# Patient Record
Sex: Male | Born: 1935 | Race: White | Hispanic: No | Marital: Single | Smoking: Former smoker
Health system: Southern US, Community
[De-identification: ages and names within clinical notes are randomized; demographics above are authoritative.]

## PROBLEM LIST (undated history)

## (undated) DIAGNOSIS — I214 Non-ST elevation (NSTEMI) myocardial infarction: Secondary | ICD-10-CM

## (undated) DIAGNOSIS — R351 Nocturia: Secondary | ICD-10-CM

## (undated) DIAGNOSIS — R519 Headache, unspecified: Secondary | ICD-10-CM

## (undated) DIAGNOSIS — M47812 Spondylosis without myelopathy or radiculopathy, cervical region: Secondary | ICD-10-CM

## (undated) DIAGNOSIS — N3281 Overactive bladder: Secondary | ICD-10-CM

## (undated) DIAGNOSIS — G629 Polyneuropathy, unspecified: Secondary | ICD-10-CM

## (undated) DIAGNOSIS — N401 Enlarged prostate with lower urinary tract symptoms: Secondary | ICD-10-CM

## (undated) DIAGNOSIS — F0390 Unspecified dementia without behavioral disturbance: Secondary | ICD-10-CM

## (undated) DIAGNOSIS — R51 Headache: Secondary | ICD-10-CM

## (undated) DIAGNOSIS — I5032 Chronic diastolic (congestive) heart failure: Secondary | ICD-10-CM

## (undated) DIAGNOSIS — M6281 Muscle weakness (generalized): Secondary | ICD-10-CM

## (undated) DIAGNOSIS — I509 Heart failure, unspecified: Secondary | ICD-10-CM

## (undated) DIAGNOSIS — D649 Anemia, unspecified: Secondary | ICD-10-CM

## (undated) DIAGNOSIS — K219 Gastro-esophageal reflux disease without esophagitis: Secondary | ICD-10-CM

## (undated) DIAGNOSIS — G8929 Other chronic pain: Secondary | ICD-10-CM

## (undated) DIAGNOSIS — M479 Spondylosis, unspecified: Secondary | ICD-10-CM

## (undated) DIAGNOSIS — N183 Chronic kidney disease, stage 3 (moderate): Secondary | ICD-10-CM

## (undated) DIAGNOSIS — N138 Other obstructive and reflux uropathy: Secondary | ICD-10-CM

## (undated) DIAGNOSIS — I10 Essential (primary) hypertension: Secondary | ICD-10-CM

## (undated) DIAGNOSIS — I639 Cerebral infarction, unspecified: Secondary | ICD-10-CM

## (undated) DIAGNOSIS — M1990 Unspecified osteoarthritis, unspecified site: Secondary | ICD-10-CM

## (undated) DIAGNOSIS — M549 Dorsalgia, unspecified: Secondary | ICD-10-CM

## (undated) DIAGNOSIS — Z9181 History of falling: Secondary | ICD-10-CM

## (undated) DIAGNOSIS — R32 Unspecified urinary incontinence: Secondary | ICD-10-CM

## (undated) DIAGNOSIS — F32A Depression, unspecified: Secondary | ICD-10-CM

## (undated) DIAGNOSIS — C4491 Basal cell carcinoma of skin, unspecified: Secondary | ICD-10-CM

## (undated) DIAGNOSIS — I872 Venous insufficiency (chronic) (peripheral): Secondary | ICD-10-CM

## (undated) DIAGNOSIS — M25512 Pain in left shoulder: Principal | ICD-10-CM

## (undated) DIAGNOSIS — F329 Major depressive disorder, single episode, unspecified: Secondary | ICD-10-CM

## (undated) DIAGNOSIS — R42 Dizziness and giddiness: Secondary | ICD-10-CM

## (undated) DIAGNOSIS — I4891 Unspecified atrial fibrillation: Secondary | ICD-10-CM

## (undated) DIAGNOSIS — H409 Unspecified glaucoma: Secondary | ICD-10-CM

## (undated) HISTORY — DX: Non-ST elevation (NSTEMI) myocardial infarction: I21.4

## (undated) HISTORY — DX: Polyneuropathy, unspecified: G62.9

## (undated) HISTORY — DX: Benign prostatic hyperplasia with lower urinary tract symptoms: N13.8

## (undated) HISTORY — DX: Spondylosis without myelopathy or radiculopathy, cervical region: M47.812

## (undated) HISTORY — DX: Unspecified osteoarthritis, unspecified site: M19.90

## (undated) HISTORY — DX: Unspecified urinary incontinence: R32

## (undated) HISTORY — DX: Overactive bladder: N32.81

## (undated) HISTORY — DX: Other chronic pain: G89.29

## (undated) HISTORY — PX: CHOLECYSTECTOMY: SHX55

## (undated) HISTORY — PX: TONSILECTOMY, ADENOIDECTOMY, BILATERAL MYRINGOTOMY AND TUBES: SHX2538

## (undated) HISTORY — DX: Depression, unspecified: F32.A

## (undated) HISTORY — DX: Venous insufficiency (chronic) (peripheral): I87.2

## (undated) HISTORY — DX: Headache, unspecified: R51.9

## (undated) HISTORY — DX: Chronic diastolic (congestive) heart failure: I50.32

## (undated) HISTORY — DX: Unspecified glaucoma: H40.9

## (undated) HISTORY — DX: Pain in left shoulder: M25.512

## (undated) HISTORY — DX: Chronic kidney disease, stage 3 (moderate): N18.3

## (undated) HISTORY — DX: Basal cell carcinoma of skin, unspecified: C44.91

## (undated) HISTORY — DX: Anemia, unspecified: D64.9

## (undated) HISTORY — DX: Headache: R51

## (undated) HISTORY — DX: Essential (primary) hypertension: I10

## (undated) HISTORY — DX: Cerebral infarction, unspecified: I63.9

## (undated) HISTORY — DX: Benign prostatic hyperplasia with lower urinary tract symptoms: N40.1

## (undated) HISTORY — DX: Unspecified dementia without behavioral disturbance: F03.90

## (undated) HISTORY — DX: Major depressive disorder, single episode, unspecified: F32.9

---

## 2011-06-20 DIAGNOSIS — G629 Polyneuropathy, unspecified: Secondary | ICD-10-CM

## 2011-06-20 DIAGNOSIS — I639 Cerebral infarction, unspecified: Secondary | ICD-10-CM

## 2011-06-20 DIAGNOSIS — I214 Non-ST elevation (NSTEMI) myocardial infarction: Secondary | ICD-10-CM

## 2011-06-20 DIAGNOSIS — N183 Chronic kidney disease, stage 3 unspecified: Secondary | ICD-10-CM

## 2011-06-20 HISTORY — DX: Cerebral infarction, unspecified: I63.9

## 2011-06-20 HISTORY — DX: Polyneuropathy, unspecified: G62.9

## 2011-06-20 HISTORY — DX: Non-ST elevation (NSTEMI) myocardial infarction: I21.4

## 2011-06-20 HISTORY — DX: Chronic kidney disease, stage 3 unspecified: N18.30

## 2011-06-22 DIAGNOSIS — M19079 Primary osteoarthritis, unspecified ankle and foot: Secondary | ICD-10-CM | POA: Diagnosis not present

## 2011-06-22 DIAGNOSIS — M79609 Pain in unspecified limb: Secondary | ICD-10-CM | POA: Diagnosis not present

## 2011-06-22 DIAGNOSIS — L600 Ingrowing nail: Secondary | ICD-10-CM | POA: Diagnosis not present

## 2011-06-22 DIAGNOSIS — R609 Edema, unspecified: Secondary | ICD-10-CM | POA: Diagnosis not present

## 2011-06-27 DIAGNOSIS — I10 Essential (primary) hypertension: Secondary | ICD-10-CM | POA: Diagnosis not present

## 2011-06-27 DIAGNOSIS — D649 Anemia, unspecified: Secondary | ICD-10-CM | POA: Diagnosis not present

## 2011-06-27 DIAGNOSIS — M1990 Unspecified osteoarthritis, unspecified site: Secondary | ICD-10-CM | POA: Diagnosis not present

## 2011-07-07 DIAGNOSIS — R42 Dizziness and giddiness: Secondary | ICD-10-CM | POA: Diagnosis not present

## 2011-07-07 DIAGNOSIS — M47817 Spondylosis without myelopathy or radiculopathy, lumbosacral region: Secondary | ICD-10-CM | POA: Diagnosis not present

## 2011-07-07 DIAGNOSIS — F17200 Nicotine dependence, unspecified, uncomplicated: Secondary | ICD-10-CM | POA: Diagnosis not present

## 2011-07-07 DIAGNOSIS — I214 Non-ST elevation (NSTEMI) myocardial infarction: Secondary | ICD-10-CM | POA: Diagnosis present

## 2011-07-07 DIAGNOSIS — I2589 Other forms of chronic ischemic heart disease: Secondary | ICD-10-CM | POA: Diagnosis not present

## 2011-07-07 DIAGNOSIS — R9431 Abnormal electrocardiogram [ECG] [EKG]: Secondary | ICD-10-CM | POA: Diagnosis not present

## 2011-07-07 DIAGNOSIS — S0990XA Unspecified injury of head, initial encounter: Secondary | ICD-10-CM | POA: Diagnosis not present

## 2011-07-07 DIAGNOSIS — IMO0002 Reserved for concepts with insufficient information to code with codable children: Secondary | ICD-10-CM | POA: Diagnosis not present

## 2011-07-07 DIAGNOSIS — W19XXXA Unspecified fall, initial encounter: Secondary | ICD-10-CM | POA: Diagnosis not present

## 2011-07-07 DIAGNOSIS — Z66 Do not resuscitate: Secondary | ICD-10-CM | POA: Diagnosis present

## 2011-07-07 DIAGNOSIS — R55 Syncope and collapse: Secondary | ICD-10-CM | POA: Diagnosis not present

## 2011-07-07 DIAGNOSIS — R0602 Shortness of breath: Secondary | ICD-10-CM | POA: Diagnosis not present

## 2011-07-07 DIAGNOSIS — R404 Transient alteration of awareness: Secondary | ICD-10-CM | POA: Diagnosis not present

## 2011-07-07 DIAGNOSIS — I10 Essential (primary) hypertension: Secondary | ICD-10-CM | POA: Diagnosis not present

## 2011-07-10 DIAGNOSIS — R5383 Other fatigue: Secondary | ICD-10-CM | POA: Diagnosis not present

## 2011-07-10 DIAGNOSIS — R0602 Shortness of breath: Secondary | ICD-10-CM | POA: Diagnosis not present

## 2011-07-10 DIAGNOSIS — R05 Cough: Secondary | ICD-10-CM | POA: Diagnosis not present

## 2011-07-10 DIAGNOSIS — I10 Essential (primary) hypertension: Secondary | ICD-10-CM | POA: Diagnosis not present

## 2011-07-10 DIAGNOSIS — R5381 Other malaise: Secondary | ICD-10-CM | POA: Diagnosis not present

## 2011-07-10 DIAGNOSIS — R0789 Other chest pain: Secondary | ICD-10-CM | POA: Diagnosis not present

## 2011-07-10 DIAGNOSIS — R51 Headache: Secondary | ICD-10-CM | POA: Diagnosis not present

## 2011-07-14 DIAGNOSIS — I10 Essential (primary) hypertension: Secondary | ICD-10-CM | POA: Diagnosis not present

## 2011-07-25 DIAGNOSIS — L578 Other skin changes due to chronic exposure to nonionizing radiation: Secondary | ICD-10-CM | POA: Diagnosis not present

## 2011-07-25 DIAGNOSIS — D0420 Carcinoma in situ of skin of unspecified ear and external auricular canal: Secondary | ICD-10-CM | POA: Diagnosis not present

## 2011-07-25 DIAGNOSIS — L570 Actinic keratosis: Secondary | ICD-10-CM | POA: Diagnosis not present

## 2011-07-25 DIAGNOSIS — L738 Other specified follicular disorders: Secondary | ICD-10-CM | POA: Diagnosis not present

## 2011-07-25 DIAGNOSIS — C4441 Basal cell carcinoma of skin of scalp and neck: Secondary | ICD-10-CM | POA: Diagnosis not present

## 2011-07-25 DIAGNOSIS — D485 Neoplasm of uncertain behavior of skin: Secondary | ICD-10-CM | POA: Diagnosis not present

## 2011-07-27 DIAGNOSIS — H353290 Exudative age-related macular degeneration, unspecified eye, stage unspecified: Secondary | ICD-10-CM | POA: Diagnosis not present

## 2011-08-04 DIAGNOSIS — F17200 Nicotine dependence, unspecified, uncomplicated: Secondary | ICD-10-CM | POA: Diagnosis not present

## 2011-08-04 DIAGNOSIS — I10 Essential (primary) hypertension: Secondary | ICD-10-CM | POA: Diagnosis not present

## 2011-08-04 DIAGNOSIS — R42 Dizziness and giddiness: Secondary | ICD-10-CM | POA: Diagnosis not present

## 2011-08-04 DIAGNOSIS — R0602 Shortness of breath: Secondary | ICD-10-CM | POA: Diagnosis not present

## 2011-08-05 DIAGNOSIS — I10 Essential (primary) hypertension: Secondary | ICD-10-CM | POA: Diagnosis not present

## 2011-08-05 DIAGNOSIS — I69998 Other sequelae following unspecified cerebrovascular disease: Secondary | ICD-10-CM | POA: Diagnosis not present

## 2011-08-05 DIAGNOSIS — I509 Heart failure, unspecified: Secondary | ICD-10-CM | POA: Diagnosis not present

## 2011-08-05 DIAGNOSIS — R0989 Other specified symptoms and signs involving the circulatory and respiratory systems: Secondary | ICD-10-CM | POA: Diagnosis not present

## 2011-08-05 DIAGNOSIS — R0602 Shortness of breath: Secondary | ICD-10-CM | POA: Diagnosis not present

## 2011-08-05 DIAGNOSIS — R55 Syncope and collapse: Secondary | ICD-10-CM | POA: Diagnosis not present

## 2011-08-05 DIAGNOSIS — G600 Hereditary motor and sensory neuropathy: Secondary | ICD-10-CM | POA: Diagnosis not present

## 2011-08-05 DIAGNOSIS — Z79899 Other long term (current) drug therapy: Secondary | ICD-10-CM | POA: Diagnosis not present

## 2011-08-05 DIAGNOSIS — M479 Spondylosis, unspecified: Secondary | ICD-10-CM | POA: Diagnosis not present

## 2011-08-05 DIAGNOSIS — I214 Non-ST elevation (NSTEMI) myocardial infarction: Secondary | ICD-10-CM | POA: Diagnosis not present

## 2011-08-05 DIAGNOSIS — Z823 Family history of stroke: Secondary | ICD-10-CM | POA: Diagnosis not present

## 2011-08-05 DIAGNOSIS — I252 Old myocardial infarction: Secondary | ICD-10-CM | POA: Diagnosis not present

## 2011-08-05 DIAGNOSIS — Z9181 History of falling: Secondary | ICD-10-CM | POA: Diagnosis not present

## 2011-08-05 DIAGNOSIS — G459 Transient cerebral ischemic attack, unspecified: Secondary | ICD-10-CM | POA: Diagnosis not present

## 2011-08-05 DIAGNOSIS — R279 Unspecified lack of coordination: Secondary | ICD-10-CM | POA: Diagnosis not present

## 2011-08-05 DIAGNOSIS — R262 Difficulty in walking, not elsewhere classified: Secondary | ICD-10-CM | POA: Diagnosis not present

## 2011-08-05 DIAGNOSIS — R5383 Other fatigue: Secondary | ICD-10-CM | POA: Diagnosis not present

## 2011-08-05 DIAGNOSIS — Z66 Do not resuscitate: Secondary | ICD-10-CM | POA: Diagnosis present

## 2011-08-05 DIAGNOSIS — M6281 Muscle weakness (generalized): Secondary | ICD-10-CM | POA: Diagnosis not present

## 2011-08-05 DIAGNOSIS — Z5189 Encounter for other specified aftercare: Secondary | ICD-10-CM | POA: Diagnosis not present

## 2011-08-05 DIAGNOSIS — R42 Dizziness and giddiness: Secondary | ICD-10-CM | POA: Diagnosis not present

## 2011-08-05 DIAGNOSIS — I6350 Cerebral infarction due to unspecified occlusion or stenosis of unspecified cerebral artery: Secondary | ICD-10-CM | POA: Diagnosis not present

## 2011-08-05 DIAGNOSIS — Z85828 Personal history of other malignant neoplasm of skin: Secondary | ICD-10-CM | POA: Diagnosis not present

## 2011-08-05 DIAGNOSIS — Z7982 Long term (current) use of aspirin: Secondary | ICD-10-CM | POA: Diagnosis not present

## 2011-08-05 DIAGNOSIS — I69993 Ataxia following unspecified cerebrovascular disease: Secondary | ICD-10-CM | POA: Diagnosis not present

## 2011-08-05 DIAGNOSIS — F17200 Nicotine dependence, unspecified, uncomplicated: Secondary | ICD-10-CM | POA: Diagnosis present

## 2011-08-05 DIAGNOSIS — F411 Generalized anxiety disorder: Secondary | ICD-10-CM | POA: Diagnosis present

## 2011-08-05 DIAGNOSIS — G608 Other hereditary and idiopathic neuropathies: Secondary | ICD-10-CM | POA: Diagnosis present

## 2011-08-05 DIAGNOSIS — R5381 Other malaise: Secondary | ICD-10-CM | POA: Diagnosis not present

## 2011-08-08 DIAGNOSIS — R279 Unspecified lack of coordination: Secondary | ICD-10-CM | POA: Diagnosis not present

## 2011-08-08 DIAGNOSIS — G459 Transient cerebral ischemic attack, unspecified: Secondary | ICD-10-CM | POA: Diagnosis not present

## 2011-08-08 DIAGNOSIS — G600 Hereditary motor and sensory neuropathy: Secondary | ICD-10-CM | POA: Diagnosis not present

## 2011-08-08 DIAGNOSIS — R5381 Other malaise: Secondary | ICD-10-CM | POA: Diagnosis not present

## 2011-08-08 DIAGNOSIS — I69993 Ataxia following unspecified cerebrovascular disease: Secondary | ICD-10-CM | POA: Diagnosis not present

## 2011-08-08 DIAGNOSIS — Z5189 Encounter for other specified aftercare: Secondary | ICD-10-CM | POA: Diagnosis not present

## 2011-08-08 DIAGNOSIS — Z9181 History of falling: Secondary | ICD-10-CM | POA: Diagnosis not present

## 2011-08-08 DIAGNOSIS — I10 Essential (primary) hypertension: Secondary | ICD-10-CM | POA: Diagnosis not present

## 2011-08-08 DIAGNOSIS — M6281 Muscle weakness (generalized): Secondary | ICD-10-CM | POA: Diagnosis not present

## 2011-08-08 DIAGNOSIS — R262 Difficulty in walking, not elsewhere classified: Secondary | ICD-10-CM | POA: Diagnosis not present

## 2011-08-08 DIAGNOSIS — G608 Other hereditary and idiopathic neuropathies: Secondary | ICD-10-CM | POA: Diagnosis not present

## 2011-08-16 DIAGNOSIS — G459 Transient cerebral ischemic attack, unspecified: Secondary | ICD-10-CM | POA: Diagnosis not present

## 2011-08-16 DIAGNOSIS — G600 Hereditary motor and sensory neuropathy: Secondary | ICD-10-CM | POA: Diagnosis not present

## 2011-08-16 DIAGNOSIS — I10 Essential (primary) hypertension: Secondary | ICD-10-CM | POA: Diagnosis not present

## 2011-08-17 DIAGNOSIS — G608 Other hereditary and idiopathic neuropathies: Secondary | ICD-10-CM | POA: Diagnosis not present

## 2011-08-17 DIAGNOSIS — I10 Essential (primary) hypertension: Secondary | ICD-10-CM | POA: Diagnosis not present

## 2011-08-17 DIAGNOSIS — I69993 Ataxia following unspecified cerebrovascular disease: Secondary | ICD-10-CM | POA: Diagnosis not present

## 2011-08-21 DIAGNOSIS — G459 Transient cerebral ischemic attack, unspecified: Secondary | ICD-10-CM | POA: Diagnosis not present

## 2011-08-21 DIAGNOSIS — I10 Essential (primary) hypertension: Secondary | ICD-10-CM | POA: Diagnosis not present

## 2011-08-21 DIAGNOSIS — G600 Hereditary motor and sensory neuropathy: Secondary | ICD-10-CM | POA: Diagnosis not present

## 2011-08-29 DIAGNOSIS — G459 Transient cerebral ischemic attack, unspecified: Secondary | ICD-10-CM | POA: Diagnosis not present

## 2011-08-29 DIAGNOSIS — I10 Essential (primary) hypertension: Secondary | ICD-10-CM | POA: Diagnosis not present

## 2011-08-29 DIAGNOSIS — G600 Hereditary motor and sensory neuropathy: Secondary | ICD-10-CM | POA: Diagnosis not present

## 2011-09-06 DIAGNOSIS — G608 Other hereditary and idiopathic neuropathies: Secondary | ICD-10-CM | POA: Diagnosis not present

## 2011-09-06 DIAGNOSIS — I10 Essential (primary) hypertension: Secondary | ICD-10-CM | POA: Diagnosis not present

## 2011-09-06 DIAGNOSIS — I69993 Ataxia following unspecified cerebrovascular disease: Secondary | ICD-10-CM | POA: Diagnosis not present

## 2011-09-07 DIAGNOSIS — R404 Transient alteration of awareness: Secondary | ICD-10-CM | POA: Diagnosis not present

## 2011-09-07 DIAGNOSIS — I10 Essential (primary) hypertension: Secondary | ICD-10-CM | POA: Diagnosis not present

## 2011-09-07 DIAGNOSIS — R0602 Shortness of breath: Secondary | ICD-10-CM | POA: Diagnosis not present

## 2011-09-07 DIAGNOSIS — F411 Generalized anxiety disorder: Secondary | ICD-10-CM | POA: Diagnosis not present

## 2011-09-07 DIAGNOSIS — I252 Old myocardial infarction: Secondary | ICD-10-CM | POA: Diagnosis not present

## 2011-09-07 DIAGNOSIS — Z85820 Personal history of malignant melanoma of skin: Secondary | ICD-10-CM | POA: Diagnosis not present

## 2011-09-08 DIAGNOSIS — I10 Essential (primary) hypertension: Secondary | ICD-10-CM | POA: Diagnosis not present

## 2011-09-08 DIAGNOSIS — G608 Other hereditary and idiopathic neuropathies: Secondary | ICD-10-CM | POA: Diagnosis not present

## 2011-09-08 DIAGNOSIS — I69993 Ataxia following unspecified cerebrovascular disease: Secondary | ICD-10-CM | POA: Diagnosis not present

## 2011-09-11 DIAGNOSIS — I69993 Ataxia following unspecified cerebrovascular disease: Secondary | ICD-10-CM | POA: Diagnosis not present

## 2011-09-11 DIAGNOSIS — G608 Other hereditary and idiopathic neuropathies: Secondary | ICD-10-CM | POA: Diagnosis not present

## 2011-09-11 DIAGNOSIS — I10 Essential (primary) hypertension: Secondary | ICD-10-CM | POA: Diagnosis not present

## 2011-09-12 DIAGNOSIS — G608 Other hereditary and idiopathic neuropathies: Secondary | ICD-10-CM | POA: Diagnosis not present

## 2011-09-12 DIAGNOSIS — I69993 Ataxia following unspecified cerebrovascular disease: Secondary | ICD-10-CM | POA: Diagnosis not present

## 2011-09-12 DIAGNOSIS — I10 Essential (primary) hypertension: Secondary | ICD-10-CM | POA: Diagnosis not present

## 2011-09-14 DIAGNOSIS — G608 Other hereditary and idiopathic neuropathies: Secondary | ICD-10-CM | POA: Diagnosis not present

## 2011-09-14 DIAGNOSIS — I10 Essential (primary) hypertension: Secondary | ICD-10-CM | POA: Diagnosis not present

## 2011-09-14 DIAGNOSIS — I69993 Ataxia following unspecified cerebrovascular disease: Secondary | ICD-10-CM | POA: Diagnosis not present

## 2011-09-15 DIAGNOSIS — I10 Essential (primary) hypertension: Secondary | ICD-10-CM | POA: Diagnosis not present

## 2011-09-15 DIAGNOSIS — I69993 Ataxia following unspecified cerebrovascular disease: Secondary | ICD-10-CM | POA: Diagnosis not present

## 2011-09-15 DIAGNOSIS — G608 Other hereditary and idiopathic neuropathies: Secondary | ICD-10-CM | POA: Diagnosis not present

## 2011-09-17 DIAGNOSIS — I10 Essential (primary) hypertension: Secondary | ICD-10-CM | POA: Diagnosis not present

## 2011-09-17 DIAGNOSIS — R079 Chest pain, unspecified: Secondary | ICD-10-CM | POA: Diagnosis not present

## 2011-09-17 DIAGNOSIS — F411 Generalized anxiety disorder: Secondary | ICD-10-CM | POA: Diagnosis not present

## 2011-09-17 DIAGNOSIS — I252 Old myocardial infarction: Secondary | ICD-10-CM | POA: Diagnosis not present

## 2011-09-17 DIAGNOSIS — R0602 Shortness of breath: Secondary | ICD-10-CM | POA: Diagnosis not present

## 2011-09-19 DIAGNOSIS — I69993 Ataxia following unspecified cerebrovascular disease: Secondary | ICD-10-CM | POA: Diagnosis not present

## 2011-09-19 DIAGNOSIS — G608 Other hereditary and idiopathic neuropathies: Secondary | ICD-10-CM | POA: Diagnosis not present

## 2011-09-19 DIAGNOSIS — I10 Essential (primary) hypertension: Secondary | ICD-10-CM | POA: Diagnosis not present

## 2011-09-22 DIAGNOSIS — F410 Panic disorder [episodic paroxysmal anxiety] without agoraphobia: Secondary | ICD-10-CM | POA: Diagnosis not present

## 2011-09-22 DIAGNOSIS — R0609 Other forms of dyspnea: Secondary | ICD-10-CM | POA: Diagnosis not present

## 2011-09-22 DIAGNOSIS — R0989 Other specified symptoms and signs involving the circulatory and respiratory systems: Secondary | ICD-10-CM | POA: Diagnosis not present

## 2011-09-22 DIAGNOSIS — I10 Essential (primary) hypertension: Secondary | ICD-10-CM | POA: Diagnosis not present

## 2011-09-22 DIAGNOSIS — I2510 Atherosclerotic heart disease of native coronary artery without angina pectoris: Secondary | ICD-10-CM | POA: Diagnosis not present

## 2011-09-22 DIAGNOSIS — F17200 Nicotine dependence, unspecified, uncomplicated: Secondary | ICD-10-CM | POA: Diagnosis not present

## 2011-09-22 DIAGNOSIS — R079 Chest pain, unspecified: Secondary | ICD-10-CM | POA: Diagnosis not present

## 2011-09-22 DIAGNOSIS — F411 Generalized anxiety disorder: Secondary | ICD-10-CM | POA: Diagnosis not present

## 2011-09-26 DIAGNOSIS — I69993 Ataxia following unspecified cerebrovascular disease: Secondary | ICD-10-CM | POA: Diagnosis not present

## 2011-09-26 DIAGNOSIS — G608 Other hereditary and idiopathic neuropathies: Secondary | ICD-10-CM | POA: Diagnosis not present

## 2011-09-26 DIAGNOSIS — I10 Essential (primary) hypertension: Secondary | ICD-10-CM | POA: Diagnosis not present

## 2011-09-27 DIAGNOSIS — I69993 Ataxia following unspecified cerebrovascular disease: Secondary | ICD-10-CM | POA: Diagnosis not present

## 2011-09-27 DIAGNOSIS — I10 Essential (primary) hypertension: Secondary | ICD-10-CM | POA: Diagnosis not present

## 2011-09-27 DIAGNOSIS — G608 Other hereditary and idiopathic neuropathies: Secondary | ICD-10-CM | POA: Diagnosis not present

## 2011-09-28 DIAGNOSIS — G608 Other hereditary and idiopathic neuropathies: Secondary | ICD-10-CM | POA: Diagnosis not present

## 2011-09-28 DIAGNOSIS — I69993 Ataxia following unspecified cerebrovascular disease: Secondary | ICD-10-CM | POA: Diagnosis not present

## 2011-09-28 DIAGNOSIS — I10 Essential (primary) hypertension: Secondary | ICD-10-CM | POA: Diagnosis not present

## 2011-09-29 DIAGNOSIS — I10 Essential (primary) hypertension: Secondary | ICD-10-CM | POA: Diagnosis not present

## 2011-09-29 DIAGNOSIS — I69993 Ataxia following unspecified cerebrovascular disease: Secondary | ICD-10-CM | POA: Diagnosis not present

## 2011-09-29 DIAGNOSIS — G608 Other hereditary and idiopathic neuropathies: Secondary | ICD-10-CM | POA: Diagnosis not present

## 2011-10-03 DIAGNOSIS — G608 Other hereditary and idiopathic neuropathies: Secondary | ICD-10-CM | POA: Diagnosis not present

## 2011-10-03 DIAGNOSIS — I69993 Ataxia following unspecified cerebrovascular disease: Secondary | ICD-10-CM | POA: Diagnosis not present

## 2011-10-03 DIAGNOSIS — I10 Essential (primary) hypertension: Secondary | ICD-10-CM | POA: Diagnosis not present

## 2011-10-10 DIAGNOSIS — I69993 Ataxia following unspecified cerebrovascular disease: Secondary | ICD-10-CM | POA: Diagnosis not present

## 2011-10-10 DIAGNOSIS — G608 Other hereditary and idiopathic neuropathies: Secondary | ICD-10-CM | POA: Diagnosis not present

## 2011-10-10 DIAGNOSIS — I10 Essential (primary) hypertension: Secondary | ICD-10-CM | POA: Diagnosis not present

## 2011-10-19 DIAGNOSIS — I679 Cerebrovascular disease, unspecified: Secondary | ICD-10-CM | POA: Diagnosis not present

## 2011-10-19 DIAGNOSIS — I10 Essential (primary) hypertension: Secondary | ICD-10-CM | POA: Diagnosis not present

## 2011-11-21 DIAGNOSIS — L03039 Cellulitis of unspecified toe: Secondary | ICD-10-CM | POA: Diagnosis not present

## 2011-11-21 DIAGNOSIS — L600 Ingrowing nail: Secondary | ICD-10-CM | POA: Diagnosis not present

## 2011-11-21 DIAGNOSIS — M79609 Pain in unspecified limb: Secondary | ICD-10-CM | POA: Diagnosis not present

## 2011-11-21 DIAGNOSIS — L02619 Cutaneous abscess of unspecified foot: Secondary | ICD-10-CM | POA: Diagnosis not present

## 2011-11-27 DIAGNOSIS — I10 Essential (primary) hypertension: Secondary | ICD-10-CM | POA: Diagnosis not present

## 2011-11-27 DIAGNOSIS — H6120 Impacted cerumen, unspecified ear: Secondary | ICD-10-CM | POA: Diagnosis not present

## 2011-11-27 DIAGNOSIS — I679 Cerebrovascular disease, unspecified: Secondary | ICD-10-CM | POA: Diagnosis not present

## 2011-11-27 DIAGNOSIS — G609 Hereditary and idiopathic neuropathy, unspecified: Secondary | ICD-10-CM | POA: Diagnosis not present

## 2012-01-02 DIAGNOSIS — W19XXXA Unspecified fall, initial encounter: Secondary | ICD-10-CM | POA: Diagnosis not present

## 2012-01-02 DIAGNOSIS — M47812 Spondylosis without myelopathy or radiculopathy, cervical region: Secondary | ICD-10-CM | POA: Diagnosis not present

## 2012-01-02 DIAGNOSIS — S1093XA Contusion of unspecified part of neck, initial encounter: Secondary | ICD-10-CM | POA: Diagnosis not present

## 2012-01-02 DIAGNOSIS — M542 Cervicalgia: Secondary | ICD-10-CM | POA: Diagnosis not present

## 2012-01-02 DIAGNOSIS — S0993XA Unspecified injury of face, initial encounter: Secondary | ICD-10-CM | POA: Diagnosis not present

## 2012-01-02 DIAGNOSIS — S0100XA Unspecified open wound of scalp, initial encounter: Secondary | ICD-10-CM | POA: Diagnosis not present

## 2012-01-02 DIAGNOSIS — T1490XA Injury, unspecified, initial encounter: Secondary | ICD-10-CM | POA: Diagnosis not present

## 2012-01-02 DIAGNOSIS — S0990XA Unspecified injury of head, initial encounter: Secondary | ICD-10-CM | POA: Diagnosis not present

## 2012-01-02 DIAGNOSIS — S0083XA Contusion of other part of head, initial encounter: Secondary | ICD-10-CM | POA: Diagnosis not present

## 2012-01-11 DIAGNOSIS — S1093XA Contusion of unspecified part of neck, initial encounter: Secondary | ICD-10-CM | POA: Diagnosis not present

## 2012-01-11 DIAGNOSIS — S0003XA Contusion of scalp, initial encounter: Secondary | ICD-10-CM | POA: Diagnosis not present

## 2012-01-11 DIAGNOSIS — I679 Cerebrovascular disease, unspecified: Secondary | ICD-10-CM | POA: Diagnosis not present

## 2012-03-18 DIAGNOSIS — M1990 Unspecified osteoarthritis, unspecified site: Secondary | ICD-10-CM | POA: Diagnosis not present

## 2012-03-18 DIAGNOSIS — I10 Essential (primary) hypertension: Secondary | ICD-10-CM | POA: Diagnosis not present

## 2012-03-18 DIAGNOSIS — G609 Hereditary and idiopathic neuropathy, unspecified: Secondary | ICD-10-CM | POA: Diagnosis not present

## 2012-03-18 DIAGNOSIS — I679 Cerebrovascular disease, unspecified: Secondary | ICD-10-CM | POA: Diagnosis not present

## 2012-04-08 DIAGNOSIS — L600 Ingrowing nail: Secondary | ICD-10-CM | POA: Diagnosis not present

## 2012-04-08 DIAGNOSIS — M79609 Pain in unspecified limb: Secondary | ICD-10-CM | POA: Diagnosis not present

## 2012-04-08 DIAGNOSIS — M19079 Primary osteoarthritis, unspecified ankle and foot: Secondary | ICD-10-CM | POA: Diagnosis not present

## 2012-04-08 DIAGNOSIS — M2040 Other hammer toe(s) (acquired), unspecified foot: Secondary | ICD-10-CM | POA: Diagnosis not present

## 2012-05-27 DIAGNOSIS — M19079 Primary osteoarthritis, unspecified ankle and foot: Secondary | ICD-10-CM | POA: Diagnosis not present

## 2012-05-27 DIAGNOSIS — M79609 Pain in unspecified limb: Secondary | ICD-10-CM | POA: Diagnosis not present

## 2012-05-27 DIAGNOSIS — L600 Ingrowing nail: Secondary | ICD-10-CM | POA: Diagnosis not present

## 2012-05-27 DIAGNOSIS — R609 Edema, unspecified: Secondary | ICD-10-CM | POA: Diagnosis not present

## 2012-06-03 DIAGNOSIS — G609 Hereditary and idiopathic neuropathy, unspecified: Secondary | ICD-10-CM | POA: Diagnosis not present

## 2012-06-03 DIAGNOSIS — Z23 Encounter for immunization: Secondary | ICD-10-CM | POA: Diagnosis not present

## 2012-06-03 DIAGNOSIS — M1990 Unspecified osteoarthritis, unspecified site: Secondary | ICD-10-CM | POA: Diagnosis not present

## 2012-06-03 DIAGNOSIS — I10 Essential (primary) hypertension: Secondary | ICD-10-CM | POA: Diagnosis not present

## 2012-06-03 DIAGNOSIS — I679 Cerebrovascular disease, unspecified: Secondary | ICD-10-CM | POA: Diagnosis not present

## 2012-07-29 DIAGNOSIS — R609 Edema, unspecified: Secondary | ICD-10-CM | POA: Diagnosis not present

## 2012-07-29 DIAGNOSIS — M79609 Pain in unspecified limb: Secondary | ICD-10-CM | POA: Diagnosis not present

## 2012-07-29 DIAGNOSIS — L600 Ingrowing nail: Secondary | ICD-10-CM | POA: Diagnosis not present

## 2012-07-29 DIAGNOSIS — M19079 Primary osteoarthritis, unspecified ankle and foot: Secondary | ICD-10-CM | POA: Diagnosis not present

## 2012-08-26 DIAGNOSIS — I679 Cerebrovascular disease, unspecified: Secondary | ICD-10-CM | POA: Diagnosis not present

## 2012-08-26 DIAGNOSIS — G609 Hereditary and idiopathic neuropathy, unspecified: Secondary | ICD-10-CM | POA: Diagnosis not present

## 2012-08-26 DIAGNOSIS — I10 Essential (primary) hypertension: Secondary | ICD-10-CM | POA: Diagnosis not present

## 2012-08-26 DIAGNOSIS — M1990 Unspecified osteoarthritis, unspecified site: Secondary | ICD-10-CM | POA: Diagnosis not present

## 2012-09-09 DIAGNOSIS — M19079 Primary osteoarthritis, unspecified ankle and foot: Secondary | ICD-10-CM | POA: Diagnosis not present

## 2012-09-09 DIAGNOSIS — M79609 Pain in unspecified limb: Secondary | ICD-10-CM | POA: Diagnosis not present

## 2012-09-09 DIAGNOSIS — M2040 Other hammer toe(s) (acquired), unspecified foot: Secondary | ICD-10-CM | POA: Diagnosis not present

## 2012-09-09 DIAGNOSIS — L600 Ingrowing nail: Secondary | ICD-10-CM | POA: Diagnosis not present

## 2012-10-31 DIAGNOSIS — Z5189 Encounter for other specified aftercare: Secondary | ICD-10-CM | POA: Diagnosis not present

## 2012-10-31 DIAGNOSIS — I498 Other specified cardiac arrhythmias: Secondary | ICD-10-CM | POA: Diagnosis not present

## 2012-10-31 DIAGNOSIS — R5383 Other fatigue: Secondary | ICD-10-CM | POA: Diagnosis not present

## 2012-10-31 DIAGNOSIS — I6789 Other cerebrovascular disease: Secondary | ICD-10-CM | POA: Diagnosis not present

## 2012-10-31 DIAGNOSIS — I10 Essential (primary) hypertension: Secondary | ICD-10-CM | POA: Diagnosis not present

## 2012-10-31 DIAGNOSIS — F29 Unspecified psychosis not due to a substance or known physiological condition: Secondary | ICD-10-CM | POA: Diagnosis present

## 2012-10-31 DIAGNOSIS — M6281 Muscle weakness (generalized): Secondary | ICD-10-CM | POA: Diagnosis not present

## 2012-10-31 DIAGNOSIS — G589 Mononeuropathy, unspecified: Secondary | ICD-10-CM | POA: Diagnosis not present

## 2012-10-31 DIAGNOSIS — Z7982 Long term (current) use of aspirin: Secondary | ICD-10-CM | POA: Diagnosis not present

## 2012-10-31 DIAGNOSIS — Z8673 Personal history of transient ischemic attack (TIA), and cerebral infarction without residual deficits: Secondary | ICD-10-CM | POA: Diagnosis not present

## 2012-10-31 DIAGNOSIS — R55 Syncope and collapse: Secondary | ICD-10-CM | POA: Diagnosis not present

## 2012-10-31 DIAGNOSIS — F411 Generalized anxiety disorder: Secondary | ICD-10-CM | POA: Diagnosis not present

## 2012-10-31 DIAGNOSIS — R42 Dizziness and giddiness: Secondary | ICD-10-CM | POA: Diagnosis not present

## 2012-10-31 DIAGNOSIS — I252 Old myocardial infarction: Secondary | ICD-10-CM | POA: Diagnosis not present

## 2012-10-31 DIAGNOSIS — R404 Transient alteration of awareness: Secondary | ICD-10-CM | POA: Diagnosis not present

## 2012-10-31 DIAGNOSIS — R51 Headache: Secondary | ICD-10-CM | POA: Diagnosis not present

## 2012-11-04 DIAGNOSIS — G609 Hereditary and idiopathic neuropathy, unspecified: Secondary | ICD-10-CM | POA: Diagnosis not present

## 2012-11-04 DIAGNOSIS — R55 Syncope and collapse: Secondary | ICD-10-CM | POA: Diagnosis not present

## 2012-11-04 DIAGNOSIS — I10 Essential (primary) hypertension: Secondary | ICD-10-CM | POA: Diagnosis not present

## 2012-11-04 DIAGNOSIS — F411 Generalized anxiety disorder: Secondary | ICD-10-CM | POA: Diagnosis not present

## 2012-11-04 DIAGNOSIS — M6281 Muscle weakness (generalized): Secondary | ICD-10-CM | POA: Diagnosis not present

## 2012-11-04 DIAGNOSIS — Z5189 Encounter for other specified aftercare: Secondary | ICD-10-CM | POA: Diagnosis not present

## 2012-11-05 DIAGNOSIS — R55 Syncope and collapse: Secondary | ICD-10-CM | POA: Diagnosis not present

## 2012-11-05 DIAGNOSIS — G609 Hereditary and idiopathic neuropathy, unspecified: Secondary | ICD-10-CM | POA: Diagnosis not present

## 2012-11-14 DIAGNOSIS — R55 Syncope and collapse: Secondary | ICD-10-CM | POA: Diagnosis not present

## 2012-11-14 DIAGNOSIS — F411 Generalized anxiety disorder: Secondary | ICD-10-CM | POA: Diagnosis not present

## 2012-11-14 DIAGNOSIS — I10 Essential (primary) hypertension: Secondary | ICD-10-CM | POA: Diagnosis not present

## 2012-11-14 DIAGNOSIS — G609 Hereditary and idiopathic neuropathy, unspecified: Secondary | ICD-10-CM | POA: Diagnosis not present

## 2012-11-23 DIAGNOSIS — R55 Syncope and collapse: Secondary | ICD-10-CM | POA: Diagnosis not present

## 2012-11-23 DIAGNOSIS — F411 Generalized anxiety disorder: Secondary | ICD-10-CM | POA: Diagnosis not present

## 2012-11-23 DIAGNOSIS — G609 Hereditary and idiopathic neuropathy, unspecified: Secondary | ICD-10-CM | POA: Diagnosis not present

## 2012-11-23 DIAGNOSIS — I10 Essential (primary) hypertension: Secondary | ICD-10-CM | POA: Diagnosis not present

## 2012-12-07 DIAGNOSIS — G609 Hereditary and idiopathic neuropathy, unspecified: Secondary | ICD-10-CM | POA: Diagnosis not present

## 2012-12-07 DIAGNOSIS — F411 Generalized anxiety disorder: Secondary | ICD-10-CM | POA: Diagnosis not present

## 2012-12-07 DIAGNOSIS — I10 Essential (primary) hypertension: Secondary | ICD-10-CM | POA: Diagnosis not present

## 2012-12-07 DIAGNOSIS — R5383 Other fatigue: Secondary | ICD-10-CM | POA: Diagnosis not present

## 2012-12-07 DIAGNOSIS — Z8673 Personal history of transient ischemic attack (TIA), and cerebral infarction without residual deficits: Secondary | ICD-10-CM | POA: Diagnosis not present

## 2012-12-07 DIAGNOSIS — Z9181 History of falling: Secondary | ICD-10-CM | POA: Diagnosis not present

## 2012-12-08 DIAGNOSIS — R0602 Shortness of breath: Secondary | ICD-10-CM | POA: Diagnosis not present

## 2012-12-08 DIAGNOSIS — R404 Transient alteration of awareness: Secondary | ICD-10-CM | POA: Diagnosis not present

## 2012-12-08 DIAGNOSIS — R110 Nausea: Secondary | ICD-10-CM | POA: Diagnosis not present

## 2012-12-08 DIAGNOSIS — R0789 Other chest pain: Secondary | ICD-10-CM | POA: Diagnosis not present

## 2012-12-08 DIAGNOSIS — R42 Dizziness and giddiness: Secondary | ICD-10-CM | POA: Diagnosis not present

## 2012-12-09 DIAGNOSIS — F17200 Nicotine dependence, unspecified, uncomplicated: Secondary | ICD-10-CM | POA: Diagnosis not present

## 2012-12-09 DIAGNOSIS — M1990 Unspecified osteoarthritis, unspecified site: Secondary | ICD-10-CM | POA: Diagnosis not present

## 2012-12-09 DIAGNOSIS — I10 Essential (primary) hypertension: Secondary | ICD-10-CM | POA: Diagnosis not present

## 2012-12-09 DIAGNOSIS — M25539 Pain in unspecified wrist: Secondary | ICD-10-CM | POA: Diagnosis not present

## 2012-12-10 DIAGNOSIS — F411 Generalized anxiety disorder: Secondary | ICD-10-CM | POA: Diagnosis not present

## 2012-12-10 DIAGNOSIS — Z8673 Personal history of transient ischemic attack (TIA), and cerebral infarction without residual deficits: Secondary | ICD-10-CM | POA: Diagnosis not present

## 2012-12-10 DIAGNOSIS — R5381 Other malaise: Secondary | ICD-10-CM | POA: Diagnosis not present

## 2012-12-10 DIAGNOSIS — Z9181 History of falling: Secondary | ICD-10-CM | POA: Diagnosis not present

## 2012-12-10 DIAGNOSIS — I10 Essential (primary) hypertension: Secondary | ICD-10-CM | POA: Diagnosis not present

## 2012-12-10 DIAGNOSIS — G609 Hereditary and idiopathic neuropathy, unspecified: Secondary | ICD-10-CM | POA: Diagnosis not present

## 2012-12-12 DIAGNOSIS — R5381 Other malaise: Secondary | ICD-10-CM | POA: Diagnosis not present

## 2012-12-12 DIAGNOSIS — R55 Syncope and collapse: Secondary | ICD-10-CM | POA: Diagnosis not present

## 2012-12-12 DIAGNOSIS — I10 Essential (primary) hypertension: Secondary | ICD-10-CM | POA: Diagnosis not present

## 2012-12-12 DIAGNOSIS — G9009 Other idiopathic peripheral autonomic neuropathy: Secondary | ICD-10-CM | POA: Diagnosis not present

## 2012-12-12 DIAGNOSIS — IMO0002 Reserved for concepts with insufficient information to code with codable children: Secondary | ICD-10-CM | POA: Diagnosis not present

## 2012-12-12 DIAGNOSIS — R404 Transient alteration of awareness: Secondary | ICD-10-CM | POA: Diagnosis not present

## 2012-12-12 DIAGNOSIS — M25539 Pain in unspecified wrist: Secondary | ICD-10-CM | POA: Diagnosis not present

## 2012-12-12 DIAGNOSIS — S51809A Unspecified open wound of unspecified forearm, initial encounter: Secondary | ICD-10-CM | POA: Diagnosis present

## 2012-12-12 DIAGNOSIS — R059 Cough, unspecified: Secondary | ICD-10-CM | POA: Diagnosis not present

## 2012-12-12 DIAGNOSIS — I951 Orthostatic hypotension: Secondary | ICD-10-CM | POA: Diagnosis present

## 2012-12-12 DIAGNOSIS — W19XXXA Unspecified fall, initial encounter: Secondary | ICD-10-CM | POA: Diagnosis not present

## 2012-12-12 DIAGNOSIS — R0602 Shortness of breath: Secondary | ICD-10-CM | POA: Diagnosis not present

## 2012-12-12 DIAGNOSIS — M545 Low back pain: Secondary | ICD-10-CM | POA: Diagnosis not present

## 2012-12-12 DIAGNOSIS — T1490XA Injury, unspecified, initial encounter: Secondary | ICD-10-CM | POA: Diagnosis not present

## 2012-12-12 DIAGNOSIS — R110 Nausea: Secondary | ICD-10-CM | POA: Diagnosis not present

## 2012-12-12 DIAGNOSIS — H8110 Benign paroxysmal vertigo, unspecified ear: Secondary | ICD-10-CM | POA: Diagnosis not present

## 2012-12-12 DIAGNOSIS — M5137 Other intervertebral disc degeneration, lumbosacral region: Secondary | ICD-10-CM | POA: Diagnosis not present

## 2012-12-12 DIAGNOSIS — M47817 Spondylosis without myelopathy or radiculopathy, lumbosacral region: Secondary | ICD-10-CM | POA: Diagnosis not present

## 2012-12-12 DIAGNOSIS — R05 Cough: Secondary | ICD-10-CM | POA: Diagnosis not present

## 2012-12-12 DIAGNOSIS — Z5189 Encounter for other specified aftercare: Secondary | ICD-10-CM | POA: Diagnosis not present

## 2012-12-12 DIAGNOSIS — S59919A Unspecified injury of unspecified forearm, initial encounter: Secondary | ICD-10-CM | POA: Diagnosis not present

## 2012-12-12 DIAGNOSIS — S50909A Unspecified superficial injury of unspecified elbow, initial encounter: Secondary | ICD-10-CM | POA: Diagnosis not present

## 2012-12-12 DIAGNOSIS — G589 Mononeuropathy, unspecified: Secondary | ICD-10-CM | POA: Diagnosis not present

## 2012-12-12 DIAGNOSIS — R42 Dizziness and giddiness: Secondary | ICD-10-CM | POA: Diagnosis not present

## 2012-12-12 DIAGNOSIS — E860 Dehydration: Secondary | ICD-10-CM | POA: Diagnosis not present

## 2012-12-12 DIAGNOSIS — I6789 Other cerebrovascular disease: Secondary | ICD-10-CM | POA: Diagnosis not present

## 2012-12-12 DIAGNOSIS — S51009A Unspecified open wound of unspecified elbow, initial encounter: Secondary | ICD-10-CM | POA: Diagnosis present

## 2012-12-12 DIAGNOSIS — F329 Major depressive disorder, single episode, unspecified: Secondary | ICD-10-CM | POA: Diagnosis present

## 2012-12-15 DIAGNOSIS — W19XXXA Unspecified fall, initial encounter: Secondary | ICD-10-CM | POA: Diagnosis not present

## 2012-12-15 DIAGNOSIS — R42 Dizziness and giddiness: Secondary | ICD-10-CM | POA: Diagnosis not present

## 2012-12-15 DIAGNOSIS — S51809A Unspecified open wound of unspecified forearm, initial encounter: Secondary | ICD-10-CM | POA: Diagnosis not present

## 2012-12-18 DIAGNOSIS — H8110 Benign paroxysmal vertigo, unspecified ear: Secondary | ICD-10-CM | POA: Diagnosis not present

## 2012-12-18 DIAGNOSIS — G9009 Other idiopathic peripheral autonomic neuropathy: Secondary | ICD-10-CM | POA: Diagnosis not present

## 2012-12-18 DIAGNOSIS — M545 Low back pain, unspecified: Secondary | ICD-10-CM | POA: Diagnosis not present

## 2012-12-18 DIAGNOSIS — E860 Dehydration: Secondary | ICD-10-CM | POA: Diagnosis not present

## 2012-12-18 DIAGNOSIS — I10 Essential (primary) hypertension: Secondary | ICD-10-CM | POA: Diagnosis not present

## 2012-12-18 DIAGNOSIS — G3184 Mild cognitive impairment, so stated: Secondary | ICD-10-CM | POA: Diagnosis not present

## 2012-12-18 DIAGNOSIS — M5137 Other intervertebral disc degeneration, lumbosacral region: Secondary | ICD-10-CM | POA: Diagnosis not present

## 2012-12-18 DIAGNOSIS — S51809A Unspecified open wound of unspecified forearm, initial encounter: Secondary | ICD-10-CM | POA: Diagnosis not present

## 2012-12-18 DIAGNOSIS — G589 Mononeuropathy, unspecified: Secondary | ICD-10-CM | POA: Diagnosis not present

## 2012-12-18 DIAGNOSIS — S50919A Unspecified superficial injury of unspecified forearm, initial encounter: Secondary | ICD-10-CM | POA: Diagnosis not present

## 2012-12-18 DIAGNOSIS — R42 Dizziness and giddiness: Secondary | ICD-10-CM | POA: Diagnosis not present

## 2012-12-18 DIAGNOSIS — Z5189 Encounter for other specified aftercare: Secondary | ICD-10-CM | POA: Diagnosis not present

## 2012-12-18 DIAGNOSIS — M1990 Unspecified osteoarthritis, unspecified site: Secondary | ICD-10-CM | POA: Diagnosis not present

## 2012-12-18 DIAGNOSIS — F329 Major depressive disorder, single episode, unspecified: Secondary | ICD-10-CM | POA: Diagnosis not present

## 2012-12-18 DIAGNOSIS — G459 Transient cerebral ischemic attack, unspecified: Secondary | ICD-10-CM | POA: Diagnosis not present

## 2012-12-18 DIAGNOSIS — R609 Edema, unspecified: Secondary | ICD-10-CM | POA: Diagnosis not present

## 2012-12-18 DIAGNOSIS — G609 Hereditary and idiopathic neuropathy, unspecified: Secondary | ICD-10-CM | POA: Diagnosis not present

## 2012-12-18 DIAGNOSIS — M47817 Spondylosis without myelopathy or radiculopathy, lumbosacral region: Secondary | ICD-10-CM | POA: Diagnosis not present

## 2012-12-19 DIAGNOSIS — I10 Essential (primary) hypertension: Secondary | ICD-10-CM | POA: Diagnosis not present

## 2012-12-19 DIAGNOSIS — G459 Transient cerebral ischemic attack, unspecified: Secondary | ICD-10-CM | POA: Diagnosis not present

## 2012-12-19 DIAGNOSIS — G609 Hereditary and idiopathic neuropathy, unspecified: Secondary | ICD-10-CM | POA: Diagnosis not present

## 2012-12-19 DIAGNOSIS — E860 Dehydration: Secondary | ICD-10-CM | POA: Diagnosis not present

## 2012-12-28 DIAGNOSIS — M1990 Unspecified osteoarthritis, unspecified site: Secondary | ICD-10-CM | POA: Diagnosis not present

## 2012-12-28 DIAGNOSIS — G609 Hereditary and idiopathic neuropathy, unspecified: Secondary | ICD-10-CM | POA: Diagnosis not present

## 2012-12-28 DIAGNOSIS — G459 Transient cerebral ischemic attack, unspecified: Secondary | ICD-10-CM | POA: Diagnosis not present

## 2013-01-08 DIAGNOSIS — M1990 Unspecified osteoarthritis, unspecified site: Secondary | ICD-10-CM | POA: Diagnosis not present

## 2013-01-08 DIAGNOSIS — G609 Hereditary and idiopathic neuropathy, unspecified: Secondary | ICD-10-CM | POA: Diagnosis not present

## 2013-01-08 DIAGNOSIS — G459 Transient cerebral ischemic attack, unspecified: Secondary | ICD-10-CM | POA: Diagnosis not present

## 2013-01-08 DIAGNOSIS — R609 Edema, unspecified: Secondary | ICD-10-CM | POA: Diagnosis not present

## 2013-01-29 DIAGNOSIS — M545 Low back pain: Secondary | ICD-10-CM | POA: Diagnosis not present

## 2013-01-29 DIAGNOSIS — G459 Transient cerebral ischemic attack, unspecified: Secondary | ICD-10-CM | POA: Diagnosis not present

## 2013-01-29 DIAGNOSIS — G609 Hereditary and idiopathic neuropathy, unspecified: Secondary | ICD-10-CM | POA: Diagnosis not present

## 2013-01-29 DIAGNOSIS — M1990 Unspecified osteoarthritis, unspecified site: Secondary | ICD-10-CM | POA: Diagnosis not present

## 2013-02-18 DIAGNOSIS — H353190 Nonexudative age-related macular degeneration, unspecified eye, stage unspecified: Secondary | ICD-10-CM | POA: Diagnosis not present

## 2013-02-18 DIAGNOSIS — Z961 Presence of intraocular lens: Secondary | ICD-10-CM | POA: Diagnosis not present

## 2013-02-19 DIAGNOSIS — M19079 Primary osteoarthritis, unspecified ankle and foot: Secondary | ICD-10-CM | POA: Diagnosis not present

## 2013-02-19 DIAGNOSIS — M79609 Pain in unspecified limb: Secondary | ICD-10-CM | POA: Diagnosis not present

## 2013-02-19 DIAGNOSIS — M2040 Other hammer toe(s) (acquired), unspecified foot: Secondary | ICD-10-CM | POA: Diagnosis not present

## 2013-02-19 DIAGNOSIS — L600 Ingrowing nail: Secondary | ICD-10-CM | POA: Diagnosis not present

## 2013-02-23 DIAGNOSIS — IMO0002 Reserved for concepts with insufficient information to code with codable children: Secondary | ICD-10-CM | POA: Diagnosis not present

## 2013-02-24 DIAGNOSIS — S4980XA Other specified injuries of shoulder and upper arm, unspecified arm, initial encounter: Secondary | ICD-10-CM | POA: Diagnosis not present

## 2013-02-26 DIAGNOSIS — R42 Dizziness and giddiness: Secondary | ICD-10-CM | POA: Diagnosis not present

## 2013-02-26 DIAGNOSIS — R51 Headache: Secondary | ICD-10-CM | POA: Diagnosis not present

## 2013-02-26 DIAGNOSIS — Z8673 Personal history of transient ischemic attack (TIA), and cerebral infarction without residual deficits: Secondary | ICD-10-CM | POA: Diagnosis not present

## 2013-02-26 DIAGNOSIS — I252 Old myocardial infarction: Secondary | ICD-10-CM | POA: Diagnosis not present

## 2013-02-26 DIAGNOSIS — I10 Essential (primary) hypertension: Secondary | ICD-10-CM | POA: Diagnosis not present

## 2013-02-26 DIAGNOSIS — F0390 Unspecified dementia without behavioral disturbance: Secondary | ICD-10-CM | POA: Diagnosis not present

## 2013-02-26 DIAGNOSIS — F17200 Nicotine dependence, unspecified, uncomplicated: Secondary | ICD-10-CM | POA: Diagnosis not present

## 2013-02-26 DIAGNOSIS — G459 Transient cerebral ischemic attack, unspecified: Secondary | ICD-10-CM | POA: Diagnosis not present

## 2013-02-26 DIAGNOSIS — Z9181 History of falling: Secondary | ICD-10-CM | POA: Diagnosis not present

## 2013-02-26 DIAGNOSIS — R0602 Shortness of breath: Secondary | ICD-10-CM | POA: Diagnosis not present

## 2013-02-26 DIAGNOSIS — I951 Orthostatic hypotension: Secondary | ICD-10-CM | POA: Diagnosis not present

## 2013-02-27 DIAGNOSIS — M25519 Pain in unspecified shoulder: Secondary | ICD-10-CM | POA: Diagnosis not present

## 2013-03-03 DIAGNOSIS — M25519 Pain in unspecified shoulder: Secondary | ICD-10-CM | POA: Diagnosis not present

## 2013-03-06 DIAGNOSIS — M25519 Pain in unspecified shoulder: Secondary | ICD-10-CM | POA: Diagnosis not present

## 2013-03-10 DIAGNOSIS — M25519 Pain in unspecified shoulder: Secondary | ICD-10-CM | POA: Diagnosis not present

## 2013-03-11 DIAGNOSIS — R05 Cough: Secondary | ICD-10-CM | POA: Diagnosis not present

## 2013-03-13 DIAGNOSIS — M25519 Pain in unspecified shoulder: Secondary | ICD-10-CM | POA: Diagnosis not present

## 2013-03-26 ENCOUNTER — Encounter (INDEPENDENT_AMBULATORY_CARE_PROVIDER_SITE_OTHER): Payer: Medicare Other | Admitting: Ophthalmology

## 2013-03-26 ENCOUNTER — Encounter (INDEPENDENT_AMBULATORY_CARE_PROVIDER_SITE_OTHER): Payer: Self-pay | Admitting: Ophthalmology

## 2013-03-26 DIAGNOSIS — I10 Essential (primary) hypertension: Secondary | ICD-10-CM

## 2013-03-26 DIAGNOSIS — H35039 Hypertensive retinopathy, unspecified eye: Secondary | ICD-10-CM | POA: Diagnosis not present

## 2013-03-26 DIAGNOSIS — H3530 Unspecified macular degeneration: Secondary | ICD-10-CM

## 2013-03-26 DIAGNOSIS — H353290 Exudative age-related macular degeneration, unspecified eye, stage unspecified: Secondary | ICD-10-CM

## 2013-03-26 DIAGNOSIS — H43819 Vitreous degeneration, unspecified eye: Secondary | ICD-10-CM

## 2013-03-27 DIAGNOSIS — N183 Chronic kidney disease, stage 3 unspecified: Secondary | ICD-10-CM | POA: Diagnosis not present

## 2013-03-27 DIAGNOSIS — R609 Edema, unspecified: Secondary | ICD-10-CM | POA: Diagnosis not present

## 2013-03-27 DIAGNOSIS — R42 Dizziness and giddiness: Secondary | ICD-10-CM | POA: Diagnosis not present

## 2013-03-27 DIAGNOSIS — E119 Type 2 diabetes mellitus without complications: Secondary | ICD-10-CM | POA: Diagnosis not present

## 2013-03-27 DIAGNOSIS — E039 Hypothyroidism, unspecified: Secondary | ICD-10-CM | POA: Diagnosis not present

## 2013-03-27 DIAGNOSIS — I129 Hypertensive chronic kidney disease with stage 1 through stage 4 chronic kidney disease, or unspecified chronic kidney disease: Secondary | ICD-10-CM | POA: Diagnosis not present

## 2013-03-27 DIAGNOSIS — H3530 Unspecified macular degeneration: Secondary | ICD-10-CM | POA: Diagnosis not present

## 2013-03-27 DIAGNOSIS — F329 Major depressive disorder, single episode, unspecified: Secondary | ICD-10-CM | POA: Diagnosis not present

## 2013-04-01 DIAGNOSIS — E119 Type 2 diabetes mellitus without complications: Secondary | ICD-10-CM | POA: Diagnosis not present

## 2013-04-01 DIAGNOSIS — D649 Anemia, unspecified: Secondary | ICD-10-CM | POA: Diagnosis not present

## 2013-04-01 DIAGNOSIS — E785 Hyperlipidemia, unspecified: Secondary | ICD-10-CM | POA: Diagnosis not present

## 2013-04-01 DIAGNOSIS — E039 Hypothyroidism, unspecified: Secondary | ICD-10-CM | POA: Diagnosis not present

## 2013-04-01 DIAGNOSIS — E559 Vitamin D deficiency, unspecified: Secondary | ICD-10-CM | POA: Diagnosis not present

## 2013-04-03 DIAGNOSIS — M79609 Pain in unspecified limb: Secondary | ICD-10-CM | POA: Diagnosis not present

## 2013-04-03 DIAGNOSIS — B351 Tinea unguium: Secondary | ICD-10-CM | POA: Diagnosis not present

## 2013-04-04 DIAGNOSIS — I679 Cerebrovascular disease, unspecified: Secondary | ICD-10-CM | POA: Diagnosis not present

## 2013-04-04 DIAGNOSIS — H3530 Unspecified macular degeneration: Secondary | ICD-10-CM | POA: Diagnosis not present

## 2013-04-04 DIAGNOSIS — R269 Unspecified abnormalities of gait and mobility: Secondary | ICD-10-CM | POA: Diagnosis not present

## 2013-04-04 DIAGNOSIS — M25519 Pain in unspecified shoulder: Secondary | ICD-10-CM | POA: Diagnosis not present

## 2013-04-04 DIAGNOSIS — R262 Difficulty in walking, not elsewhere classified: Secondary | ICD-10-CM | POA: Diagnosis not present

## 2013-04-04 DIAGNOSIS — M6281 Muscle weakness (generalized): Secondary | ICD-10-CM | POA: Diagnosis not present

## 2013-04-04 DIAGNOSIS — R279 Unspecified lack of coordination: Secondary | ICD-10-CM | POA: Diagnosis not present

## 2013-04-04 DIAGNOSIS — H409 Unspecified glaucoma: Secondary | ICD-10-CM | POA: Diagnosis not present

## 2013-04-06 DIAGNOSIS — R269 Unspecified abnormalities of gait and mobility: Secondary | ICD-10-CM | POA: Diagnosis not present

## 2013-04-06 DIAGNOSIS — I679 Cerebrovascular disease, unspecified: Secondary | ICD-10-CM | POA: Diagnosis not present

## 2013-04-06 DIAGNOSIS — H3530 Unspecified macular degeneration: Secondary | ICD-10-CM | POA: Diagnosis not present

## 2013-04-06 DIAGNOSIS — H409 Unspecified glaucoma: Secondary | ICD-10-CM | POA: Diagnosis not present

## 2013-04-06 DIAGNOSIS — R262 Difficulty in walking, not elsewhere classified: Secondary | ICD-10-CM | POA: Diagnosis not present

## 2013-04-06 DIAGNOSIS — M25519 Pain in unspecified shoulder: Secondary | ICD-10-CM | POA: Diagnosis not present

## 2013-04-07 ENCOUNTER — Ambulatory Visit (INDEPENDENT_AMBULATORY_CARE_PROVIDER_SITE_OTHER): Payer: Medicare Other | Admitting: Ophthalmology

## 2013-04-07 DIAGNOSIS — H4010X0 Unspecified open-angle glaucoma, stage unspecified: Secondary | ICD-10-CM

## 2013-04-07 DIAGNOSIS — R262 Difficulty in walking, not elsewhere classified: Secondary | ICD-10-CM | POA: Diagnosis not present

## 2013-04-07 DIAGNOSIS — M25519 Pain in unspecified shoulder: Secondary | ICD-10-CM | POA: Diagnosis not present

## 2013-04-07 DIAGNOSIS — H3530 Unspecified macular degeneration: Secondary | ICD-10-CM | POA: Diagnosis not present

## 2013-04-07 DIAGNOSIS — H409 Unspecified glaucoma: Secondary | ICD-10-CM | POA: Diagnosis not present

## 2013-04-07 DIAGNOSIS — R269 Unspecified abnormalities of gait and mobility: Secondary | ICD-10-CM | POA: Diagnosis not present

## 2013-04-07 DIAGNOSIS — I679 Cerebrovascular disease, unspecified: Secondary | ICD-10-CM | POA: Diagnosis not present

## 2013-04-08 DIAGNOSIS — R262 Difficulty in walking, not elsewhere classified: Secondary | ICD-10-CM | POA: Diagnosis not present

## 2013-04-08 DIAGNOSIS — I679 Cerebrovascular disease, unspecified: Secondary | ICD-10-CM | POA: Diagnosis not present

## 2013-04-08 DIAGNOSIS — H3530 Unspecified macular degeneration: Secondary | ICD-10-CM | POA: Diagnosis not present

## 2013-04-08 DIAGNOSIS — M25519 Pain in unspecified shoulder: Secondary | ICD-10-CM | POA: Diagnosis not present

## 2013-04-08 DIAGNOSIS — H409 Unspecified glaucoma: Secondary | ICD-10-CM | POA: Diagnosis not present

## 2013-04-08 DIAGNOSIS — R269 Unspecified abnormalities of gait and mobility: Secondary | ICD-10-CM | POA: Diagnosis not present

## 2013-04-10 DIAGNOSIS — R262 Difficulty in walking, not elsewhere classified: Secondary | ICD-10-CM | POA: Diagnosis not present

## 2013-04-10 DIAGNOSIS — R269 Unspecified abnormalities of gait and mobility: Secondary | ICD-10-CM | POA: Diagnosis not present

## 2013-04-10 DIAGNOSIS — H3530 Unspecified macular degeneration: Secondary | ICD-10-CM | POA: Diagnosis not present

## 2013-04-10 DIAGNOSIS — I679 Cerebrovascular disease, unspecified: Secondary | ICD-10-CM | POA: Diagnosis not present

## 2013-04-10 DIAGNOSIS — M25519 Pain in unspecified shoulder: Secondary | ICD-10-CM | POA: Diagnosis not present

## 2013-04-10 DIAGNOSIS — H409 Unspecified glaucoma: Secondary | ICD-10-CM | POA: Diagnosis not present

## 2013-04-14 DIAGNOSIS — M25519 Pain in unspecified shoulder: Secondary | ICD-10-CM | POA: Diagnosis not present

## 2013-04-14 DIAGNOSIS — I679 Cerebrovascular disease, unspecified: Secondary | ICD-10-CM | POA: Diagnosis not present

## 2013-04-14 DIAGNOSIS — R269 Unspecified abnormalities of gait and mobility: Secondary | ICD-10-CM | POA: Diagnosis not present

## 2013-04-14 DIAGNOSIS — H409 Unspecified glaucoma: Secondary | ICD-10-CM | POA: Diagnosis not present

## 2013-04-14 DIAGNOSIS — H3530 Unspecified macular degeneration: Secondary | ICD-10-CM | POA: Diagnosis not present

## 2013-04-14 DIAGNOSIS — R262 Difficulty in walking, not elsewhere classified: Secondary | ICD-10-CM | POA: Diagnosis not present

## 2013-04-15 DIAGNOSIS — I679 Cerebrovascular disease, unspecified: Secondary | ICD-10-CM | POA: Diagnosis not present

## 2013-04-15 DIAGNOSIS — R262 Difficulty in walking, not elsewhere classified: Secondary | ICD-10-CM | POA: Diagnosis not present

## 2013-04-15 DIAGNOSIS — H409 Unspecified glaucoma: Secondary | ICD-10-CM | POA: Diagnosis not present

## 2013-04-15 DIAGNOSIS — H3530 Unspecified macular degeneration: Secondary | ICD-10-CM | POA: Diagnosis not present

## 2013-04-15 DIAGNOSIS — R269 Unspecified abnormalities of gait and mobility: Secondary | ICD-10-CM | POA: Diagnosis not present

## 2013-04-15 DIAGNOSIS — M25519 Pain in unspecified shoulder: Secondary | ICD-10-CM | POA: Diagnosis not present

## 2013-04-17 DIAGNOSIS — R262 Difficulty in walking, not elsewhere classified: Secondary | ICD-10-CM | POA: Diagnosis not present

## 2013-04-17 DIAGNOSIS — I679 Cerebrovascular disease, unspecified: Secondary | ICD-10-CM | POA: Diagnosis not present

## 2013-04-17 DIAGNOSIS — H3530 Unspecified macular degeneration: Secondary | ICD-10-CM | POA: Diagnosis not present

## 2013-04-17 DIAGNOSIS — M25519 Pain in unspecified shoulder: Secondary | ICD-10-CM | POA: Diagnosis not present

## 2013-04-17 DIAGNOSIS — H409 Unspecified glaucoma: Secondary | ICD-10-CM | POA: Diagnosis not present

## 2013-04-17 DIAGNOSIS — R269 Unspecified abnormalities of gait and mobility: Secondary | ICD-10-CM | POA: Diagnosis not present

## 2013-04-18 DIAGNOSIS — R269 Unspecified abnormalities of gait and mobility: Secondary | ICD-10-CM | POA: Diagnosis not present

## 2013-04-18 DIAGNOSIS — H3530 Unspecified macular degeneration: Secondary | ICD-10-CM | POA: Diagnosis not present

## 2013-04-18 DIAGNOSIS — H409 Unspecified glaucoma: Secondary | ICD-10-CM | POA: Diagnosis not present

## 2013-04-18 DIAGNOSIS — I679 Cerebrovascular disease, unspecified: Secondary | ICD-10-CM | POA: Diagnosis not present

## 2013-04-18 DIAGNOSIS — M25519 Pain in unspecified shoulder: Secondary | ICD-10-CM | POA: Diagnosis not present

## 2013-04-18 DIAGNOSIS — R262 Difficulty in walking, not elsewhere classified: Secondary | ICD-10-CM | POA: Diagnosis not present

## 2013-04-21 DIAGNOSIS — I679 Cerebrovascular disease, unspecified: Secondary | ICD-10-CM | POA: Diagnosis not present

## 2013-04-21 DIAGNOSIS — R269 Unspecified abnormalities of gait and mobility: Secondary | ICD-10-CM | POA: Diagnosis not present

## 2013-04-21 DIAGNOSIS — H409 Unspecified glaucoma: Secondary | ICD-10-CM | POA: Diagnosis not present

## 2013-04-21 DIAGNOSIS — M25519 Pain in unspecified shoulder: Secondary | ICD-10-CM | POA: Diagnosis not present

## 2013-04-21 DIAGNOSIS — R262 Difficulty in walking, not elsewhere classified: Secondary | ICD-10-CM | POA: Diagnosis not present

## 2013-04-21 DIAGNOSIS — H3530 Unspecified macular degeneration: Secondary | ICD-10-CM | POA: Diagnosis not present

## 2013-04-22 DIAGNOSIS — H3530 Unspecified macular degeneration: Secondary | ICD-10-CM | POA: Diagnosis not present

## 2013-04-22 DIAGNOSIS — R262 Difficulty in walking, not elsewhere classified: Secondary | ICD-10-CM | POA: Diagnosis not present

## 2013-04-22 DIAGNOSIS — R269 Unspecified abnormalities of gait and mobility: Secondary | ICD-10-CM | POA: Diagnosis not present

## 2013-04-22 DIAGNOSIS — H409 Unspecified glaucoma: Secondary | ICD-10-CM | POA: Diagnosis not present

## 2013-04-22 DIAGNOSIS — I679 Cerebrovascular disease, unspecified: Secondary | ICD-10-CM | POA: Diagnosis not present

## 2013-04-22 DIAGNOSIS — M25519 Pain in unspecified shoulder: Secondary | ICD-10-CM | POA: Diagnosis not present

## 2013-04-24 DIAGNOSIS — R269 Unspecified abnormalities of gait and mobility: Secondary | ICD-10-CM | POA: Diagnosis not present

## 2013-04-24 DIAGNOSIS — R262 Difficulty in walking, not elsewhere classified: Secondary | ICD-10-CM | POA: Diagnosis not present

## 2013-04-24 DIAGNOSIS — H3530 Unspecified macular degeneration: Secondary | ICD-10-CM | POA: Diagnosis not present

## 2013-04-24 DIAGNOSIS — I679 Cerebrovascular disease, unspecified: Secondary | ICD-10-CM | POA: Diagnosis not present

## 2013-04-24 DIAGNOSIS — H409 Unspecified glaucoma: Secondary | ICD-10-CM | POA: Diagnosis not present

## 2013-04-24 DIAGNOSIS — M25519 Pain in unspecified shoulder: Secondary | ICD-10-CM | POA: Diagnosis not present

## 2013-04-25 DIAGNOSIS — D485 Neoplasm of uncertain behavior of skin: Secondary | ICD-10-CM | POA: Diagnosis not present

## 2013-04-25 DIAGNOSIS — I679 Cerebrovascular disease, unspecified: Secondary | ICD-10-CM | POA: Diagnosis not present

## 2013-04-25 DIAGNOSIS — R262 Difficulty in walking, not elsewhere classified: Secondary | ICD-10-CM | POA: Diagnosis not present

## 2013-04-25 DIAGNOSIS — M25519 Pain in unspecified shoulder: Secondary | ICD-10-CM | POA: Diagnosis not present

## 2013-04-25 DIAGNOSIS — H409 Unspecified glaucoma: Secondary | ICD-10-CM | POA: Diagnosis not present

## 2013-04-25 DIAGNOSIS — L570 Actinic keratosis: Secondary | ICD-10-CM | POA: Diagnosis not present

## 2013-04-25 DIAGNOSIS — R269 Unspecified abnormalities of gait and mobility: Secondary | ICD-10-CM | POA: Diagnosis not present

## 2013-04-25 DIAGNOSIS — H3530 Unspecified macular degeneration: Secondary | ICD-10-CM | POA: Diagnosis not present

## 2013-04-26 DIAGNOSIS — H3530 Unspecified macular degeneration: Secondary | ICD-10-CM | POA: Diagnosis not present

## 2013-04-26 DIAGNOSIS — H409 Unspecified glaucoma: Secondary | ICD-10-CM | POA: Diagnosis not present

## 2013-04-26 DIAGNOSIS — R269 Unspecified abnormalities of gait and mobility: Secondary | ICD-10-CM | POA: Diagnosis not present

## 2013-04-26 DIAGNOSIS — M25519 Pain in unspecified shoulder: Secondary | ICD-10-CM | POA: Diagnosis not present

## 2013-04-26 DIAGNOSIS — I679 Cerebrovascular disease, unspecified: Secondary | ICD-10-CM | POA: Diagnosis not present

## 2013-04-26 DIAGNOSIS — R262 Difficulty in walking, not elsewhere classified: Secondary | ICD-10-CM | POA: Diagnosis not present

## 2013-04-28 DIAGNOSIS — I679 Cerebrovascular disease, unspecified: Secondary | ICD-10-CM | POA: Diagnosis not present

## 2013-04-28 DIAGNOSIS — H3530 Unspecified macular degeneration: Secondary | ICD-10-CM | POA: Diagnosis not present

## 2013-04-28 DIAGNOSIS — H409 Unspecified glaucoma: Secondary | ICD-10-CM | POA: Diagnosis not present

## 2013-04-28 DIAGNOSIS — R269 Unspecified abnormalities of gait and mobility: Secondary | ICD-10-CM | POA: Diagnosis not present

## 2013-04-28 DIAGNOSIS — R262 Difficulty in walking, not elsewhere classified: Secondary | ICD-10-CM | POA: Diagnosis not present

## 2013-04-28 DIAGNOSIS — M25519 Pain in unspecified shoulder: Secondary | ICD-10-CM | POA: Diagnosis not present

## 2013-05-01 DIAGNOSIS — R269 Unspecified abnormalities of gait and mobility: Secondary | ICD-10-CM | POA: Diagnosis not present

## 2013-05-01 DIAGNOSIS — H3530 Unspecified macular degeneration: Secondary | ICD-10-CM | POA: Diagnosis not present

## 2013-05-01 DIAGNOSIS — R262 Difficulty in walking, not elsewhere classified: Secondary | ICD-10-CM | POA: Diagnosis not present

## 2013-05-01 DIAGNOSIS — I679 Cerebrovascular disease, unspecified: Secondary | ICD-10-CM | POA: Diagnosis not present

## 2013-05-01 DIAGNOSIS — H409 Unspecified glaucoma: Secondary | ICD-10-CM | POA: Diagnosis not present

## 2013-05-01 DIAGNOSIS — M25519 Pain in unspecified shoulder: Secondary | ICD-10-CM | POA: Diagnosis not present

## 2013-05-05 DIAGNOSIS — H409 Unspecified glaucoma: Secondary | ICD-10-CM | POA: Diagnosis not present

## 2013-05-05 DIAGNOSIS — M25519 Pain in unspecified shoulder: Secondary | ICD-10-CM | POA: Diagnosis not present

## 2013-05-05 DIAGNOSIS — R269 Unspecified abnormalities of gait and mobility: Secondary | ICD-10-CM | POA: Diagnosis not present

## 2013-05-05 DIAGNOSIS — I679 Cerebrovascular disease, unspecified: Secondary | ICD-10-CM | POA: Diagnosis not present

## 2013-05-05 DIAGNOSIS — R262 Difficulty in walking, not elsewhere classified: Secondary | ICD-10-CM | POA: Diagnosis not present

## 2013-05-05 DIAGNOSIS — H3530 Unspecified macular degeneration: Secondary | ICD-10-CM | POA: Diagnosis not present

## 2013-05-06 DIAGNOSIS — M25519 Pain in unspecified shoulder: Secondary | ICD-10-CM | POA: Diagnosis not present

## 2013-05-06 DIAGNOSIS — R269 Unspecified abnormalities of gait and mobility: Secondary | ICD-10-CM | POA: Diagnosis not present

## 2013-05-06 DIAGNOSIS — I679 Cerebrovascular disease, unspecified: Secondary | ICD-10-CM | POA: Diagnosis not present

## 2013-05-06 DIAGNOSIS — H409 Unspecified glaucoma: Secondary | ICD-10-CM | POA: Diagnosis not present

## 2013-05-06 DIAGNOSIS — R262 Difficulty in walking, not elsewhere classified: Secondary | ICD-10-CM | POA: Diagnosis not present

## 2013-05-06 DIAGNOSIS — H3530 Unspecified macular degeneration: Secondary | ICD-10-CM | POA: Diagnosis not present

## 2013-05-08 DIAGNOSIS — H3530 Unspecified macular degeneration: Secondary | ICD-10-CM | POA: Diagnosis not present

## 2013-05-08 DIAGNOSIS — M25519 Pain in unspecified shoulder: Secondary | ICD-10-CM | POA: Diagnosis not present

## 2013-05-08 DIAGNOSIS — R269 Unspecified abnormalities of gait and mobility: Secondary | ICD-10-CM | POA: Diagnosis not present

## 2013-05-08 DIAGNOSIS — I679 Cerebrovascular disease, unspecified: Secondary | ICD-10-CM | POA: Diagnosis not present

## 2013-05-08 DIAGNOSIS — H409 Unspecified glaucoma: Secondary | ICD-10-CM | POA: Diagnosis not present

## 2013-05-08 DIAGNOSIS — R262 Difficulty in walking, not elsewhere classified: Secondary | ICD-10-CM | POA: Diagnosis not present

## 2013-05-13 DIAGNOSIS — H409 Unspecified glaucoma: Secondary | ICD-10-CM | POA: Diagnosis not present

## 2013-05-13 DIAGNOSIS — I679 Cerebrovascular disease, unspecified: Secondary | ICD-10-CM | POA: Diagnosis not present

## 2013-05-13 DIAGNOSIS — M25519 Pain in unspecified shoulder: Secondary | ICD-10-CM | POA: Diagnosis not present

## 2013-05-13 DIAGNOSIS — H3530 Unspecified macular degeneration: Secondary | ICD-10-CM | POA: Diagnosis not present

## 2013-05-13 DIAGNOSIS — R262 Difficulty in walking, not elsewhere classified: Secondary | ICD-10-CM | POA: Diagnosis not present

## 2013-05-13 DIAGNOSIS — R269 Unspecified abnormalities of gait and mobility: Secondary | ICD-10-CM | POA: Diagnosis not present

## 2013-05-21 DIAGNOSIS — R269 Unspecified abnormalities of gait and mobility: Secondary | ICD-10-CM | POA: Diagnosis not present

## 2013-05-21 DIAGNOSIS — H3530 Unspecified macular degeneration: Secondary | ICD-10-CM | POA: Diagnosis not present

## 2013-05-21 DIAGNOSIS — H409 Unspecified glaucoma: Secondary | ICD-10-CM | POA: Diagnosis not present

## 2013-05-21 DIAGNOSIS — M25519 Pain in unspecified shoulder: Secondary | ICD-10-CM | POA: Diagnosis not present

## 2013-05-21 DIAGNOSIS — I679 Cerebrovascular disease, unspecified: Secondary | ICD-10-CM | POA: Diagnosis not present

## 2013-05-21 DIAGNOSIS — R262 Difficulty in walking, not elsewhere classified: Secondary | ICD-10-CM | POA: Diagnosis not present

## 2013-05-23 DIAGNOSIS — M25519 Pain in unspecified shoulder: Secondary | ICD-10-CM | POA: Diagnosis not present

## 2013-05-23 DIAGNOSIS — R262 Difficulty in walking, not elsewhere classified: Secondary | ICD-10-CM | POA: Diagnosis not present

## 2013-05-23 DIAGNOSIS — R269 Unspecified abnormalities of gait and mobility: Secondary | ICD-10-CM | POA: Diagnosis not present

## 2013-05-23 DIAGNOSIS — H409 Unspecified glaucoma: Secondary | ICD-10-CM | POA: Diagnosis not present

## 2013-05-23 DIAGNOSIS — I679 Cerebrovascular disease, unspecified: Secondary | ICD-10-CM | POA: Diagnosis not present

## 2013-05-23 DIAGNOSIS — H3530 Unspecified macular degeneration: Secondary | ICD-10-CM | POA: Diagnosis not present

## 2013-05-27 DIAGNOSIS — M6281 Muscle weakness (generalized): Secondary | ICD-10-CM | POA: Diagnosis not present

## 2013-05-27 DIAGNOSIS — R488 Other symbolic dysfunctions: Secondary | ICD-10-CM | POA: Diagnosis not present

## 2013-05-27 DIAGNOSIS — R279 Unspecified lack of coordination: Secondary | ICD-10-CM | POA: Diagnosis not present

## 2013-05-27 DIAGNOSIS — I69998 Other sequelae following unspecified cerebrovascular disease: Secondary | ICD-10-CM | POA: Diagnosis not present

## 2013-05-27 DIAGNOSIS — I69959 Hemiplegia and hemiparesis following unspecified cerebrovascular disease affecting unspecified side: Secondary | ICD-10-CM | POA: Diagnosis not present

## 2013-05-27 DIAGNOSIS — I6990 Unspecified sequelae of unspecified cerebrovascular disease: Secondary | ICD-10-CM | POA: Diagnosis not present

## 2013-05-27 DIAGNOSIS — R262 Difficulty in walking, not elsewhere classified: Secondary | ICD-10-CM | POA: Diagnosis not present

## 2013-05-27 DIAGNOSIS — M159 Polyosteoarthritis, unspecified: Secondary | ICD-10-CM | POA: Diagnosis not present

## 2013-05-28 DIAGNOSIS — M6281 Muscle weakness (generalized): Secondary | ICD-10-CM | POA: Diagnosis not present

## 2013-05-28 DIAGNOSIS — R279 Unspecified lack of coordination: Secondary | ICD-10-CM | POA: Diagnosis not present

## 2013-05-28 DIAGNOSIS — R262 Difficulty in walking, not elsewhere classified: Secondary | ICD-10-CM | POA: Diagnosis not present

## 2013-05-28 DIAGNOSIS — R488 Other symbolic dysfunctions: Secondary | ICD-10-CM | POA: Diagnosis not present

## 2013-05-28 DIAGNOSIS — I69998 Other sequelae following unspecified cerebrovascular disease: Secondary | ICD-10-CM | POA: Diagnosis not present

## 2013-05-28 DIAGNOSIS — I6990 Unspecified sequelae of unspecified cerebrovascular disease: Secondary | ICD-10-CM | POA: Diagnosis not present

## 2013-05-29 DIAGNOSIS — I6990 Unspecified sequelae of unspecified cerebrovascular disease: Secondary | ICD-10-CM | POA: Diagnosis not present

## 2013-05-29 DIAGNOSIS — R488 Other symbolic dysfunctions: Secondary | ICD-10-CM | POA: Diagnosis not present

## 2013-05-29 DIAGNOSIS — M6281 Muscle weakness (generalized): Secondary | ICD-10-CM | POA: Diagnosis not present

## 2013-05-29 DIAGNOSIS — R262 Difficulty in walking, not elsewhere classified: Secondary | ICD-10-CM | POA: Diagnosis not present

## 2013-05-29 DIAGNOSIS — R279 Unspecified lack of coordination: Secondary | ICD-10-CM | POA: Diagnosis not present

## 2013-05-29 DIAGNOSIS — I69998 Other sequelae following unspecified cerebrovascular disease: Secondary | ICD-10-CM | POA: Diagnosis not present

## 2013-05-30 DIAGNOSIS — M6281 Muscle weakness (generalized): Secondary | ICD-10-CM | POA: Diagnosis not present

## 2013-05-30 DIAGNOSIS — R279 Unspecified lack of coordination: Secondary | ICD-10-CM | POA: Diagnosis not present

## 2013-05-30 DIAGNOSIS — R262 Difficulty in walking, not elsewhere classified: Secondary | ICD-10-CM | POA: Diagnosis not present

## 2013-05-30 DIAGNOSIS — R488 Other symbolic dysfunctions: Secondary | ICD-10-CM | POA: Diagnosis not present

## 2013-05-30 DIAGNOSIS — I69998 Other sequelae following unspecified cerebrovascular disease: Secondary | ICD-10-CM | POA: Diagnosis not present

## 2013-05-30 DIAGNOSIS — I6990 Unspecified sequelae of unspecified cerebrovascular disease: Secondary | ICD-10-CM | POA: Diagnosis not present

## 2013-06-02 DIAGNOSIS — I69998 Other sequelae following unspecified cerebrovascular disease: Secondary | ICD-10-CM | POA: Diagnosis not present

## 2013-06-02 DIAGNOSIS — H3530 Unspecified macular degeneration: Secondary | ICD-10-CM | POA: Diagnosis not present

## 2013-06-02 DIAGNOSIS — F329 Major depressive disorder, single episode, unspecified: Secondary | ICD-10-CM | POA: Diagnosis not present

## 2013-06-02 DIAGNOSIS — R262 Difficulty in walking, not elsewhere classified: Secondary | ICD-10-CM | POA: Diagnosis not present

## 2013-06-02 DIAGNOSIS — I129 Hypertensive chronic kidney disease with stage 1 through stage 4 chronic kidney disease, or unspecified chronic kidney disease: Secondary | ICD-10-CM | POA: Diagnosis not present

## 2013-06-02 DIAGNOSIS — I6990 Unspecified sequelae of unspecified cerebrovascular disease: Secondary | ICD-10-CM | POA: Diagnosis not present

## 2013-06-02 DIAGNOSIS — M6281 Muscle weakness (generalized): Secondary | ICD-10-CM | POA: Diagnosis not present

## 2013-06-02 DIAGNOSIS — R609 Edema, unspecified: Secondary | ICD-10-CM | POA: Diagnosis not present

## 2013-06-02 DIAGNOSIS — R42 Dizziness and giddiness: Secondary | ICD-10-CM | POA: Diagnosis not present

## 2013-06-02 DIAGNOSIS — R279 Unspecified lack of coordination: Secondary | ICD-10-CM | POA: Diagnosis not present

## 2013-06-02 DIAGNOSIS — E119 Type 2 diabetes mellitus without complications: Secondary | ICD-10-CM | POA: Diagnosis not present

## 2013-06-02 DIAGNOSIS — R488 Other symbolic dysfunctions: Secondary | ICD-10-CM | POA: Diagnosis not present

## 2013-06-02 DIAGNOSIS — E039 Hypothyroidism, unspecified: Secondary | ICD-10-CM | POA: Diagnosis not present

## 2013-06-03 DIAGNOSIS — R262 Difficulty in walking, not elsewhere classified: Secondary | ICD-10-CM | POA: Diagnosis not present

## 2013-06-03 DIAGNOSIS — M6281 Muscle weakness (generalized): Secondary | ICD-10-CM | POA: Diagnosis not present

## 2013-06-03 DIAGNOSIS — R488 Other symbolic dysfunctions: Secondary | ICD-10-CM | POA: Diagnosis not present

## 2013-06-03 DIAGNOSIS — I6990 Unspecified sequelae of unspecified cerebrovascular disease: Secondary | ICD-10-CM | POA: Diagnosis not present

## 2013-06-03 DIAGNOSIS — R279 Unspecified lack of coordination: Secondary | ICD-10-CM | POA: Diagnosis not present

## 2013-06-03 DIAGNOSIS — I69998 Other sequelae following unspecified cerebrovascular disease: Secondary | ICD-10-CM | POA: Diagnosis not present

## 2013-06-04 DIAGNOSIS — R488 Other symbolic dysfunctions: Secondary | ICD-10-CM | POA: Diagnosis not present

## 2013-06-04 DIAGNOSIS — R279 Unspecified lack of coordination: Secondary | ICD-10-CM | POA: Diagnosis not present

## 2013-06-04 DIAGNOSIS — R262 Difficulty in walking, not elsewhere classified: Secondary | ICD-10-CM | POA: Diagnosis not present

## 2013-06-04 DIAGNOSIS — M6281 Muscle weakness (generalized): Secondary | ICD-10-CM | POA: Diagnosis not present

## 2013-06-04 DIAGNOSIS — I69998 Other sequelae following unspecified cerebrovascular disease: Secondary | ICD-10-CM | POA: Diagnosis not present

## 2013-06-04 DIAGNOSIS — I6990 Unspecified sequelae of unspecified cerebrovascular disease: Secondary | ICD-10-CM | POA: Diagnosis not present

## 2013-06-05 DIAGNOSIS — I6990 Unspecified sequelae of unspecified cerebrovascular disease: Secondary | ICD-10-CM | POA: Diagnosis not present

## 2013-06-05 DIAGNOSIS — M6281 Muscle weakness (generalized): Secondary | ICD-10-CM | POA: Diagnosis not present

## 2013-06-05 DIAGNOSIS — R488 Other symbolic dysfunctions: Secondary | ICD-10-CM | POA: Diagnosis not present

## 2013-06-05 DIAGNOSIS — R262 Difficulty in walking, not elsewhere classified: Secondary | ICD-10-CM | POA: Diagnosis not present

## 2013-06-05 DIAGNOSIS — R279 Unspecified lack of coordination: Secondary | ICD-10-CM | POA: Diagnosis not present

## 2013-06-05 DIAGNOSIS — I69998 Other sequelae following unspecified cerebrovascular disease: Secondary | ICD-10-CM | POA: Diagnosis not present

## 2013-06-06 DIAGNOSIS — I69998 Other sequelae following unspecified cerebrovascular disease: Secondary | ICD-10-CM | POA: Diagnosis not present

## 2013-06-06 DIAGNOSIS — I6990 Unspecified sequelae of unspecified cerebrovascular disease: Secondary | ICD-10-CM | POA: Diagnosis not present

## 2013-06-06 DIAGNOSIS — R279 Unspecified lack of coordination: Secondary | ICD-10-CM | POA: Diagnosis not present

## 2013-06-06 DIAGNOSIS — R262 Difficulty in walking, not elsewhere classified: Secondary | ICD-10-CM | POA: Diagnosis not present

## 2013-06-06 DIAGNOSIS — R488 Other symbolic dysfunctions: Secondary | ICD-10-CM | POA: Diagnosis not present

## 2013-06-06 DIAGNOSIS — M6281 Muscle weakness (generalized): Secondary | ICD-10-CM | POA: Diagnosis not present

## 2013-06-08 DIAGNOSIS — M6281 Muscle weakness (generalized): Secondary | ICD-10-CM | POA: Diagnosis not present

## 2013-06-08 DIAGNOSIS — R279 Unspecified lack of coordination: Secondary | ICD-10-CM | POA: Diagnosis not present

## 2013-06-08 DIAGNOSIS — R262 Difficulty in walking, not elsewhere classified: Secondary | ICD-10-CM | POA: Diagnosis not present

## 2013-06-08 DIAGNOSIS — R488 Other symbolic dysfunctions: Secondary | ICD-10-CM | POA: Diagnosis not present

## 2013-06-08 DIAGNOSIS — I6990 Unspecified sequelae of unspecified cerebrovascular disease: Secondary | ICD-10-CM | POA: Diagnosis not present

## 2013-06-08 DIAGNOSIS — I69998 Other sequelae following unspecified cerebrovascular disease: Secondary | ICD-10-CM | POA: Diagnosis not present

## 2013-06-09 DIAGNOSIS — R488 Other symbolic dysfunctions: Secondary | ICD-10-CM | POA: Diagnosis not present

## 2013-06-09 DIAGNOSIS — R279 Unspecified lack of coordination: Secondary | ICD-10-CM | POA: Diagnosis not present

## 2013-06-09 DIAGNOSIS — M6281 Muscle weakness (generalized): Secondary | ICD-10-CM | POA: Diagnosis not present

## 2013-06-09 DIAGNOSIS — I6990 Unspecified sequelae of unspecified cerebrovascular disease: Secondary | ICD-10-CM | POA: Diagnosis not present

## 2013-06-09 DIAGNOSIS — C44319 Basal cell carcinoma of skin of other parts of face: Secondary | ICD-10-CM | POA: Diagnosis not present

## 2013-06-09 DIAGNOSIS — R262 Difficulty in walking, not elsewhere classified: Secondary | ICD-10-CM | POA: Diagnosis not present

## 2013-06-09 DIAGNOSIS — I69998 Other sequelae following unspecified cerebrovascular disease: Secondary | ICD-10-CM | POA: Diagnosis not present

## 2013-06-10 DIAGNOSIS — R488 Other symbolic dysfunctions: Secondary | ICD-10-CM | POA: Diagnosis not present

## 2013-06-10 DIAGNOSIS — R262 Difficulty in walking, not elsewhere classified: Secondary | ICD-10-CM | POA: Diagnosis not present

## 2013-06-10 DIAGNOSIS — M6281 Muscle weakness (generalized): Secondary | ICD-10-CM | POA: Diagnosis not present

## 2013-06-10 DIAGNOSIS — I6990 Unspecified sequelae of unspecified cerebrovascular disease: Secondary | ICD-10-CM | POA: Diagnosis not present

## 2013-06-10 DIAGNOSIS — I69998 Other sequelae following unspecified cerebrovascular disease: Secondary | ICD-10-CM | POA: Diagnosis not present

## 2013-06-10 DIAGNOSIS — R279 Unspecified lack of coordination: Secondary | ICD-10-CM | POA: Diagnosis not present

## 2013-06-11 DIAGNOSIS — I69998 Other sequelae following unspecified cerebrovascular disease: Secondary | ICD-10-CM | POA: Diagnosis not present

## 2013-06-11 DIAGNOSIS — R279 Unspecified lack of coordination: Secondary | ICD-10-CM | POA: Diagnosis not present

## 2013-06-11 DIAGNOSIS — M6281 Muscle weakness (generalized): Secondary | ICD-10-CM | POA: Diagnosis not present

## 2013-06-11 DIAGNOSIS — R488 Other symbolic dysfunctions: Secondary | ICD-10-CM | POA: Diagnosis not present

## 2013-06-11 DIAGNOSIS — I6990 Unspecified sequelae of unspecified cerebrovascular disease: Secondary | ICD-10-CM | POA: Diagnosis not present

## 2013-06-11 DIAGNOSIS — R262 Difficulty in walking, not elsewhere classified: Secondary | ICD-10-CM | POA: Diagnosis not present

## 2013-06-13 DIAGNOSIS — R488 Other symbolic dysfunctions: Secondary | ICD-10-CM | POA: Diagnosis not present

## 2013-06-13 DIAGNOSIS — M6281 Muscle weakness (generalized): Secondary | ICD-10-CM | POA: Diagnosis not present

## 2013-06-13 DIAGNOSIS — I69998 Other sequelae following unspecified cerebrovascular disease: Secondary | ICD-10-CM | POA: Diagnosis not present

## 2013-06-13 DIAGNOSIS — I6990 Unspecified sequelae of unspecified cerebrovascular disease: Secondary | ICD-10-CM | POA: Diagnosis not present

## 2013-06-13 DIAGNOSIS — R262 Difficulty in walking, not elsewhere classified: Secondary | ICD-10-CM | POA: Diagnosis not present

## 2013-06-13 DIAGNOSIS — R279 Unspecified lack of coordination: Secondary | ICD-10-CM | POA: Diagnosis not present

## 2013-06-16 DIAGNOSIS — I69998 Other sequelae following unspecified cerebrovascular disease: Secondary | ICD-10-CM | POA: Diagnosis not present

## 2013-06-16 DIAGNOSIS — I6990 Unspecified sequelae of unspecified cerebrovascular disease: Secondary | ICD-10-CM | POA: Diagnosis not present

## 2013-06-16 DIAGNOSIS — R488 Other symbolic dysfunctions: Secondary | ICD-10-CM | POA: Diagnosis not present

## 2013-06-16 DIAGNOSIS — R262 Difficulty in walking, not elsewhere classified: Secondary | ICD-10-CM | POA: Diagnosis not present

## 2013-06-16 DIAGNOSIS — R279 Unspecified lack of coordination: Secondary | ICD-10-CM | POA: Diagnosis not present

## 2013-06-16 DIAGNOSIS — M6281 Muscle weakness (generalized): Secondary | ICD-10-CM | POA: Diagnosis not present

## 2013-06-17 DIAGNOSIS — M6281 Muscle weakness (generalized): Secondary | ICD-10-CM | POA: Diagnosis not present

## 2013-06-17 DIAGNOSIS — R279 Unspecified lack of coordination: Secondary | ICD-10-CM | POA: Diagnosis not present

## 2013-06-17 DIAGNOSIS — R262 Difficulty in walking, not elsewhere classified: Secondary | ICD-10-CM | POA: Diagnosis not present

## 2013-06-17 DIAGNOSIS — R488 Other symbolic dysfunctions: Secondary | ICD-10-CM | POA: Diagnosis not present

## 2013-06-17 DIAGNOSIS — I6990 Unspecified sequelae of unspecified cerebrovascular disease: Secondary | ICD-10-CM | POA: Diagnosis not present

## 2013-06-17 DIAGNOSIS — I69998 Other sequelae following unspecified cerebrovascular disease: Secondary | ICD-10-CM | POA: Diagnosis not present

## 2013-06-18 DIAGNOSIS — R488 Other symbolic dysfunctions: Secondary | ICD-10-CM | POA: Diagnosis not present

## 2013-06-18 DIAGNOSIS — R262 Difficulty in walking, not elsewhere classified: Secondary | ICD-10-CM | POA: Diagnosis not present

## 2013-06-18 DIAGNOSIS — M6281 Muscle weakness (generalized): Secondary | ICD-10-CM | POA: Diagnosis not present

## 2013-06-18 DIAGNOSIS — I6990 Unspecified sequelae of unspecified cerebrovascular disease: Secondary | ICD-10-CM | POA: Diagnosis not present

## 2013-06-18 DIAGNOSIS — I69998 Other sequelae following unspecified cerebrovascular disease: Secondary | ICD-10-CM | POA: Diagnosis not present

## 2013-06-18 DIAGNOSIS — R279 Unspecified lack of coordination: Secondary | ICD-10-CM | POA: Diagnosis not present

## 2013-06-19 DIAGNOSIS — D649 Anemia, unspecified: Secondary | ICD-10-CM

## 2013-06-19 HISTORY — DX: Anemia, unspecified: D64.9

## 2013-06-20 DIAGNOSIS — I69959 Hemiplegia and hemiparesis following unspecified cerebrovascular disease affecting unspecified side: Secondary | ICD-10-CM | POA: Diagnosis not present

## 2013-06-20 DIAGNOSIS — R279 Unspecified lack of coordination: Secondary | ICD-10-CM | POA: Diagnosis not present

## 2013-06-20 DIAGNOSIS — R488 Other symbolic dysfunctions: Secondary | ICD-10-CM | POA: Diagnosis not present

## 2013-06-20 DIAGNOSIS — I69998 Other sequelae following unspecified cerebrovascular disease: Secondary | ICD-10-CM | POA: Diagnosis not present

## 2013-06-20 DIAGNOSIS — M6281 Muscle weakness (generalized): Secondary | ICD-10-CM | POA: Diagnosis not present

## 2013-06-20 DIAGNOSIS — I6990 Unspecified sequelae of unspecified cerebrovascular disease: Secondary | ICD-10-CM | POA: Diagnosis not present

## 2013-06-20 DIAGNOSIS — M159 Polyosteoarthritis, unspecified: Secondary | ICD-10-CM | POA: Diagnosis not present

## 2013-06-20 DIAGNOSIS — R262 Difficulty in walking, not elsewhere classified: Secondary | ICD-10-CM | POA: Diagnosis not present

## 2013-06-21 DIAGNOSIS — I6990 Unspecified sequelae of unspecified cerebrovascular disease: Secondary | ICD-10-CM | POA: Diagnosis not present

## 2013-06-21 DIAGNOSIS — R488 Other symbolic dysfunctions: Secondary | ICD-10-CM | POA: Diagnosis not present

## 2013-06-21 DIAGNOSIS — R262 Difficulty in walking, not elsewhere classified: Secondary | ICD-10-CM | POA: Diagnosis not present

## 2013-06-21 DIAGNOSIS — I69998 Other sequelae following unspecified cerebrovascular disease: Secondary | ICD-10-CM | POA: Diagnosis not present

## 2013-06-21 DIAGNOSIS — M6281 Muscle weakness (generalized): Secondary | ICD-10-CM | POA: Diagnosis not present

## 2013-06-21 DIAGNOSIS — R279 Unspecified lack of coordination: Secondary | ICD-10-CM | POA: Diagnosis not present

## 2013-06-23 DIAGNOSIS — I69998 Other sequelae following unspecified cerebrovascular disease: Secondary | ICD-10-CM | POA: Diagnosis not present

## 2013-06-23 DIAGNOSIS — M6281 Muscle weakness (generalized): Secondary | ICD-10-CM | POA: Diagnosis not present

## 2013-06-23 DIAGNOSIS — R262 Difficulty in walking, not elsewhere classified: Secondary | ICD-10-CM | POA: Diagnosis not present

## 2013-06-23 DIAGNOSIS — I6990 Unspecified sequelae of unspecified cerebrovascular disease: Secondary | ICD-10-CM | POA: Diagnosis not present

## 2013-06-23 DIAGNOSIS — R279 Unspecified lack of coordination: Secondary | ICD-10-CM | POA: Diagnosis not present

## 2013-06-23 DIAGNOSIS — R488 Other symbolic dysfunctions: Secondary | ICD-10-CM | POA: Diagnosis not present

## 2013-06-24 DIAGNOSIS — R262 Difficulty in walking, not elsewhere classified: Secondary | ICD-10-CM | POA: Diagnosis not present

## 2013-06-24 DIAGNOSIS — R488 Other symbolic dysfunctions: Secondary | ICD-10-CM | POA: Diagnosis not present

## 2013-06-24 DIAGNOSIS — M6281 Muscle weakness (generalized): Secondary | ICD-10-CM | POA: Diagnosis not present

## 2013-06-24 DIAGNOSIS — R279 Unspecified lack of coordination: Secondary | ICD-10-CM | POA: Diagnosis not present

## 2013-06-24 DIAGNOSIS — I6990 Unspecified sequelae of unspecified cerebrovascular disease: Secondary | ICD-10-CM | POA: Diagnosis not present

## 2013-06-24 DIAGNOSIS — I69998 Other sequelae following unspecified cerebrovascular disease: Secondary | ICD-10-CM | POA: Diagnosis not present

## 2013-06-25 DIAGNOSIS — R279 Unspecified lack of coordination: Secondary | ICD-10-CM | POA: Diagnosis not present

## 2013-06-25 DIAGNOSIS — R488 Other symbolic dysfunctions: Secondary | ICD-10-CM | POA: Diagnosis not present

## 2013-06-25 DIAGNOSIS — M6281 Muscle weakness (generalized): Secondary | ICD-10-CM | POA: Diagnosis not present

## 2013-06-25 DIAGNOSIS — I6990 Unspecified sequelae of unspecified cerebrovascular disease: Secondary | ICD-10-CM | POA: Diagnosis not present

## 2013-06-25 DIAGNOSIS — R262 Difficulty in walking, not elsewhere classified: Secondary | ICD-10-CM | POA: Diagnosis not present

## 2013-06-25 DIAGNOSIS — I69998 Other sequelae following unspecified cerebrovascular disease: Secondary | ICD-10-CM | POA: Diagnosis not present

## 2013-06-26 DIAGNOSIS — R488 Other symbolic dysfunctions: Secondary | ICD-10-CM | POA: Diagnosis not present

## 2013-06-26 DIAGNOSIS — M6281 Muscle weakness (generalized): Secondary | ICD-10-CM | POA: Diagnosis not present

## 2013-06-26 DIAGNOSIS — I69998 Other sequelae following unspecified cerebrovascular disease: Secondary | ICD-10-CM | POA: Diagnosis not present

## 2013-06-26 DIAGNOSIS — R262 Difficulty in walking, not elsewhere classified: Secondary | ICD-10-CM | POA: Diagnosis not present

## 2013-06-26 DIAGNOSIS — R279 Unspecified lack of coordination: Secondary | ICD-10-CM | POA: Diagnosis not present

## 2013-06-26 DIAGNOSIS — I6990 Unspecified sequelae of unspecified cerebrovascular disease: Secondary | ICD-10-CM | POA: Diagnosis not present

## 2013-06-27 DIAGNOSIS — M6281 Muscle weakness (generalized): Secondary | ICD-10-CM | POA: Diagnosis not present

## 2013-06-27 DIAGNOSIS — I6990 Unspecified sequelae of unspecified cerebrovascular disease: Secondary | ICD-10-CM | POA: Diagnosis not present

## 2013-06-27 DIAGNOSIS — I69998 Other sequelae following unspecified cerebrovascular disease: Secondary | ICD-10-CM | POA: Diagnosis not present

## 2013-06-27 DIAGNOSIS — R279 Unspecified lack of coordination: Secondary | ICD-10-CM | POA: Diagnosis not present

## 2013-06-27 DIAGNOSIS — R262 Difficulty in walking, not elsewhere classified: Secondary | ICD-10-CM | POA: Diagnosis not present

## 2013-06-27 DIAGNOSIS — R488 Other symbolic dysfunctions: Secondary | ICD-10-CM | POA: Diagnosis not present

## 2013-06-30 DIAGNOSIS — I69998 Other sequelae following unspecified cerebrovascular disease: Secondary | ICD-10-CM | POA: Diagnosis not present

## 2013-06-30 DIAGNOSIS — R488 Other symbolic dysfunctions: Secondary | ICD-10-CM | POA: Diagnosis not present

## 2013-06-30 DIAGNOSIS — R279 Unspecified lack of coordination: Secondary | ICD-10-CM | POA: Diagnosis not present

## 2013-06-30 DIAGNOSIS — I6990 Unspecified sequelae of unspecified cerebrovascular disease: Secondary | ICD-10-CM | POA: Diagnosis not present

## 2013-06-30 DIAGNOSIS — M6281 Muscle weakness (generalized): Secondary | ICD-10-CM | POA: Diagnosis not present

## 2013-06-30 DIAGNOSIS — R262 Difficulty in walking, not elsewhere classified: Secondary | ICD-10-CM | POA: Diagnosis not present

## 2013-07-01 DIAGNOSIS — R488 Other symbolic dysfunctions: Secondary | ICD-10-CM | POA: Diagnosis not present

## 2013-07-01 DIAGNOSIS — M6281 Muscle weakness (generalized): Secondary | ICD-10-CM | POA: Diagnosis not present

## 2013-07-01 DIAGNOSIS — R262 Difficulty in walking, not elsewhere classified: Secondary | ICD-10-CM | POA: Diagnosis not present

## 2013-07-01 DIAGNOSIS — I69998 Other sequelae following unspecified cerebrovascular disease: Secondary | ICD-10-CM | POA: Diagnosis not present

## 2013-07-01 DIAGNOSIS — I6990 Unspecified sequelae of unspecified cerebrovascular disease: Secondary | ICD-10-CM | POA: Diagnosis not present

## 2013-07-01 DIAGNOSIS — R279 Unspecified lack of coordination: Secondary | ICD-10-CM | POA: Diagnosis not present

## 2013-07-02 DIAGNOSIS — R488 Other symbolic dysfunctions: Secondary | ICD-10-CM | POA: Diagnosis not present

## 2013-07-02 DIAGNOSIS — I69998 Other sequelae following unspecified cerebrovascular disease: Secondary | ICD-10-CM | POA: Diagnosis not present

## 2013-07-02 DIAGNOSIS — I6990 Unspecified sequelae of unspecified cerebrovascular disease: Secondary | ICD-10-CM | POA: Diagnosis not present

## 2013-07-02 DIAGNOSIS — R279 Unspecified lack of coordination: Secondary | ICD-10-CM | POA: Diagnosis not present

## 2013-07-02 DIAGNOSIS — M6281 Muscle weakness (generalized): Secondary | ICD-10-CM | POA: Diagnosis not present

## 2013-07-02 DIAGNOSIS — R262 Difficulty in walking, not elsewhere classified: Secondary | ICD-10-CM | POA: Diagnosis not present

## 2013-07-03 DIAGNOSIS — R262 Difficulty in walking, not elsewhere classified: Secondary | ICD-10-CM | POA: Diagnosis not present

## 2013-07-03 DIAGNOSIS — R488 Other symbolic dysfunctions: Secondary | ICD-10-CM | POA: Diagnosis not present

## 2013-07-03 DIAGNOSIS — I6990 Unspecified sequelae of unspecified cerebrovascular disease: Secondary | ICD-10-CM | POA: Diagnosis not present

## 2013-07-03 DIAGNOSIS — R279 Unspecified lack of coordination: Secondary | ICD-10-CM | POA: Diagnosis not present

## 2013-07-03 DIAGNOSIS — M6281 Muscle weakness (generalized): Secondary | ICD-10-CM | POA: Diagnosis not present

## 2013-07-03 DIAGNOSIS — I69998 Other sequelae following unspecified cerebrovascular disease: Secondary | ICD-10-CM | POA: Diagnosis not present

## 2013-07-04 DIAGNOSIS — R488 Other symbolic dysfunctions: Secondary | ICD-10-CM | POA: Diagnosis not present

## 2013-07-04 DIAGNOSIS — I6990 Unspecified sequelae of unspecified cerebrovascular disease: Secondary | ICD-10-CM | POA: Diagnosis not present

## 2013-07-04 DIAGNOSIS — I69998 Other sequelae following unspecified cerebrovascular disease: Secondary | ICD-10-CM | POA: Diagnosis not present

## 2013-07-04 DIAGNOSIS — M6281 Muscle weakness (generalized): Secondary | ICD-10-CM | POA: Diagnosis not present

## 2013-07-04 DIAGNOSIS — R262 Difficulty in walking, not elsewhere classified: Secondary | ICD-10-CM | POA: Diagnosis not present

## 2013-07-04 DIAGNOSIS — R279 Unspecified lack of coordination: Secondary | ICD-10-CM | POA: Diagnosis not present

## 2013-07-08 DIAGNOSIS — I6990 Unspecified sequelae of unspecified cerebrovascular disease: Secondary | ICD-10-CM | POA: Diagnosis not present

## 2013-07-08 DIAGNOSIS — R279 Unspecified lack of coordination: Secondary | ICD-10-CM | POA: Diagnosis not present

## 2013-07-08 DIAGNOSIS — M6281 Muscle weakness (generalized): Secondary | ICD-10-CM | POA: Diagnosis not present

## 2013-07-08 DIAGNOSIS — I69998 Other sequelae following unspecified cerebrovascular disease: Secondary | ICD-10-CM | POA: Diagnosis not present

## 2013-07-08 DIAGNOSIS — R488 Other symbolic dysfunctions: Secondary | ICD-10-CM | POA: Diagnosis not present

## 2013-07-08 DIAGNOSIS — R262 Difficulty in walking, not elsewhere classified: Secondary | ICD-10-CM | POA: Diagnosis not present

## 2013-07-09 DIAGNOSIS — I69998 Other sequelae following unspecified cerebrovascular disease: Secondary | ICD-10-CM | POA: Diagnosis not present

## 2013-07-09 DIAGNOSIS — I6990 Unspecified sequelae of unspecified cerebrovascular disease: Secondary | ICD-10-CM | POA: Diagnosis not present

## 2013-07-09 DIAGNOSIS — R262 Difficulty in walking, not elsewhere classified: Secondary | ICD-10-CM | POA: Diagnosis not present

## 2013-07-09 DIAGNOSIS — R279 Unspecified lack of coordination: Secondary | ICD-10-CM | POA: Diagnosis not present

## 2013-07-09 DIAGNOSIS — M6281 Muscle weakness (generalized): Secondary | ICD-10-CM | POA: Diagnosis not present

## 2013-07-09 DIAGNOSIS — R488 Other symbolic dysfunctions: Secondary | ICD-10-CM | POA: Diagnosis not present

## 2013-07-10 DIAGNOSIS — R279 Unspecified lack of coordination: Secondary | ICD-10-CM | POA: Diagnosis not present

## 2013-07-10 DIAGNOSIS — I6990 Unspecified sequelae of unspecified cerebrovascular disease: Secondary | ICD-10-CM | POA: Diagnosis not present

## 2013-07-10 DIAGNOSIS — I69998 Other sequelae following unspecified cerebrovascular disease: Secondary | ICD-10-CM | POA: Diagnosis not present

## 2013-07-10 DIAGNOSIS — M6281 Muscle weakness (generalized): Secondary | ICD-10-CM | POA: Diagnosis not present

## 2013-07-10 DIAGNOSIS — R488 Other symbolic dysfunctions: Secondary | ICD-10-CM | POA: Diagnosis not present

## 2013-07-10 DIAGNOSIS — R262 Difficulty in walking, not elsewhere classified: Secondary | ICD-10-CM | POA: Diagnosis not present

## 2013-07-11 DIAGNOSIS — I69998 Other sequelae following unspecified cerebrovascular disease: Secondary | ICD-10-CM | POA: Diagnosis not present

## 2013-07-11 DIAGNOSIS — R488 Other symbolic dysfunctions: Secondary | ICD-10-CM | POA: Diagnosis not present

## 2013-07-11 DIAGNOSIS — R279 Unspecified lack of coordination: Secondary | ICD-10-CM | POA: Diagnosis not present

## 2013-07-11 DIAGNOSIS — I6990 Unspecified sequelae of unspecified cerebrovascular disease: Secondary | ICD-10-CM | POA: Diagnosis not present

## 2013-07-11 DIAGNOSIS — R262 Difficulty in walking, not elsewhere classified: Secondary | ICD-10-CM | POA: Diagnosis not present

## 2013-07-11 DIAGNOSIS — M6281 Muscle weakness (generalized): Secondary | ICD-10-CM | POA: Diagnosis not present

## 2013-07-14 DIAGNOSIS — I69998 Other sequelae following unspecified cerebrovascular disease: Secondary | ICD-10-CM | POA: Diagnosis not present

## 2013-07-14 DIAGNOSIS — M6281 Muscle weakness (generalized): Secondary | ICD-10-CM | POA: Diagnosis not present

## 2013-07-14 DIAGNOSIS — R488 Other symbolic dysfunctions: Secondary | ICD-10-CM | POA: Diagnosis not present

## 2013-07-14 DIAGNOSIS — R279 Unspecified lack of coordination: Secondary | ICD-10-CM | POA: Diagnosis not present

## 2013-07-14 DIAGNOSIS — I6990 Unspecified sequelae of unspecified cerebrovascular disease: Secondary | ICD-10-CM | POA: Diagnosis not present

## 2013-07-14 DIAGNOSIS — R262 Difficulty in walking, not elsewhere classified: Secondary | ICD-10-CM | POA: Diagnosis not present

## 2013-07-15 DIAGNOSIS — R488 Other symbolic dysfunctions: Secondary | ICD-10-CM | POA: Diagnosis not present

## 2013-07-15 DIAGNOSIS — R279 Unspecified lack of coordination: Secondary | ICD-10-CM | POA: Diagnosis not present

## 2013-07-15 DIAGNOSIS — M6281 Muscle weakness (generalized): Secondary | ICD-10-CM | POA: Diagnosis not present

## 2013-07-15 DIAGNOSIS — R262 Difficulty in walking, not elsewhere classified: Secondary | ICD-10-CM | POA: Diagnosis not present

## 2013-07-15 DIAGNOSIS — I69998 Other sequelae following unspecified cerebrovascular disease: Secondary | ICD-10-CM | POA: Diagnosis not present

## 2013-07-15 DIAGNOSIS — I6990 Unspecified sequelae of unspecified cerebrovascular disease: Secondary | ICD-10-CM | POA: Diagnosis not present

## 2013-07-16 DIAGNOSIS — R488 Other symbolic dysfunctions: Secondary | ICD-10-CM | POA: Diagnosis not present

## 2013-07-16 DIAGNOSIS — I69998 Other sequelae following unspecified cerebrovascular disease: Secondary | ICD-10-CM | POA: Diagnosis not present

## 2013-07-16 DIAGNOSIS — M6281 Muscle weakness (generalized): Secondary | ICD-10-CM | POA: Diagnosis not present

## 2013-07-16 DIAGNOSIS — R262 Difficulty in walking, not elsewhere classified: Secondary | ICD-10-CM | POA: Diagnosis not present

## 2013-07-16 DIAGNOSIS — I6990 Unspecified sequelae of unspecified cerebrovascular disease: Secondary | ICD-10-CM | POA: Diagnosis not present

## 2013-07-16 DIAGNOSIS — R279 Unspecified lack of coordination: Secondary | ICD-10-CM | POA: Diagnosis not present

## 2013-07-17 DIAGNOSIS — R262 Difficulty in walking, not elsewhere classified: Secondary | ICD-10-CM | POA: Diagnosis not present

## 2013-07-17 DIAGNOSIS — R279 Unspecified lack of coordination: Secondary | ICD-10-CM | POA: Diagnosis not present

## 2013-07-17 DIAGNOSIS — M6281 Muscle weakness (generalized): Secondary | ICD-10-CM | POA: Diagnosis not present

## 2013-07-17 DIAGNOSIS — I69998 Other sequelae following unspecified cerebrovascular disease: Secondary | ICD-10-CM | POA: Diagnosis not present

## 2013-07-17 DIAGNOSIS — I6990 Unspecified sequelae of unspecified cerebrovascular disease: Secondary | ICD-10-CM | POA: Diagnosis not present

## 2013-07-17 DIAGNOSIS — R488 Other symbolic dysfunctions: Secondary | ICD-10-CM | POA: Diagnosis not present

## 2013-07-18 DIAGNOSIS — I6990 Unspecified sequelae of unspecified cerebrovascular disease: Secondary | ICD-10-CM | POA: Diagnosis not present

## 2013-07-18 DIAGNOSIS — R279 Unspecified lack of coordination: Secondary | ICD-10-CM | POA: Diagnosis not present

## 2013-07-18 DIAGNOSIS — R488 Other symbolic dysfunctions: Secondary | ICD-10-CM | POA: Diagnosis not present

## 2013-07-18 DIAGNOSIS — I69998 Other sequelae following unspecified cerebrovascular disease: Secondary | ICD-10-CM | POA: Diagnosis not present

## 2013-07-18 DIAGNOSIS — M6281 Muscle weakness (generalized): Secondary | ICD-10-CM | POA: Diagnosis not present

## 2013-07-18 DIAGNOSIS — R262 Difficulty in walking, not elsewhere classified: Secondary | ICD-10-CM | POA: Diagnosis not present

## 2013-07-19 DIAGNOSIS — I69998 Other sequelae following unspecified cerebrovascular disease: Secondary | ICD-10-CM | POA: Diagnosis not present

## 2013-07-19 DIAGNOSIS — I6990 Unspecified sequelae of unspecified cerebrovascular disease: Secondary | ICD-10-CM | POA: Diagnosis not present

## 2013-07-19 DIAGNOSIS — R488 Other symbolic dysfunctions: Secondary | ICD-10-CM | POA: Diagnosis not present

## 2013-07-19 DIAGNOSIS — M6281 Muscle weakness (generalized): Secondary | ICD-10-CM | POA: Diagnosis not present

## 2013-07-19 DIAGNOSIS — R279 Unspecified lack of coordination: Secondary | ICD-10-CM | POA: Diagnosis not present

## 2013-07-19 DIAGNOSIS — R262 Difficulty in walking, not elsewhere classified: Secondary | ICD-10-CM | POA: Diagnosis not present

## 2013-07-21 DIAGNOSIS — M159 Polyosteoarthritis, unspecified: Secondary | ICD-10-CM | POA: Diagnosis not present

## 2013-07-21 DIAGNOSIS — R262 Difficulty in walking, not elsewhere classified: Secondary | ICD-10-CM | POA: Diagnosis not present

## 2013-07-21 DIAGNOSIS — I69959 Hemiplegia and hemiparesis following unspecified cerebrovascular disease affecting unspecified side: Secondary | ICD-10-CM | POA: Diagnosis not present

## 2013-07-21 DIAGNOSIS — I6990 Unspecified sequelae of unspecified cerebrovascular disease: Secondary | ICD-10-CM | POA: Diagnosis not present

## 2013-07-21 DIAGNOSIS — I69998 Other sequelae following unspecified cerebrovascular disease: Secondary | ICD-10-CM | POA: Diagnosis not present

## 2013-07-21 DIAGNOSIS — R279 Unspecified lack of coordination: Secondary | ICD-10-CM | POA: Diagnosis not present

## 2013-07-21 DIAGNOSIS — R488 Other symbolic dysfunctions: Secondary | ICD-10-CM | POA: Diagnosis not present

## 2013-07-21 DIAGNOSIS — M6281 Muscle weakness (generalized): Secondary | ICD-10-CM | POA: Diagnosis not present

## 2013-07-23 DIAGNOSIS — M6281 Muscle weakness (generalized): Secondary | ICD-10-CM | POA: Diagnosis not present

## 2013-07-23 DIAGNOSIS — I69998 Other sequelae following unspecified cerebrovascular disease: Secondary | ICD-10-CM | POA: Diagnosis not present

## 2013-07-23 DIAGNOSIS — R279 Unspecified lack of coordination: Secondary | ICD-10-CM | POA: Diagnosis not present

## 2013-07-23 DIAGNOSIS — I6990 Unspecified sequelae of unspecified cerebrovascular disease: Secondary | ICD-10-CM | POA: Diagnosis not present

## 2013-07-23 DIAGNOSIS — R262 Difficulty in walking, not elsewhere classified: Secondary | ICD-10-CM | POA: Diagnosis not present

## 2013-07-23 DIAGNOSIS — R488 Other symbolic dysfunctions: Secondary | ICD-10-CM | POA: Diagnosis not present

## 2013-07-24 DIAGNOSIS — R262 Difficulty in walking, not elsewhere classified: Secondary | ICD-10-CM | POA: Diagnosis not present

## 2013-07-24 DIAGNOSIS — I6990 Unspecified sequelae of unspecified cerebrovascular disease: Secondary | ICD-10-CM | POA: Diagnosis not present

## 2013-07-24 DIAGNOSIS — I69998 Other sequelae following unspecified cerebrovascular disease: Secondary | ICD-10-CM | POA: Diagnosis not present

## 2013-07-24 DIAGNOSIS — M6281 Muscle weakness (generalized): Secondary | ICD-10-CM | POA: Diagnosis not present

## 2013-07-24 DIAGNOSIS — R279 Unspecified lack of coordination: Secondary | ICD-10-CM | POA: Diagnosis not present

## 2013-07-24 DIAGNOSIS — R488 Other symbolic dysfunctions: Secondary | ICD-10-CM | POA: Diagnosis not present

## 2013-07-25 DIAGNOSIS — I69998 Other sequelae following unspecified cerebrovascular disease: Secondary | ICD-10-CM | POA: Diagnosis not present

## 2013-07-25 DIAGNOSIS — M6281 Muscle weakness (generalized): Secondary | ICD-10-CM | POA: Diagnosis not present

## 2013-07-25 DIAGNOSIS — R488 Other symbolic dysfunctions: Secondary | ICD-10-CM | POA: Diagnosis not present

## 2013-07-25 DIAGNOSIS — I6990 Unspecified sequelae of unspecified cerebrovascular disease: Secondary | ICD-10-CM | POA: Diagnosis not present

## 2013-07-25 DIAGNOSIS — R279 Unspecified lack of coordination: Secondary | ICD-10-CM | POA: Diagnosis not present

## 2013-07-25 DIAGNOSIS — R262 Difficulty in walking, not elsewhere classified: Secondary | ICD-10-CM | POA: Diagnosis not present

## 2013-07-28 DIAGNOSIS — I69998 Other sequelae following unspecified cerebrovascular disease: Secondary | ICD-10-CM | POA: Diagnosis not present

## 2013-07-28 DIAGNOSIS — R262 Difficulty in walking, not elsewhere classified: Secondary | ICD-10-CM | POA: Diagnosis not present

## 2013-07-28 DIAGNOSIS — I6990 Unspecified sequelae of unspecified cerebrovascular disease: Secondary | ICD-10-CM | POA: Diagnosis not present

## 2013-07-28 DIAGNOSIS — R488 Other symbolic dysfunctions: Secondary | ICD-10-CM | POA: Diagnosis not present

## 2013-07-28 DIAGNOSIS — R279 Unspecified lack of coordination: Secondary | ICD-10-CM | POA: Diagnosis not present

## 2013-07-28 DIAGNOSIS — M6281 Muscle weakness (generalized): Secondary | ICD-10-CM | POA: Diagnosis not present

## 2013-07-29 DIAGNOSIS — M6281 Muscle weakness (generalized): Secondary | ICD-10-CM | POA: Diagnosis not present

## 2013-07-29 DIAGNOSIS — I69998 Other sequelae following unspecified cerebrovascular disease: Secondary | ICD-10-CM | POA: Diagnosis not present

## 2013-07-29 DIAGNOSIS — R262 Difficulty in walking, not elsewhere classified: Secondary | ICD-10-CM | POA: Diagnosis not present

## 2013-07-29 DIAGNOSIS — R279 Unspecified lack of coordination: Secondary | ICD-10-CM | POA: Diagnosis not present

## 2013-07-29 DIAGNOSIS — I6990 Unspecified sequelae of unspecified cerebrovascular disease: Secondary | ICD-10-CM | POA: Diagnosis not present

## 2013-07-29 DIAGNOSIS — R488 Other symbolic dysfunctions: Secondary | ICD-10-CM | POA: Diagnosis not present

## 2013-07-30 DIAGNOSIS — M6281 Muscle weakness (generalized): Secondary | ICD-10-CM | POA: Diagnosis not present

## 2013-07-30 DIAGNOSIS — R488 Other symbolic dysfunctions: Secondary | ICD-10-CM | POA: Diagnosis not present

## 2013-07-30 DIAGNOSIS — I6990 Unspecified sequelae of unspecified cerebrovascular disease: Secondary | ICD-10-CM | POA: Diagnosis not present

## 2013-07-30 DIAGNOSIS — I69998 Other sequelae following unspecified cerebrovascular disease: Secondary | ICD-10-CM | POA: Diagnosis not present

## 2013-07-30 DIAGNOSIS — R279 Unspecified lack of coordination: Secondary | ICD-10-CM | POA: Diagnosis not present

## 2013-07-30 DIAGNOSIS — R262 Difficulty in walking, not elsewhere classified: Secondary | ICD-10-CM | POA: Diagnosis not present

## 2013-07-31 DIAGNOSIS — M6281 Muscle weakness (generalized): Secondary | ICD-10-CM | POA: Diagnosis not present

## 2013-07-31 DIAGNOSIS — I6990 Unspecified sequelae of unspecified cerebrovascular disease: Secondary | ICD-10-CM | POA: Diagnosis not present

## 2013-07-31 DIAGNOSIS — I69998 Other sequelae following unspecified cerebrovascular disease: Secondary | ICD-10-CM | POA: Diagnosis not present

## 2013-07-31 DIAGNOSIS — R262 Difficulty in walking, not elsewhere classified: Secondary | ICD-10-CM | POA: Diagnosis not present

## 2013-07-31 DIAGNOSIS — R488 Other symbolic dysfunctions: Secondary | ICD-10-CM | POA: Diagnosis not present

## 2013-07-31 DIAGNOSIS — R279 Unspecified lack of coordination: Secondary | ICD-10-CM | POA: Diagnosis not present

## 2013-08-01 DIAGNOSIS — R488 Other symbolic dysfunctions: Secondary | ICD-10-CM | POA: Diagnosis not present

## 2013-08-01 DIAGNOSIS — R262 Difficulty in walking, not elsewhere classified: Secondary | ICD-10-CM | POA: Diagnosis not present

## 2013-08-01 DIAGNOSIS — I6990 Unspecified sequelae of unspecified cerebrovascular disease: Secondary | ICD-10-CM | POA: Diagnosis not present

## 2013-08-01 DIAGNOSIS — M6281 Muscle weakness (generalized): Secondary | ICD-10-CM | POA: Diagnosis not present

## 2013-08-01 DIAGNOSIS — I69998 Other sequelae following unspecified cerebrovascular disease: Secondary | ICD-10-CM | POA: Diagnosis not present

## 2013-08-01 DIAGNOSIS — R279 Unspecified lack of coordination: Secondary | ICD-10-CM | POA: Diagnosis not present

## 2013-08-04 DIAGNOSIS — R279 Unspecified lack of coordination: Secondary | ICD-10-CM | POA: Diagnosis not present

## 2013-08-04 DIAGNOSIS — R262 Difficulty in walking, not elsewhere classified: Secondary | ICD-10-CM | POA: Diagnosis not present

## 2013-08-04 DIAGNOSIS — I6990 Unspecified sequelae of unspecified cerebrovascular disease: Secondary | ICD-10-CM | POA: Diagnosis not present

## 2013-08-04 DIAGNOSIS — R488 Other symbolic dysfunctions: Secondary | ICD-10-CM | POA: Diagnosis not present

## 2013-08-04 DIAGNOSIS — I69998 Other sequelae following unspecified cerebrovascular disease: Secondary | ICD-10-CM | POA: Diagnosis not present

## 2013-08-04 DIAGNOSIS — M6281 Muscle weakness (generalized): Secondary | ICD-10-CM | POA: Diagnosis not present

## 2013-08-05 DIAGNOSIS — I69998 Other sequelae following unspecified cerebrovascular disease: Secondary | ICD-10-CM | POA: Diagnosis not present

## 2013-08-05 DIAGNOSIS — R488 Other symbolic dysfunctions: Secondary | ICD-10-CM | POA: Diagnosis not present

## 2013-08-05 DIAGNOSIS — I6990 Unspecified sequelae of unspecified cerebrovascular disease: Secondary | ICD-10-CM | POA: Diagnosis not present

## 2013-08-05 DIAGNOSIS — R279 Unspecified lack of coordination: Secondary | ICD-10-CM | POA: Diagnosis not present

## 2013-08-05 DIAGNOSIS — M6281 Muscle weakness (generalized): Secondary | ICD-10-CM | POA: Diagnosis not present

## 2013-08-05 DIAGNOSIS — R262 Difficulty in walking, not elsewhere classified: Secondary | ICD-10-CM | POA: Diagnosis not present

## 2013-08-06 DIAGNOSIS — R488 Other symbolic dysfunctions: Secondary | ICD-10-CM | POA: Diagnosis not present

## 2013-08-06 DIAGNOSIS — I69998 Other sequelae following unspecified cerebrovascular disease: Secondary | ICD-10-CM | POA: Diagnosis not present

## 2013-08-06 DIAGNOSIS — R262 Difficulty in walking, not elsewhere classified: Secondary | ICD-10-CM | POA: Diagnosis not present

## 2013-08-06 DIAGNOSIS — M6281 Muscle weakness (generalized): Secondary | ICD-10-CM | POA: Diagnosis not present

## 2013-08-06 DIAGNOSIS — I6990 Unspecified sequelae of unspecified cerebrovascular disease: Secondary | ICD-10-CM | POA: Diagnosis not present

## 2013-08-06 DIAGNOSIS — R279 Unspecified lack of coordination: Secondary | ICD-10-CM | POA: Diagnosis not present

## 2013-08-07 DIAGNOSIS — I69998 Other sequelae following unspecified cerebrovascular disease: Secondary | ICD-10-CM | POA: Diagnosis not present

## 2013-08-07 DIAGNOSIS — R488 Other symbolic dysfunctions: Secondary | ICD-10-CM | POA: Diagnosis not present

## 2013-08-07 DIAGNOSIS — I6990 Unspecified sequelae of unspecified cerebrovascular disease: Secondary | ICD-10-CM | POA: Diagnosis not present

## 2013-08-07 DIAGNOSIS — M6281 Muscle weakness (generalized): Secondary | ICD-10-CM | POA: Diagnosis not present

## 2013-08-07 DIAGNOSIS — R262 Difficulty in walking, not elsewhere classified: Secondary | ICD-10-CM | POA: Diagnosis not present

## 2013-08-07 DIAGNOSIS — R279 Unspecified lack of coordination: Secondary | ICD-10-CM | POA: Diagnosis not present

## 2013-08-08 DIAGNOSIS — R262 Difficulty in walking, not elsewhere classified: Secondary | ICD-10-CM | POA: Diagnosis not present

## 2013-08-08 DIAGNOSIS — I69998 Other sequelae following unspecified cerebrovascular disease: Secondary | ICD-10-CM | POA: Diagnosis not present

## 2013-08-08 DIAGNOSIS — M6281 Muscle weakness (generalized): Secondary | ICD-10-CM | POA: Diagnosis not present

## 2013-08-08 DIAGNOSIS — R488 Other symbolic dysfunctions: Secondary | ICD-10-CM | POA: Diagnosis not present

## 2013-08-08 DIAGNOSIS — I6990 Unspecified sequelae of unspecified cerebrovascular disease: Secondary | ICD-10-CM | POA: Diagnosis not present

## 2013-08-08 DIAGNOSIS — R279 Unspecified lack of coordination: Secondary | ICD-10-CM | POA: Diagnosis not present

## 2013-08-11 DIAGNOSIS — I6990 Unspecified sequelae of unspecified cerebrovascular disease: Secondary | ICD-10-CM | POA: Diagnosis not present

## 2013-08-11 DIAGNOSIS — R609 Edema, unspecified: Secondary | ICD-10-CM | POA: Diagnosis not present

## 2013-08-11 DIAGNOSIS — N183 Chronic kidney disease, stage 3 unspecified: Secondary | ICD-10-CM | POA: Diagnosis not present

## 2013-08-11 DIAGNOSIS — R262 Difficulty in walking, not elsewhere classified: Secondary | ICD-10-CM | POA: Diagnosis not present

## 2013-08-11 DIAGNOSIS — I69998 Other sequelae following unspecified cerebrovascular disease: Secondary | ICD-10-CM | POA: Diagnosis not present

## 2013-08-11 DIAGNOSIS — H3530 Unspecified macular degeneration: Secondary | ICD-10-CM | POA: Diagnosis not present

## 2013-08-11 DIAGNOSIS — R42 Dizziness and giddiness: Secondary | ICD-10-CM | POA: Diagnosis not present

## 2013-08-11 DIAGNOSIS — I129 Hypertensive chronic kidney disease with stage 1 through stage 4 chronic kidney disease, or unspecified chronic kidney disease: Secondary | ICD-10-CM | POA: Diagnosis not present

## 2013-08-11 DIAGNOSIS — E119 Type 2 diabetes mellitus without complications: Secondary | ICD-10-CM | POA: Diagnosis not present

## 2013-08-11 DIAGNOSIS — R488 Other symbolic dysfunctions: Secondary | ICD-10-CM | POA: Diagnosis not present

## 2013-08-11 DIAGNOSIS — R634 Abnormal weight loss: Secondary | ICD-10-CM | POA: Diagnosis not present

## 2013-08-11 DIAGNOSIS — E039 Hypothyroidism, unspecified: Secondary | ICD-10-CM | POA: Diagnosis not present

## 2013-08-11 DIAGNOSIS — M6281 Muscle weakness (generalized): Secondary | ICD-10-CM | POA: Diagnosis not present

## 2013-08-11 DIAGNOSIS — R279 Unspecified lack of coordination: Secondary | ICD-10-CM | POA: Diagnosis not present

## 2013-08-12 DIAGNOSIS — I6990 Unspecified sequelae of unspecified cerebrovascular disease: Secondary | ICD-10-CM | POA: Diagnosis not present

## 2013-08-12 DIAGNOSIS — R488 Other symbolic dysfunctions: Secondary | ICD-10-CM | POA: Diagnosis not present

## 2013-08-12 DIAGNOSIS — I69998 Other sequelae following unspecified cerebrovascular disease: Secondary | ICD-10-CM | POA: Diagnosis not present

## 2013-08-12 DIAGNOSIS — R262 Difficulty in walking, not elsewhere classified: Secondary | ICD-10-CM | POA: Diagnosis not present

## 2013-08-12 DIAGNOSIS — M6281 Muscle weakness (generalized): Secondary | ICD-10-CM | POA: Diagnosis not present

## 2013-08-12 DIAGNOSIS — R279 Unspecified lack of coordination: Secondary | ICD-10-CM | POA: Diagnosis not present

## 2013-08-12 DIAGNOSIS — N181 Chronic kidney disease, stage 1: Secondary | ICD-10-CM | POA: Diagnosis not present

## 2013-08-13 DIAGNOSIS — M6281 Muscle weakness (generalized): Secondary | ICD-10-CM | POA: Diagnosis not present

## 2013-08-13 DIAGNOSIS — I6990 Unspecified sequelae of unspecified cerebrovascular disease: Secondary | ICD-10-CM | POA: Diagnosis not present

## 2013-08-13 DIAGNOSIS — R488 Other symbolic dysfunctions: Secondary | ICD-10-CM | POA: Diagnosis not present

## 2013-08-13 DIAGNOSIS — R262 Difficulty in walking, not elsewhere classified: Secondary | ICD-10-CM | POA: Diagnosis not present

## 2013-08-13 DIAGNOSIS — I69998 Other sequelae following unspecified cerebrovascular disease: Secondary | ICD-10-CM | POA: Diagnosis not present

## 2013-08-13 DIAGNOSIS — R279 Unspecified lack of coordination: Secondary | ICD-10-CM | POA: Diagnosis not present

## 2013-08-14 DIAGNOSIS — R488 Other symbolic dysfunctions: Secondary | ICD-10-CM | POA: Diagnosis not present

## 2013-08-14 DIAGNOSIS — R262 Difficulty in walking, not elsewhere classified: Secondary | ICD-10-CM | POA: Diagnosis not present

## 2013-08-14 DIAGNOSIS — R279 Unspecified lack of coordination: Secondary | ICD-10-CM | POA: Diagnosis not present

## 2013-08-14 DIAGNOSIS — M6281 Muscle weakness (generalized): Secondary | ICD-10-CM | POA: Diagnosis not present

## 2013-08-14 DIAGNOSIS — I69998 Other sequelae following unspecified cerebrovascular disease: Secondary | ICD-10-CM | POA: Diagnosis not present

## 2013-08-14 DIAGNOSIS — I6990 Unspecified sequelae of unspecified cerebrovascular disease: Secondary | ICD-10-CM | POA: Diagnosis not present

## 2013-08-15 DIAGNOSIS — R262 Difficulty in walking, not elsewhere classified: Secondary | ICD-10-CM | POA: Diagnosis not present

## 2013-08-15 DIAGNOSIS — I6990 Unspecified sequelae of unspecified cerebrovascular disease: Secondary | ICD-10-CM | POA: Diagnosis not present

## 2013-08-15 DIAGNOSIS — M6281 Muscle weakness (generalized): Secondary | ICD-10-CM | POA: Diagnosis not present

## 2013-08-15 DIAGNOSIS — R488 Other symbolic dysfunctions: Secondary | ICD-10-CM | POA: Diagnosis not present

## 2013-08-15 DIAGNOSIS — I69998 Other sequelae following unspecified cerebrovascular disease: Secondary | ICD-10-CM | POA: Diagnosis not present

## 2013-08-15 DIAGNOSIS — R279 Unspecified lack of coordination: Secondary | ICD-10-CM | POA: Diagnosis not present

## 2013-08-16 DIAGNOSIS — M6281 Muscle weakness (generalized): Secondary | ICD-10-CM | POA: Diagnosis not present

## 2013-08-16 DIAGNOSIS — R262 Difficulty in walking, not elsewhere classified: Secondary | ICD-10-CM | POA: Diagnosis not present

## 2013-08-16 DIAGNOSIS — R279 Unspecified lack of coordination: Secondary | ICD-10-CM | POA: Diagnosis not present

## 2013-08-16 DIAGNOSIS — I69998 Other sequelae following unspecified cerebrovascular disease: Secondary | ICD-10-CM | POA: Diagnosis not present

## 2013-08-16 DIAGNOSIS — I6990 Unspecified sequelae of unspecified cerebrovascular disease: Secondary | ICD-10-CM | POA: Diagnosis not present

## 2013-08-16 DIAGNOSIS — R488 Other symbolic dysfunctions: Secondary | ICD-10-CM | POA: Diagnosis not present

## 2013-08-18 DIAGNOSIS — M6281 Muscle weakness (generalized): Secondary | ICD-10-CM | POA: Diagnosis not present

## 2013-08-18 DIAGNOSIS — R279 Unspecified lack of coordination: Secondary | ICD-10-CM | POA: Diagnosis not present

## 2013-08-18 DIAGNOSIS — R488 Other symbolic dysfunctions: Secondary | ICD-10-CM | POA: Diagnosis not present

## 2013-08-18 DIAGNOSIS — I6990 Unspecified sequelae of unspecified cerebrovascular disease: Secondary | ICD-10-CM | POA: Diagnosis not present

## 2013-08-18 DIAGNOSIS — I69998 Other sequelae following unspecified cerebrovascular disease: Secondary | ICD-10-CM | POA: Diagnosis not present

## 2013-08-19 DIAGNOSIS — I69998 Other sequelae following unspecified cerebrovascular disease: Secondary | ICD-10-CM | POA: Diagnosis not present

## 2013-08-19 DIAGNOSIS — I6990 Unspecified sequelae of unspecified cerebrovascular disease: Secondary | ICD-10-CM | POA: Diagnosis not present

## 2013-08-19 DIAGNOSIS — D485 Neoplasm of uncertain behavior of skin: Secondary | ICD-10-CM | POA: Diagnosis not present

## 2013-08-19 DIAGNOSIS — L570 Actinic keratosis: Secondary | ICD-10-CM | POA: Diagnosis not present

## 2013-08-19 DIAGNOSIS — R279 Unspecified lack of coordination: Secondary | ICD-10-CM | POA: Diagnosis not present

## 2013-08-19 DIAGNOSIS — M6281 Muscle weakness (generalized): Secondary | ICD-10-CM | POA: Diagnosis not present

## 2013-08-19 DIAGNOSIS — C44320 Squamous cell carcinoma of skin of unspecified parts of face: Secondary | ICD-10-CM | POA: Diagnosis not present

## 2013-08-19 DIAGNOSIS — R488 Other symbolic dysfunctions: Secondary | ICD-10-CM | POA: Diagnosis not present

## 2013-08-20 DIAGNOSIS — I6990 Unspecified sequelae of unspecified cerebrovascular disease: Secondary | ICD-10-CM | POA: Diagnosis not present

## 2013-08-20 DIAGNOSIS — M6281 Muscle weakness (generalized): Secondary | ICD-10-CM | POA: Diagnosis not present

## 2013-08-20 DIAGNOSIS — I69998 Other sequelae following unspecified cerebrovascular disease: Secondary | ICD-10-CM | POA: Diagnosis not present

## 2013-08-20 DIAGNOSIS — R279 Unspecified lack of coordination: Secondary | ICD-10-CM | POA: Diagnosis not present

## 2013-08-20 DIAGNOSIS — R488 Other symbolic dysfunctions: Secondary | ICD-10-CM | POA: Diagnosis not present

## 2013-08-21 DIAGNOSIS — M6281 Muscle weakness (generalized): Secondary | ICD-10-CM | POA: Diagnosis not present

## 2013-08-21 DIAGNOSIS — R279 Unspecified lack of coordination: Secondary | ICD-10-CM | POA: Diagnosis not present

## 2013-08-21 DIAGNOSIS — C44211 Basal cell carcinoma of skin of unspecified ear and external auricular canal: Secondary | ICD-10-CM | POA: Diagnosis not present

## 2013-08-21 DIAGNOSIS — I6990 Unspecified sequelae of unspecified cerebrovascular disease: Secondary | ICD-10-CM | POA: Diagnosis not present

## 2013-08-21 DIAGNOSIS — C44319 Basal cell carcinoma of skin of other parts of face: Secondary | ICD-10-CM | POA: Diagnosis not present

## 2013-08-21 DIAGNOSIS — I69998 Other sequelae following unspecified cerebrovascular disease: Secondary | ICD-10-CM | POA: Diagnosis not present

## 2013-08-21 DIAGNOSIS — R488 Other symbolic dysfunctions: Secondary | ICD-10-CM | POA: Diagnosis not present

## 2013-08-24 DIAGNOSIS — R279 Unspecified lack of coordination: Secondary | ICD-10-CM | POA: Diagnosis not present

## 2013-08-24 DIAGNOSIS — R488 Other symbolic dysfunctions: Secondary | ICD-10-CM | POA: Diagnosis not present

## 2013-08-24 DIAGNOSIS — M6281 Muscle weakness (generalized): Secondary | ICD-10-CM | POA: Diagnosis not present

## 2013-08-24 DIAGNOSIS — I6990 Unspecified sequelae of unspecified cerebrovascular disease: Secondary | ICD-10-CM | POA: Diagnosis not present

## 2013-08-24 DIAGNOSIS — I69998 Other sequelae following unspecified cerebrovascular disease: Secondary | ICD-10-CM | POA: Diagnosis not present

## 2013-08-25 DIAGNOSIS — R279 Unspecified lack of coordination: Secondary | ICD-10-CM | POA: Diagnosis not present

## 2013-08-25 DIAGNOSIS — M6281 Muscle weakness (generalized): Secondary | ICD-10-CM | POA: Diagnosis not present

## 2013-08-25 DIAGNOSIS — I69998 Other sequelae following unspecified cerebrovascular disease: Secondary | ICD-10-CM | POA: Diagnosis not present

## 2013-08-25 DIAGNOSIS — I6990 Unspecified sequelae of unspecified cerebrovascular disease: Secondary | ICD-10-CM | POA: Diagnosis not present

## 2013-08-25 DIAGNOSIS — R488 Other symbolic dysfunctions: Secondary | ICD-10-CM | POA: Diagnosis not present

## 2013-08-28 DIAGNOSIS — B351 Tinea unguium: Secondary | ICD-10-CM | POA: Diagnosis not present

## 2013-08-28 DIAGNOSIS — M79609 Pain in unspecified limb: Secondary | ICD-10-CM | POA: Diagnosis not present

## 2013-08-29 DIAGNOSIS — R279 Unspecified lack of coordination: Secondary | ICD-10-CM | POA: Diagnosis not present

## 2013-08-29 DIAGNOSIS — I69998 Other sequelae following unspecified cerebrovascular disease: Secondary | ICD-10-CM | POA: Diagnosis not present

## 2013-08-29 DIAGNOSIS — I6990 Unspecified sequelae of unspecified cerebrovascular disease: Secondary | ICD-10-CM | POA: Diagnosis not present

## 2013-08-29 DIAGNOSIS — R488 Other symbolic dysfunctions: Secondary | ICD-10-CM | POA: Diagnosis not present

## 2013-08-29 DIAGNOSIS — M6281 Muscle weakness (generalized): Secondary | ICD-10-CM | POA: Diagnosis not present

## 2013-09-01 DIAGNOSIS — I69998 Other sequelae following unspecified cerebrovascular disease: Secondary | ICD-10-CM | POA: Diagnosis not present

## 2013-09-01 DIAGNOSIS — R488 Other symbolic dysfunctions: Secondary | ICD-10-CM | POA: Diagnosis not present

## 2013-09-01 DIAGNOSIS — I6990 Unspecified sequelae of unspecified cerebrovascular disease: Secondary | ICD-10-CM | POA: Diagnosis not present

## 2013-09-01 DIAGNOSIS — M6281 Muscle weakness (generalized): Secondary | ICD-10-CM | POA: Diagnosis not present

## 2013-09-01 DIAGNOSIS — R279 Unspecified lack of coordination: Secondary | ICD-10-CM | POA: Diagnosis not present

## 2013-09-03 DIAGNOSIS — R279 Unspecified lack of coordination: Secondary | ICD-10-CM | POA: Diagnosis not present

## 2013-09-03 DIAGNOSIS — R488 Other symbolic dysfunctions: Secondary | ICD-10-CM | POA: Diagnosis not present

## 2013-09-03 DIAGNOSIS — M6281 Muscle weakness (generalized): Secondary | ICD-10-CM | POA: Diagnosis not present

## 2013-09-03 DIAGNOSIS — I69998 Other sequelae following unspecified cerebrovascular disease: Secondary | ICD-10-CM | POA: Diagnosis not present

## 2013-09-03 DIAGNOSIS — I6990 Unspecified sequelae of unspecified cerebrovascular disease: Secondary | ICD-10-CM | POA: Diagnosis not present

## 2013-09-04 ENCOUNTER — Encounter: Payer: Self-pay | Admitting: Family Medicine

## 2013-09-04 ENCOUNTER — Ambulatory Visit (INDEPENDENT_AMBULATORY_CARE_PROVIDER_SITE_OTHER): Payer: Medicare Other | Admitting: Family Medicine

## 2013-09-04 VITALS — BP 133/64 | HR 64 | Temp 97.5°F | Resp 18 | Ht 65.0 in | Wt 158.0 lb

## 2013-09-04 DIAGNOSIS — M25512 Pain in left shoulder: Principal | ICD-10-CM

## 2013-09-04 DIAGNOSIS — G8929 Other chronic pain: Secondary | ICD-10-CM

## 2013-09-04 DIAGNOSIS — M7542 Impingement syndrome of left shoulder: Secondary | ICD-10-CM

## 2013-09-04 DIAGNOSIS — Z9189 Other specified personal risk factors, not elsewhere classified: Secondary | ICD-10-CM | POA: Diagnosis not present

## 2013-09-04 DIAGNOSIS — I10 Essential (primary) hypertension: Secondary | ICD-10-CM | POA: Diagnosis not present

## 2013-09-04 DIAGNOSIS — M25819 Other specified joint disorders, unspecified shoulder: Secondary | ICD-10-CM | POA: Diagnosis not present

## 2013-09-04 DIAGNOSIS — M25519 Pain in unspecified shoulder: Secondary | ICD-10-CM

## 2013-09-04 DIAGNOSIS — Z8659 Personal history of other mental and behavioral disorders: Secondary | ICD-10-CM

## 2013-09-04 DIAGNOSIS — I872 Venous insufficiency (chronic) (peripheral): Secondary | ICD-10-CM

## 2013-09-04 DIAGNOSIS — M7580 Other shoulder lesions, unspecified shoulder: Secondary | ICD-10-CM

## 2013-09-04 DIAGNOSIS — G459 Transient cerebral ischemic attack, unspecified: Secondary | ICD-10-CM

## 2013-09-04 MED ORDER — FUROSEMIDE 20 MG PO TABS: ORAL_TABLET | ORAL | Status: DC

## 2013-09-04 NOTE — Progress Notes (Signed)
Pre visit review using our clinic review tool, if applicable. No additional management support is needed unless otherwise documented below in the visit note. 

## 2013-09-04 NOTE — Progress Notes (Signed)
Office Note 09/14/2013  CC:  Chief Complaint  Patient presents with  . Establish Care    HPI:  Lance Kemp is a 80 y.o. White male who is here to establish care. Patient's most recent primary MD: in [place] here last fall to [facility].   Old records were not reviewed prior to or during today's visit.  Problem: ongoing/chronic left shoulder pain since a fall 02/2013 (when trying to put pants on standing up, he fell over onto a wooden cedar chest directly on his left shoulder, then rolled onto the floor), x-ray was done in [place], apparently no acute problems.  PT for 6 months has not helped his pain (2-3 times per week).  The shoulder did not bother him any prior to the fall.  Shoulder hurts only when he abducts it.  Has some trapezius/shoulder/deltoid area numbness/tingling. His sleep is not impaired.  No neck pain.  Past Medical History  Diagnosis Date  . Osteoarthritis     hands/wrists  . Depression     antidepressant x 1 yr--situational. Resolved 08/2013 per pt/son.  . Glaucoma   . Hypertension   . TIA (transient ischemic attack)   . Urine incontinence   . Frequent headaches   . Chronic venous insufficiency     compression hose    Past Surgical History  Procedure Laterality Date  . Cholecystectomy    . Tonsilectomy, adenoidectomy, bilateral myringotomy and tubes      Family History  Problem Relation Age of Onset  . Cancer Mother   . Heart disease Mother     History   Social History  . Marital Status: Single    Spouse Name: N/A    Number of Children: N/A  . Years of Education: N/A   Occupational History  . Not on file.   Social History Main Topics  . Smoking status: Former Research scientist (life sciences)  . Smokeless tobacco: Never Used  . Alcohol Use: No  . Drug Use: No  . Sexual Activity: No   Other Topics Concern  . Not on file   Social History Narrative   Widower.   Relocated to [place] from [place] 02/2013 to live in [place] (falls while living  alone led to this).   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   Pipe smoker until 2014.   Alcohol: social drinker until his 29s, then no alcohol.          Outpatient Encounter Prescriptions as of 09/04/2013  Medication Sig  . azelastine (OPTIVAR) 0.05 % ophthalmic solution   . BETOPTIC-S 0.25 % ophthalmic suspension   . furosemide (LASIX) 20 MG tablet 1 tab po qod for chronic lower extremity swelling  . hydrALAZINE (APRESOLINE) 10 MG tablet   . loperamide (IMODIUM) 2 MG capsule   . valsartan (DIOVAN) 40 MG tablet   . [DISCONTINUED] furosemide (LASIX) 20 MG tablet   . [DISCONTINUED] sertraline (ZOLOFT) 50 MG tablet    ROS Review of Systems  Constitutional: Negative for fever and fatigue.  HENT: Negative for congestion and sore throat.   Eyes: Negative for visual disturbance.  Respiratory: Negative for cough.   Cardiovascular: Negative for chest pain.  Gastrointestinal: Negative for nausea and abdominal pain.  Genitourinary: Negative for dysuria.  Musculoskeletal: Negative for back pain and joint swelling.  Skin: Negative for rash.  Neurological: Negative for headaches.  Hematological: Negative for adenopathy.    PE; Blood pressure 133/64, pulse 64, temperature 97.5 F (36.4 C), temperature source Oral, resp. rate 18, height 5\' 5"  (  1.651 m), weight 158 lb (71.668 kg), SpO2 96.00%. Gen: Alert, well appearing.  Patient is oriented to person, place, time, and situation. CV: RRR, no m/r/g.   LUNGS: CTA bilat, nonlabored resps, good aeration in all lung fields. EXT: 2-3+ bilat LE pitting edema without rash. Left shoulder mildly tender diffusely anteriorly and lateraly.  +Impingement signs.  Very weak on abduction and nearly has a + drop arm sign.  O'brien's test positive on left.  Pertinent labs:  None today  ASSESSMENT AND PLAN:   New pt: obtain old records.  Chronic left shoulder pain Arrange MRI left shoulder to eval for labral injury and/or rotator cuff  tear. Continue tylenol, as this seems to be sufficient for pain control.  HTN (hypertension), benign The current medical regimen is effective;  continue present plan and medications. BMET today. Start ASA 81mg  qd.  Venous insufficiency of both lower extremities Reviewed basics of minimizing this problem. Lasix ok for qod dosing.  History of episode of depression Pt and son feel adamant that he no longer needs his antidepressant---he apparently had some situational mood depression at one point but apparently is over this. OK to d/c sertraline.  An After Visit Summary was printed and given to the patient.  Spent 35 min with pt today, with >50% of this time spent in counseling and care coordination regarding the above problems.  Return in about 2 weeks (around 09/18/2013) for routine chronic illness f/u.

## 2013-09-05 ENCOUNTER — Telehealth: Payer: Self-pay | Admitting: Family Medicine

## 2013-09-05 DIAGNOSIS — M6281 Muscle weakness (generalized): Secondary | ICD-10-CM | POA: Diagnosis not present

## 2013-09-05 DIAGNOSIS — R279 Unspecified lack of coordination: Secondary | ICD-10-CM | POA: Diagnosis not present

## 2013-09-05 DIAGNOSIS — I69998 Other sequelae following unspecified cerebrovascular disease: Secondary | ICD-10-CM | POA: Diagnosis not present

## 2013-09-05 DIAGNOSIS — I6990 Unspecified sequelae of unspecified cerebrovascular disease: Secondary | ICD-10-CM | POA: Diagnosis not present

## 2013-09-05 DIAGNOSIS — R488 Other symbolic dysfunctions: Secondary | ICD-10-CM | POA: Diagnosis not present

## 2013-09-05 LAB — CBC WITH DIFFERENTIAL/PLATELET
BASOS ABS: 0.1 10*3/uL (ref 0.0–0.1)
Basophils Relative: 0.7 % (ref 0.0–3.0)
Eosinophils Absolute: 0.4 10*3/uL (ref 0.0–0.7)
Eosinophils Relative: 4.7 % (ref 0.0–5.0)
HCT: 33.1 % — ABNORMAL LOW (ref 39.0–52.0)
HEMOGLOBIN: 11.2 g/dL — AB (ref 13.0–17.0)
Lymphocytes Relative: 16.2 % (ref 12.0–46.0)
Lymphs Abs: 1.3 10*3/uL (ref 0.7–4.0)
MCHC: 33.9 g/dL (ref 30.0–36.0)
MCV: 92.3 fl (ref 78.0–100.0)
MONOS PCT: 5.3 % (ref 3.0–12.0)
Monocytes Absolute: 0.4 10*3/uL (ref 0.1–1.0)
NEUTROS ABS: 5.9 10*3/uL (ref 1.4–7.7)
Neutrophils Relative %: 73.1 % (ref 43.0–77.0)
Platelets: 224 10*3/uL (ref 150.0–400.0)
RBC: 3.58 Mil/uL — ABNORMAL LOW (ref 4.22–5.81)
RDW: 13.6 % (ref 11.5–14.6)
WBC: 8 10*3/uL (ref 4.5–10.5)

## 2013-09-05 LAB — BASIC METABOLIC PANEL
BUN: 21 mg/dL (ref 6–23)
CO2: 27 meq/L (ref 19–32)
CREATININE: 1.4 mg/dL (ref 0.4–1.5)
Calcium: 8.9 mg/dL (ref 8.4–10.5)
Chloride: 100 mEq/L (ref 96–112)
GFR: 49.14 mL/min — ABNORMAL LOW (ref >60.00)
Glucose, Bld: 97 mg/dL (ref 70–99)
Potassium: 5.2 mEq/L — ABNORMAL HIGH (ref 3.5–5.1)
SODIUM: 133 meq/L — AB (ref 135–145)

## 2013-09-05 NOTE — Telephone Encounter (Signed)
Relevant patient education mailed to patient.  

## 2013-09-09 DIAGNOSIS — R279 Unspecified lack of coordination: Secondary | ICD-10-CM | POA: Diagnosis not present

## 2013-09-09 DIAGNOSIS — I6990 Unspecified sequelae of unspecified cerebrovascular disease: Secondary | ICD-10-CM | POA: Diagnosis not present

## 2013-09-09 DIAGNOSIS — M6281 Muscle weakness (generalized): Secondary | ICD-10-CM | POA: Diagnosis not present

## 2013-09-09 DIAGNOSIS — I69998 Other sequelae following unspecified cerebrovascular disease: Secondary | ICD-10-CM | POA: Diagnosis not present

## 2013-09-09 DIAGNOSIS — R488 Other symbolic dysfunctions: Secondary | ICD-10-CM | POA: Diagnosis not present

## 2013-09-10 DIAGNOSIS — M6281 Muscle weakness (generalized): Secondary | ICD-10-CM | POA: Diagnosis not present

## 2013-09-10 DIAGNOSIS — R488 Other symbolic dysfunctions: Secondary | ICD-10-CM | POA: Diagnosis not present

## 2013-09-10 DIAGNOSIS — R279 Unspecified lack of coordination: Secondary | ICD-10-CM | POA: Diagnosis not present

## 2013-09-10 DIAGNOSIS — I6990 Unspecified sequelae of unspecified cerebrovascular disease: Secondary | ICD-10-CM | POA: Diagnosis not present

## 2013-09-10 DIAGNOSIS — I69998 Other sequelae following unspecified cerebrovascular disease: Secondary | ICD-10-CM | POA: Diagnosis not present

## 2013-09-12 DIAGNOSIS — M6281 Muscle weakness (generalized): Secondary | ICD-10-CM | POA: Diagnosis not present

## 2013-09-12 DIAGNOSIS — I6990 Unspecified sequelae of unspecified cerebrovascular disease: Secondary | ICD-10-CM | POA: Diagnosis not present

## 2013-09-12 DIAGNOSIS — R279 Unspecified lack of coordination: Secondary | ICD-10-CM | POA: Diagnosis not present

## 2013-09-12 DIAGNOSIS — I69998 Other sequelae following unspecified cerebrovascular disease: Secondary | ICD-10-CM | POA: Diagnosis not present

## 2013-09-12 DIAGNOSIS — R488 Other symbolic dysfunctions: Secondary | ICD-10-CM | POA: Diagnosis not present

## 2013-09-14 DIAGNOSIS — I10 Essential (primary) hypertension: Secondary | ICD-10-CM | POA: Insufficient documentation

## 2013-09-14 DIAGNOSIS — G8929 Other chronic pain: Secondary | ICD-10-CM | POA: Insufficient documentation

## 2013-09-14 DIAGNOSIS — Z8659 Personal history of other mental and behavioral disorders: Secondary | ICD-10-CM | POA: Insufficient documentation

## 2013-09-14 DIAGNOSIS — M25512 Pain in left shoulder: Principal | ICD-10-CM

## 2013-09-14 DIAGNOSIS — I872 Venous insufficiency (chronic) (peripheral): Secondary | ICD-10-CM | POA: Insufficient documentation

## 2013-09-14 HISTORY — DX: Other chronic pain: G89.29

## 2013-09-14 NOTE — Assessment & Plan Note (Signed)
Pt and son feel adamant that he no longer needs his antidepressant---he apparently had some situational mood depression at one point but apparently is over this. OK to d/c sertraline.

## 2013-09-14 NOTE — Assessment & Plan Note (Signed)
Reviewed basics of minimizing this problem. Lasix ok for qod dosing.

## 2013-09-14 NOTE — Assessment & Plan Note (Addendum)
The current medical regimen is effective;  continue present plan and medications. BMET today. Start ASA 81mg  qd.

## 2013-09-14 NOTE — Assessment & Plan Note (Addendum)
Arrange MRI left shoulder to eval for labral injury and/or rotator cuff tear. Continue tylenol, as this seems to be sufficient for pain control.

## 2013-09-17 ENCOUNTER — Ambulatory Visit (HOSPITAL_COMMUNITY)
Admission: RE | Admit: 2013-09-17 | Discharge: 2013-09-17 | Disposition: A | Discharge status: Home / Self Care | Payer: Medicare Other | Source: Ambulatory Visit | Attending: Family Medicine | Admitting: Family Medicine

## 2013-09-17 DIAGNOSIS — S46819A Strain of other muscles, fascia and tendons at shoulder and upper arm level, unspecified arm, initial encounter: Secondary | ICD-10-CM | POA: Diagnosis not present

## 2013-09-17 DIAGNOSIS — M259 Joint disorder, unspecified: Secondary | ICD-10-CM | POA: Insufficient documentation

## 2013-09-17 DIAGNOSIS — M659 Unspecified synovitis and tenosynovitis, unspecified site: Secondary | ICD-10-CM | POA: Insufficient documentation

## 2013-09-17 DIAGNOSIS — M7542 Impingement syndrome of left shoulder: Secondary | ICD-10-CM

## 2013-09-17 DIAGNOSIS — M6688 Spontaneous rupture of other tendons, other: Secondary | ICD-10-CM | POA: Diagnosis not present

## 2013-09-17 DIAGNOSIS — I10 Essential (primary) hypertension: Secondary | ICD-10-CM

## 2013-09-17 DIAGNOSIS — G459 Transient cerebral ischemic attack, unspecified: Secondary | ICD-10-CM

## 2013-09-17 DIAGNOSIS — M25419 Effusion, unspecified shoulder: Secondary | ICD-10-CM | POA: Diagnosis not present

## 2013-09-17 DIAGNOSIS — M25519 Pain in unspecified shoulder: Secondary | ICD-10-CM | POA: Insufficient documentation

## 2013-09-17 DIAGNOSIS — S43499A Other sprain of unspecified shoulder joint, initial encounter: Secondary | ICD-10-CM | POA: Diagnosis not present

## 2013-09-17 DIAGNOSIS — M25512 Pain in left shoulder: Secondary | ICD-10-CM

## 2013-09-17 DIAGNOSIS — G8929 Other chronic pain: Secondary | ICD-10-CM

## 2013-09-17 DIAGNOSIS — X58XXXA Exposure to other specified factors, initial encounter: Secondary | ICD-10-CM | POA: Insufficient documentation

## 2013-09-25 ENCOUNTER — Ambulatory Visit (INDEPENDENT_AMBULATORY_CARE_PROVIDER_SITE_OTHER): Payer: Medicare Other | Admitting: Family Medicine

## 2013-09-25 ENCOUNTER — Encounter: Payer: Self-pay | Admitting: Family Medicine

## 2013-09-25 VITALS — BP 123/63 | HR 70 | Temp 98.6°F | Resp 18 | Ht 65.0 in | Wt 164.0 lb

## 2013-09-25 DIAGNOSIS — R351 Nocturia: Secondary | ICD-10-CM | POA: Diagnosis not present

## 2013-09-25 DIAGNOSIS — E875 Hyperkalemia: Secondary | ICD-10-CM

## 2013-09-25 DIAGNOSIS — R3915 Urgency of urination: Secondary | ICD-10-CM | POA: Diagnosis not present

## 2013-09-25 DIAGNOSIS — S43429A Sprain of unspecified rotator cuff capsule, initial encounter: Secondary | ICD-10-CM

## 2013-09-25 DIAGNOSIS — M19019 Primary osteoarthritis, unspecified shoulder: Secondary | ICD-10-CM | POA: Diagnosis not present

## 2013-09-25 DIAGNOSIS — M19012 Primary osteoarthritis, left shoulder: Secondary | ICD-10-CM

## 2013-09-25 DIAGNOSIS — M75100 Unspecified rotator cuff tear or rupture of unspecified shoulder, not specified as traumatic: Secondary | ICD-10-CM

## 2013-09-25 LAB — POCT URINALYSIS DIPSTICK
Bilirubin, UA: NEGATIVE
Blood, UA: NEGATIVE
Glucose, UA: NEGATIVE
LEUKOCYTES UA: NEGATIVE
Nitrite, UA: NEGATIVE
PH UA: 7
PROTEIN UA: NEGATIVE
Spec Grav, UA: 1.02
Urobilinogen, UA: 0.2

## 2013-09-25 LAB — BASIC METABOLIC PANEL
BUN: 22 mg/dL (ref 6–23)
CO2: 29 mEq/L (ref 19–32)
CREATININE: 1.3 mg/dL (ref 0.4–1.5)
Calcium: 8.8 mg/dL (ref 8.4–10.5)
Chloride: 100 mEq/L (ref 96–112)
GFR: 57.32 mL/min — AB (ref >60.00)
Glucose, Bld: 94 mg/dL (ref 70–99)
Potassium: 4.5 mEq/L (ref 3.5–5.1)
Sodium: 134 mEq/L — ABNORMAL LOW (ref 135–145)

## 2013-09-25 NOTE — Progress Notes (Signed)
OFFICE NOTE  09/25/2013  CC:  Chief Complaint  Patient presents with  . Follow-up     HPI: Patient is a 80 y.o. Caucasian male who is here for 10d f/u. Hyperkalemic on labs last visit: cut valsartan back to 20mg  qd. Chronic left shoulder pain since a fall: MRI was done and showed RC and long head biceps tear.  Complains of at least 9 mo hx of urinary frequency, nocturia, urinary urgency, incontinence at times.  No dysuria or hematuria.  Says he has not had any med tx for this in the past.  No urinary hesitancy or straining or feeling of incomplete emptying.  Pertinent PMH:  Past medical, surgical, social, and family history reviewed and no changes are noted since last office visit.  MEDS: Valsartan dose is 1/2 of 40mg  tab. Outpatient Prescriptions Prior to Visit  Medication Sig Dispense Refill  . azelastine (OPTIVAR) 0.05 % ophthalmic solution       . BETOPTIC-S 0.25 % ophthalmic suspension       . furosemide (LASIX) 20 MG tablet 1 tab po qod for chronic lower extremity swelling  15 tablet  6  . hydrALAZINE (APRESOLINE) 10 MG tablet       . valsartan (DIOVAN) 40 MG tablet       . loperamide (IMODIUM) 2 MG capsule        No facility-administered medications prior to visit.    PE: Blood pressure 123/63, pulse 70, temperature 98.6 F (37 C), temperature source Temporal, resp. rate 18, height 5\' 5"  (1.651 m), weight 164 lb (74.39 kg), SpO2 98.00%. Gen: Alert, well appearing sitting up in wheelchair.  Patient is oriented to person, place, time, and situation. AFFECT: pleasant, lucid thought and speech. No further exam today.  LABS: CC UA today was normal.  IMPRESSION AND PLAN:  1) HTN: stable even though we cut his valsartan dose in half due to recent hyperkalemia. Continue 1/2 of 40mg  valsartan qd+ hydralazine.  2) Hyperkalemia: recheck K today.  3) Overactive bladder: trial of ditropan generic 5mg , 1/2 tab po qhs to see how he responds. Uptitrate slowly if responds  minimally and no adverse side effects.  4) Chronic left shoulder pain; torn tendons, arthritis. Ortho referral made today.  FOLLOW UP: 1 mo discuss memory problems

## 2013-09-25 NOTE — Progress Notes (Signed)
Pre visit review using our clinic review tool, if applicable. No additional management support is needed unless otherwise documented below in the visit note. 

## 2013-10-03 DIAGNOSIS — L738 Other specified follicular disorders: Secondary | ICD-10-CM | POA: Diagnosis not present

## 2013-10-07 DIAGNOSIS — M67919 Unspecified disorder of synovium and tendon, unspecified shoulder: Secondary | ICD-10-CM | POA: Diagnosis not present

## 2013-10-07 DIAGNOSIS — M719 Bursopathy, unspecified: Secondary | ICD-10-CM | POA: Diagnosis not present

## 2013-10-09 DIAGNOSIS — C4491 Basal cell carcinoma of skin, unspecified: Secondary | ICD-10-CM | POA: Diagnosis not present

## 2013-10-09 DIAGNOSIS — C4441 Basal cell carcinoma of skin of scalp and neck: Secondary | ICD-10-CM | POA: Diagnosis not present

## 2013-10-23 DIAGNOSIS — C44319 Basal cell carcinoma of skin of other parts of face: Secondary | ICD-10-CM | POA: Diagnosis not present

## 2013-10-24 ENCOUNTER — Encounter: Payer: Self-pay | Admitting: Family Medicine

## 2013-10-24 ENCOUNTER — Ambulatory Visit (INDEPENDENT_AMBULATORY_CARE_PROVIDER_SITE_OTHER): Payer: Medicare Other | Admitting: Family Medicine

## 2013-10-24 VITALS — BP 114/58 | HR 65 | Temp 97.5°F | Resp 16 | Ht 65.0 in | Wt 161.0 lb

## 2013-10-24 DIAGNOSIS — G8929 Other chronic pain: Secondary | ICD-10-CM

## 2013-10-24 DIAGNOSIS — N3941 Urge incontinence: Secondary | ICD-10-CM | POA: Diagnosis not present

## 2013-10-24 DIAGNOSIS — I10 Essential (primary) hypertension: Secondary | ICD-10-CM

## 2013-10-24 DIAGNOSIS — M25512 Pain in left shoulder: Secondary | ICD-10-CM

## 2013-10-24 DIAGNOSIS — M25519 Pain in unspecified shoulder: Secondary | ICD-10-CM

## 2013-10-24 DIAGNOSIS — Z8673 Personal history of transient ischemic attack (TIA), and cerebral infarction without residual deficits: Secondary | ICD-10-CM | POA: Diagnosis not present

## 2013-10-24 DIAGNOSIS — R413 Other amnesia: Secondary | ICD-10-CM | POA: Diagnosis not present

## 2013-10-24 LAB — LIPID PANEL
CHOL/HDL RATIO: 2
Cholesterol: 141 mg/dL (ref 0–200)
HDL: 60.6 mg/dL (ref >39.00)
LDL Cholesterol: 70 mg/dL (ref 0–99)
Triglycerides: 51 mg/dL (ref 0.0–149.0)
VLDL: 10.2 mg/dL (ref 0.0–40.0)

## 2013-10-24 NOTE — Assessment & Plan Note (Signed)
Likely beginnings of vascular dementia, as it clearly had it's onset after a small CVA he had in 2013. It is bothersome for him, but in his present living situation he is functioning fine. No meds recommended, nor did I recommend any further w/u at this time. We discussed the need to maximize secondary prevention strategies: continue ASA 81mg  qd, continue antihypertensives, check chol panel today (not fasting) to see if addition of statin would be appropriate.

## 2013-10-24 NOTE — Progress Notes (Signed)
OFFICE VISIT  10/24/2013   CC:  Chief Complaint  Patient presents with  . Follow-up   HPI:    Patient is a 80 y.o. Caucasian male who presents with his son for evaluation of memory problems. Has demonstrated deficits in short term memory ever since TIA in 2013.  He first noted problems recalling people's names after he first meets them.  Long term memory intact.  He no longer does his finances or balances check book.  He needs assistance with bathing and dressing only b/c of balance/strength issues.  He feels like he could maybe make his own food if it was a simple meal.  Both pt and son deny any pattern of gradual decline in memory and mental functioning over the years prior to his TIA/CVA 2013.  No onset of the memory problems after starting any particular medication. Denies depressed mood.   No hallucinations or delusions.  No belligerent/irritable behavior, no anger problems.  He is frustrated b/c he still has problems with urinary urgency and a recent trial of ditropan 5mg , 1/2 tab qhs, did not help any.  I did have the benefit of reviewing pt's records from prior PCP in [place] today.  There is no mention of memory impairment/cognitive impairment in any notes.  There are no labs or imaging tests available. Additional dx of peripheral neuropathy noted in these records (idiopathic), plus "unspecified cerebrovascular dz" is listed in dx and a pontine CVA is mentioned from 09/2011. Also noted is a dx of "suspected NSTEMI with normal EKG, mildly elevated troponin, and normal echocardiogram, pt declined cardiology consult".   Past Medical History  Diagnosis Date  . Osteoarthritis     hands/wrists  . Depression     antidepressant x 1 yr--situational. Resolved 08/2013 per pt/son.  . Glaucoma   . Hypertension   . TIA (transient ischemic attack)   . Urine incontinence   . Frequent headaches   . Chronic venous insufficiency     compression hose    Past Surgical History  Procedure  Laterality Date  . Cholecystectomy    . Tonsilectomy, adenoidectomy, bilateral myringotomy and tubes     History   Social History Narrative   Widower.   Relocated to [place] from [place] 02/2013 to live in [place] (falls while living alone led to this).   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   Pipe smoker until 2014.   Alcohol: social drinker until his 76s, then no alcohol.           Outpatient Prescriptions Prior to Visit  Medication Sig Dispense Refill  . azelastine (OPTIVAR) 0.05 % ophthalmic solution       . BETOPTIC-S 0.25 % ophthalmic suspension       . furosemide (LASIX) 20 MG tablet 1 tab po qod for chronic lower extremity swelling  15 tablet  6  . hydrALAZINE (APRESOLINE) 10 MG tablet       . loperamide (IMODIUM) 2 MG capsule       . valsartan (DIOVAN) 40 MG tablet        No facility-administered medications prior to visit.    Allergies  Allergen Reactions  . Altace [Ramipril] Other (See Comments)    As per old records from [place]  . Amoxicillin Other (See Comments)    Per old records from [place]  . Cozaar [Losartan Potassium] Other (See Comments)    As per old records from [place]    ROS As per HPI  PE: Blood pressure 114/58, pulse  65, temperature 97.5 F (36.4 C), temperature source Oral, resp. rate 16, height 5\' 5"  (1.651 m), weight 161 lb (73.029 kg), SpO2 99.00%. Gen: Alert, well appearing.  Patient is oriented to person, place, time, and situation. AFFECT: pleasant, lucid thought and speech. CV: RRR, no m/r/g LUNGS: insp crackles in both bases, good aeration, nonlabored resps EXT: 2+ pitting edema from knees down bilat NEURO: he can get up from his chair with slight assistance with pushing off with arms/hands. He steps fairly quickly but with short steps to the exam table and gets onto exam table w/out assistance.   No cogwheeling/rigidity.  No mask-like facies.  CN 2-12 intact bilaterally, strength 5/5 in proximal and distal  upper extremities and lower extremities bilaterally.   No tremor.   Upper extremity and lower extremity DTRs symmetric.  MMSE: 27/30 (-2 for recall and -1 for concentration)  LABS:  None today  Recent labs:  Lab Results  Component Value Date   WBC 8.0 09/04/2013   HGB 11.2* 09/04/2013   HCT 33.1* 09/04/2013   MCV 92.3 09/04/2013   PLT 224.0 09/04/2013     Chemistry      Component Value Date/Time   NA 134* 09/25/2013 1540   K 4.5 09/25/2013 1540   CL 100 09/25/2013 1540   CO2 29 09/25/2013 1540   BUN 22 09/25/2013 1540   CREATININE 1.3 09/25/2013 1540      Component Value Date/Time   CALCIUM 8.8 09/25/2013 1540      IMPRESSION AND PLAN:  Short-term memory loss Likely beginnings of vascular dementia, as it clearly had it's onset after a small CVA he had in 2013. It is bothersome for him, but in his present living situation he is functioning fine. No meds recommended, nor did I recommend any further w/u at this time. We discussed the need to maximize secondary prevention strategies: continue ASA 81mg  qd, continue antihypertensives, check chol panel today (not fasting) to see if addition of statin would be appropriate.  Urge incontinence Nocturia is the worst part of this, complicated by his need for lasix qod as well (due to LE edema). Will try to slowly titrate up on the ditropan--increase to a whole 5mg  tabs qhs today--so we can hopefully avoid potential worsening of cognitive function that is sometimes assoc with this type of med in elderly patients.  Chronic left shoulder pain Not evaluated today, but per pt's update the plan is to return to ortho for f/u visit to discuss the next step since the steroid injection into shoulder did not help any.  Spent 30 min with pt today, with >50% of this time spent in counseling and care coordination regarding the above problems.  FOLLOW UP: Return in about 4 weeks (around 11/21/2013) for f/u nocturia.

## 2013-10-24 NOTE — Assessment & Plan Note (Addendum)
Nocturia is the worst part of this, complicated by his need for lasix qod as well (due to LE edema). Will try to slowly titrate up on the ditropan--increase to a whole 5mg  tabs qhs today--so we can hopefully avoid potential worsening of cognitive function that is sometimes assoc with this type of med in elderly patients.

## 2013-10-24 NOTE — Assessment & Plan Note (Signed)
Not evaluated today, but per pt's update the plan is to return to ortho for f/u visit to discuss the next step since the steroid injection into shoulder did not help any.

## 2013-10-24 NOTE — Progress Notes (Signed)
Pre visit review using our clinic review tool, if applicable. No additional management support is needed unless otherwise documented below in the visit note. 

## 2013-11-04 ENCOUNTER — Telehealth: Payer: Self-pay | Admitting: Family Medicine

## 2013-11-04 MED ORDER — OXYBUTYNIN CHLORIDE 5 MG PO TABS: 5.0000 mg | ORAL_TABLET | Freq: Every day | ORAL | Status: DC

## 2013-11-04 NOTE — Telephone Encounter (Signed)
Yes, may do this rx, 30 d supply, RF x 6.

## 2013-11-04 NOTE — Telephone Encounter (Signed)
Rx refill request came for oxybutin for patient but I don't see that we've ever actually sent this RX into pharmacy.  Can you just verify that I can send Rx in for oxybutin 5mg  tabs QHS.

## 2013-11-19 DIAGNOSIS — M67919 Unspecified disorder of synovium and tendon, unspecified shoulder: Secondary | ICD-10-CM | POA: Diagnosis not present

## 2013-11-19 DIAGNOSIS — M719 Bursopathy, unspecified: Secondary | ICD-10-CM | POA: Diagnosis not present

## 2013-11-21 ENCOUNTER — Encounter: Payer: Self-pay | Admitting: Family Medicine

## 2013-11-21 ENCOUNTER — Ambulatory Visit (INDEPENDENT_AMBULATORY_CARE_PROVIDER_SITE_OTHER): Payer: Medicare Other | Admitting: Family Medicine

## 2013-11-21 VITALS — BP 138/72 | HR 73 | Temp 98.2°F | Ht 65.0 in | Wt 168.8 lb

## 2013-11-21 DIAGNOSIS — Z8673 Personal history of transient ischemic attack (TIA), and cerebral infarction without residual deficits: Secondary | ICD-10-CM | POA: Diagnosis not present

## 2013-11-21 DIAGNOSIS — N401 Enlarged prostate with lower urinary tract symptoms: Secondary | ICD-10-CM

## 2013-11-21 DIAGNOSIS — I872 Venous insufficiency (chronic) (peripheral): Secondary | ICD-10-CM | POA: Diagnosis not present

## 2013-11-21 DIAGNOSIS — G8194 Hemiplegia, unspecified affecting left nondominant side: Secondary | ICD-10-CM | POA: Insufficient documentation

## 2013-11-21 DIAGNOSIS — I10 Essential (primary) hypertension: Secondary | ICD-10-CM

## 2013-11-21 DIAGNOSIS — R351 Nocturia: Secondary | ICD-10-CM

## 2013-11-21 MED ORDER — TAMSULOSIN HCL 0.4 MG PO CAPS: 0.4000 mg | ORAL_CAPSULE | Freq: Every day | ORAL | Status: DC

## 2013-11-21 NOTE — Progress Notes (Signed)
Pre visit review using our clinic review tool, if applicable. No additional management support is needed unless otherwise documented below in the visit note. 

## 2013-11-21 NOTE — Progress Notes (Signed)
OFFICE NOTE  11/21/2013  CC:  Chief Complaint  Patient presents with  . Follow-up    4 weeks   HPI: Patient is a 80 y.o. Caucasian male who is here for 1 mo f/u urge incontinence.   Reviewed sx's again: usually urinates about 6 times during daytime hours, but starting at 3 AM he is up every hour. No problem with hesitancy.  Occ starts and stops.  Has lots of probs getting back to sleep due to this nocturia.  Started atorv since last visit and says no side effects. Compliant with HTN meds, no bps from his ALF to report. Wears compression hose regularly but sounds like he is not watching the sodium in his diet at all except for trying to not add salt to food.  Minimal to no elevation of legs above level of heart.  Had another cortisone shot in left shoulder at ortho recently.  Pertinent PMH:  Past medical, surgical, social, and family history reviewed and no changes are noted since last office visit.  MEDS:  Atorvastatin 20mg  qd Outpatient Prescriptions Prior to Visit  Medication Sig Dispense Refill  . azelastine (OPTIVAR) 0.05 % ophthalmic solution       . BETOPTIC-S 0.25 % ophthalmic suspension       . furosemide (LASIX) 20 MG tablet 1 tab po qod for chronic lower extremity swelling  15 tablet  6  . hydrALAZINE (APRESOLINE) 10 MG tablet       . loperamide (IMODIUM) 2 MG capsule       . oxybutynin (DITROPAN) 5 MG tablet Take 1 tablet (5 mg total) by mouth at bedtime.  30 tablet  6  . valsartan (DIOVAN) 40 MG tablet        No facility-administered medications prior to visit.    PE: Blood pressure 138/72, pulse 73, temperature 98.2 F (36.8 C), temperature source Temporal, height 5\' 5"  (1.651 m), weight 168 lb 12 oz (76.544 kg), SpO2 95.00%. Gen: Alert, well appearing.  Patient is oriented to person, place, time, and situation. LEGS: 3+ pitting edema in both LL's, compression hose are currently on.  IMPRESSION AND PLAN:  BPH associated with nocturia Will stop  anticholinergic treatment for tx of urge incontinence, as I think he is describing sx's more c/w BPH with nocturia. Trial of flomax 0.4mg  qhs.  Therapeutic expectations and side effect profile of medication discussed today.  Patient's questions answered.   Venous insufficiency of both lower extremities Fairly stable Emphasized low Na diet--he'll do his best (moving to new ALF soon). Cont lasix 20mg  qod.  Continue compression stockings.  HTN (hypertension), benign The current medical regimen is effective;  continue present plan and medications.   History of CVA (cerebrovascular accident) + hx of NSTEMI as per old records. This made me place him on statin (lipitor 20mg ) since last visit and he's tolerating this well. Next f/u visit will increase to 40mg  atorv qd.   An After Visit Summary was printed and given to the patient.  FOLLOW UP: 4 wks

## 2013-11-24 DIAGNOSIS — N183 Chronic kidney disease, stage 3 unspecified: Secondary | ICD-10-CM | POA: Diagnosis not present

## 2013-11-24 DIAGNOSIS — I872 Venous insufficiency (chronic) (peripheral): Secondary | ICD-10-CM | POA: Diagnosis not present

## 2013-11-24 DIAGNOSIS — I129 Hypertensive chronic kidney disease with stage 1 through stage 4 chronic kidney disease, or unspecified chronic kidney disease: Secondary | ICD-10-CM | POA: Diagnosis not present

## 2013-11-24 DIAGNOSIS — M545 Low back pain, unspecified: Secondary | ICD-10-CM | POA: Diagnosis not present

## 2013-11-24 DIAGNOSIS — I509 Heart failure, unspecified: Secondary | ICD-10-CM | POA: Diagnosis not present

## 2013-11-24 NOTE — Assessment & Plan Note (Signed)
The current medical regimen is effective;  continue present plan and medications.  

## 2013-11-24 NOTE — Assessment & Plan Note (Signed)
Will stop anticholinergic treatment for tx of urge incontinence, as I think he is describing sx's more c/w BPH with nocturia. Trial of flomax 0.4mg  qhs.  Therapeutic expectations and side effect profile of medication discussed today.  Patient's questions answered.

## 2013-11-24 NOTE — Assessment & Plan Note (Addendum)
+   hx of NSTEMI as per old records. This made me place him on statin (lipitor 20mg ) since last visit and he's tolerating this well. Next f/u visit will increase to 40mg  atorv qd.

## 2013-11-24 NOTE — Assessment & Plan Note (Addendum)
Fairly stable Emphasized low Na diet--he'll do his best (moving to new ALF soon). Cont lasix 20mg  qod.  Continue compression stockings.

## 2013-12-17 ENCOUNTER — Other Ambulatory Visit: Payer: Self-pay

## 2013-12-17 ENCOUNTER — Emergency Department (HOSPITAL_COMMUNITY): Payer: Medicare Other

## 2013-12-17 ENCOUNTER — Inpatient Hospital Stay (HOSPITAL_COMMUNITY)
Admission: EM | Admit: 2013-12-17 | Discharge: 2013-12-20 | DRG: 293 | Disposition: A | Discharge status: Home / Self Care | Payer: Medicare Other | Attending: Internal Medicine | Admitting: Internal Medicine

## 2013-12-17 ENCOUNTER — Encounter (HOSPITAL_COMMUNITY): Payer: Self-pay | Admitting: Radiology

## 2013-12-17 DIAGNOSIS — Z66 Do not resuscitate: Secondary | ICD-10-CM | POA: Diagnosis present

## 2013-12-17 DIAGNOSIS — Z85828 Personal history of other malignant neoplasm of skin: Secondary | ICD-10-CM

## 2013-12-17 DIAGNOSIS — N400 Enlarged prostate without lower urinary tract symptoms: Secondary | ICD-10-CM | POA: Diagnosis present

## 2013-12-17 DIAGNOSIS — I8000 Phlebitis and thrombophlebitis of superficial vessels of unspecified lower extremity: Secondary | ICD-10-CM | POA: Diagnosis not present

## 2013-12-17 DIAGNOSIS — Z791 Long term (current) use of non-steroidal anti-inflammatories (NSAID): Secondary | ICD-10-CM | POA: Diagnosis not present

## 2013-12-17 DIAGNOSIS — I739 Peripheral vascular disease, unspecified: Secondary | ICD-10-CM | POA: Diagnosis present

## 2013-12-17 DIAGNOSIS — Z881 Allergy status to other antibiotic agents status: Secondary | ICD-10-CM

## 2013-12-17 DIAGNOSIS — I10 Essential (primary) hypertension: Secondary | ICD-10-CM

## 2013-12-17 DIAGNOSIS — M19039 Primary osteoarthritis, unspecified wrist: Secondary | ICD-10-CM | POA: Diagnosis present

## 2013-12-17 DIAGNOSIS — I252 Old myocardial infarction: Secondary | ICD-10-CM

## 2013-12-17 DIAGNOSIS — M545 Low back pain, unspecified: Secondary | ICD-10-CM

## 2013-12-17 DIAGNOSIS — G8194 Hemiplegia, unspecified affecting left nondominant side: Secondary | ICD-10-CM

## 2013-12-17 DIAGNOSIS — M19049 Primary osteoarthritis, unspecified hand: Secondary | ICD-10-CM | POA: Diagnosis not present

## 2013-12-17 DIAGNOSIS — N183 Chronic kidney disease, stage 3 unspecified: Secondary | ICD-10-CM | POA: Diagnosis present

## 2013-12-17 DIAGNOSIS — Z8673 Personal history of transient ischemic attack (TIA), and cerebral infarction without residual deficits: Secondary | ICD-10-CM

## 2013-12-17 DIAGNOSIS — H409 Unspecified glaucoma: Secondary | ICD-10-CM | POA: Diagnosis present

## 2013-12-17 DIAGNOSIS — R0602 Shortness of breath: Secondary | ICD-10-CM

## 2013-12-17 DIAGNOSIS — I129 Hypertensive chronic kidney disease with stage 1 through stage 4 chronic kidney disease, or unspecified chronic kidney disease: Secondary | ICD-10-CM | POA: Diagnosis present

## 2013-12-17 DIAGNOSIS — R0902 Hypoxemia: Secondary | ICD-10-CM | POA: Diagnosis not present

## 2013-12-17 DIAGNOSIS — G609 Hereditary and idiopathic neuropathy, unspecified: Secondary | ICD-10-CM | POA: Diagnosis present

## 2013-12-17 DIAGNOSIS — N401 Enlarged prostate with lower urinary tract symptoms: Secondary | ICD-10-CM | POA: Diagnosis present

## 2013-12-17 DIAGNOSIS — R351 Nocturia: Secondary | ICD-10-CM | POA: Diagnosis present

## 2013-12-17 DIAGNOSIS — Z87891 Personal history of nicotine dependence: Secondary | ICD-10-CM | POA: Diagnosis not present

## 2013-12-17 DIAGNOSIS — I809 Phlebitis and thrombophlebitis of unspecified site: Secondary | ICD-10-CM

## 2013-12-17 DIAGNOSIS — Z888 Allergy status to other drugs, medicaments and biological substances status: Secondary | ICD-10-CM | POA: Diagnosis not present

## 2013-12-17 DIAGNOSIS — Z7982 Long term (current) use of aspirin: Secondary | ICD-10-CM | POA: Diagnosis not present

## 2013-12-17 DIAGNOSIS — M79609 Pain in unspecified limb: Secondary | ICD-10-CM | POA: Diagnosis not present

## 2013-12-17 DIAGNOSIS — I872 Venous insufficiency (chronic) (peripheral): Secondary | ICD-10-CM

## 2013-12-17 DIAGNOSIS — I509 Heart failure, unspecified: Secondary | ICD-10-CM | POA: Diagnosis present

## 2013-12-17 DIAGNOSIS — I5033 Acute on chronic diastolic (congestive) heart failure: Secondary | ICD-10-CM | POA: Diagnosis present

## 2013-12-17 DIAGNOSIS — M7989 Other specified soft tissue disorders: Secondary | ICD-10-CM | POA: Diagnosis not present

## 2013-12-17 DIAGNOSIS — R238 Other skin changes: Secondary | ICD-10-CM | POA: Diagnosis present

## 2013-12-17 LAB — PRO B NATRIURETIC PEPTIDE: PRO B NATRI PEPTIDE: 338.9 pg/mL (ref 0–450)

## 2013-12-17 LAB — CBC
HCT: 31.3 % — ABNORMAL LOW (ref 39.0–52.0)
Hemoglobin: 10.8 g/dL — ABNORMAL LOW (ref 13.0–17.0)
MCH: 31.7 pg (ref 26.0–34.0)
MCHC: 34.5 g/dL (ref 30.0–36.0)
MCV: 91.8 fL (ref 78.0–100.0)
PLATELETS: 268 10*3/uL (ref 150–400)
RBC: 3.41 MIL/uL — ABNORMAL LOW (ref 4.22–5.81)
RDW: 13.5 % (ref 11.5–15.5)
WBC: 7.7 10*3/uL (ref 4.0–10.5)

## 2013-12-17 LAB — BASIC METABOLIC PANEL
ANION GAP: 14 (ref 5–15)
BUN: 21 mg/dL (ref 6–23)
CHLORIDE: 98 meq/L (ref 96–112)
CO2: 24 meq/L (ref 19–32)
Calcium: 9.1 mg/dL (ref 8.4–10.5)
Creatinine, Ser: 1.24 mg/dL (ref 0.50–1.35)
GFR calc non Af Amer: 50 mL/min — ABNORMAL LOW (ref >90)
GFR, EST AFRICAN AMERICAN: 58 mL/min — AB (ref >90)
Glucose, Bld: 110 mg/dL — ABNORMAL HIGH (ref 70–99)
POTASSIUM: 5.2 meq/L (ref 3.7–5.3)
SODIUM: 136 meq/L — AB (ref 137–147)

## 2013-12-17 LAB — I-STAT TROPONIN, ED
TROPONIN I, POC: 0 ng/mL (ref 0.00–0.08)
Troponin i, poc: 0.01 ng/mL (ref 0.00–0.08)

## 2013-12-17 LAB — TROPONIN I

## 2013-12-17 MED ORDER — ACETAMINOPHEN 325 MG PO TABS
325.0000 mg | ORAL_TABLET | Freq: Four times a day (QID) | ORAL | Status: DC | PRN
  Administered 2013-12-18: 325 mg via ORAL
  Filled 2013-12-17: qty 1

## 2013-12-17 MED ORDER — TAMSULOSIN HCL 0.4 MG PO CAPS
0.4000 mg | ORAL_CAPSULE | Freq: Every day | ORAL | Status: DC
  Administered 2013-12-17 – 2013-12-19 (×3): 0.4 mg via ORAL
  Filled 2013-12-17 (×4): qty 1

## 2013-12-17 MED ORDER — ONDANSETRON HCL 4 MG PO TABS: 4.0000 mg | ORAL_TABLET | Freq: Four times a day (QID) | ORAL | Status: DC | PRN

## 2013-12-17 MED ORDER — ONDANSETRON HCL 4 MG/2ML IJ SOLN: 4.0000 mg | Freq: Four times a day (QID) | INTRAMUSCULAR | Status: DC | PRN

## 2013-12-17 MED ORDER — FUROSEMIDE 10 MG/ML IJ SOLN
20.0000 mg | Freq: Once | INTRAMUSCULAR | Status: AC
  Administered 2013-12-17: 20 mg via INTRAVENOUS
  Filled 2013-12-17: qty 2

## 2013-12-17 MED ORDER — IOHEXOL 350 MG/ML SOLN
100.0000 mL | Freq: Once | INTRAVENOUS | Status: AC | PRN
  Administered 2013-12-17: 100 mL via INTRAVENOUS

## 2013-12-17 MED ORDER — BETAXOLOL HCL 0.25 % OP SUSP
1.0000 [drp] | Freq: Every day | OPHTHALMIC | Status: DC
  Administered 2013-12-17 – 2013-12-20 (×4): 1 [drp] via OPHTHALMIC
  Filled 2013-12-17: qty 10

## 2013-12-17 MED ORDER — HEPARIN SODIUM (PORCINE) 5000 UNIT/ML IJ SOLN
5000.0000 [IU] | Freq: Three times a day (TID) | INTRAMUSCULAR | Status: DC
  Administered 2013-12-17 – 2013-12-20 (×7): 5000 [IU] via SUBCUTANEOUS
  Filled 2013-12-17 (×11): qty 1

## 2013-12-17 MED ORDER — MECLIZINE HCL 12.5 MG PO TABS
12.5000 mg | ORAL_TABLET | Freq: Two times a day (BID) | ORAL | Status: DC | PRN
  Filled 2013-12-17: qty 1

## 2013-12-17 MED ORDER — ACETAMINOPHEN 325 MG PO TABS: 650.0000 mg | ORAL_TABLET | Freq: Four times a day (QID) | ORAL | Status: DC | PRN

## 2013-12-17 MED ORDER — ASPIRIN 325 MG PO TABS
325.0000 mg | ORAL_TABLET | ORAL | Status: DC
  Filled 2013-12-17: qty 1

## 2013-12-17 MED ORDER — ATORVASTATIN CALCIUM 20 MG PO TABS
20.0000 mg | ORAL_TABLET | Freq: Every evening | ORAL | Status: DC
  Administered 2013-12-17 – 2013-12-19 (×3): 20 mg via ORAL
  Filled 2013-12-17 (×4): qty 1

## 2013-12-17 MED ORDER — FUROSEMIDE 40 MG PO TABS
40.0000 mg | ORAL_TABLET | Freq: Every day | ORAL | Status: DC
  Administered 2013-12-18: 40 mg via ORAL
  Filled 2013-12-17: qty 1

## 2013-12-17 MED ORDER — MELOXICAM 7.5 MG PO TABS
7.5000 mg | ORAL_TABLET | Freq: Every day | ORAL | Status: DC
  Administered 2013-12-17 – 2013-12-18 (×2): 7.5 mg via ORAL
  Filled 2013-12-17 (×2): qty 1

## 2013-12-17 MED ORDER — ALBUTEROL SULFATE (2.5 MG/3ML) 0.083% IN NEBU: 2.5000 mg | INHALATION_SOLUTION | Freq: Four times a day (QID) | RESPIRATORY_TRACT | Status: DC | PRN

## 2013-12-17 MED ORDER — ASPIRIN 81 MG PO CHEW
81.0000 mg | CHEWABLE_TABLET | Freq: Every day | ORAL | Status: DC
  Administered 2013-12-17 – 2013-12-20 (×4): 81 mg via ORAL
  Filled 2013-12-17 (×4): qty 1

## 2013-12-17 MED ORDER — TRAMADOL HCL 50 MG PO TABS: 25.0000 mg | ORAL_TABLET | Freq: Two times a day (BID) | ORAL | Status: DC | PRN

## 2013-12-17 MED ORDER — ACETAMINOPHEN 650 MG RE SUPP: 650.0000 mg | Freq: Four times a day (QID) | RECTAL | Status: DC | PRN

## 2013-12-17 NOTE — ED Provider Notes (Signed)
  Face-to-face evaluation   History: He complains of leg swelling for an unknown period of time, that has worsened. Today, he developed shortness of breath  Physical exam: Elderly man in mild distress. Heart regular rate and rhythm without murmur. Lungs clear anteriorly and posteriorly. Legs have 3-4+ peripheral edema below the knees, with a vague, redness, bilaterally  Medical screening examination/treatment/procedure(s) were conducted as a shared visit with non-physician practitioner(s) and myself.  I personally evaluated the patient during the encounter  Richarda Blade, MD 12/18/13 [phone]

## 2013-12-17 NOTE — ED Notes (Addendum)
Patient re-ambulated with pulse oximetry attached per MD request. Patient ambulated approximately 58ft with minimal assistance utilizing walker. Pulse ox levels noted to drop to 86% at times. Patient stated no distress, but displayed increased work of breathing. HR remained steady throughout exertion. PA informed of assessment.

## 2013-12-17 NOTE — ED Notes (Signed)
Patient returned from CT

## 2013-12-17 NOTE — ED Notes (Signed)
IV team at bedside 

## 2013-12-17 NOTE — ED Notes (Signed)
TO ED via [facility] from [facility]- with c/o shortness of breath started yesterday with exertion. Today has been more short of breath. On arrival has 2-3 + pitting edema from toes to thighs/ alert/oriented to place and person/

## 2013-12-17 NOTE — ED Notes (Addendum)
Pt O2 while ambulating started at 96% then dropped to 86%.

## 2013-12-17 NOTE — ED Notes (Signed)
Patient returned from CT, IV that was started for scan infiltrated before CT tech could start contrast.  IV team paged

## 2013-12-17 NOTE — Progress Notes (Signed)
VASCULAR LAB PRELIMINARY  PRELIMINARY  PRELIMINARY  PRELIMINARY  Bilateral lower extremity venous duplex completed.    Preliminary report: Bilateral:  No evidence of DVT, superficial thrombosis, or Baker's Cyst.   Johnay Mano, RVS 12/17/2013, 1:33 PM

## 2013-12-17 NOTE — ED Notes (Signed)
Attempted report 

## 2013-12-17 NOTE — ED Provider Notes (Signed)
CSN: [id number]     Arrival date & time 12/17/13  1051 History   First MD Initiated Contact with Patient 12/17/13 1110     Chief Complaint  Patient presents with  . Shortness of Breath    (Consider location/radiation/quality/duration/timing/severity/associated sxs/prior Treatment) HPI Comments: Patient is an 80 year old male with a history of hypertension, CVA, NSTEMI, and stage III chronic renal insufficiency who presents to the emergency department for shortness of breath. Patient presents from [facility] He states that SOB began yesterday and was associated with exertion. Today SOB was also present at rest, and worsened while speaking with the care provider at [facility] about his leg swelling. Patient states that symptoms have been preceded by lower extremity b/l pitting edema x 1.5 months. He has been sleeping on 2 pillows in the evening and keeping his legs elevated with 2 pillows at night as well. He denies the use of home O2. No associated fever, syncope, lightheadedness, cough, N/V, abdominal pain, diarrhea, melena, hematochezia, urinary symptoms, numbness/tingling, or weakness.  PCP - Dr. Anitra Lauth of [place]. No cardiologist, per patient.  Patient is a 80 y.o. male presenting with shortness of breath. The history is provided by the patient. No language interpreter was used.  Shortness of Breath Associated symptoms: chest pain (for "a second" when SOB worsened; has since resolved)   Associated symptoms: no cough, no fever and no vomiting     Past Medical History  Diagnosis Date  . Osteoarthritis     hands/wrists  . Depression     antidepressant x 1 yr--situational. Resolved 08/2013 per pt/son.  . Glaucoma   . Hypertension   . 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  No significant residual deficit except short term memory impairment  . Urine incontinence   . Frequent headaches   . Chronic venous insufficiency     compression hose  . Peripheral neuropathy  2013    Dx'd by neuro in [place] at the time the patient was hospitalized briefly for pontine CVA.  . NSTEMI (non-ST elevated myocardial infarction) 06/2011    [place]: Troponin mildly elevated, normal EKG, normal ECHO, pt declined cardiology consult--per old PCP records.  . Basal cell carcinoma 2011    Face  . Chronic renal insufficiency, stage III (moderate) 2013    CrCl in the 50s   Past Surgical History  Procedure Laterality Date  . Cholecystectomy    . Tonsilectomy, adenoidectomy, bilateral myringotomy and tubes     Family History  Problem Relation Age of Onset  . Cancer Mother   . Heart disease Mother    History  Substance Use Topics  . Smoking status: Former Research scientist (life sciences)  . Smokeless tobacco: Never Used  . Alcohol Use: No    Review of Systems  Constitutional: Negative for fever.  Respiratory: Positive for shortness of breath. Negative for cough.   Cardiovascular: Positive for chest pain (for "a second" when SOB worsened; has since resolved) and leg swelling.  Gastrointestinal: Negative for nausea and vomiting.  Neurological: Negative for syncope, weakness and numbness.  All other systems reviewed and are negative.    Allergies  Altace; Amoxicillin; and Cozaar  Home Medications   Prior to Admission medications   Medication Sig Start Date End Date Taking? Authorizing Provider  acetaminophen (TYLENOL) 325 MG tablet Take 325 mg by mouth every 6 (six) hours as needed for mild pain.   Yes Historical Provider, MD  aspirin 81 MG chewable tablet Chew 81 mg by mouth daily.   Yes  Historical Provider, MD  atorvastatin (LIPITOR) 20 MG tablet Take 20 mg by mouth every evening.  10/28/13  Yes Historical Provider, MD  azelastine (OPTIVAR) 0.05 % ophthalmic solution Place 1 drop into the right eye 2 (two) times daily.  07/22/13  Yes Historical Provider, MD  BETOPTIC-S 0.25 % ophthalmic suspension Place 1 drop into the right eye daily.  07/22/13  Yes Historical Provider, MD  furosemide  (LASIX) 20 MG tablet Take 20 mg by mouth every other day.   Yes Historical Provider, MD  hydrALAZINE (APRESOLINE) 10 MG tablet Take 10 mg by mouth 3 (three) times daily.  08/12/13  Yes Historical Provider, MD  meclizine (ANTIVERT) 25 MG tablet Take 12.5 mg by mouth 2 (two) times daily as needed for dizziness.   Yes Historical Provider, MD  meloxicam (MOBIC) 7.5 MG tablet Take 7.5 mg by mouth daily.   Yes Bryson L Stilwell, PA-C  naproxen sodium (ANAPROX) 220 MG tablet Take 220 mg by mouth every 12 (twelve) hours as needed (for pain).   Yes Historical Provider, MD  tamsulosin (FLOMAX) 0.4 MG CAPS capsule Take 0.4 mg by mouth at bedtime.   Yes Historical Provider, MD  traMADol (ULTRAM) 50 MG tablet Take 25 mg by mouth every 12 (twelve) hours as needed for moderate pain.   Yes Historical Provider, MD  valsartan (DIOVAN) 40 MG tablet Take 20 mg by mouth daily.  08/16/13  Yes Historical Provider, MD  loperamide (IMODIUM) 2 MG capsule  07/28/13   Historical Provider, MD   BP 122/50  Pulse 86  Temp(Src) 97.5 F (36.4 C) (Oral)  Resp 16  Wt 165 lb (74.844 kg)  SpO2 97%  Physical Exam  Nursing note and vitals reviewed. Constitutional: He is oriented to person, place, and time. He appears well-developed and well-nourished. No distress.  Nontoxic/nonseptic appearing  HENT:  Head: Normocephalic and atraumatic.  Mouth/Throat: Oropharynx is clear and moist. No oropharyngeal exudate.  Eyes: Conjunctivae and EOM are normal. Pupils are equal, round, and reactive to light. No scleral icterus.  Neck: Normal range of motion. Neck supple.  Cardiovascular: Normal rate, regular rhythm and normal heart sounds.   Pulmonary/Chest: Effort normal. No respiratory distress. He has no wheezes. He has no rales.  No tachypnea or dyspnea.  Abdominal: Soft. He exhibits no distension. There is no tenderness. There is no rebound and no guarding.  Soft, nontender  Musculoskeletal: Normal range of motion. He exhibits edema.   3+ pitting edema b/l lower extremities. B/l lower extremities warm to touch with blanching erythema.  Neurological: He is alert and oriented to person, place, and time. He exhibits normal muscle tone. Coordination normal.  GCS 15. Patient speaks in full goal oriented sentences. He moves extremities without ataxia.  Skin: Skin is warm and dry. No rash noted. He is not diaphoretic. No erythema. No pallor.  Psychiatric: He has a normal mood and affect. His behavior is normal.    ED Course  Procedures (including critical care time) Labs Review Labs Reviewed  CBC - Abnormal; Notable for the following:    RBC 3.41 (*)    Hemoglobin 10.8 (*)    HCT 31.3 (*)    All other components within normal limits  BASIC METABOLIC PANEL - Abnormal; Notable for the following:    Sodium 136 (*)    Glucose, Bld 110 (*)    GFR calc non Af Amer 50 (*)    GFR calc Af Amer 58 (*)    All other components within normal  limits  PRO B NATRIURETIC PEPTIDE  I-STAT TROPOININ, ED   Imaging Review Dg Chest Port 1 View  12/17/2013   CLINICAL DATA:  Shortness of breath.  EXAM: PORTABLE CHEST - 1 VIEW  COMPARISON:  None.  FINDINGS: The heart size and mediastinal contours are within normal limits. Both lungs are clear. The visualized skeletal structures are unremarkable.  IMPRESSION: No active disease.   Electronically Signed   By: Earle Gell M.D.   On: 12/17/2013 11:34   VASCULAR LAB  PRELIMINARY PRELIMINARY PRELIMINARY PRELIMINARY  Bilateral lower extremity venous duplex completed.  Preliminary report: Bilateral: No evidence of DVT, superficial thrombosis, or Baker's Cyst.  SLAUGHTER, [place], RVS  12/17/2013, 1:33 PM   EKG Interpretation   Date/Time:  Wednesday December 17 2013 11:48:56 EDT Ventricular Rate:  75 PR Interval:  146 QRS Duration: 91 QT Interval:  380 QTC Calculation: 424 R Axis:   -30 Text Interpretation:  Sinus rhythm Left axis deviation Low voltage,  precordial leads Abnormal R-wave  progression, early transition Borderline  repolarization abnormality since last tracing no significant change  Confirmed by Eulis Foster  MD, ELLIOTT ([id number]) on 12/17/2013 11:56:44 AM      MDM   Final diagnoses:  Shortness of breath    80 year old male who presents for SOB and leg swelling. Patient with a history of hypertension, chronic venous insufficiency, and NSTEMI. Physical exam significant for pitting edema in b/l lower extremities with redness and warmth. No other significant findings. No JVD. No CP or SOB. Patient satting 100% on room air over ED course.  Initial work up reassuring; no evidence of STEMI or acute ischemia. No pneumonia, pleural effusion, pneumothorax, or pulmonary edema on chest x-ray. Venous duplex negative for DVT or superficial thrombosis in bilateral extremities. Given sudden onset worsening of shortness of breath, CT angiogram pending. Delta troponin also ordered.   Patient currently on Lasix 20mg  QOD; if rest of work up negative, believe patient is stable for d/c home to [facility] with PCP follow up with change in Lasix dosing to 20mg  QD in addition to 10 day course of 100mg  doxycyline for presumed superficial thrombophlebitis of b/l lower extremities. Patient signed out to Margarita Mail, PA-C who will follow up on pending labs and imaging and disposition appropriately.   Filed Vitals:   12/17/13 1449 12/17/13 1500 12/17/13 1515 12/17/13 1545  BP: 141/60 148/99 138/53 135/52  Pulse:  77 79 77  Temp:      TempSrc:      Resp: 20 21 21 19   Weight:      SpO2: 96% 96% 97% 94%       Antonietta Breach, PA-C 12/17/13 1615

## 2013-12-17 NOTE — ED Notes (Addendum)
Patient transported to CT 

## 2013-12-17 NOTE — H&P (Signed)
[facility] History and Physical  Patient: Lance Kemp  ZOX:[id number]  DOB: 08-Dec-1922  DOS: the patient was seen and examined on 12/17/2013 PCP: [name], MD  Chief Complaint: Shortness of breath  HPI: Lance Kemp is a 80 y.o. male with Past medical history of CVA, chronic venous insufficiency, peripheral neuropathy, non-STEMI, arthritis, hypertension. Patient presented with shortness of breath. He mentions that he has chronic shortness of breath. He also has chronic orthopnea with leg swelling. He wakes up multiple times in the night for urination but denies any PND. He denies any chest pain or palpitation dizziness fever chills cough nausea vomiting aspiration abdominal pain diarrhea constipation or changes in urinary habits. He has been started on multiple new medications over last few months. He was initially in [place] for 4-5 years and has moved here recently. He was given stocking to wear for his leg swelling few weeks ago and has developed irritation and redness in that area and has been having difficulty with stockings. He was seen by her provider at his assisted living facility and was found to have shortness of breath at rest with worsening swelling of his legs and therefore was sent here.  The patient is coming from .  assisted living facility And at his baseline independent for most of his ADL.  Review of Systems: as mentioned in the history of present illness.  A Comprehensive review of the other systems is negative.  Past Medical History  Diagnosis Date  . Osteoarthritis     hands/wrists  . Depression     antidepressant x 1 yr--situational. Resolved 08/2013 per pt/son.  . Glaucoma   . Hypertension   . 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  No significant residual deficit except short term memory impairment  . Urine incontinence   . Frequent headaches   . Chronic venous insufficiency     compression hose  . Peripheral neuropathy 2013     Dx'd by neuro in [place] at the time the patient was hospitalized briefly for pontine CVA.  . NSTEMI (non-ST elevated myocardial infarction) 06/2011    [place]: Troponin mildly elevated, normal EKG, normal ECHO, pt declined cardiology consult--per old PCP records.  . Basal cell carcinoma 2011    Face  . Chronic renal insufficiency, stage III (moderate) 2013    CrCl in the 50s   Past Surgical History  Procedure Laterality Date  . Cholecystectomy    . Tonsilectomy, adenoidectomy, bilateral myringotomy and tubes     Social History:  reports that he has quit smoking. He has never used smokeless tobacco. He reports that he does not drink alcohol or use illicit drugs.  Allergies  Allergen Reactions  . Altace [Ramipril] Other (See Comments)    As per old records from [place]  . Amoxicillin Other (See Comments)    Per old records from [place]  . Cozaar [Losartan Potassium] Other (See Comments)    As per old records from [place] History  Problem Relation Age of Onset  . Cancer Mother   . Heart disease Mother     Prior to Admission medications   Medication Sig Start Date End Date Taking? Authorizing Provider  acetaminophen (TYLENOL) 325 MG tablet Take 325 mg by mouth every 6 (six) hours as needed for mild pain.   Yes Historical Provider, MD  aspirin 81 MG chewable tablet Chew 81 mg by mouth daily.   Yes Historical Provider, MD  atorvastatin (LIPITOR) 20 MG tablet Take 20 mg  by mouth every evening.  10/28/13  Yes Historical Provider, MD  azelastine (OPTIVAR) 0.05 % ophthalmic solution Place 1 drop into the right eye 2 (two) times daily.  07/22/13  Yes Historical Provider, MD  BETOPTIC-S 0.25 % ophthalmic suspension Place 1 drop into the right eye daily.  07/22/13  Yes Historical Provider, MD  furosemide (LASIX) 20 MG tablet Take 20 mg by mouth every other day.   Yes Historical Provider, MD  hydrALAZINE (APRESOLINE) 10 MG tablet Take 10 mg by mouth 3 (three) times daily.  08/12/13  Yes  Historical Provider, MD  meclizine (ANTIVERT) 25 MG tablet Take 12.5 mg by mouth 2 (two) times daily as needed for dizziness.   Yes Historical Provider, MD  meloxicam (MOBIC) 7.5 MG tablet Take 7.5 mg by mouth daily.   Yes Bryson L Stilwell, PA-C  naproxen sodium (ANAPROX) 220 MG tablet Take 220 mg by mouth every 12 (twelve) hours as needed (for pain).   Yes Historical Provider, MD  tamsulosin (FLOMAX) 0.4 MG CAPS capsule Take 0.4 mg by mouth at bedtime.   Yes Historical Provider, MD  traMADol (ULTRAM) 50 MG tablet Take 25 mg by mouth every 12 (twelve) hours as needed for moderate pain.   Yes Historical Provider, MD  valsartan (DIOVAN) 40 MG tablet Take 20 mg by mouth daily.  08/16/13  Yes Historical Provider, MD    Physical Exam: Filed Vitals:   12/17/13 2030 12/17/13 2045 12/17/13 2145 12/17/13 2205  BP: 142/58 136/50  133/93  Pulse: 81 80 85 81  Temp:      TempSrc:      Resp: _0 Weight:      SpO2: 95% 98% 96% 97%    General: Alert, Awake and Oriented to Time, Place and Person. Appear in mild distress Eyes: PERRL ENT: Oral Mucosa clear moist. Neck: might JVD Cardiovascular: S1 and S2 Present,  no Murmur, Peripheral Pulses Present Respiratory: Bilateral Air entry equal and Decreased, basal crackles, no  wheezes Abdomen: Bowel Sound Present, Soft and Non tender Skin: No Rash Extremities: Bilateral Pedal edema with redness with a cut off line below the knee,  no  calf tenderness Neurologic: Grossly no focal neuro deficit.  Labs on Admission:  CBC:  Recent Labs Lab 12/17/13 1125  WBC 7.7  HGB 10.8*  HCT 31.3*  MCV 91.8  PLT 268    CMP     Component Value Date/Time   NA 136* 12/17/2013 1125   K 5.2 12/17/2013 1125   CL 98 12/17/2013 1125   CO2 24 12/17/2013 1125   GLUCOSE 110* 12/17/2013 1125   BUN 21 12/17/2013 1125   CREATININE 1.24 12/17/2013 1125   CALCIUM 9.1 12/17/2013 1125   GFRNONAA 50* 12/17/2013 1125   GFRAA 58* 12/17/2013 1125    No results found for this  basename: LIPASE, AMYLASE,  in the last 168 hours No results found for this basename: AMMONIA,  in the last 168 hours  No results found for this basename: CKTOTAL, CKMB, CKMBINDEX, TROPONINI,  in the last 168 hours BNP (last 3 results)  Recent Labs  12/17/13 1125  PROBNP 338.9    Radiological Exams on Admission: Ct Angio Chest Pe W/cm &/or Wo Cm  12/17/2013   CLINICAL DATA:  Shortness of breath. Lower extremity venous insufficiency. Clinical suspicion for pulmonary embolism.  EXAM: CT ANGIOGRAPHY CHEST WITH CONTRAST  TECHNIQUE: Multidetector CT imaging of the chest was performed using the standard protocol during bolus administration of intravenous contrast.  Multiplanar CT image reconstructions and MIPs were obtained to evaluate the vascular anatomy.  CONTRAST:  157m OMNIPAQUE IOHEXOL 350 MG/ML SOLN  COMPARISON:  None.  FINDINGS: Satisfactory opacification of pulmonary arteries noted, and no pulmonary emboli identified. No evidence of thoracic aortic dissection or aneurysm. No evidence of mediastinal hematoma or mass.  Mild cardiomegaly noted. No evidence of pleural or pericardial effusion. No evidence of pulmonary airspace disease or central endobronchial obstruction. No suspicious pulmonary nodules or masses are identified. Mild interstitial prominence noted in the lung bases bilaterally, likely chronic.  Review of the MIP images confirms the above findings.  IMPRESSION: No evidence of pulmonary embolism or other acute findings.  Mild cardiomegaly.   Electronically Signed   By: JEarle GellM.D.   On: 12/17/2013 17:21   Dg Chest Port 1 View  12/17/2013   CLINICAL DATA:  Shortness of breath.  EXAM: PORTABLE CHEST - 1 VIEW  COMPARISON:  None.  FINDINGS: The heart size and mediastinal contours are within normal limits. Both lungs are clear. The visualized skeletal structures are unremarkable.  IMPRESSION: No active disease.   Electronically Signed   By: JEarle GellM.D.   On: 12/17/2013 11:34     EKG: Independently reviewed. normal sinus rhythm, nonspecific ST and T waves changes.  Assessment/Plan Principal Problem:   Hypoxia Active Problems:   Venous insufficiency of both lower extremities   HTN (hypertension), benign   History of CVA (cerebrovascular accident)   Skin irritation   1. Hypoxia  Patient presents with complaints of shortness of breath has chronic heart failure and is on Lasix and has bilateral leg swelling secondary to chronic venous insufficiency. He was a former smoker. He has exposure to chemicals as he was working in [employer] He also had a dog but no other pets. Patient was found to have hypoxia only on exertion with objective tachypnea along with shortness of breath. CT scan shows bilateral diffuse reticular pattern on   Lung window, primarily in the dependent area this could represent heart failure or possibly ILD. We will get echocardiogram with a bubble study to look for shunt and treat him with incentive spirometry and one dose of IV Lasix for possible heart failure. Check ESR and CRP . Oxygen as needed   2. bilateral lower leg irritation The redness on his leg has been present for last 1 week patient does not have any leukocytosis or fever This can be present an allergic reaction to stockings. We'll check ESR and CRP if elevated would treat the patient with antibiotics.  3. Hypertension Patient is unsure whether he is on Diovan, and due to his listed energy I would hold it I would continue the other medications for his blood pressure  DVT Prophylaxis: subcutaneous Heparin Nutrition:  cardiac diet   Code Status: DNR/DNI and   Disposition: Admitted to observation in telemetry unit.  Author: PBerle Mull MD [facility] Pager: [phone]/06/2013, 10:06 PM    If 7PM-7AM, please contact night-coverage [email or web address] Password [email or web address]  **Disclaimer: This note may have been dictated with voice recognition software. Similar  sounding words can inadvertently be transcribed and this note may contain transcription errors which may not have been corrected upon publication of note.**

## 2013-12-17 NOTE — ED Provider Notes (Signed)
4:37 PM BP 135/52  Pulse 77  Temp(Src) 97.5 F (36.4 C) (Oral)  Resp 19  Wt 165 lb (74.844 kg)  SpO2 94% Assumed care of the patient from [place]. Patient is an 24 y/ with pmh htn, CVA, NSTEMI, ckd stage 3 who presents for SOB bitgh exertional and at rest. bl LE pitting edema x 1.5 mo.  No known hx CHF or COPD. NO DVT on vascular US.   8:10 PM CTA negative. His Physical exam shows BL LE piting edema. It is Red and warm. ? Developing cellulitis vs phlebitis. He sounds mildly SOB when speaking. The patient was ambulated with pulse ox and desaturated to 86% within about 15 feet of ambulation. No evidence of pulmonary edema.  Will call for admission.   Patient ambulated a second time per request of admiting physician as it is the reason for his admission. He again desaturated to 87%. Dr. Posey Pronto will admit the patient.  Patient / Family / Caregiver informed of clinical course, understand medical decision-making process, and agree with plan.  I personally reviewed the imaging tests through PACS system. I have reviewed and interpreted Lab values. I reviewed available ER/hospitalization records through the EMR   Results for orders placed during the hospital encounter of 12/17/13  CBC      Result Value Ref Range   WBC 7.7  4.0 - 10.5 K/uL   RBC 3.41 (*) 4.22 - 5.81 MIL/uL   Hemoglobin 10.8 (*) 13.0 - 17.0 g/dL   HCT 31.3 (*) 39.0 - 52.0 %   MCV 91.8  78.0 - 100.0 fL   MCH 31.7  26.0 - 34.0 pg   MCHC 34.5  30.0 - 36.0 g/dL   RDW 13.5  11.5 - 15.5 %   Platelets 268  150 - 400 K/uL  BASIC METABOLIC PANEL      Result Value Ref Range   Sodium 136 (*) 137 - 147 mEq/L   Potassium 5.2  3.7 - 5.3 mEq/L   Chloride 98  96 - 112 mEq/L   CO2 24  19 - 32 mEq/L   Glucose, Bld 110 (*) 70 - 99 mg/dL   BUN 21  6 - 23 mg/dL   Creatinine, Ser 1.24  0.50 - 1.35 mg/dL   Calcium 9.1  8.4 - 10.5 mg/dL   GFR calc non Af Amer 50 (*) >90 mL/min   GFR calc Af Amer 58 (*) >90 mL/min   Anion gap 14  5  - 15  PRO B NATRIURETIC PEPTIDE      Result Value Ref Range   Pro B Natriuretic peptide (BNP) 338.9  0 - 450 pg/mL  TSH      Result Value Ref Range   TSH 4.070  0.350 - 4.500 uIU/mL  TROPONIN I      Result Value Ref Range   Troponin I <0.30  <0.30 ng/mL  TROPONIN I      Result Value Ref Range   Troponin I <0.30  <0.30 ng/mL  COMPREHENSIVE METABOLIC PANEL      Result Value Ref Range   Sodium 137  137 - 147 mEq/L   Potassium 5.0  3.7 - 5.3 mEq/L   Chloride 101  96 - 112 mEq/L   CO2 27  19 - 32 mEq/L   Glucose, Bld 112 (*) 70 - 99 mg/dL   BUN 19  6 - 23 mg/dL   Creatinine, Ser 1.20  0.50 - 1.35 mg/dL   Calcium 8.5  8.4 -  10.5 mg/dL   Total Protein 5.3 (*) 6.0 - 8.3 g/dL   Albumin 2.8 (*) 3.5 - 5.2 g/dL   AST 22  0 - 37 U/L   ALT 51  0 - 53 U/L   Alkaline Phosphatase 97  39 - 117 U/L   Total Bilirubin 0.3  0.3 - 1.2 mg/dL   GFR calc non Af Amer 52 (*) >90 mL/min   GFR calc Af Amer 60 (*) >90 mL/min   Anion gap 9  5 - 15  CBC      Result Value Ref Range   WBC 7.0  4.0 - 10.5 K/uL   RBC 3.10 (*) 4.22 - 5.81 MIL/uL   Hemoglobin 9.6 (*) 13.0 - 17.0 g/dL   HCT 28.1 (*) 39.0 - 52.0 %   MCV 90.6  78.0 - 100.0 fL   MCH 31.0  26.0 - 34.0 pg   MCHC 34.2  30.0 - 36.0 g/dL   RDW 13.8  11.5 - 15.5 %   Platelets 275  150 - 400 K/uL  PROTIME-INR      Result Value Ref Range   Prothrombin Time 15.4 (*) 11.6 - 15.2 seconds   INR 1.22  0.00 - 1.49  SEDIMENTATION RATE      Result Value Ref Range   Sed Rate 28 (*) 0 - 16 mm/hr  I-STAT TROPOININ, ED      Result Value Ref Range   Troponin i, poc 0.00  0.00 - 0.08 ng/mL   Comment 3           I-STAT TROPOININ, ED      Result Value Ref Range   Troponin i, poc 0.01  0.00 - 0.08 ng/mL   Comment 3               Margarita Mail, PA-C 12/18/13 0945

## 2013-12-18 ENCOUNTER — Ambulatory Visit: Payer: Medicare Other | Admitting: Family Medicine

## 2013-12-18 DIAGNOSIS — M545 Low back pain, unspecified: Secondary | ICD-10-CM | POA: Diagnosis present

## 2013-12-18 DIAGNOSIS — R0602 Shortness of breath: Secondary | ICD-10-CM

## 2013-12-18 HISTORY — PX: TRANSTHORACIC ECHOCARDIOGRAM: SHX275

## 2013-12-18 LAB — PROTIME-INR
INR: 1.22 (ref 0.00–1.49)
Prothrombin Time: 15.4 seconds — ABNORMAL HIGH (ref 11.6–15.2)

## 2013-12-18 LAB — CBC
HCT: 28.1 % — ABNORMAL LOW (ref 39.0–52.0)
Hemoglobin: 9.6 g/dL — ABNORMAL LOW (ref 13.0–17.0)
MCH: 31 pg (ref 26.0–34.0)
MCHC: 34.2 g/dL (ref 30.0–36.0)
MCV: 90.6 fL (ref 78.0–100.0)
Platelets: 275 10*3/uL (ref 150–400)
RBC: 3.1 MIL/uL — AB (ref 4.22–5.81)
RDW: 13.8 % (ref 11.5–15.5)
WBC: 7 10*3/uL (ref 4.0–10.5)

## 2013-12-18 LAB — COMPREHENSIVE METABOLIC PANEL
ALBUMIN: 2.8 g/dL — AB (ref 3.5–5.2)
ALT: 51 U/L (ref 0–53)
AST: 22 U/L (ref 0–37)
Alkaline Phosphatase: 97 U/L (ref 39–117)
Anion gap: 9 (ref 5–15)
BUN: 19 mg/dL (ref 6–23)
CALCIUM: 8.5 mg/dL (ref 8.4–10.5)
CO2: 27 mEq/L (ref 19–32)
CREATININE: 1.2 mg/dL (ref 0.50–1.35)
Chloride: 101 mEq/L (ref 96–112)
GFR calc Af Amer: 60 mL/min — ABNORMAL LOW (ref >90)
GFR, EST NON AFRICAN AMERICAN: 52 mL/min — AB (ref >90)
Glucose, Bld: 112 mg/dL — ABNORMAL HIGH (ref 70–99)
Potassium: 5 mEq/L (ref 3.7–5.3)
Sodium: 137 mEq/L (ref 137–147)
Total Bilirubin: 0.3 mg/dL (ref 0.3–1.2)
Total Protein: 5.3 g/dL — ABNORMAL LOW (ref 6.0–8.3)

## 2013-12-18 LAB — TROPONIN I
Troponin I: <0.3 ng/mL (ref <0.30)
Troponin I: <0.3 ng/mL (ref <0.30)

## 2013-12-18 LAB — C-REACTIVE PROTEIN: CRP: 1.9 mg/dL — AB (ref <0.60)

## 2013-12-18 LAB — TSH: TSH: 4.07 u[IU]/mL (ref 0.350–4.500)

## 2013-12-18 LAB — SEDIMENTATION RATE: SED RATE: 28 mm/h — AB (ref 0–16)

## 2013-12-18 MED ORDER — FUROSEMIDE 10 MG/ML IJ SOLN
40.0000 mg | Freq: Two times a day (BID) | INTRAMUSCULAR | Status: DC
  Administered 2013-12-18 – 2013-12-19 (×3): 40 mg via INTRAVENOUS
  Filled 2013-12-18 (×5): qty 4

## 2013-12-18 MED ORDER — IRBESARTAN 75 MG PO TABS
75.0000 mg | ORAL_TABLET | Freq: Every day | ORAL | Status: DC
  Administered 2013-12-18 – 2013-12-20 (×3): 75 mg via ORAL
  Filled 2013-12-18 (×3): qty 1

## 2013-12-18 MED ORDER — ACETAMINOPHEN 325 MG PO TABS
650.0000 mg | ORAL_TABLET | Freq: Three times a day (TID) | ORAL | Status: DC
  Administered 2013-12-18 – 2013-12-20 (×7): 650 mg via ORAL
  Filled 2013-12-18 (×7): qty 2

## 2013-12-18 NOTE — Progress Notes (Signed)
Clinical Social Work Department BRIEF PSYCHOSOCIAL ASSESSMENT 12/18/2013  Patient:  CANDY, ZIEGLER     Account Number:  [id number]     Admit date:  12/17/2013  Clinical Social Worker:  Megan Salon  Date/Time:  12/18/2013 10:38 AM  Referred by:  Physician  Date Referred:  12/18/2013 Referred for  ALF Placement   Other Referral:   Interview type:  Patient Other interview type:    PSYCHOSOCIAL DATA Living Status:  FACILITY Admitted from facility:  [facility] Level of care:  Assisted Living Primary support name:   Primary support relationship to patient:  CHILD, ADULT Degree of support available:   Good    CURRENT CONCERNS Current Concerns  Post-Acute Placement   Other Concerns:    SOCIAL WORK ASSESSMENT / PLAN CSW received referral that patient is from [place]. CSW went to speak with patient by bedside. CSW explained reason for visit and social worker role. Patient stated he is from [facility] and is ready to go back there, hoping they saved his room. Patient states he has a supportive son who will take patient back to ALF when he is medically ready.   Assessment/plan status:  Psychosocial Support/Ongoing Assessment of Needs Other assessment/ plan:   Information/referral to community resources:   CSW information    PATIENT'S/FAMILY'S RESPONSE TO PLAN OF CARE: Patient stated he is from ALF and would like to go back there when patient is medically ready.        Jeanette Caprice, MSW, [place]

## 2013-12-18 NOTE — Care Management Note (Signed)
    Page 1 of 1   12/20/2013     12:15:49 PM CARE MANAGEMENT NOTE 12/20/2013  Patient:  Lance Kemp, Lance Kemp   Account Number:  [id number]  Date Initiated:  12/18/2013  Documentation initiated by:  AMERSON,JULIE  Subjective/Objective Assessment:   Pt adm on 12/17/13 with LE edema, hypoxia.  PTA, pt resides at [facility].     Action/Plan:   CSW consulted to facilitate return to ALF when medically stable for dc.  Will follow progress.   Anticipated DC Date:  12/20/2013   Anticipated DC Plan:  ASSISTED LIVING / REST HOME  In-house referral  Clinical Social Worker      DC Planning Services  CM consult      Choice offered to / List presented to:          [facility] arranged  HH-2 PT      Status of service:  Completed, signed off Medicare Important Message given?  YES (If response is "NO", the following Medicare IM given date fields will be blank) Date Medicare IM given:  12/17/2013 Medicare IM given by:   Date Additional Medicare IM given:   Additional Medicare IM given by:    Discharge Disposition:  [place]  Per UR Regulation:  Reviewed for med. necessity/level of care/duration of stay  If discussed at [place] of Stay Meetings, dates discussed:    Comments:  12/20/13 11:50 CM met with pt in room to discuss HHPT.  Pt states [facility] provides HHPT (as long as it is mentioned in his discharge-it is!) and please do not contract with an outside agency bc his insurance may not cover it.  CM states understanding.  CM called CSW, Mel Almond to arrange for pt's return to [facility].  No other CM needs were communicated.  Mariane Masters, BSN, CM [phone].

## 2013-12-18 NOTE — Progress Notes (Signed)
Pt ambulated on room air, O2 sat remained above 93% Rickard Rhymes, RN

## 2013-12-18 NOTE — Progress Notes (Signed)
CSW received phone call from [facility] stating they can take the patient back over the weekend if patient is medically stable and will need the FL2 completed with medications. CSW will continue to follow for readiness for DC.  Jeanette Caprice, MSW, [place]

## 2013-12-18 NOTE — Progress Notes (Addendum)
Chart reviewed.   [facility] PROGRESS NOTE  Lance Kemp SEG:[id number] DOB: 10/19/1922 DOA: 12/17/2013 PCP: [name], MD  Assessment/Plan:  Principal Problem:   Hypoxia resolved. Suspect fluid overload despite negative CXR and equivocal pro BNP.  Weight is up about 8 lbs since March and has massively swollen legs. Will increase lasix to 40 iv bid. TSH only minimally elevated.  Await echo. Active Problems:   Severe edema: see above. Doppler negative for DVT. Elevate legs. Stop mobic in case contributing   HTN (hypertension), benign   History of CVA (cerebrovascular accident)   Skin irritation from ted hose   Code Status:  DNR Family Communication:   Disposition Plan:  Back to ALF when edema better Consultants:    Procedures:     Antibiotics:    HPI/Subjective: Wants to go home.  Objective: Filed Vitals:   12/18/13 0456  BP: 137/79  Pulse: 73  Temp: 97.6 F (36.4 C)  Resp: 19    Intake/Output Summary (Last 24 hours) at 12/18/13 1117 Last data filed at 12/18/13 0800  Gross per 24 hour  Intake    440 ml  Output   1800 ml  Net  -1360 ml   Filed Weights   12/17/13 1051 12/17/13 2227  Weight: 74.844 kg (165 lb) 75.1 kg (165 lb 9.1 oz)    Exam:   General:  Alert. Comfortable. Forgetful. appropriate  Cardiovascular: RRR without MGR  Respiratory: CTA without WRR  Abdomen: s, nt, nd  Ext: 3+ edema  Basic Metabolic Panel:  Recent Labs Lab 12/17/13 1125 12/18/13 0355  NA 136* 137  K 5.2 5.0  CL 98 101  CO2 24 27  GLUCOSE 110* 112*  BUN 21 19  CREATININE 1.24 1.20  CALCIUM 9.1 8.5   Liver Function Tests:  Recent Labs Lab 12/18/13 0355  AST 22  ALT 51  ALKPHOS 97  BILITOT 0.3  PROT 5.3*  ALBUMIN 2.8*   No results found for this basename: LIPASE, AMYLASE,  in the last 168 hours No results found for this basename: AMMONIA,  in the last 168 hours CBC:  Recent Labs Lab 12/17/13 1125 12/18/13 0355  WBC 7.7 7.0   HGB 10.8* 9.6*  HCT 31.3* 28.1*  MCV 91.8 90.6  PLT 268 275   Cardiac Enzymes:  Recent Labs Lab 12/17/13 2252 12/18/13 0355  TROPONINI <0.30 <0.30   BNP (last 3 results)  Recent Labs  12/17/13 1125  PROBNP 338.9   CBG: No results found for this basename: GLUCAP,  in the last 168 hours  No results found for this or any previous visit (from the past 240 hour(s)).   Studies: Ct Angio Chest Pe W/cm &/or Wo Cm  12/17/2013   CLINICAL DATA:  Shortness of breath. Lower extremity venous insufficiency. Clinical suspicion for pulmonary embolism.  EXAM: CT ANGIOGRAPHY CHEST WITH CONTRAST  TECHNIQUE: Multidetector CT imaging of the chest was performed using the standard protocol during bolus administration of intravenous contrast. Multiplanar CT image reconstructions and MIPs were obtained to evaluate the vascular anatomy.  CONTRAST:  146mL OMNIPAQUE IOHEXOL 350 MG/ML SOLN  COMPARISON:  None.  FINDINGS: Satisfactory opacification of pulmonary arteries noted, and no pulmonary emboli identified. No evidence of thoracic aortic dissection or aneurysm. No evidence of mediastinal hematoma or mass.  Mild cardiomegaly noted. No evidence of pleural or pericardial effusion. No evidence of pulmonary airspace disease or central endobronchial obstruction. No suspicious pulmonary nodules or masses are identified. Mild interstitial prominence noted in the lung bases  bilaterally, likely chronic.  Review of the MIP images confirms the above findings.  IMPRESSION: No evidence of pulmonary embolism or other acute findings.  Mild cardiomegaly.   Electronically Signed   By: Earle Gell M.D.   On: 12/17/2013 17:21   Dg Chest Port 1 View  12/17/2013   CLINICAL DATA:  Shortness of breath.  EXAM: PORTABLE CHEST - 1 VIEW  COMPARISON:  None.  FINDINGS: The heart size and mediastinal contours are within normal limits. Both lungs are clear. The visualized skeletal structures are unremarkable.  IMPRESSION: No active disease.    Electronically Signed   By: Earle Gell M.D.   On: 12/17/2013 11:34    Scheduled Meds: . acetaminophen  650 mg Oral TID  . aspirin  81 mg Oral Daily  . atorvastatin  20 mg Oral QPM  . betaxolol  1 drop Right Eye Daily  . furosemide  40 mg Intravenous BID  . heparin  5,000 Units Subcutaneous 3 times per day  . irbesartan  75 mg Oral Daily  . tamsulosin  0.4 mg Oral QHS   Continuous Infusions:   Time spent: 35 minutes  [place] [facility] Pager [phone]. If 7PM-7AM, please contact night-coverage at [email or web address], password [email or web address] 12/18/2013, 11:17 AM  LOS: 1 day

## 2013-12-18 NOTE — Progress Notes (Signed)
  Echocardiogram 2D Echocardiogram (with Bubble study) has been performed.  Basilia Jumbo 12/18/2013, 12:38 PM

## 2013-12-18 NOTE — Progress Notes (Signed)
UR Completed.  Daiel Strohecker Jane [phone] 12/18/2013  

## 2013-12-19 LAB — BASIC METABOLIC PANEL
ANION GAP: 12 (ref 5–15)
BUN: 20 mg/dL (ref 6–23)
CHLORIDE: 96 meq/L (ref 96–112)
CO2: 27 mEq/L (ref 19–32)
CREATININE: 1.37 mg/dL — AB (ref 0.50–1.35)
Calcium: 8.7 mg/dL (ref 8.4–10.5)
GFR, EST AFRICAN AMERICAN: 51 mL/min — AB (ref >90)
GFR, EST NON AFRICAN AMERICAN: 44 mL/min — AB (ref >90)
Glucose, Bld: 105 mg/dL — ABNORMAL HIGH (ref 70–99)
Potassium: 4.2 mEq/L (ref 3.7–5.3)
Sodium: 135 mEq/L — ABNORMAL LOW (ref 137–147)

## 2013-12-19 LAB — CBC
HCT: 28.5 % — ABNORMAL LOW (ref 39.0–52.0)
HEMOGLOBIN: 9.8 g/dL — AB (ref 13.0–17.0)
MCH: 31.1 pg (ref 26.0–34.0)
MCHC: 34.4 g/dL (ref 30.0–36.0)
MCV: 90.5 fL (ref 78.0–100.0)
PLATELETS: 293 10*3/uL (ref 150–400)
RBC: 3.15 MIL/uL — ABNORMAL LOW (ref 4.22–5.81)
RDW: 13.5 % (ref 11.5–15.5)
WBC: 7 10*3/uL (ref 4.0–10.5)

## 2013-12-19 MED ORDER — FUROSEMIDE 40 MG PO TABS: 40.0000 mg | ORAL_TABLET | Freq: Every day | ORAL | Status: DC

## 2013-12-19 MED ORDER — FUROSEMIDE 40 MG PO TABS
40.0000 mg | ORAL_TABLET | Freq: Two times a day (BID) | ORAL | Status: DC
  Filled 2013-12-19 (×2): qty 1

## 2013-12-19 MED ORDER — FUROSEMIDE 40 MG PO TABS
40.0000 mg | ORAL_TABLET | Freq: Two times a day (BID) | ORAL | Status: DC
  Administered 2013-12-19 – 2013-12-20 (×2): 40 mg via ORAL
  Filled 2013-12-19 (×4): qty 1

## 2013-12-19 NOTE — Progress Notes (Signed)
[facility] PROGRESS NOTE  Lance Kemp LOV:[id number] DOB: December 17, 1922 DOA: 12/17/2013 PCP: [name], MD  Brief narrative: Mr. Bushey is an 80 yo man with PMH of edema, HTN, CVA, peripheral neuropathy, NSTEMI, HTN who presented with SOB and hypoxia along with worsening edema.  He was given stockings to wear from his PCP and has skin irritation from them.  TTE revealed grade 1 diastolic dysfunction.  He has been on IV lasix and has had 1.5L out 2 days ago and 2.1L out yesterday.  His weight is unchanged.    Hypoxia - resolved  dCHF with venous insufficiency and LE edema - Improved per patient.  - His lungs are clear today on exam - His output has been brisk with IV lasix and his renal function is slightly worse today - Will transition to PO lasix 40mg  BID and evaluate clinically tomorrow.   - TTE as noted above, with grade 1 diastolic dysfunction - I think he will be ready to leave the hospital tomorrow.   HTN (hypertension), benign - Relatively well controlled for his age - He is on the lasix and avapro which will be continued  History of CVA (cerebrovascular accident) - No acute neuro changes  BPH associated with nocturia On tamsulosin, continue  Skin irritation - If not improved with stopping compression stockings, would try a steroid cream for relief  Lumbago - No complaints of back pain.      Procedures/Studies: Ct Angio Chest Pe W/cm &/or Wo Cm  12/17/2013   CLINICAL DATA:  Shortness of breath. Lower extremity venous insufficiency. Clinical suspicion for pulmonary embolism.  EXAM: CT ANGIOGRAPHY CHEST WITH CONTRAST  TECHNIQUE: Multidetector CT imaging of the chest was performed using the standard protocol during bolus administration of intravenous contrast. Multiplanar CT image reconstructions and MIPs were obtained to evaluate the vascular anatomy.  CONTRAST:  148mL OMNIPAQUE IOHEXOL 350 MG/ML SOLN  COMPARISON:  None.  FINDINGS: Satisfactory opacification of  pulmonary arteries noted, and no pulmonary emboli identified. No evidence of thoracic aortic dissection or aneurysm. No evidence of mediastinal hematoma or mass.  Mild cardiomegaly noted. No evidence of pleural or pericardial effusion. No evidence of pulmonary airspace disease or central endobronchial obstruction. No suspicious pulmonary nodules or masses are identified. Mild interstitial prominence noted in the lung bases bilaterally, likely chronic.  Review of the MIP images confirms the above findings.  IMPRESSION: No evidence of pulmonary embolism or other acute findings.  Mild cardiomegaly.   Electronically Signed   By: Earle Gell M.D.   On: 12/17/2013 17:21   Dg Chest Port 1 View  12/17/2013   CLINICAL DATA:  Shortness of breath.  EXAM: PORTABLE CHEST - 1 VIEW  COMPARISON:  None.  FINDINGS: The heart size and mediastinal contours are within normal limits. Both lungs are clear. The visualized skeletal structures are unremarkable.  IMPRESSION: No active disease.   Electronically Signed   By: Earle Gell M.D.   On: 12/17/2013 11:34       Code Status: DNR Family Communication: Pt at bedside, plan to call son Gerald Stabs at [phone] Disposition Plan: Back to ALF  HPI/Subjective: Reports difficulty starting stream X 1  Objective: Filed Vitals:   12/18/13 1416 12/18/13 2052 12/19/13 0455 12/19/13 0500  BP: 119/57 143/41 139/57   Pulse: 71 81 79   Temp: 98.1 F (36.7 C) 97.6 F (36.4 C) 97.5 F (36.4 C)   TempSrc: Oral Oral Oral   Resp: 20 18 18    Height:  Weight:    166 lb 4.8 oz (75.433 kg)  SpO2: 97% 98% 97%     Intake/Output Summary (Last 24 hours) at 12/19/13 1056 Last data filed at 12/19/13 4742  Gross per 24 hour  Intake    200 ml  Output   2150 ml  Net  -1950 ml    Exam:   General:  Pt is alert, follows commands appropriately, not in acute distress  Cardiovascular: Regular rate and rhythm, S1/S2, no murmurs  Respiratory: Clear to auscultation bilaterally, no  wheezing, no crackles  Abdomen: Soft, non tender, non distended, bowel sounds present  Extremities: 3+ edema to bilateral LE to knees, + red discoloration with sharp demarcation, possibly due to previous stockings.    Neuro: Grossly nonfocal  Data Reviewed: Basic Metabolic Panel:  Recent Labs Lab 12/17/13 1125 12/18/13 0355 12/19/13 0500  NA 136* 137 135*  K 5.2 5.0 4.2  CL 98 101 96  CO2 24 27 27   GLUCOSE 110* 112* 105*  BUN 21 19 20   CREATININE 1.24 1.20 1.37*  CALCIUM 9.1 8.5 8.7   Liver Function Tests:  Recent Labs Lab 12/18/13 0355  AST 22  ALT 51  ALKPHOS 97  BILITOT 0.3  PROT 5.3*  ALBUMIN 2.8*   CBC:  Recent Labs Lab 12/17/13 1125 12/18/13 0355 12/19/13 0500  WBC 7.7 7.0 7.0  HGB 10.8* 9.6* 9.8*  HCT 31.3* 28.1* 28.5*  MCV 91.8 90.6 90.5  PLT 268 275 293   Cardiac Enzymes:  Recent Labs Lab 12/17/13 2252 12/18/13 0355 12/18/13 1029  TROPONINI <0.30 <0.30 <0.30     Scheduled Meds: . acetaminophen  650 mg Oral TID  . aspirin  81 mg Oral Daily  . atorvastatin  20 mg Oral QPM  . betaxolol  1 drop Right Eye Daily  . furosemide  40 mg Intravenous BID  . heparin  5,000 Units Subcutaneous 3 times per day  . irbesartan  75 mg Oral Daily  . tamsulosin  0.4 mg Oral QHS   Continuous Infusions:    Gilles Chiquito, MD  [place] Pager [phone]  If 7PM-7AM, please contact night-coverage [email or web address] Password [email or web address] 12/19/2013, 10:56 AM   LOS: 2 days

## 2013-12-20 DIAGNOSIS — L989 Disorder of the skin and subcutaneous tissue, unspecified: Secondary | ICD-10-CM

## 2013-12-20 DIAGNOSIS — M545 Low back pain, unspecified: Secondary | ICD-10-CM

## 2013-12-20 LAB — BASIC METABOLIC PANEL
ANION GAP: 13 (ref 5–15)
BUN: 22 mg/dL (ref 6–23)
CO2: 26 mEq/L (ref 19–32)
Calcium: 8.6 mg/dL (ref 8.4–10.5)
Chloride: 98 mEq/L (ref 96–112)
Creatinine, Ser: 1.28 mg/dL (ref 0.50–1.35)
GFR calc non Af Amer: 48 mL/min — ABNORMAL LOW (ref >90)
GFR, EST AFRICAN AMERICAN: 56 mL/min — AB (ref >90)
Glucose, Bld: 113 mg/dL — ABNORMAL HIGH (ref 70–99)
POTASSIUM: 4.4 meq/L (ref 3.7–5.3)
SODIUM: 137 meq/L (ref 137–147)

## 2013-12-20 MED ORDER — TRAMADOL HCL 50 MG PO TABS: 25.0000 mg | ORAL_TABLET | Freq: Two times a day (BID) | ORAL | Status: DC | PRN

## 2013-12-20 MED ORDER — FUROSEMIDE 40 MG PO TABS: 40.0000 mg | ORAL_TABLET | Freq: Two times a day (BID) | ORAL | Status: DC

## 2013-12-20 NOTE — Evaluation (Signed)
Physical Therapy Evaluation and D/C Patient Details Name: Lance Kemp MRN: [id number] DOB: 1922/09/25 Today's Date: 12/20/2013   History of Present Illness  Pt admit with hypoxia, CHF, HTN and LE edema.    Clinical Impression  Pt admitted with above. Pt currently without significant functional limitations and able to ambulate with RW with Modif I.  Pt will benefit from skilled PT at A living on d/c to increase their independence and safety with mobility to allow discharge to the venue listed below. Pt plan for d/c today so recommend HHPT f/u for safety eval and balance training at A living.      Follow Up Recommendations Home health PT;Supervision - Intermittent    Equipment Recommendations  None recommended by PT    Recommendations for Other Services       Precautions / Restrictions Precautions Precautions: Fall Restrictions Weight Bearing Restrictions: No      Mobility  Bed Mobility Overal bed mobility: Independent                Transfers Overall transfer level: Independent Equipment used: Rolling walker (2 wheeled)                Ambulation/Gait Ambulation/Gait assistance: Supervision Ambulation Distance (Feet): 150 Feet Assistive device: Rolling walker (2 wheeled) Gait Pattern/deviations: Step-through pattern;Decreased stride length   Gait velocity interpretation: Below normal speed for age/gender General Gait Details: Pt ambulated with RW with overall safe gait.  On two occasions, pt caught foot with slight LOB which pt recovered.  Pt states his LEs feel heavy and that its "my shoes tripping me cause they are tight."  Overall safety is good however feel HHPT is warranted given that pt at A living and is ambulating alone at times.    Stairs            Wheelchair Mobility    Modified Rankin (Stroke Patients Only)       Balance Overall balance assessment: Needs assistance;History of Falls Sitting-balance support: No upper extremity  supported;Feet supported Sitting balance-Leahy Scale: Good     Standing balance support: Bilateral upper extremity supported;During functional activity Standing balance-Leahy Scale: Fair Standing balance comment: Can stand statically without UE support for brief periods of time.               High level balance activites: Turns;Head turns;Direction changes High Level Balance Comments: Above with RW with supervision             Pertinent Vitals/Pain VSS, No pain    Home Living Family/patient expects to be discharged to:: Assisted living               Home Equipment: Walker - 2 wheels;Tub bench;Grab bars - tub/shower;Hand held shower head;Grab bars - toilet (handicapped toilet)      Prior Function Level of Independence: Independent with assistive device(s)               Hand Dominance   Dominant Hand: Right    Extremity/Trunk Assessment   Upper Extremity Assessment: Defer to OT evaluation           Lower Extremity Assessment: Overall WFL for tasks assessed      Cervical / Trunk Assessment: Normal  Communication   Communication: No difficulties  Cognition Arousal/Alertness: Awake/alert Behavior During Therapy: WFL for tasks assessed/performed Overall Cognitive Status: Within Functional Limits for tasks assessed                      General  Comments General comments (skin integrity, edema, etc.): Bil LE edema    Exercises General Exercises - Lower Extremity Ankle Circles/Pumps: AROM;Both;10 reps;Supine Long Arc Quad: AROM;Both;10 reps;Seated      Assessment/Plan    PT Assessment All further PT needs can be met in the next venue of care  PT Diagnosis Difficulty walking   PT Problem List Decreased balance;Decreased mobility  PT Treatment Interventions     PT Goals (Current goals can be found in the Care Plan section) Acute Rehab PT Goals Patient Stated Goal: to go back to A living PT Goal Formulation: No goals set, d/c  therapy    Frequency     Barriers to discharge        Co-evaluation               End of Session Equipment Utilized During Treatment: Gait belt Activity Tolerance: Patient limited by fatigue Patient left: in chair;with call bell/phone within reach Nurse Communication: Mobility status         Time: 2258-3462 PT Time Calculation (min): 14 min   Charges:   PT Evaluation $Initial PT Evaluation Tier I: 1 Procedure PT Treatments $Gait Training: 8-22 mins   PT G Codes:          INGOLD,Denney Shein 01-15-2014, 12:02 PM  [facility] Acute Rehabilitation [phone] (pager)

## 2013-12-20 NOTE — Progress Notes (Signed)
Patient is medically stable for D/C to [facility]. Clinical Social Worker (CSW) spoke with Caren Griffins med tech who reported that patient can return. CSW updated FL2 with D/C medications. CSW faxed D/C summary and FL2 to [facility]. Patient's son Gerald Stabs was at bedside and is providing transportation for patient. CSW gave son D/C packet. Nursing is aware of above. Please reconsult if further social work needs arise. CSW signing off.   Blima Rich, [place] Weekend CSW [phone]

## 2013-12-20 NOTE — Discharge Summary (Addendum)
Physician Discharge Summary  Lance Kemp POE:[id number] DOB: [birth year] DOA: 12/17/2013  PCP: [name], MD  Admit date: 12/17/2013 Discharge date: 12/24/2013  Recommendations for Outpatient Follow-up:  1. Pt will need to follow up with PCP in 2-3 weeks post discharge 2. Please obtain BMP to evaluate electrolytes and kidney function 3. Please also check CBC to evaluate Hg and Hct levels 4. Home Health PT  Discharge Diagnoses:  Principal Problem:   Hypoxia Active Problems:   Venous insufficiency of both lower extremities   HTN (hypertension), benign   History of CVA (cerebrovascular accident)   BPH associated with nocturia   Skin irritation   Lumbago   Acute on chronic diastolic heart failure    Discharge Condition: Stable  Diet recommendation: Heart healthy diet   History of present illness:  Lance Kemp is an 80 yo man with PMH of edema, HTN, CVA, peripheral neuropathy, NSTEMI, HTN who presented with SOB and hypoxia along with worsening edema. He was given stockings to wear from his PCP and has apparent skin irritation from them. TTE revealed grade 1 diastolic dysfunction. He was on IV lasix and has had good urine output. He was transitioned to PO lasix and is doing well, renal function okay   Hospital Course:  Hypoxia: related to volume overload, resolved at discharge  dCHF with Venous insufficiency of both lower extremities and LE edema: Worsened at ALF and associated with hypoxia and SOB. He  Was placed on lasix IV originally and transitioned to PO lasix.  He did very well and quickly his hypoxia resolved.  His LE edema also resolved quickly.  He will go home on BID lasix which will need to be monitored closely and decreased as appropriate.  He should have an appointment with his PCP in 2 weeks.  He did have some renal dysfunction with lasix which improved on day of discharge.  Would stop mobic given possible worsening renal function on lasix.     HTN (hypertension),  benign: Well controlled while in the hospital.  Continue avapro and lasix.   BPH associated with nocturia: had a couple of episodes of decreased flow.  He was maintained on his home dose of tamsulosin.   Skin irritation: Thought to be related to swelling and compression stockings.  He did not have the stockings while in the hospital.  Would discontinue until irritation improves.   Lumbago: Had some episodes of chronic back pain while in house.  Improved with home medication tramadol.     Procedures/Studies: Ct Angio Chest Pe W/cm &/or Wo Cm  12/17/2013   CLINICAL DATA:  Shortness of breath. Lower extremity venous insufficiency. Clinical suspicion for pulmonary embolism.  EXAM: CT ANGIOGRAPHY CHEST WITH CONTRAST  TECHNIQUE: Multidetector CT imaging of the chest was performed using the standard protocol during bolus administration of intravenous contrast. Multiplanar CT image reconstructions and MIPs were obtained to evaluate the vascular anatomy.  CONTRAST:  164mL OMNIPAQUE IOHEXOL 350 MG/ML SOLN  COMPARISON:  None.  FINDINGS: Satisfactory opacification of pulmonary arteries noted, and no pulmonary emboli identified. No evidence of thoracic aortic dissection or aneurysm. No evidence of mediastinal hematoma or mass.  Mild cardiomegaly noted. No evidence of pleural or pericardial effusion. No evidence of pulmonary airspace disease or central endobronchial obstruction. No suspicious pulmonary nodules or masses are identified. Mild interstitial prominence noted in the lung bases bilaterally, likely chronic.  Review of the MIP images confirms the above findings.  IMPRESSION: No evidence of pulmonary embolism or other acute findings.  Mild cardiomegaly.   Electronically Signed   By: Earle Gell M.D.   On: 12/17/2013 17:21   Dg Chest Port 1 View  12/17/2013   CLINICAL DATA:  Shortness of breath.  EXAM: PORTABLE CHEST - 1 VIEW  COMPARISON:  None.  FINDINGS: The heart size and mediastinal contours are within  normal limits. Both lungs are clear. The visualized skeletal structures are unremarkable.  IMPRESSION: No active disease.   Electronically Signed   By: Earle Gell M.D.   On: 12/17/2013 11:34    Consultations:  Clinical social work  Antibiotics:  none  Discharge Exam: Filed Vitals:   12/20/13 0350  BP: 142/81  Pulse: 85  Temp: 98.1 F (36.7 C)  Resp: 18   Filed Vitals:   12/19/13 0500 12/19/13 1432 12/19/13 1949 12/20/13 0350  BP:  120/55 157/76 142/81  Pulse:  71 80 85  Temp:  97.7 F (36.5 C) 97.7 F (36.5 C) 98.1 F (36.7 C)  TempSrc:  Oral Oral Oral  Resp:  16 17 18   Height:      Weight: 166 lb 4.8 oz (75.433 kg)   165 lb 9.1 oz (75.1 kg)  SpO2:  97% 97% 95%    General: Pt is alert, follows commands appropriately, not in acute distress Cardiovascular: Regular rate and rhythm, S1/S2, no murmurs  Respiratory: Clear to auscultation bilaterally, no wheezing, no crackles  Abdomen: Soft, non tender, non distended, bowel sounds present  Extremities: 3+ edema to bilateral LE to knees, + red discoloration with sharp demarcation, possibly due to previous stockings.  Neuro: Grossly nonfocal   Discharge Instructions     Medication List    STOP taking these medications       meloxicam 7.5 MG tablet  Commonly known as:  MOBIC      TAKE these medications       acetaminophen 325 MG tablet  Commonly known as:  TYLENOL  Take 325 mg by mouth every 6 (six) hours as needed for mild pain.     aspirin 81 MG chewable tablet  Chew 81 mg by mouth daily.     atorvastatin 20 MG tablet  Commonly known as:  LIPITOR  Take 20 mg by mouth every evening.     azelastine 0.05 % ophthalmic solution  Commonly known as:  OPTIVAR  Place 1 drop into the right eye 2 (two) times daily.     BETOPTIC-S 0.25 % ophthalmic suspension  Generic drug:  betaxolol  Place 1 drop into the right eye daily.     furosemide 40 MG tablet  Commonly known as:  LASIX  Take 1 tablet (40 mg total)  by mouth 2 (two) times daily.     hydrALAZINE 10 MG tablet  Commonly known as:  APRESOLINE  Take 10 mg by mouth 3 (three) times daily.     meclizine 25 MG tablet  Commonly known as:  ANTIVERT  Take 12.5 mg by mouth 2 (two) times daily as needed for dizziness.     naproxen sodium 220 MG tablet  Commonly known as:  ANAPROX  Take 220 mg by mouth every 12 (twelve) hours as needed (for pain).     tamsulosin 0.4 MG Caps capsule  Commonly known as:  FLOMAX  Take 0.4 mg by mouth at bedtime.     traMADol 50 MG tablet  Commonly known as:  ULTRAM  Take 0.5 tablets (25 mg total) by mouth every 12 (twelve) hours as needed for moderate pain.  valsartan 40 MG tablet  Commonly known as:  DIOVAN  Take 20 mg by mouth daily.       Follow-up Information   Follow up with MCGOWEN,PHILIP H, MD. Schedule an appointment as soon as possible for a visit in 1 week. (For repeat labs and follow up. )    Specialty:  Family Medicine   Contact information:   [address] [place] [address] [phone]        The results of significant diagnostics from this hospitalization (including imaging, microbiology, ancillary and laboratory) are listed below for reference.     Microbiology: No results found for this or any previous visit (from the past 240 hour(s)).   Labs: Basic Metabolic Panel:  Recent Labs Lab 12/18/13 0355 12/19/13 0500 12/20/13 0326  NA 137 135* 137  K 5.0 4.2 4.4  CL 101 96 98  CO2 27 27 26   GLUCOSE 112* 105* 113*  BUN 19 20 22   CREATININE 1.20 1.37* 1.28  CALCIUM 8.5 8.7 8.6   Liver Function Tests:  Recent Labs Lab 12/18/13 0355  AST 22  ALT 51  ALKPHOS 97  BILITOT 0.3  PROT 5.3*  ALBUMIN 2.8*   No results found for this basename: LIPASE, AMYLASE,  in the last 168 hours No results found for this basename: AMMONIA,  in the last 168 hours CBC:  Recent Labs Lab 12/18/13 0355 12/19/13 0500  WBC 7.0 7.0  HGB 9.6* 9.8*  HCT 28.1* 28.5*  MCV  90.6 90.5  PLT 275 293   Cardiac Enzymes:  Recent Labs Lab 12/17/13 2252 12/18/13 0355 12/18/13 1029  TROPONINI <0.30 <0.30 <0.30   BNP: BNP (last 3 results)  Recent Labs  12/17/13 1125  PROBNP 338.9   CBG: No results found for this basename: GLUCAP,  in the last 168 hours   SIGNED: Time coordinating discharge: Over 30 minutes  Gilles Chiquito, MD  [facility] 12/24/2013, 2:29 PM Pager [phone]  If 7PM-7AM, please contact night-coverage [email or web address] Password [email or web address]

## 2013-12-20 NOTE — ED Provider Notes (Signed)
Medical screening examination/treatment/procedure(s) were performed by non-physician practitioner and as supervising physician I was immediately available for consultation/collaboration.   EKG Interpretation   Date/Time:  Wednesday December 17 2013 11:48:56 EDT Ventricular Rate:  75 PR Interval:  146 QRS Duration: 91 QT Interval:  380 QTC Calculation: 424 R Axis:   -30 Text Interpretation:  Sinus rhythm Left axis deviation Low voltage,  precordial leads Abnormal R-wave progression, early transition Borderline  repolarization abnormality since last tracing no significant change  Confirmed by Eulis Foster  MD, ELLIOTT ([id number]) on 12/17/2013 11:56:44 AM        Orpah Greek, MD 12/20/13 [phone]

## 2013-12-20 NOTE — Progress Notes (Signed)
[facility] PROGRESS NOTE  Nandan Willems HBZ:[id number] DOB: 1923/03/08 DOA: 12/17/2013 PCP: [name], MD  Brief narrative: Lance Kemp is an 80 yo man with PMH of edema, HTN, CVA, peripheral neuropathy, NSTEMI, HTN who presented with SOB and hypoxia along with worsening edema.  He was given stockings to wear from his PCP and has apparent skin irritation from them.  TTE revealed grade 1 diastolic dysfunction.  He was on IV lasix and has had good urine output.  He was transitioned to PO lasix and is doing well, renal function okay   Hypoxia - resolved  dCHF with venous insufficiency and LE edema - Continues to improve, lungs clear on exam  - Renal function today improved to baseline - Continue lasix 40mg  BID  - TTE as noted above, with grade 1 diastolic dysfunction  HTN (hypertension), benign - Relatively well controlled for his age of 73 - Continue lasix and avapro  BPH associated with nocturia On tamsulosin, continue at discharge  Skin irritation - If not improved with stopping compression stockings, would try a steroid cream for relief at his ALF, can be decided by his PCP  Lumbago - continues with back pain which is chronic and not changed - Walking well with walker.      Procedures/Studies: No results found.    Code Status: DNR Family Communication: Pt at bedside, son Lance Kemp updated yesterday evening by phone Disposition Plan: Back to ALF today  HPI/Subjective: Doing well, walking in hall with walker. No complaints, would like to go home.   Objective: Filed Vitals:   12/19/13 0500 12/19/13 1432 12/19/13 1949 12/20/13 0350  BP:  120/55 157/76 142/81  Pulse:  71 80 85  Temp:  97.7 F (36.5 C) 97.7 F (36.5 C) 98.1 F (36.7 C)  TempSrc:  Oral Oral Oral  Resp:  16 17 18   Height:      Weight: 166 lb 4.8 oz (75.433 kg)   165 lb 9.1 oz (75.1 kg)  SpO2:  97% 97% 95%    Intake/Output Summary (Last 24 hours) at 12/20/13 1017 Last data filed at  12/19/13 2343  Gross per 24 hour  Intake    600 ml  Output   1025 ml  Net   -425 ml    Exam:   General:  Pt is alert,NAD  Cardiovascular: Regular rate and rhythm, S1/S2, no murmurs  Respiratory: CTAB, no crackles  Abdomen: Soft, bowel sounds present  Extremities: 2-3+ edema to bilateral LE to knees, + red discoloration with sharp demarcation at mid calf, possibly due to previous stockings.    Neuro: Grossly nonfocal  Data Reviewed: Basic Metabolic Panel:  Recent Labs Lab 12/17/13 1125 12/18/13 0355 12/19/13 0500 12/20/13 0326  NA 136* 137 135* 137  K 5.2 5.0 4.2 4.4  CL 98 101 96 98  CO2 24 27 27 26   GLUCOSE 110* 112* 105* 113*  BUN 21 19 20 22   CREATININE 1.24 1.20 1.37* 1.28  CALCIUM 9.1 8.5 8.7 8.6   Liver Function Tests:  Recent Labs Lab 12/18/13 0355  AST 22  ALT 51  ALKPHOS 97  BILITOT 0.3  PROT 5.3*  ALBUMIN 2.8*   CBC:  Recent Labs Lab 12/17/13 1125 12/18/13 0355 12/19/13 0500  WBC 7.7 7.0 7.0  HGB 10.8* 9.6* 9.8*  HCT 31.3* 28.1* 28.5*  MCV 91.8 90.6 90.5  PLT 268 275 293   Cardiac Enzymes:  Recent Labs Lab 12/17/13 2252 12/18/13 0355 12/18/13 1029  TROPONINI <0.30 <0.30 <0.30  Scheduled Meds: . acetaminophen  650 mg Oral TID  . aspirin  81 mg Oral Daily  . atorvastatin  20 mg Oral QPM  . betaxolol  1 drop Right Eye Daily  . furosemide  40 mg Oral BID  . heparin  5,000 Units Subcutaneous 3 times per day  . irbesartan  75 mg Oral Daily  . tamsulosin  0.4 mg Oral QHS   Continuous Infusions:    Gilles Chiquito, MD  [place] Pager [phone]  If 7PM-7AM, please contact night-coverage [email or web address] Password [email or web address] 12/20/2013, 7:07 AM   LOS: 3 days

## 2013-12-20 NOTE — Discharge Instructions (Signed)
Lance Kemp, you were admitted for difficulty breathing and swelling in your legs which was worse than before.  You were evaluated for heart disease and were found to not be having a heart attack.  You were also found to have some grade 2 diastolic heart failure, which means that your heart does not relax enough in between beats.  This can explain some of your swelling.  You were put on lasix by vein here in the hospital and your swelling improved somewhat.  You will go home on a higher dose of lasix, 40mg  by mouth twice a day, to help keep your swelling down.  If you should find that your swelling worsens, your primary doctor may increase your dose of lasix.   You should stop taking your mobic and only take the naproxen (aleve) and only as needed.  You had some dysfunction in your kidneys while you win the hospital and those type of medicines can make your kidney disease worse.  Take tramadol for pain.    Please call your doctor or return to the hospital if your swelling gets any worse, you develop shortness of breath again, you develop chest pain, fever, chills, uncontrollable nausea or vomiting or diarrhea or you get dizzy upon standing or pass out.

## 2013-12-24 DIAGNOSIS — M6281 Muscle weakness (generalized): Secondary | ICD-10-CM | POA: Diagnosis not present

## 2013-12-24 DIAGNOSIS — G609 Hereditary and idiopathic neuropathy, unspecified: Secondary | ICD-10-CM | POA: Diagnosis not present

## 2013-12-24 DIAGNOSIS — R279 Unspecified lack of coordination: Secondary | ICD-10-CM | POA: Diagnosis not present

## 2013-12-24 DIAGNOSIS — N183 Chronic kidney disease, stage 3 unspecified: Secondary | ICD-10-CM | POA: Diagnosis not present

## 2013-12-24 DIAGNOSIS — M545 Low back pain, unspecified: Secondary | ICD-10-CM | POA: Diagnosis not present

## 2013-12-24 DIAGNOSIS — I5033 Acute on chronic diastolic (congestive) heart failure: Principal | ICD-10-CM

## 2013-12-24 DIAGNOSIS — I129 Hypertensive chronic kidney disease with stage 1 through stage 4 chronic kidney disease, or unspecified chronic kidney disease: Secondary | ICD-10-CM | POA: Diagnosis not present

## 2013-12-24 DIAGNOSIS — M1990 Unspecified osteoarthritis, unspecified site: Secondary | ICD-10-CM | POA: Diagnosis not present

## 2013-12-24 DIAGNOSIS — I509 Heart failure, unspecified: Secondary | ICD-10-CM | POA: Diagnosis not present

## 2013-12-24 DIAGNOSIS — Z8673 Personal history of transient ischemic attack (TIA), and cerebral infarction without residual deficits: Secondary | ICD-10-CM | POA: Diagnosis not present

## 2013-12-24 DIAGNOSIS — I872 Venous insufficiency (chronic) (peripheral): Secondary | ICD-10-CM | POA: Diagnosis not present

## 2013-12-24 DIAGNOSIS — R262 Difficulty in walking, not elsewhere classified: Secondary | ICD-10-CM | POA: Diagnosis not present

## 2013-12-26 DIAGNOSIS — N183 Chronic kidney disease, stage 3 unspecified: Secondary | ICD-10-CM | POA: Diagnosis not present

## 2013-12-26 DIAGNOSIS — G609 Hereditary and idiopathic neuropathy, unspecified: Secondary | ICD-10-CM | POA: Diagnosis not present

## 2013-12-26 DIAGNOSIS — M545 Low back pain, unspecified: Secondary | ICD-10-CM | POA: Diagnosis not present

## 2013-12-26 DIAGNOSIS — I129 Hypertensive chronic kidney disease with stage 1 through stage 4 chronic kidney disease, or unspecified chronic kidney disease: Secondary | ICD-10-CM | POA: Diagnosis not present

## 2013-12-26 DIAGNOSIS — I509 Heart failure, unspecified: Secondary | ICD-10-CM | POA: Diagnosis not present

## 2013-12-26 DIAGNOSIS — I872 Venous insufficiency (chronic) (peripheral): Secondary | ICD-10-CM | POA: Diagnosis not present

## 2013-12-28 DIAGNOSIS — N183 Chronic kidney disease, stage 3 unspecified: Secondary | ICD-10-CM | POA: Diagnosis not present

## 2013-12-28 DIAGNOSIS — M545 Low back pain, unspecified: Secondary | ICD-10-CM | POA: Diagnosis not present

## 2013-12-28 DIAGNOSIS — I509 Heart failure, unspecified: Secondary | ICD-10-CM | POA: Diagnosis not present

## 2013-12-28 DIAGNOSIS — G609 Hereditary and idiopathic neuropathy, unspecified: Secondary | ICD-10-CM | POA: Diagnosis not present

## 2013-12-28 DIAGNOSIS — I872 Venous insufficiency (chronic) (peripheral): Secondary | ICD-10-CM | POA: Diagnosis not present

## 2013-12-28 DIAGNOSIS — I129 Hypertensive chronic kidney disease with stage 1 through stage 4 chronic kidney disease, or unspecified chronic kidney disease: Secondary | ICD-10-CM | POA: Diagnosis not present

## 2013-12-29 DIAGNOSIS — I129 Hypertensive chronic kidney disease with stage 1 through stage 4 chronic kidney disease, or unspecified chronic kidney disease: Secondary | ICD-10-CM | POA: Diagnosis not present

## 2013-12-29 DIAGNOSIS — N183 Chronic kidney disease, stage 3 unspecified: Secondary | ICD-10-CM | POA: Diagnosis not present

## 2013-12-29 DIAGNOSIS — M545 Low back pain, unspecified: Secondary | ICD-10-CM | POA: Diagnosis not present

## 2013-12-29 DIAGNOSIS — G609 Hereditary and idiopathic neuropathy, unspecified: Secondary | ICD-10-CM | POA: Diagnosis not present

## 2013-12-29 DIAGNOSIS — I509 Heart failure, unspecified: Secondary | ICD-10-CM | POA: Diagnosis not present

## 2013-12-29 DIAGNOSIS — I872 Venous insufficiency (chronic) (peripheral): Secondary | ICD-10-CM | POA: Diagnosis not present

## 2013-12-31 DIAGNOSIS — B379 Candidiasis, unspecified: Secondary | ICD-10-CM | POA: Diagnosis not present

## 2013-12-31 DIAGNOSIS — I872 Venous insufficiency (chronic) (peripheral): Secondary | ICD-10-CM | POA: Diagnosis not present

## 2013-12-31 DIAGNOSIS — C159 Malignant neoplasm of esophagus, unspecified: Secondary | ICD-10-CM | POA: Diagnosis not present

## 2013-12-31 DIAGNOSIS — I129 Hypertensive chronic kidney disease with stage 1 through stage 4 chronic kidney disease, or unspecified chronic kidney disease: Secondary | ICD-10-CM | POA: Diagnosis not present

## 2013-12-31 DIAGNOSIS — I509 Heart failure, unspecified: Secondary | ICD-10-CM | POA: Diagnosis not present

## 2013-12-31 DIAGNOSIS — N183 Chronic kidney disease, stage 3 unspecified: Secondary | ICD-10-CM | POA: Diagnosis not present

## 2013-12-31 DIAGNOSIS — M545 Low back pain, unspecified: Secondary | ICD-10-CM | POA: Diagnosis not present

## 2013-12-31 DIAGNOSIS — G609 Hereditary and idiopathic neuropathy, unspecified: Secondary | ICD-10-CM | POA: Diagnosis not present

## 2014-01-01 DIAGNOSIS — I129 Hypertensive chronic kidney disease with stage 1 through stage 4 chronic kidney disease, or unspecified chronic kidney disease: Secondary | ICD-10-CM | POA: Diagnosis not present

## 2014-01-01 DIAGNOSIS — M545 Low back pain, unspecified: Secondary | ICD-10-CM | POA: Diagnosis not present

## 2014-01-01 DIAGNOSIS — G609 Hereditary and idiopathic neuropathy, unspecified: Secondary | ICD-10-CM | POA: Diagnosis not present

## 2014-01-01 DIAGNOSIS — N183 Chronic kidney disease, stage 3 unspecified: Secondary | ICD-10-CM | POA: Diagnosis not present

## 2014-01-01 DIAGNOSIS — I509 Heart failure, unspecified: Secondary | ICD-10-CM | POA: Diagnosis not present

## 2014-01-01 DIAGNOSIS — I872 Venous insufficiency (chronic) (peripheral): Secondary | ICD-10-CM | POA: Diagnosis not present

## 2014-01-02 ENCOUNTER — Ambulatory Visit (INDEPENDENT_AMBULATORY_CARE_PROVIDER_SITE_OTHER): Payer: Medicare Other | Admitting: Family Medicine

## 2014-01-02 ENCOUNTER — Encounter: Payer: Self-pay | Admitting: Family Medicine

## 2014-01-02 VITALS — BP 152/81 | HR 78 | Temp 97.8°F | Ht 65.0 in | Wt 165.0 lb

## 2014-01-02 DIAGNOSIS — R351 Nocturia: Secondary | ICD-10-CM

## 2014-01-02 DIAGNOSIS — I129 Hypertensive chronic kidney disease with stage 1 through stage 4 chronic kidney disease, or unspecified chronic kidney disease: Secondary | ICD-10-CM | POA: Diagnosis not present

## 2014-01-02 DIAGNOSIS — N138 Other obstructive and reflux uropathy: Secondary | ICD-10-CM | POA: Diagnosis not present

## 2014-01-02 DIAGNOSIS — M545 Low back pain, unspecified: Secondary | ICD-10-CM | POA: Diagnosis not present

## 2014-01-02 DIAGNOSIS — I872 Venous insufficiency (chronic) (peripheral): Secondary | ICD-10-CM | POA: Diagnosis not present

## 2014-01-02 DIAGNOSIS — N401 Enlarged prostate with lower urinary tract symptoms: Secondary | ICD-10-CM

## 2014-01-02 DIAGNOSIS — I5033 Acute on chronic diastolic (congestive) heart failure: Secondary | ICD-10-CM

## 2014-01-02 DIAGNOSIS — G459 Transient cerebral ischemic attack, unspecified: Secondary | ICD-10-CM

## 2014-01-02 DIAGNOSIS — I509 Heart failure, unspecified: Secondary | ICD-10-CM | POA: Diagnosis not present

## 2014-01-02 DIAGNOSIS — N183 Chronic kidney disease, stage 3 unspecified: Secondary | ICD-10-CM | POA: Diagnosis not present

## 2014-01-02 DIAGNOSIS — G609 Hereditary and idiopathic neuropathy, unspecified: Secondary | ICD-10-CM | POA: Diagnosis not present

## 2014-01-02 MED ORDER — TAMSULOSIN HCL 0.4 MG PO CAPS: 0.8000 mg | ORAL_CAPSULE | Freq: Every day | ORAL | Status: DC

## 2014-01-02 NOTE — Progress Notes (Signed)
OFFICE VISIT  01/02/2014   CC:  Chief Complaint  Patient presents with  . Follow-up   HPI:    Patient is a 80 y.o. Caucasian male who presents for f/u BPH, flomax use. Also f/u recent hospitalization 7/1-7/8, 2015 for hypoxia secondary to acute on chronic diastolic heart failure. His NSAID was stopped due to fear of renal dysfunction, lasix increased to 40mg  bid. Needs BMET. (LE venous duplex showed no DVT or SVT 12/17/13.  CT angio showed no PE or other acute findiings on 12/17/13. CXR 12/17/13 showed NAD.)  Sounds like his urinary urgency/frequency/nocturia is largely unchanged despite the increase in his diuretic starting 12/17/13. This makes me think the flomax I rx'd last visit is somewhat helpful.  ROS: no SOB, no chest pain.  +chronic LE swelling unchanged.   Past Medical History  Diagnosis Date  . Osteoarthritis     hands/wrists  . Depression     antidepressant x 1 yr--situational. Resolved 08/2013 per pt/son.  . Glaucoma   . Hypertension   . 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  No significant residual deficit except short term memory impairment  . Urine incontinence   . Frequent headaches   . Chronic venous insufficiency     compression hose  . Peripheral neuropathy 2013    Dx'd by neuro in [place] at the time the patient was hospitalized briefly for [place] CVA.  . NSTEMI (non-ST elevated myocardial infarction) 06/2011    [place]: Troponin mildly elevated, normal EKG, normal ECHO, pt declined cardiology consult--per old PCP records.  . Basal cell carcinoma 2011    [place]  . Chronic renal insufficiency, stage III (moderate) 2013    CrCl in the 50s  . Chronic diastolic heart failure     Past Surgical History  Procedure Laterality Date  . Cholecystectomy    . Tonsilectomy, adenoidectomy, bilateral myringotomy and tubes    . Transthoracic echocardiogram  12/18/13    Grade 1 diast dysf, o/w normal    Outpatient Prescriptions Prior to Visit  Medication Sig  Dispense Refill  . acetaminophen (TYLENOL) 325 MG tablet Take 325 mg by mouth every 6 (six) hours as needed for mild pain.      Marland Kitchen aspirin 81 MG chewable tablet Chew 81 mg by mouth daily.      Marland Kitchen atorvastatin (LIPITOR) 20 MG tablet Take 20 mg by mouth every evening.       Marland Kitchen azelastine (OPTIVAR) 0.05 % ophthalmic solution Place 1 drop into the right eye 2 (two) times daily.       Marland Kitchen BETOPTIC-S 0.25 % ophthalmic suspension Place 1 drop into the right eye daily.       . furosemide (LASIX) 40 MG tablet Take 1 tablet (40 mg total) by mouth 2 (two) times daily.  60 tablet  1  . hydrALAZINE (APRESOLINE) 10 MG tablet Take 10 mg by mouth 3 (three) times daily.       . meclizine (ANTIVERT) 25 MG tablet Take 12.5 mg by mouth 2 (two) times daily as needed for dizziness.      . traMADol (ULTRAM) 50 MG tablet Take 0.5 tablets (25 mg total) by mouth every 12 (twelve) hours as needed for moderate pain.  30 tablet  1  . valsartan (DIOVAN) 40 MG tablet Take 20 mg by mouth daily.       . tamsulosin (FLOMAX) 0.4 MG CAPS capsule Take 0.4 mg by mouth at bedtime.      . naproxen sodium (ANAPROX) 220  MG tablet Take 220 mg by mouth every 12 (twelve) hours as needed (for pain).       No facility-administered medications prior to visit.    Allergies  Allergen Reactions  . Altace [Ramipril] Other (See Comments)    As per old records from [place]  . Amoxicillin Other (See Comments)    Per old records from [place]  . Cozaar [Losartan Potassium] Other (See Comments)    As per old records from [place]    ROS As per HPI  PE: Blood pressure 152/81, pulse 78, temperature 97.8 F (36.6 C), temperature source Temporal, height 5\' 5"  (1.651 m), weight 165 lb (74.844 kg), SpO2 95.00%. Gen: Alert, well appearing.  Patient is oriented to person, place, time, and situation. ZDG:UYQI: no injection, icteris, swelling, or exudate.  EOMI, PERRLA. Mouth: lips without lesion/swelling.  Oral mucosa pink and moist. Oropharynx without  erythema, exudate, or swelling.  CV: RRR, no m/r/g LUNGS: bibasilar soft, early insp crackles with decent aeration, no prolongation of exp phase and no wheezing.  No post exhalation coughing.  Breathing is nonlabored. EXT: 3+ bilat LE pitting edema  LABS:    Chemistry      Component Value Date/Time   NA 137 12/20/2013 0326   K 4.4 12/20/2013 0326   CL 98 12/20/2013 0326   CO2 26 12/20/2013 0326   BUN 22 12/20/2013 0326   CREATININE 1.28 12/20/2013 0326      Component Value Date/Time   CALCIUM 8.6 12/20/2013 0326   ALKPHOS 97 12/18/2013 0355   AST 22 12/18/2013 0355   ALT 51 12/18/2013 0355   BILITOT 0.3 12/18/2013 0355     Lab Results  Component Value Date   WBC 7.0 12/19/2013   HGB 9.8* 12/19/2013   HCT 28.5* 12/19/2013   MCV 90.5 12/19/2013   PLT 293 12/19/2013    IMPRESSION AND PLAN:  1) Chronic diastolic HF with peripheral edema secondary to this as well as LE venous insufficiency:  Continue 40mg  bid lasix for now, recheck BMET and adjust lasix if necessary. Restart compression stockings daily.  2) BPH: slightly improved on 0.4mg  flomax.  Increase flomax to 0.8mg  qhs.  3) Normocytic anemia: unfortunately I did not order recheck of this today, and no w/u was done in hosp. Will try to add CBC and iron studies onto today's labs.  Hb was 11.2 and MCV was 92 back in March this year.  An After Visit Summary was printed and given to the patient.  FOLLOW UP: Return in about 1 month (around 02/02/2014) for routine chronic illness f/u.

## 2014-01-02 NOTE — Progress Notes (Signed)
Pre visit review using our clinic review tool, if applicable. No additional management support is needed unless otherwise documented below in the visit note. 

## 2014-01-05 ENCOUNTER — Ambulatory Visit (INDEPENDENT_AMBULATORY_CARE_PROVIDER_SITE_OTHER): Payer: Medicare Other | Admitting: Ophthalmology

## 2014-01-05 ENCOUNTER — Telehealth: Payer: Self-pay | Admitting: Family Medicine

## 2014-01-05 DIAGNOSIS — I509 Heart failure, unspecified: Secondary | ICD-10-CM | POA: Diagnosis not present

## 2014-01-05 DIAGNOSIS — M545 Low back pain, unspecified: Secondary | ICD-10-CM | POA: Diagnosis not present

## 2014-01-05 DIAGNOSIS — N183 Chronic kidney disease, stage 3 unspecified: Secondary | ICD-10-CM | POA: Diagnosis not present

## 2014-01-05 DIAGNOSIS — I872 Venous insufficiency (chronic) (peripheral): Secondary | ICD-10-CM | POA: Diagnosis not present

## 2014-01-05 DIAGNOSIS — I129 Hypertensive chronic kidney disease with stage 1 through stage 4 chronic kidney disease, or unspecified chronic kidney disease: Secondary | ICD-10-CM | POA: Diagnosis not present

## 2014-01-05 DIAGNOSIS — G609 Hereditary and idiopathic neuropathy, unspecified: Secondary | ICD-10-CM | POA: Diagnosis not present

## 2014-01-05 NOTE — Telephone Encounter (Signed)
Can you see where the labs are that I ordered on him 01/02/14?-thx

## 2014-01-07 DIAGNOSIS — M545 Low back pain, unspecified: Secondary | ICD-10-CM | POA: Diagnosis not present

## 2014-01-07 DIAGNOSIS — I872 Venous insufficiency (chronic) (peripheral): Secondary | ICD-10-CM | POA: Diagnosis not present

## 2014-01-07 DIAGNOSIS — N183 Chronic kidney disease, stage 3 unspecified: Secondary | ICD-10-CM | POA: Diagnosis not present

## 2014-01-07 DIAGNOSIS — I129 Hypertensive chronic kidney disease with stage 1 through stage 4 chronic kidney disease, or unspecified chronic kidney disease: Secondary | ICD-10-CM | POA: Diagnosis not present

## 2014-01-07 DIAGNOSIS — G609 Hereditary and idiopathic neuropathy, unspecified: Secondary | ICD-10-CM | POA: Diagnosis not present

## 2014-01-07 DIAGNOSIS — I509 Heart failure, unspecified: Secondary | ICD-10-CM | POA: Diagnosis not present

## 2014-01-08 ENCOUNTER — Ambulatory Visit (INDEPENDENT_AMBULATORY_CARE_PROVIDER_SITE_OTHER): Payer: Medicare Other | Admitting: Ophthalmology

## 2014-01-08 DIAGNOSIS — H43819 Vitreous degeneration, unspecified eye: Secondary | ICD-10-CM

## 2014-01-08 DIAGNOSIS — H35039 Hypertensive retinopathy, unspecified eye: Secondary | ICD-10-CM | POA: Diagnosis not present

## 2014-01-08 DIAGNOSIS — I10 Essential (primary) hypertension: Secondary | ICD-10-CM

## 2014-01-08 DIAGNOSIS — H3530 Unspecified macular degeneration: Secondary | ICD-10-CM | POA: Diagnosis not present

## 2014-01-08 NOTE — Telephone Encounter (Signed)
OK.  Pls order CBC w/diff, IBC panel, and ferritin.  Dx is normocytic anemia.-thx

## 2014-01-08 NOTE — Telephone Encounter (Signed)
Spoke with Gerald Stabs, he will bring pt in tomorrow or Monday. FYI Dr. Anitra Lauth.

## 2014-01-08 NOTE — Telephone Encounter (Signed)
The iron orders are in addition to the previously ordered labs.-thx

## 2014-01-09 DIAGNOSIS — G609 Hereditary and idiopathic neuropathy, unspecified: Secondary | ICD-10-CM | POA: Diagnosis not present

## 2014-01-09 DIAGNOSIS — I509 Heart failure, unspecified: Secondary | ICD-10-CM | POA: Diagnosis not present

## 2014-01-09 DIAGNOSIS — M545 Low back pain, unspecified: Secondary | ICD-10-CM | POA: Diagnosis not present

## 2014-01-09 DIAGNOSIS — N183 Chronic kidney disease, stage 3 unspecified: Secondary | ICD-10-CM | POA: Diagnosis not present

## 2014-01-09 DIAGNOSIS — I129 Hypertensive chronic kidney disease with stage 1 through stage 4 chronic kidney disease, or unspecified chronic kidney disease: Secondary | ICD-10-CM | POA: Diagnosis not present

## 2014-01-09 DIAGNOSIS — I872 Venous insufficiency (chronic) (peripheral): Secondary | ICD-10-CM | POA: Diagnosis not present

## 2014-01-12 DIAGNOSIS — M545 Low back pain, unspecified: Secondary | ICD-10-CM | POA: Diagnosis not present

## 2014-01-12 DIAGNOSIS — I129 Hypertensive chronic kidney disease with stage 1 through stage 4 chronic kidney disease, or unspecified chronic kidney disease: Secondary | ICD-10-CM | POA: Diagnosis not present

## 2014-01-12 DIAGNOSIS — I509 Heart failure, unspecified: Secondary | ICD-10-CM | POA: Diagnosis not present

## 2014-01-12 DIAGNOSIS — N183 Chronic kidney disease, stage 3 unspecified: Secondary | ICD-10-CM | POA: Diagnosis not present

## 2014-01-12 DIAGNOSIS — I872 Venous insufficiency (chronic) (peripheral): Secondary | ICD-10-CM | POA: Diagnosis not present

## 2014-01-12 DIAGNOSIS — G609 Hereditary and idiopathic neuropathy, unspecified: Secondary | ICD-10-CM | POA: Diagnosis not present

## 2014-01-14 ENCOUNTER — Other Ambulatory Visit: Payer: Self-pay | Admitting: Family Medicine

## 2014-01-14 MED ORDER — TEMAZEPAM 7.5 MG PO CAPS: 7.5000 mg | ORAL_CAPSULE | Freq: Every evening | ORAL | Status: DC | PRN

## 2014-01-15 DIAGNOSIS — M545 Low back pain, unspecified: Secondary | ICD-10-CM | POA: Diagnosis not present

## 2014-01-15 DIAGNOSIS — I872 Venous insufficiency (chronic) (peripheral): Secondary | ICD-10-CM | POA: Diagnosis not present

## 2014-01-15 DIAGNOSIS — I509 Heart failure, unspecified: Secondary | ICD-10-CM | POA: Diagnosis not present

## 2014-01-15 DIAGNOSIS — G609 Hereditary and idiopathic neuropathy, unspecified: Secondary | ICD-10-CM | POA: Diagnosis not present

## 2014-01-15 DIAGNOSIS — N183 Chronic kidney disease, stage 3 unspecified: Secondary | ICD-10-CM | POA: Diagnosis not present

## 2014-01-15 DIAGNOSIS — I129 Hypertensive chronic kidney disease with stage 1 through stage 4 chronic kidney disease, or unspecified chronic kidney disease: Secondary | ICD-10-CM | POA: Diagnosis not present

## 2014-01-16 ENCOUNTER — Other Ambulatory Visit: Payer: Medicare Other

## 2014-01-16 DIAGNOSIS — I872 Venous insufficiency (chronic) (peripheral): Secondary | ICD-10-CM | POA: Diagnosis not present

## 2014-01-16 DIAGNOSIS — I509 Heart failure, unspecified: Secondary | ICD-10-CM | POA: Diagnosis not present

## 2014-01-16 DIAGNOSIS — G609 Hereditary and idiopathic neuropathy, unspecified: Secondary | ICD-10-CM | POA: Diagnosis not present

## 2014-01-16 DIAGNOSIS — N183 Chronic kidney disease, stage 3 unspecified: Secondary | ICD-10-CM | POA: Diagnosis not present

## 2014-01-16 DIAGNOSIS — D649 Anemia, unspecified: Secondary | ICD-10-CM | POA: Diagnosis not present

## 2014-01-16 DIAGNOSIS — M545 Low back pain, unspecified: Secondary | ICD-10-CM | POA: Diagnosis not present

## 2014-01-16 DIAGNOSIS — I129 Hypertensive chronic kidney disease with stage 1 through stage 4 chronic kidney disease, or unspecified chronic kidney disease: Secondary | ICD-10-CM | POA: Diagnosis not present

## 2014-01-16 LAB — CBC WITH DIFFERENTIAL/PLATELET
BASOS ABS: 0.1 10*3/uL (ref 0.0–0.1)
BASOS PCT: 1 % (ref 0–1)
EOS PCT: 6 % — AB (ref 0–5)
Eosinophils Absolute: 0.5 10*3/uL (ref 0.0–0.7)
HEMATOCRIT: 30.5 % — AB (ref 39.0–52.0)
Hemoglobin: 10.2 g/dL — ABNORMAL LOW (ref 13.0–17.0)
LYMPHS PCT: 18 % (ref 12–46)
Lymphs Abs: 1.4 10*3/uL (ref 0.7–4.0)
MCH: 30.3 pg (ref 26.0–34.0)
MCHC: 33.4 g/dL (ref 30.0–36.0)
MCV: 90.5 fL (ref 78.0–100.0)
MONO ABS: 0.9 10*3/uL (ref 0.1–1.0)
Monocytes Relative: 12 % (ref 3–12)
Neutro Abs: 4.9 10*3/uL (ref 1.7–7.7)
Neutrophils Relative %: 63 % (ref 43–77)
PLATELETS: 279 10*3/uL (ref 150–400)
RBC: 3.37 MIL/uL — ABNORMAL LOW (ref 4.22–5.81)
RDW: 13.3 % (ref 11.5–15.5)
WBC: 7.8 10*3/uL (ref 4.0–10.5)

## 2014-01-16 LAB — FERRITIN: Ferritin: 205 ng/mL (ref 22–322)

## 2014-01-17 LAB — IBC PANEL
%SAT: 14 % — AB (ref 20–55)
TIBC: 237 ug/dL (ref 215–435)
UIBC: 203 ug/dL (ref 125–400)

## 2014-01-17 LAB — BASIC METABOLIC PANEL
BUN: 31 mg/dL — AB (ref 6–23)
CO2: 29 meq/L (ref 19–32)
Calcium: 8.9 mg/dL (ref 8.4–10.5)
Chloride: 103 mEq/L (ref 96–112)
Creat: 1.44 mg/dL — ABNORMAL HIGH (ref 0.50–1.35)
GLUCOSE: 147 mg/dL — AB (ref 70–99)
POTASSIUM: 3.9 meq/L (ref 3.5–5.3)
Sodium: 142 mEq/L (ref 135–145)

## 2014-01-17 LAB — IRON: Iron: 34 ug/dL — ABNORMAL LOW (ref 42–165)

## 2014-01-19 DIAGNOSIS — N183 Chronic kidney disease, stage 3 unspecified: Secondary | ICD-10-CM | POA: Diagnosis not present

## 2014-01-19 DIAGNOSIS — M545 Low back pain, unspecified: Secondary | ICD-10-CM | POA: Diagnosis not present

## 2014-01-19 DIAGNOSIS — I509 Heart failure, unspecified: Secondary | ICD-10-CM | POA: Diagnosis not present

## 2014-01-19 DIAGNOSIS — G609 Hereditary and idiopathic neuropathy, unspecified: Secondary | ICD-10-CM | POA: Diagnosis not present

## 2014-01-19 DIAGNOSIS — I872 Venous insufficiency (chronic) (peripheral): Secondary | ICD-10-CM | POA: Diagnosis not present

## 2014-01-19 DIAGNOSIS — I129 Hypertensive chronic kidney disease with stage 1 through stage 4 chronic kidney disease, or unspecified chronic kidney disease: Secondary | ICD-10-CM | POA: Diagnosis not present

## 2014-01-21 DIAGNOSIS — I129 Hypertensive chronic kidney disease with stage 1 through stage 4 chronic kidney disease, or unspecified chronic kidney disease: Secondary | ICD-10-CM | POA: Diagnosis not present

## 2014-01-21 DIAGNOSIS — N183 Chronic kidney disease, stage 3 unspecified: Secondary | ICD-10-CM | POA: Diagnosis not present

## 2014-01-21 DIAGNOSIS — G609 Hereditary and idiopathic neuropathy, unspecified: Secondary | ICD-10-CM | POA: Diagnosis not present

## 2014-01-21 DIAGNOSIS — M545 Low back pain, unspecified: Secondary | ICD-10-CM | POA: Diagnosis not present

## 2014-01-21 DIAGNOSIS — M67919 Unspecified disorder of synovium and tendon, unspecified shoulder: Secondary | ICD-10-CM | POA: Diagnosis not present

## 2014-01-21 DIAGNOSIS — I872 Venous insufficiency (chronic) (peripheral): Secondary | ICD-10-CM | POA: Diagnosis not present

## 2014-01-21 DIAGNOSIS — M719 Bursopathy, unspecified: Secondary | ICD-10-CM | POA: Diagnosis not present

## 2014-01-21 DIAGNOSIS — I509 Heart failure, unspecified: Secondary | ICD-10-CM | POA: Diagnosis not present

## 2014-01-23 DIAGNOSIS — M545 Low back pain, unspecified: Secondary | ICD-10-CM | POA: Diagnosis not present

## 2014-01-23 DIAGNOSIS — G609 Hereditary and idiopathic neuropathy, unspecified: Secondary | ICD-10-CM | POA: Diagnosis not present

## 2014-01-23 DIAGNOSIS — I872 Venous insufficiency (chronic) (peripheral): Secondary | ICD-10-CM | POA: Diagnosis not present

## 2014-01-23 DIAGNOSIS — N183 Chronic kidney disease, stage 3 unspecified: Secondary | ICD-10-CM | POA: Diagnosis not present

## 2014-01-23 DIAGNOSIS — I509 Heart failure, unspecified: Secondary | ICD-10-CM | POA: Diagnosis not present

## 2014-01-23 DIAGNOSIS — I129 Hypertensive chronic kidney disease with stage 1 through stage 4 chronic kidney disease, or unspecified chronic kidney disease: Secondary | ICD-10-CM | POA: Diagnosis not present

## 2014-01-26 DIAGNOSIS — I129 Hypertensive chronic kidney disease with stage 1 through stage 4 chronic kidney disease, or unspecified chronic kidney disease: Secondary | ICD-10-CM | POA: Diagnosis not present

## 2014-01-26 DIAGNOSIS — G609 Hereditary and idiopathic neuropathy, unspecified: Secondary | ICD-10-CM | POA: Diagnosis not present

## 2014-01-26 DIAGNOSIS — M545 Low back pain, unspecified: Secondary | ICD-10-CM | POA: Diagnosis not present

## 2014-01-26 DIAGNOSIS — I509 Heart failure, unspecified: Secondary | ICD-10-CM | POA: Diagnosis not present

## 2014-01-26 DIAGNOSIS — N183 Chronic kidney disease, stage 3 unspecified: Secondary | ICD-10-CM | POA: Diagnosis not present

## 2014-01-26 DIAGNOSIS — I872 Venous insufficiency (chronic) (peripheral): Secondary | ICD-10-CM | POA: Diagnosis not present

## 2014-01-26 NOTE — Progress Notes (Signed)
Rx is on your keyboard.-thx

## 2014-01-28 ENCOUNTER — Ambulatory Visit (INDEPENDENT_AMBULATORY_CARE_PROVIDER_SITE_OTHER): Payer: Medicare Other | Admitting: Family Medicine

## 2014-01-28 ENCOUNTER — Encounter: Payer: Self-pay | Admitting: Family Medicine

## 2014-01-28 VITALS — BP 127/58 | HR 78 | Temp 97.2°F | Resp 18 | Ht 65.0 in | Wt 166.0 lb

## 2014-01-28 DIAGNOSIS — G8929 Other chronic pain: Secondary | ICD-10-CM | POA: Diagnosis not present

## 2014-01-28 DIAGNOSIS — I5033 Acute on chronic diastolic (congestive) heart failure: Secondary | ICD-10-CM

## 2014-01-28 DIAGNOSIS — N401 Enlarged prostate with lower urinary tract symptoms: Secondary | ICD-10-CM | POA: Diagnosis not present

## 2014-01-28 DIAGNOSIS — I129 Hypertensive chronic kidney disease with stage 1 through stage 4 chronic kidney disease, or unspecified chronic kidney disease: Secondary | ICD-10-CM | POA: Diagnosis not present

## 2014-01-28 DIAGNOSIS — N138 Other obstructive and reflux uropathy: Secondary | ICD-10-CM | POA: Diagnosis not present

## 2014-01-28 DIAGNOSIS — Z23 Encounter for immunization: Secondary | ICD-10-CM | POA: Diagnosis not present

## 2014-01-28 DIAGNOSIS — M545 Low back pain, unspecified: Secondary | ICD-10-CM | POA: Diagnosis not present

## 2014-01-28 DIAGNOSIS — I872 Venous insufficiency (chronic) (peripheral): Secondary | ICD-10-CM | POA: Diagnosis not present

## 2014-01-28 DIAGNOSIS — G609 Hereditary and idiopathic neuropathy, unspecified: Secondary | ICD-10-CM | POA: Diagnosis not present

## 2014-01-28 DIAGNOSIS — I10 Essential (primary) hypertension: Secondary | ICD-10-CM

## 2014-01-28 DIAGNOSIS — R351 Nocturia: Secondary | ICD-10-CM

## 2014-01-28 DIAGNOSIS — M25519 Pain in unspecified shoulder: Secondary | ICD-10-CM

## 2014-01-28 DIAGNOSIS — I509 Heart failure, unspecified: Secondary | ICD-10-CM | POA: Diagnosis not present

## 2014-01-28 DIAGNOSIS — N183 Chronic kidney disease, stage 3 unspecified: Secondary | ICD-10-CM | POA: Diagnosis not present

## 2014-01-28 DIAGNOSIS — M25512 Pain in left shoulder: Principal | ICD-10-CM

## 2014-01-28 MED ORDER — FINASTERIDE 5 MG PO TABS: 5.0000 mg | ORAL_TABLET | Freq: Every day | ORAL | Status: DC

## 2014-01-28 NOTE — Assessment & Plan Note (Signed)
The current medical regimen is effective;  continue present plan and medications.  

## 2014-01-28 NOTE — Progress Notes (Signed)
Pre visit review using our clinic review tool, if applicable. No additional management support is needed unless otherwise documented below in the visit note. 

## 2014-01-28 NOTE — Assessment & Plan Note (Signed)
Stable, no sign of fluid overload. Continue lasix at 40mg  qd.

## 2014-01-28 NOTE — Assessment & Plan Note (Signed)
Continue flomax 0.8mg  qhs. Add finasteride 5mg  qd.

## 2014-01-28 NOTE — Progress Notes (Signed)
OFFICE NOTE  01/28/2014  CC:  Chief Complaint  Patient presents with  . Follow-up   HPI: Patient is a 80 y.o. Caucasian male who is here for 1 mo f/u BPH with nocturia, insomnia, and chronic diastolic HF with peripheral edema secondary to this + LE venous insufficiency.    Last visit we increased his flomax to 0.8mg  qhs. Recently I decreased his lasix to 40mg  qd b/c Cr bumped a little.  Gets up 3-4 times per night to urinate still. Has seen [place] [facility] recently and the plan is to see Dr. Nelva Bush soon to look into possible further things to do for his LB. They have nothing further to offer for his left shoulder--they injected it twice with steroid and it was not helpful.  They recently gave him some oral prednisone and he just started this today.  Also baclofen.  Still with LE swelling, some shooting pains L>R.  ROS: more sadness/depressed mood lately, hopeless feeling sometimes.  No crying spells.   Pertinent PMH:  Past medical, surgical, social, and family history reviewed and no changes are noted since last office visit.  MEDS:  Outpatient Prescriptions Prior to Visit  Medication Sig Dispense Refill  . acetaminophen (TYLENOL) 325 MG tablet Take 325 mg by mouth every 6 (six) hours as needed for mild pain.      Marland Kitchen aspirin 81 MG chewable tablet Chew 81 mg by mouth daily.      Marland Kitchen atorvastatin (LIPITOR) 20 MG tablet Take 20 mg by mouth every evening.       Marland Kitchen azelastine (OPTIVAR) 0.05 % ophthalmic solution Place 1 drop into the right eye 2 (two) times daily.       Marland Kitchen BETOPTIC-S 0.25 % ophthalmic suspension Place 1 drop into the right eye daily.       . furosemide (LASIX) 40 MG tablet Take 1 tablet (40 mg total) by mouth 2 (two) times daily.  60 tablet  1  . hydrALAZINE (APRESOLINE) 10 MG tablet Take 10 mg by mouth 3 (three) times daily.       . meclizine (ANTIVERT) 25 MG tablet Take 12.5 mg by mouth 2 (two) times daily as needed for dizziness.      . tamsulosin (FLOMAX) 0.4 MG CAPS capsule  Take 2 capsules (0.8 mg total) by mouth daily after supper.  60 capsule  6  . temazepam (RESTORIL) 7.5 MG capsule Take 1 capsule (7.5 mg total) by mouth at bedtime as needed for sleep.  30 capsule  5  . traMADol (ULTRAM) 50 MG tablet Take 0.5 tablets (25 mg total) by mouth every 12 (twelve) hours as needed for moderate pain.  30 tablet  1  . valsartan (DIOVAN) 40 MG tablet Take 20 mg by mouth daily.       . naproxen sodium (ANAPROX) 220 MG tablet Take 220 mg by mouth every 12 (twelve) hours as needed (for pain).       No facility-administered medications prior to visit.    PE: Blood pressure 127/58, pulse 78, temperature 97.2 F (36.2 C), temperature source Temporal, resp. rate 18, height 5\' 5"  (1.651 m), weight 166 lb (75.297 kg), SpO2 95.00%. Gen: alert, NAD. AFFECT: pleasant, lucid thought and speech. PERRLA, EOMI. Oral mucosa pink, moist.  Tongue midline.  Throat normal. CV: RRR, distant S1 and S2.  No audible m/r/g. LUNGS: bibasilar soft, early insp crackles, R>L EXT: 2-3+ bilat pitting edema in LL's.  Compression hose are on.  IMPRESSION AND PLAN: BPH associated with nocturia  Continue flomax 0.8mg  qhs. Add finasteride 5mg  qd.  Acute on chronic diastolic heart failure Stable, no sign of fluid overload. Continue lasix at 40mg  qd.  HTN (hypertension), benign The current medical regimen is effective;  continue present plan and medications.   Chronic low back pain + chronic left shoulder pain: continue with ortho rec's: prednisone and baclofen for now, to see Dr. Nelva Bush soon.  Depression: he declines restart of antidepressant at this time, says "it's not that bad".  He'll call or return if he changes his mind.  An After Visit Summary was printed and given to the patient.  FOLLOW UP: 1 mo, need to recheck BMET at that time.

## 2014-01-29 ENCOUNTER — Ambulatory Visit: Payer: Medicare Other | Admitting: Family Medicine

## 2014-01-30 DIAGNOSIS — M545 Low back pain, unspecified: Secondary | ICD-10-CM | POA: Diagnosis not present

## 2014-01-30 DIAGNOSIS — N183 Chronic kidney disease, stage 3 unspecified: Secondary | ICD-10-CM | POA: Diagnosis not present

## 2014-01-30 DIAGNOSIS — I872 Venous insufficiency (chronic) (peripheral): Secondary | ICD-10-CM | POA: Diagnosis not present

## 2014-01-30 DIAGNOSIS — I129 Hypertensive chronic kidney disease with stage 1 through stage 4 chronic kidney disease, or unspecified chronic kidney disease: Secondary | ICD-10-CM | POA: Diagnosis not present

## 2014-01-30 DIAGNOSIS — I509 Heart failure, unspecified: Secondary | ICD-10-CM | POA: Diagnosis not present

## 2014-01-30 DIAGNOSIS — G609 Hereditary and idiopathic neuropathy, unspecified: Secondary | ICD-10-CM | POA: Diagnosis not present

## 2014-02-02 DIAGNOSIS — I129 Hypertensive chronic kidney disease with stage 1 through stage 4 chronic kidney disease, or unspecified chronic kidney disease: Secondary | ICD-10-CM | POA: Diagnosis not present

## 2014-02-02 DIAGNOSIS — M545 Low back pain, unspecified: Secondary | ICD-10-CM | POA: Diagnosis not present

## 2014-02-02 DIAGNOSIS — I872 Venous insufficiency (chronic) (peripheral): Secondary | ICD-10-CM | POA: Diagnosis not present

## 2014-02-02 DIAGNOSIS — G609 Hereditary and idiopathic neuropathy, unspecified: Secondary | ICD-10-CM | POA: Diagnosis not present

## 2014-02-02 DIAGNOSIS — I509 Heart failure, unspecified: Secondary | ICD-10-CM | POA: Diagnosis not present

## 2014-02-02 DIAGNOSIS — N183 Chronic kidney disease, stage 3 unspecified: Secondary | ICD-10-CM | POA: Diagnosis not present

## 2014-02-06 DIAGNOSIS — I129 Hypertensive chronic kidney disease with stage 1 through stage 4 chronic kidney disease, or unspecified chronic kidney disease: Secondary | ICD-10-CM | POA: Diagnosis not present

## 2014-02-06 DIAGNOSIS — M545 Low back pain, unspecified: Secondary | ICD-10-CM | POA: Diagnosis not present

## 2014-02-06 DIAGNOSIS — G609 Hereditary and idiopathic neuropathy, unspecified: Secondary | ICD-10-CM | POA: Diagnosis not present

## 2014-02-06 DIAGNOSIS — I509 Heart failure, unspecified: Secondary | ICD-10-CM | POA: Diagnosis not present

## 2014-02-06 DIAGNOSIS — N183 Chronic kidney disease, stage 3 unspecified: Secondary | ICD-10-CM | POA: Diagnosis not present

## 2014-02-06 DIAGNOSIS — I872 Venous insufficiency (chronic) (peripheral): Secondary | ICD-10-CM | POA: Diagnosis not present

## 2014-02-11 DIAGNOSIS — I509 Heart failure, unspecified: Secondary | ICD-10-CM | POA: Diagnosis not present

## 2014-02-11 DIAGNOSIS — G609 Hereditary and idiopathic neuropathy, unspecified: Secondary | ICD-10-CM | POA: Diagnosis not present

## 2014-02-11 DIAGNOSIS — N183 Chronic kidney disease, stage 3 unspecified: Secondary | ICD-10-CM | POA: Diagnosis not present

## 2014-02-11 DIAGNOSIS — M545 Low back pain, unspecified: Secondary | ICD-10-CM | POA: Diagnosis not present

## 2014-02-11 DIAGNOSIS — I872 Venous insufficiency (chronic) (peripheral): Secondary | ICD-10-CM | POA: Diagnosis not present

## 2014-02-11 DIAGNOSIS — M719 Bursopathy, unspecified: Secondary | ICD-10-CM | POA: Diagnosis not present

## 2014-02-11 DIAGNOSIS — I129 Hypertensive chronic kidney disease with stage 1 through stage 4 chronic kidney disease, or unspecified chronic kidney disease: Secondary | ICD-10-CM | POA: Diagnosis not present

## 2014-02-11 DIAGNOSIS — M67919 Unspecified disorder of synovium and tendon, unspecified shoulder: Secondary | ICD-10-CM | POA: Diagnosis not present

## 2014-02-18 ENCOUNTER — Inpatient Hospital Stay (HOSPITAL_COMMUNITY)
Admission: EM | Admit: 2014-02-18 | Discharge: 2014-02-20 | DRG: 293 | Disposition: A | Discharge status: Home Health | Payer: Medicare Other | Attending: Internal Medicine | Admitting: Internal Medicine

## 2014-02-18 ENCOUNTER — Emergency Department (HOSPITAL_COMMUNITY): Payer: Medicare Other

## 2014-02-18 ENCOUNTER — Encounter (HOSPITAL_COMMUNITY): Payer: Self-pay | Admitting: Emergency Medicine

## 2014-02-18 DIAGNOSIS — Z7982 Long term (current) use of aspirin: Secondary | ICD-10-CM | POA: Diagnosis not present

## 2014-02-18 DIAGNOSIS — M25519 Pain in unspecified shoulder: Secondary | ICD-10-CM | POA: Diagnosis present

## 2014-02-18 DIAGNOSIS — Z8673 Personal history of transient ischemic attack (TIA), and cerebral infarction without residual deficits: Secondary | ICD-10-CM

## 2014-02-18 DIAGNOSIS — R0602 Shortness of breath: Secondary | ICD-10-CM | POA: Diagnosis not present

## 2014-02-18 DIAGNOSIS — I5033 Acute on chronic diastolic (congestive) heart failure: Principal | ICD-10-CM | POA: Diagnosis present

## 2014-02-18 DIAGNOSIS — N183 Chronic kidney disease, stage 3 unspecified: Secondary | ICD-10-CM | POA: Diagnosis present

## 2014-02-18 DIAGNOSIS — R51 Headache: Secondary | ICD-10-CM | POA: Diagnosis not present

## 2014-02-18 DIAGNOSIS — I700 Atherosclerosis of aorta: Secondary | ICD-10-CM | POA: Diagnosis not present

## 2014-02-18 DIAGNOSIS — I10 Essential (primary) hypertension: Secondary | ICD-10-CM | POA: Diagnosis present

## 2014-02-18 DIAGNOSIS — R413 Other amnesia: Secondary | ICD-10-CM

## 2014-02-18 DIAGNOSIS — I509 Heart failure, unspecified: Secondary | ICD-10-CM | POA: Diagnosis present

## 2014-02-18 DIAGNOSIS — N400 Enlarged prostate without lower urinary tract symptoms: Secondary | ICD-10-CM | POA: Diagnosis present

## 2014-02-18 DIAGNOSIS — G609 Hereditary and idiopathic neuropathy, unspecified: Secondary | ICD-10-CM | POA: Diagnosis present

## 2014-02-18 DIAGNOSIS — G8929 Other chronic pain: Secondary | ICD-10-CM | POA: Diagnosis present

## 2014-02-18 DIAGNOSIS — R238 Other skin changes: Secondary | ICD-10-CM

## 2014-02-18 DIAGNOSIS — H409 Unspecified glaucoma: Secondary | ICD-10-CM | POA: Diagnosis present

## 2014-02-18 DIAGNOSIS — I872 Venous insufficiency (chronic) (peripheral): Secondary | ICD-10-CM

## 2014-02-18 DIAGNOSIS — R0902 Hypoxemia: Secondary | ICD-10-CM | POA: Diagnosis present

## 2014-02-18 DIAGNOSIS — D631 Anemia in chronic kidney disease: Secondary | ICD-10-CM | POA: Diagnosis present

## 2014-02-18 DIAGNOSIS — N401 Enlarged prostate with lower urinary tract symptoms: Secondary | ICD-10-CM | POA: Diagnosis present

## 2014-02-18 DIAGNOSIS — R0609 Other forms of dyspnea: Secondary | ICD-10-CM

## 2014-02-18 DIAGNOSIS — Z85828 Personal history of other malignant neoplasm of skin: Secondary | ICD-10-CM

## 2014-02-18 DIAGNOSIS — M549 Dorsalgia, unspecified: Secondary | ICD-10-CM | POA: Diagnosis not present

## 2014-02-18 DIAGNOSIS — I129 Hypertensive chronic kidney disease with stage 1 through stage 4 chronic kidney disease, or unspecified chronic kidney disease: Secondary | ICD-10-CM | POA: Diagnosis present

## 2014-02-18 DIAGNOSIS — M19039 Primary osteoarthritis, unspecified wrist: Secondary | ICD-10-CM | POA: Diagnosis present

## 2014-02-18 DIAGNOSIS — M25512 Pain in left shoulder: Secondary | ICD-10-CM

## 2014-02-18 DIAGNOSIS — Z87891 Personal history of nicotine dependence: Secondary | ICD-10-CM | POA: Diagnosis not present

## 2014-02-18 DIAGNOSIS — I252 Old myocardial infarction: Secondary | ICD-10-CM

## 2014-02-18 DIAGNOSIS — R918 Other nonspecific abnormal finding of lung field: Secondary | ICD-10-CM | POA: Diagnosis not present

## 2014-02-18 DIAGNOSIS — N039 Chronic nephritic syndrome with unspecified morphologic changes: Secondary | ICD-10-CM

## 2014-02-18 DIAGNOSIS — G8194 Hemiplegia, unspecified affecting left nondominant side: Secondary | ICD-10-CM

## 2014-02-18 DIAGNOSIS — R351 Nocturia: Secondary | ICD-10-CM

## 2014-02-18 LAB — BASIC METABOLIC PANEL
Anion gap: 12 (ref 5–15)
BUN: 33 mg/dL — AB (ref 6–23)
CALCIUM: 9.1 mg/dL (ref 8.4–10.5)
CO2: 23 meq/L (ref 19–32)
Chloride: 105 mEq/L (ref 96–112)
Creatinine, Ser: 1.27 mg/dL (ref 0.50–1.35)
GFR calc Af Amer: 56 mL/min — ABNORMAL LOW (ref >90)
GFR, EST NON AFRICAN AMERICAN: 48 mL/min — AB (ref >90)
Glucose, Bld: 111 mg/dL — ABNORMAL HIGH (ref 70–99)
Potassium: 4.4 mEq/L (ref 3.7–5.3)
SODIUM: 140 meq/L (ref 137–147)

## 2014-02-18 LAB — CBC WITH DIFFERENTIAL/PLATELET
Basophils Absolute: 0 10*3/uL (ref 0.0–0.1)
Basophils Relative: 1 % (ref 0–1)
EOS PCT: 5 % (ref 0–5)
Eosinophils Absolute: 0.3 10*3/uL (ref 0.0–0.7)
HEMATOCRIT: 32 % — AB (ref 39.0–52.0)
Hemoglobin: 10.9 g/dL — ABNORMAL LOW (ref 13.0–17.0)
LYMPHS ABS: 1.1 10*3/uL (ref 0.7–4.0)
Lymphocytes Relative: 18 % (ref 12–46)
MCH: 30.6 pg (ref 26.0–34.0)
MCHC: 34.1 g/dL (ref 30.0–36.0)
MCV: 89.9 fL (ref 78.0–100.0)
Monocytes Absolute: 0.7 10*3/uL (ref 0.1–1.0)
Monocytes Relative: 11 % (ref 3–12)
Neutro Abs: 4.1 10*3/uL (ref 1.7–7.7)
Neutrophils Relative %: 65 % (ref 43–77)
Platelets: 235 10*3/uL (ref 150–400)
RBC: 3.56 MIL/uL — AB (ref 4.22–5.81)
RDW: 13.1 % (ref 11.5–15.5)
WBC: 6.3 10*3/uL (ref 4.0–10.5)

## 2014-02-18 LAB — I-STAT TROPONIN, ED
TROPONIN I, POC: 0 ng/mL (ref 0.00–0.08)
Troponin i, poc: 0.01 ng/mL (ref 0.00–0.08)

## 2014-02-18 LAB — TSH: TSH: 3.39 u[IU]/mL (ref 0.350–4.500)

## 2014-02-18 LAB — PRO B NATRIURETIC PEPTIDE: Pro B Natriuretic peptide (BNP): 491.9 pg/mL — ABNORMAL HIGH (ref 0–450)

## 2014-02-18 MED ORDER — ATORVASTATIN CALCIUM 20 MG PO TABS
20.0000 mg | ORAL_TABLET | Freq: Every evening | ORAL | Status: DC
  Administered 2014-02-18 – 2014-02-19 (×2): 20 mg via ORAL
  Filled 2014-02-18 (×3): qty 1

## 2014-02-18 MED ORDER — ENOXAPARIN SODIUM 40 MG/0.4ML ~~LOC~~ SOLN
40.0000 mg | SUBCUTANEOUS | Status: DC
  Administered 2014-02-18 – 2014-02-19 (×2): 40 mg via SUBCUTANEOUS
  Filled 2014-02-18 (×4): qty 0.4

## 2014-02-18 MED ORDER — ONDANSETRON 4 MG PO TBDP
4.0000 mg | ORAL_TABLET | Freq: Once | ORAL | Status: AC
  Administered 2014-02-18: 4 mg via ORAL
  Filled 2014-02-18: qty 1

## 2014-02-18 MED ORDER — TRAMADOL HCL 50 MG PO TABS
50.0000 mg | ORAL_TABLET | Freq: Three times a day (TID) | ORAL | Status: DC | PRN
  Administered 2014-02-19 – 2014-02-20 (×2): 50 mg via ORAL
  Filled 2014-02-18 (×2): qty 1

## 2014-02-18 MED ORDER — BACLOFEN 10 MG PO TABS
10.0000 mg | ORAL_TABLET | Freq: Three times a day (TID) | ORAL | Status: DC | PRN
  Filled 2014-02-18: qty 1

## 2014-02-18 MED ORDER — TEMAZEPAM 7.5 MG PO CAPS: 7.5000 mg | ORAL_CAPSULE | Freq: Every evening | ORAL | Status: DC | PRN

## 2014-02-18 MED ORDER — ONDANSETRON HCL 4 MG/2ML IJ SOLN: 4.0000 mg | Freq: Four times a day (QID) | INTRAMUSCULAR | Status: DC | PRN

## 2014-02-18 MED ORDER — TRAMADOL HCL 50 MG PO TABS: 25.0000 mg | ORAL_TABLET | Freq: Two times a day (BID) | ORAL | Status: DC | PRN

## 2014-02-18 MED ORDER — HYDROCODONE-ACETAMINOPHEN 5-325 MG PO TABS
1.0000 | ORAL_TABLET | Freq: Once | ORAL | Status: AC
  Administered 2014-02-18: 1 via ORAL
  Filled 2014-02-18: qty 1

## 2014-02-18 MED ORDER — OXYCODONE HCL 5 MG PO TABS
5.0000 mg | ORAL_TABLET | Freq: Once | ORAL | Status: AC
  Administered 2014-02-19: 5 mg via ORAL
  Filled 2014-02-18 (×2): qty 1

## 2014-02-18 MED ORDER — ONDANSETRON HCL 4 MG/2ML IJ SOLN: 4.0000 mg | Freq: Once | INTRAMUSCULAR | Status: DC

## 2014-02-18 MED ORDER — SODIUM CHLORIDE 0.9 % IJ SOLN
3.0000 mL | Freq: Two times a day (BID) | INTRAMUSCULAR | Status: DC
  Administered 2014-02-18 – 2014-02-20 (×4): 3 mL via INTRAVENOUS

## 2014-02-18 MED ORDER — FUROSEMIDE 10 MG/ML IJ SOLN
40.0000 mg | Freq: Once | INTRAMUSCULAR | Status: AC
  Administered 2014-02-18: 40 mg via INTRAVENOUS

## 2014-02-18 MED ORDER — ONDANSETRON HCL 4 MG PO TABS: 4.0000 mg | ORAL_TABLET | Freq: Four times a day (QID) | ORAL | Status: DC | PRN

## 2014-02-18 MED ORDER — ASPIRIN 81 MG PO CHEW
81.0000 mg | CHEWABLE_TABLET | Freq: Every day | ORAL | Status: DC
  Administered 2014-02-19 – 2014-02-20 (×2): 81 mg via ORAL
  Filled 2014-02-18 (×2): qty 1

## 2014-02-18 MED ORDER — MECLIZINE HCL 12.5 MG PO TABS
12.5000 mg | ORAL_TABLET | Freq: Two times a day (BID) | ORAL | Status: DC | PRN
  Filled 2014-02-18: qty 1

## 2014-02-18 MED ORDER — LEVALBUTEROL HCL 0.63 MG/3ML IN NEBU
0.6300 mg | INHALATION_SOLUTION | Freq: Four times a day (QID) | RESPIRATORY_TRACT | Status: DC | PRN
  Filled 2014-02-18: qty 3

## 2014-02-18 MED ORDER — MORPHINE SULFATE 4 MG/ML IJ SOLN: 4.0000 mg | Freq: Once | INTRAMUSCULAR | Status: DC

## 2014-02-18 MED ORDER — TAMSULOSIN HCL 0.4 MG PO CAPS
0.8000 mg | ORAL_CAPSULE | Freq: Every day | ORAL | Status: DC
  Administered 2014-02-18 – 2014-02-19 (×2): 0.8 mg via ORAL
  Filled 2014-02-18 (×3): qty 2

## 2014-02-18 MED ORDER — FINASTERIDE 5 MG PO TABS
5.0000 mg | ORAL_TABLET | Freq: Every day | ORAL | Status: DC
  Administered 2014-02-19 – 2014-02-20 (×2): 5 mg via ORAL
  Filled 2014-02-18 (×2): qty 1

## 2014-02-18 MED ORDER — BETAXOLOL HCL 0.25 % OP SUSP
1.0000 [drp] | Freq: Every day | OPHTHALMIC | Status: DC
  Administered 2014-02-19 – 2014-02-20 (×2): 1 [drp] via OPHTHALMIC
  Filled 2014-02-18 (×2): qty 10

## 2014-02-18 MED ORDER — HYDRALAZINE HCL 10 MG PO TABS
10.0000 mg | ORAL_TABLET | Freq: Three times a day (TID) | ORAL | Status: DC
  Administered 2014-02-18 – 2014-02-20 (×5): 10 mg via ORAL
  Filled 2014-02-18 (×7): qty 1

## 2014-02-18 NOTE — ED Notes (Addendum)
Pt reports to the ED for eval of back, SOB, and left shoulder pain. Pt arrives via [facility] from [place]. Pt has chronic back and left shoulder pain but they are both worse than normal and then this morning he developed SOB. Pt 100% on RA at this time. Pt denies any tingling, numbness, or paralysis. Pt also denies any bowel or bladder changes. Pt also reports he was dizzy this morning and had his BP checked and it was normal so the nurse at the SNF gave him some medication but he is unsure of what it was. Pt denies any dizziness at this time. 12 lead NSR. Pt denies any CP. VSS en route. Pt A&Ox4, resp e/u, and skin warm and dry.

## 2014-02-18 NOTE — H&P (Signed)
[facility] History and Physical  Lance Kemp ELF:[id number] DOB: 05-Nov-1922 DOA: 02/18/2014  Referring physician: ED  PCP: [name], MD   Chief Complaint: Back pain and shoulder pain HPI:  80 year old male with Past medical history of CVA, chronic venous insufficiency, peripheral neuropathy, non-STEMI, arthritis, hypertension presented with worsening back and shoulder pain as well as shortness of breath. Patient was found to be hypoxic with oxygen saturation of about 84% with ambulation that quickly corrected to about 90% at rest. Patient's Lasix was recently decreased by his PCP because of increase in his BUN and creatinine and increased urinary frequency at night. Patient also was recently seen by [facility], as well as Dr. Nelva Bush and was initiated on prednisone and baclofen for his back pain. Patient complains of low back pain with pain radiating into his left leg, left shoulder pain and neck pain. The patient also takes Aleve occasionally for his back pain.Denies weakness, loss of bowel/bladder function or saddle anesthesia. Denies neck stiffness, headache, rash. Denies fever or recent procedures to back.  Patient is unsure of how much weight he has gained over time. He states that he thought his weight was in the high 150s however at his nursing facility has been about 171 for the past week and a half.   Patient is primarily being admitted for his hypoxia. He does have worsening leg swelling since he cut back on the Lasix. He complains of bradycardia and on prednisone. Does not feel that this is helping his pain significantly. He denies any chest pain. Chest x-ray does not show any acute cardiopulmonary disease. EKG is unchanged.       Review of Systems: negative for the following  Constitutional: Denies fever, chills, diaphoresis, appetite change and fatigue.  HEENT: Denies photophobia, eye pain, redness, hearing loss, ear pain, congestion, sore throat,  rhinorrhea, sneezing, mouth sores, trouble swallowing, neck pain, neck stiffness and tinnitus.  Respiratory: Positive for SOB, DOE, cough, chest tightness, and wheezing.  Cardiovascular: Denies chest pain, palpitations and leg swelling.  Gastrointestinal: Denies nausea, vomiting, abdominal pain, diarrhea, constipation, blood in stool and abdominal distention.  Genitourinary: Denies dysuria, urgency, frequency, hematuria, flank pain and difficulty urinating.  Musculoskeletal: Denies myalgias, back pain, joint swelling, arthralgias and gait problem.  Skin: Denies pallor, rash and wound.  Neurological: Denies dizziness, seizures, syncope, weakness, light-headedness, numbness and headaches.  Hematological: Denies adenopathy. Easy bruising, personal or family bleeding history  Psychiatric/Behavioral: Denies suicidal ideation, mood changes, confusion, nervousness, sleep disturbance and agitation       Past Medical History  Diagnosis Date  . Osteoarthritis     hands/wrists  . Depression     antidepressant x 1 yr--situational. Resolved 08/2013 per pt/son.  . Glaucoma   . Hypertension   . 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  No significant residual deficit except short term memory impairment  . Urine incontinence   . Frequent headaches   . Chronic venous insufficiency     compression hose  . Peripheral neuropathy 2013    Dx'd by neuro in [place] at the time the patient was hospitalized briefly for pontine CVA.  . NSTEMI (non-ST elevated myocardial infarction) 06/2011    [place]: Troponin mildly elevated, normal EKG, normal ECHO, pt declined cardiology consult--per old PCP records.  . Basal cell carcinoma 2011    Face  . Chronic renal insufficiency, stage III (moderate) 2013    CrCl in the 50s  . Chronic diastolic heart failure   . Chronic left shoulder  pain 09/14/2013  . Normocytic anemia 2015    secondary to CRI (iron studies ok 12/2013)     Past Surgical History   Procedure Laterality Date  . Cholecystectomy    . Tonsilectomy, adenoidectomy, bilateral myringotomy and tubes    . Transthoracic echocardiogram  12/18/13    Grade 1 diast dysf, o/w normal      Social History:  reports that he has quit smoking. He has never used smokeless tobacco. He reports that he does not drink alcohol or use illicit drugs.     Allergies  Allergen Reactions  . Altace [Ramipril] Other (See Comments)    As per old records from [place]  . Amoxicillin Other (See Comments)    Per old records from [place]  . Cozaar [Losartan Potassium] Other (See Comments)    As per old records from [place] History  Problem Relation Age of Onset  . Cancer Mother   . Heart disease Mother      Prior to Admission medications   Medication Sig Start Date End Date Taking? Authorizing Provider  aspirin 81 MG chewable tablet Chew 81 mg by mouth daily.   Yes Historical Provider, MD  atorvastatin (LIPITOR) 20 MG tablet Take 20 mg by mouth every evening.  10/28/13  Yes Historical Provider, MD  azelastine (OPTIVAR) 0.05 % ophthalmic solution Place 1 drop into the right eye 2 (two) times daily.  07/22/13  Yes Historical Provider, MD  baclofen (LIORESAL) 10 MG tablet Take 10 mg by mouth 3 (three) times daily as needed for muscle spasms.    Yes Historical Provider, MD  BETOPTIC-S 0.25 % ophthalmic suspension Place 1 drop into the right eye daily.  07/22/13  Yes Historical Provider, MD  finasteride (PROSCAR) 5 MG tablet Take 1 tablet (5 mg total) by mouth daily. 01/28/14  Yes [name], MD  furosemide (LASIX) 40 MG tablet Take 40 mg by mouth daily.   Yes Historical Provider, MD  hydrALAZINE (APRESOLINE) 10 MG tablet Take 10 mg by mouth 3 (three) times daily.  08/12/13  Yes Historical Provider, MD  meclizine (ANTIVERT) 25 MG tablet Take 12.5 mg by mouth 2 (two) times daily as needed for dizziness.   Yes Historical Provider, MD  tamsulosin (FLOMAX) 0.4 MG CAPS capsule Take 2 capsules  (0.8 mg total) by mouth daily after supper. 01/02/14  Yes [name], MD  temazepam (RESTORIL) 7.5 MG capsule Take 1 capsule (7.5 mg total) by mouth at bedtime as needed for sleep. 01/14/14  Yes [name], MD  traMADol (ULTRAM) 50 MG tablet Take 0.5 tablets (25 mg total) by mouth every 12 (twelve) hours as needed for moderate pain. 12/20/13  Yes Sid Falcon, MD  valsartan (DIOVAN) 40 MG tablet Take 20 mg by mouth daily.  08/16/13  Yes Historical Provider, MD     Physical Exam: Filed Vitals:   02/18/14 1530 02/18/14 1600 02/18/14 1747 02/18/14 1800  BP: 152/69 123/77 126/47 130/48  Pulse: 69 75 79 79  Temp:      TempSrc:      Resp: 15 22 20 17   SpO2: 99% 95% 93% 90%     Constitutional: Vital signs reviewed. Patient is a well-developed and well-nourished in no acute distress and cooperative with exam. Alert and oriented x3.  Head: Normocephalic and atraumatic  Ear: TM normal bilaterally  Mouth: no erythema or exudates, MMM  Eyes: PERRL, EOMI, conjunctivae normal, No scleral icterus.  Neck: Supple, Trachea midline normal ROM,  No JVD, mass, thyromegaly, or carotid bruit present.  Cardiovascular: RRR, S1 normal, S2 normal, no MRG, pulses symmetric and intact bilaterally  Pulmonary/Chest: CTAB, no wheezes, rales, or rhonchi  Abdominal: Soft. Non-tender, non-distended, bowel sounds are normal, no masses, organomegaly, or guarding present.  GU: no CVA tenderness Musculoskeletal: Patient wearing compression stockings, No joint deformities, erythema, or stiffness, ROM full and no nontender Ext: no edema and no cyanosis, pulses palpable bilaterally (DP and PT)  Hematology: no cervical, inginal, or axillary adenopathy.  Neurological: A&O x3, Strenght is normal and symmetric bilaterally, cranial nerve II-XII are grossly intact, no focal motor deficit, sensory intact to light touch bilaterally.  Skin: Warm, dry and intact. No rash, cyanosis, or clubbing.  Psychiatric: Normal mood and  affect. speech and behavior is normal. Judgment and thought content normal. Cognition and memory are normal.       Labs on Admission:    Basic Metabolic Panel:  Recent Labs Lab 02/18/14 1426  NA 140  K 4.4  CL 105  CO2 23  GLUCOSE 111*  BUN 33*  CREATININE 1.27  CALCIUM 9.1   Liver Function Tests: No results found for this basename: AST, ALT, ALKPHOS, BILITOT, PROT, ALBUMIN,  in the last 168 hours No results found for this basename: LIPASE, AMYLASE,  in the last 168 hours No results found for this basename: AMMONIA,  in the last 168 hours CBC:  Recent Labs Lab 02/18/14 1426  WBC 6.3  NEUTROABS 4.1  HGB 10.9*  HCT 32.0*  MCV 89.9  PLT 235   Cardiac Enzymes: No results found for this basename: CKTOTAL, CKMB, CKMBINDEX, TROPONINI,  in the last 168 hours  BNP (last 3 results)  Recent Labs  12/17/13 1125 02/18/14 1426  PROBNP 338.9 491.9*      CBG: No results found for this basename: GLUCAP,  in the last 168 hours  Radiological Exams on Admission: Dg Chest 2 View  02/18/2014   CLINICAL DATA:  Back pain.  EXAM: CHEST  2 VIEW  COMPARISON:  12/17/2013  FINDINGS: No cardiomegaly. Stable upper mediastinal contours. There is thick atherosclerotic calcified plaque on the aortic arch.  Low lung volumes with interstitial crowding. There is no edema, consolidation, effusion, or pneumothorax. Cholecystectomy changes.  IMPRESSION: No active cardiopulmonary disease.   Electronically Signed   By: Jorje Guild M.D.   On: 02/18/2014 15:11    EKG: Independently reviewed. Normal sinus rhythm  Assessment/Plan Active Problems:   Hypoxia   SOB (shortness of breath)   Hypoxia secondary to acute on chronic diastolic heart failure Likely in the setting of acute on chronic diastolic heart failure Patient was given one dose of IV Lasix in the ER with significant improvement in her symptoms Most recent echo 12/18/13 Similar admission on 12/24/13 Symptoms improved with IV Lasix  during his last admission and today in the ER Patient made aware of the possibility of worsening renal function in the setting of diuresis Anticipate that the patient will increase his creatinine to a new baseline Continue compression stockings Will hold Diovan   Back pain, shoulder pain Discontinue prednisone Patient not be a candidate for NSAIDs Will start the patient on tramadol and continue baclofen Patient had an MRI of the right shoulder on 4/15 that showed Full-thickness retracted supraspinatus tendon tear We'll order PT/OT eval Followup with outpatient orthopedics   Hypertension Resume Avapro after patient has completed diuresing and creatinine stable Continue Lasix, hydralazine  History of CVA Continue aspirin  BPH Continue Flomax  Code  Status:   full Family Communication: bedside Disposition Plan: admit   Time spent: 70 mins   [facility] Pager [phone]  If 7PM-7AM, please contact night-coverage [email or web address] Password [facility] 02/18/2014, 6:32 PM

## 2014-02-18 NOTE — ED Notes (Signed)
PT ambulated in hallway. Steady gait but used EMT as walker. PT reported no dizziness or light-headedness. PT desated to 84% during ambulation.

## 2014-02-18 NOTE — ED Provider Notes (Signed)
CSN: [id number]     Arrival date & time 02/18/14  1328 History   First MD Initiated Contact with Patient 02/18/14 1508     Chief Complaint  Patient presents with  . Back Pain     (Consider location/radiation/quality/duration/timing/severity/associated sxs/prior Treatment) HPI Lance Kemp Is a very pleasant 80 year old male with a past medical history of chronic back and chronic left shoulder pain presents to the emergency department with chief complaint of worsening back and shoulder pain as well as shortness of breath. Patient states that he was started on prednisone one week ago for pain control. He complains of worsening swelling in his legs, weight gain fatigue on the prednisone. He does not feel that it is helping his pain significantly. He complains that this morning he was feeling dizzy and went to the nurse at his nursing home. She stated he had normal vital signs. He complained of some shortness of breath and was given an extra dose of Lasix. He denies any chest pain, nausea, vomiting, left shoulder pain or jaw pain. He denies nausea, vomiting or diaphoresis. The patient states that he low back and shoulder pain has been worse since he moved into a nursing home essentially because he is only given one Aleve daily. He states that he used to take 2 Aleve daily and that seemed to control his pain more significantly. Denies weakness, loss of bowel/bladder function or saddle anesthesia. Denies neck stiffness, headache, rash.  Denies fever or recent procedures to back. Patient is unsure of how much weight he has gained over time. He states that he thought his weight was in the high 150s however at his nursing facility has been about 171 for the past week and a half. Denies fevers, chills, myalgias. Denies chest tightness or pressure, radiation to left arm, jaw or back, or diaphoresis. Denies dysuria, flank pain, suprapubic pain, frequency, urgency, or hematuria. Denies headaches, light  headedness, weakness, visual disturbances. Denies abdominal pain, nausea, vomiting, diarrhea or constipation.     Past Medical History  Diagnosis Date  . Osteoarthritis     hands/wrists  . Depression     antidepressant x 1 yr--situational. Resolved 08/2013 per pt/son.  . Glaucoma   . Hypertension   . 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  No significant residual deficit except short term memory impairment  . Urine incontinence   . Frequent headaches   . Chronic venous insufficiency     compression hose  . Peripheral neuropathy 2013    Dx'd by neuro in [place] at the time the patient was hospitalized briefly for pontine CVA.  . NSTEMI (non-ST elevated myocardial infarction) 06/2011    [place]: Troponin mildly elevated, normal EKG, normal ECHO, pt declined cardiology consult--per old PCP records.  . Basal cell carcinoma 2011    Face  . Chronic renal insufficiency, stage III (moderate) 2013    CrCl in the 50s  . Chronic diastolic heart failure   . Chronic left shoulder pain 09/14/2013  . Normocytic anemia 2015    secondary to CRI (iron studies ok 12/2013)   Past Surgical History  Procedure Laterality Date  . Cholecystectomy    . Tonsilectomy, adenoidectomy, bilateral myringotomy and tubes    . Transthoracic echocardiogram  12/18/13    Grade 1 diast dysf, o/w normal   Family History  Problem Relation Age of Onset  . Cancer Mother   . Heart disease Mother    History  Substance Use Topics  . Smoking status: Former Research scientist (life sciences)  .  Smokeless tobacco: Never Used  . Alcohol Use: No    Review of Systems  Ten systems reviewed and are negative for acute change, except as noted in the HPI.    Allergies  Altace; Amoxicillin; and Cozaar  Home Medications   Prior to Admission medications   Medication Sig Start Date End Date Taking? Authorizing Provider  aspirin 81 MG chewable tablet Chew 81 mg by mouth daily.   Yes Historical Provider, MD  atorvastatin (LIPITOR) 20 MG  tablet Take 20 mg by mouth every evening.  10/28/13  Yes Historical Provider, MD  azelastine (OPTIVAR) 0.05 % ophthalmic solution Place 1 drop into the right eye 2 (two) times daily.  07/22/13  Yes Historical Provider, MD  baclofen (LIORESAL) 10 MG tablet Take 10 mg by mouth 3 (three) times daily as needed for muscle spasms.    Yes Historical Provider, MD  BETOPTIC-S 0.25 % ophthalmic suspension Place 1 drop into the right eye daily.  07/22/13  Yes Historical Provider, MD  finasteride (PROSCAR) 5 MG tablet Take 1 tablet (5 mg total) by mouth daily. 01/28/14  Yes [name], MD  furosemide (LASIX) 40 MG tablet Take 40 mg by mouth daily.   Yes Historical Provider, MD  hydrALAZINE (APRESOLINE) 10 MG tablet Take 10 mg by mouth 3 (three) times daily.  08/12/13  Yes Historical Provider, MD  meclizine (ANTIVERT) 25 MG tablet Take 12.5 mg by mouth 2 (two) times daily as needed for dizziness.   Yes Historical Provider, MD  tamsulosin (FLOMAX) 0.4 MG CAPS capsule Take 2 capsules (0.8 mg total) by mouth daily after supper. 01/02/14  Yes [name], MD  temazepam (RESTORIL) 7.5 MG capsule Take 1 capsule (7.5 mg total) by mouth at bedtime as needed for sleep. 01/14/14  Yes [name], MD  traMADol (ULTRAM) 50 MG tablet Take 0.5 tablets (25 mg total) by mouth every 12 (twelve) hours as needed for moderate pain. 12/20/13  Yes Sid Falcon, MD  valsartan (DIOVAN) 40 MG tablet Take 20 mg by mouth daily.  08/16/13  Yes Historical Provider, MD   BP 118/55  Pulse 78  Temp(Src) 97.6 F (36.4 C) (Oral)  Resp 24  SpO2 96% Physical Exam  Nursing note and vitals reviewed. Constitutional: He is oriented to person, place, and time. He appears well-developed and well-nourished. No distress.  HENT:  Head: Normocephalic and atraumatic.  Eyes: Conjunctivae are normal. No scleral icterus.  Neck: Normal range of motion. Neck supple.  Cardiovascular: Normal rate, regular rhythm and normal heart sounds.    Pulmonary/Chest: Effort normal and breath sounds normal. No respiratory distress. He has no rales.  Abdominal: Soft. There is no tenderness.  Musculoskeletal: He exhibits no edema.  Neurological: He is alert and oriented to person, place, and time.  Skin: Skin is warm and dry. He is not diaphoretic.  Psychiatric: His behavior is normal.    ED Course  Procedures (including critical care time) Labs Review Labs Reviewed  CBC WITH DIFFERENTIAL - Abnormal; Notable for the following:    RBC 3.56 (*)    Hemoglobin 10.9 (*)    HCT 32.0 (*)    All other components within normal limits  BASIC METABOLIC PANEL  PRO B NATRIURETIC PEPTIDE  I-STAT TROPOININ, ED    Imaging Review Dg Chest 2 View  02/18/2014   CLINICAL DATA:  Back pain.  EXAM: CHEST  2 VIEW  COMPARISON:  12/17/2013  FINDINGS: No cardiomegaly. Stable upper mediastinal contours. There is thick  atherosclerotic calcified plaque on the aortic arch.  Low lung volumes with interstitial crowding. There is no edema, consolidation, effusion, or pneumothorax. Cholecystectomy changes.  IMPRESSION: No active cardiopulmonary disease.   Electronically Signed   By: Jorje Guild M.D.   On: 02/18/2014 15:11     EKG Interpretation   Date/Time:  Wednesday February 18 2014 16:41:30 EDT Ventricular Rate:  82 PR Interval:  137 QRS Duration: 92 QT Interval:  385 QTC Calculation: 450 R Axis:   -31 Text Interpretation:  Sinus rhythm Probable left atrial enlargement  Abnormal R-wave progression, early transition Left ventricular hypertrophy  Artifact When compared with ECG of 12/17/2013 No significant change was  found Confirmed by [facility]  MD, Nunzio Cory [phone]) on 02/18/2014 4:49:31 PM      MDM   Final diagnoses:  None    Patient CXR negative for any edema. He does appear of breath with movement on the bed. He has urinated approximately 3 times and he's been here  He continues to have shortness of breath.   5:39 PM EF 55-60% on  admission 12/21/2013. EKG unremarkable for acute change. Data troponin is negative Pro BNP is elevated into the 400s which is nonspecific. Patient admitted for hypoxia in July of 2015. He was created a were CHF exacerbation with Lasix. He again desaturated with ambulation to 84% and during examination of the patient becomes very short of breath with even adjusting himself in the bed. The patient appears to need another admission for his hypoxia. Considering his hypoxia patient may need further pulmonary workup including PFTs.  Patient accpted for admission by Dr. Allyson Sabal Patient seen in shared visit with attending physician.  The patient appears reasonably stabilized for admission considering the current resources, flow, and capabilities available in the ED at this time, and I doubt any other [facility] requiring further screening and/or treatment in the ED prior to admission.  I personally reviewed the imaging tests through PACS system. I have reviewed and interpreted Lab values. I reviewed available ER/hospitalization records through the [place], [place] 02/20/14 1734

## 2014-02-19 ENCOUNTER — Telehealth: Payer: Self-pay | Admitting: Family Medicine

## 2014-02-19 ENCOUNTER — Encounter (HOSPITAL_COMMUNITY): Payer: Self-pay | Admitting: Surgery

## 2014-02-19 DIAGNOSIS — I5033 Acute on chronic diastolic (congestive) heart failure: Principal | ICD-10-CM

## 2014-02-19 DIAGNOSIS — N401 Enlarged prostate with lower urinary tract symptoms: Secondary | ICD-10-CM

## 2014-02-19 DIAGNOSIS — Z8673 Personal history of transient ischemic attack (TIA), and cerebral infarction without residual deficits: Secondary | ICD-10-CM

## 2014-02-19 DIAGNOSIS — R0902 Hypoxemia: Secondary | ICD-10-CM

## 2014-02-19 DIAGNOSIS — M25519 Pain in unspecified shoulder: Secondary | ICD-10-CM

## 2014-02-19 DIAGNOSIS — G8929 Other chronic pain: Secondary | ICD-10-CM

## 2014-02-19 DIAGNOSIS — R351 Nocturia: Secondary | ICD-10-CM

## 2014-02-19 LAB — COMPREHENSIVE METABOLIC PANEL
ALT: 17 U/L (ref 0–53)
ALT: 17 U/L (ref 0–53)
ANION GAP: 12 (ref 5–15)
AST: 20 U/L (ref 0–37)
AST: 20 U/L (ref 0–37)
Albumin: 3.2 g/dL — ABNORMAL LOW (ref 3.5–5.2)
Albumin: 3.2 g/dL — ABNORMAL LOW (ref 3.5–5.2)
Alkaline Phosphatase: 79 U/L (ref 39–117)
Alkaline Phosphatase: 78 U/L (ref 39–117)
Anion gap: 15 (ref 5–15)
BILIRUBIN TOTAL: 0.4 mg/dL (ref 0.3–1.2)
BUN: 31 mg/dL — ABNORMAL HIGH (ref 6–23)
BUN: 31 mg/dL — AB (ref 6–23)
CALCIUM: 8.9 mg/dL (ref 8.4–10.5)
CALCIUM: 8.9 mg/dL (ref 8.4–10.5)
CHLORIDE: 100 meq/L (ref 96–112)
CO2: 25 mEq/L (ref 19–32)
CO2: 27 mEq/L (ref 19–32)
CREATININE: 1.39 mg/dL — AB (ref 0.50–1.35)
Chloride: 101 mEq/L (ref 96–112)
Creatinine, Ser: 1.43 mg/dL — ABNORMAL HIGH (ref 0.50–1.35)
GFR calc Af Amer: 50 mL/min — ABNORMAL LOW (ref >90)
GFR calc non Af Amer: 42 mL/min — ABNORMAL LOW (ref >90)
GFR, EST AFRICAN AMERICAN: 49 mL/min — AB (ref >90)
GFR, EST NON AFRICAN AMERICAN: 43 mL/min — AB (ref >90)
GLUCOSE: 121 mg/dL — AB (ref 70–99)
GLUCOSE: 146 mg/dL — AB (ref 70–99)
POTASSIUM: 4.1 meq/L (ref 3.7–5.3)
Potassium: 4.5 mEq/L (ref 3.7–5.3)
SODIUM: 140 meq/L (ref 137–147)
Sodium: 140 mEq/L (ref 137–147)
Total Bilirubin: 0.4 mg/dL (ref 0.3–1.2)
Total Protein: 6.4 g/dL (ref 6.0–8.3)
Total Protein: 6.3 g/dL (ref 6.0–8.3)

## 2014-02-19 LAB — CBC
HCT: 30.2 % — ABNORMAL LOW (ref 39.0–52.0)
HEMOGLOBIN: 10.3 g/dL — AB (ref 13.0–17.0)
MCH: 30.9 pg (ref 26.0–34.0)
MCHC: 34.1 g/dL (ref 30.0–36.0)
MCV: 90.7 fL (ref 78.0–100.0)
Platelets: 239 10*3/uL (ref 150–400)
RBC: 3.33 MIL/uL — AB (ref 4.22–5.81)
RDW: 13.2 % (ref 11.5–15.5)
WBC: 6.8 10*3/uL (ref 4.0–10.5)

## 2014-02-19 LAB — MRSA PCR SCREENING: MRSA by PCR: NEGATIVE

## 2014-02-19 MED ORDER — FUROSEMIDE 10 MG/ML IJ SOLN
40.0000 mg | Freq: Two times a day (BID) | INTRAMUSCULAR | Status: DC
  Administered 2014-02-19 – 2014-02-20 (×3): 40 mg via INTRAVENOUS
  Filled 2014-02-19 (×4): qty 4

## 2014-02-19 MED ORDER — POTASSIUM CHLORIDE CRYS ER 20 MEQ PO TBCR
40.0000 meq | EXTENDED_RELEASE_TABLET | Freq: Once | ORAL | Status: AC
  Administered 2014-02-19: 40 meq via ORAL
  Filled 2014-02-19: qty 2

## 2014-02-19 MED ORDER — POTASSIUM CHLORIDE 20 MEQ PO PACK: 40.0000 meq | PACK | Freq: Once | ORAL | Status: DC

## 2014-02-19 NOTE — Care Management Note (Signed)
    Page 1 of 1   02/19/2014     12:04:59 PM CARE MANAGEMENT NOTE 02/19/2014  Patient:  Lance Kemp, Lance Kemp   Account Number:  [id number]  Date Initiated:  02/19/2014  Documentation initiated by:  [facility]  Subjective/Objective Assessment:   Patient is primarily being admitted for his hypoxia.Patient was found to be hypoxic with oxygen saturation of about 84% with ambulation that quickly corrected to about 90% at rest. He does have worsening leg swelling.//From [facility].     Action/Plan:   IV diureses, pain meds.//Access for disposition needs.   Anticipated DC Date:  02/20/2014   Anticipated DC Plan:  [place] referral  Clinical Social Worker      DC Planning Services  CM consult      [facility] Choice  Resumption Of Svcs/PTA Provider   Choice offered to / List presented to:             Status of service:  In process, will continue to follow Medicare Important Message given?   (If response is "NO", the following Medicare IM given date fields will be blank) Date Medicare IM given:   Medicare IM given by:   Date Additional Medicare IM given:   Additional Medicare IM given by:    Discharge Disposition:    Per UR Regulation:  Reviewed for med. necessity/level of care/duration of stay  If discussed at [place] of Stay Meetings, dates discussed:    Comments:  02/19/14 [place], RN, BSN, NCM [phone] Pt is resident of [facility] in [facility]. NCM spoke to Jackelyn Poling [phone]) to confirm resident and inquire about [facility] services for ALF.  This facility provides own [facility] and will proceed as ordered.

## 2014-02-19 NOTE — Progress Notes (Signed)
Patient Demographics  Lance Kemp, is a 80 y.o. male, DOB - 1922-07-29, ION:[id number]  Admit date - 02/18/2014   Admitting Physician Reyne Dumas, MD  Outpatient Primary MD for the patient is [name], MD  LOS - 1   Chief Complaint  Patient presents with  . Back Pain        Subjective:   Lance Kemp today has, No headache, No chest pain, No abdominal pain - No Nausea, No new weakness tingling or numbness, No Cough , reports his shortness of breath much improved, and he wants to go home.  Assessment & Plan    Active Problems:   HTN (hypertension), benign   History of CVA (cerebrovascular accident)   BPH associated with nocturia   Hypoxia   Acute on chronic diastolic heart failure   SOB (shortness of breath)   Chronic shoulder pain  Hypoxia  This is secondary to acute on chronic diastolic heart failure, currently appears to have been resolved, saturating in the mid 90s on room air.    acute on chronic diastolic heart failure  Most recent echo 12/18/13 , showing normal ejection fraction, and grade 1 diastolic dysfunction. Similar admission on 12/24/13  Will continue with diuresis, so far patient is -1150 ml fluid balance Monitor renal function in the setting of diuresis  Anticipate that the patient will increase his creatinine to a new baseline  Continue compression stockings  Will hold Diovan   Back pain, shoulder pain  Discontinue prednisone  Patient not be a candidate for NSAIDs  Will start the patient on tramadol and continue baclofen  Patient had an MRI of the right shoulder on 4/15 that showed Full-thickness retracted supraspinatus tendon tear  PT/OT eval  Followup with outpatient orthopedics   Hypertension  Resume Avapro after patient has completed diuresing and creatinine stable  Continue Lasix, hydralazine   History of CVA    Continue aspirin  BPH Continue with doxazosin and finasteride  Code Status: Full   Family Communication: tried to call son 9/3 and left message.  Disposition Plan: ALF in 24 to 48 hours.   Procedures  None   Consults   None   Medications  Scheduled Meds: . aspirin  81 mg Oral Daily  . atorvastatin  20 mg Oral QPM  . betaxolol  1 drop Right Eye Daily  . enoxaparin (LOVENOX) injection  40 mg Subcutaneous Q24H  . finasteride  5 mg Oral Daily  . furosemide  40 mg Intravenous Q12H  . hydrALAZINE  10 mg Oral TID  . ondansetron (ZOFRAN) IV  4 mg Intravenous Once  . sodium chloride  3 mL Intravenous Q12H  . tamsulosin  0.8 mg Oral QPC supper   Continuous Infusions:  PRN Meds:.baclofen, levalbuterol, meclizine, ondansetron (ZOFRAN) IV, ondansetron, temazepam, traMADol  DVT Prophylaxis  Lovenox   Lab Results  Component Value Date   PLT 239 02/19/2014    Antibiotics   none  Anti-infectives   None          Objective:   Filed Vitals:   02/18/14 1852 02/18/14 2208 02/19/14 0330 02/19/14 0632  BP: 128/59 129/60 112/52 113/46  Pulse: 84 72 75 73  Temp: 98 F (36.7 C) 97.9 F (36.6 C) 97.5 F (36.4  C) 97.5 F (36.4 C)  TempSrc: Oral Oral Oral Oral  Resp: 22 20 20 20   Height: 5\' 6"  (1.676 m)     Weight: 75 kg (165 lb 5.5 oz)  74.6 kg (164 lb 7.4 oz)   SpO2: 91% 95% 95% 96%    Wt Readings from Last 3 Encounters:  02/19/14 74.6 kg (164 lb 7.4 oz)  01/28/14 75.297 kg (166 lb)  01/02/14 74.844 kg (165 lb)     Intake/Output Summary (Last 24 hours) at 02/19/14 0847 Last data filed at 02/19/14 0843  Gross per 24 hour  Intake    240 ml  Output   1125 ml  Net   -885 ml     Physical Exam  Awake Alert, Oriented X 3, No new F.N deficits, Normal affect [place].AT,PERRAL Supple Neck,No JVD, No cervical lymphadenopathy appriciated.  Symmetrical Chest wall movement, Good air movement bilaterally, CTAB RRR,No Gallops,Rubs or new Murmurs, No Parasternal Heave,  positive for hepatojugular reflux +ve B.Sounds, Abd Soft, No tenderness, No organomegaly appriciated, No rebound - guarding or rigidity. No Cyanosis, Clubbing, No new Rash or bruise  , +2 bilateral lower extremity edema   Data Review   Micro Results Recent Results (from the past 240 hour(s))  MRSA PCR SCREENING     Status: None   Collection Time    02/19/14  3:57 AM      Result Value Ref Range Status   MRSA by PCR NEGATIVE  NEGATIVE Final   Comment:            The GeneXpert MRSA Assay (FDA     approved for NASAL specimens     only), is one component of a     comprehensive MRSA colonization     surveillance program. It is not     intended to diagnose MRSA     infection nor to guide or     monitor treatment for     MRSA infections.    Radiology Reports Dg Chest 2 View  02/18/2014   CLINICAL DATA:  Back pain.  EXAM: CHEST  2 VIEW  COMPARISON:  12/17/2013  FINDINGS: No cardiomegaly. Stable upper mediastinal contours. There is thick atherosclerotic calcified plaque on the aortic arch.  Low lung volumes with interstitial crowding. There is no edema, consolidation, effusion, or pneumothorax. Cholecystectomy changes.  IMPRESSION: No active cardiopulmonary disease.   Electronically Signed   By: Jorje Guild M.D.   On: 02/18/2014 15:11    CBC  Recent Labs Lab 02/18/14 1426 02/19/14 0335  WBC 6.3 6.8  HGB 10.9* 10.3*  HCT 32.0* 30.2*  PLT 235 239  MCV 89.9 90.7  MCH 30.6 30.9  MCHC 34.1 34.1  RDW 13.1 13.2  LYMPHSABS 1.1  --   MONOABS 0.7  --   EOSABS 0.3  --   BASOSABS 0.0  --     Chemistries   Recent Labs Lab 02/18/14 1426  NA 140  K 4.4  CL 105  CO2 23  GLUCOSE 111*  BUN 33*  CREATININE 1.27  CALCIUM 9.1   ------------------------------------------------------------------------------------------------------------------ estimated creatinine clearance is 35.6 ml/min (by C-G formula based on Cr of  1.27). ------------------------------------------------------------------------------------------------------------------ No results found for this basename: HGBA1C,  in the last 72 hours ------------------------------------------------------------------------------------------------------------------ No results found for this basename: CHOL, HDL, LDLCALC, TRIG, CHOLHDL, LDLDIRECT,  in the last 72 hours ------------------------------------------------------------------------------------------------------------------  Recent Labs  02/18/14 1426  TSH 3.390   ------------------------------------------------------------------------------------------------------------------ No results found for this basename: VITAMINB12, FOLATE, FERRITIN, TIBC, IRON, RETICCTPCT,  in the last 72 hours  Coagulation profile No results found for this basename: INR, PROTIME,  in the last 168 hours  No results found for this basename: DDIMER,  in the last 72 hours  Cardiac Enzymes No results found for this basename: CK, CKMB, TROPONINI, MYOGLOBIN,  in the last 168 hours ------------------------------------------------------------------------------------------------------------------ No components found with this basename: POCBNP,      Time Spent in minutes   30 minutes   Pellegrino Kennard M.D on 02/19/2014 at 8:47 AM  Between 7am to 7pm - Pager - [phone]  After 7pm go to [email or web address] - password [email or web address]  And look for the night coverage person covering for me after hours  [facility] Office  [phone]   **Disclaimer: This note may have been dictated with voice recognition software. Similar sounding words can inadvertently be transcribed and this note may contain transcription errors which may not have been corrected upon publication of note.**

## 2014-02-19 NOTE — Plan of Care (Signed)
Problem: Phase I Progression Outcomes Goal: Up in chair, BRP Outcome: Completed/Met Date Met:  02/19/14 Pt up to bathroom with standby assist with walker

## 2014-02-19 NOTE — Telephone Encounter (Signed)
Patient's son is concerned. Patient is in the hospital for the swelling in his legs & SOB. Patient's hospitalist Dr. Waldron Labs told patient that he would not discharge him until Mon 9/7 or Tues 02/24/14. Patient's son is asking if Dr. Anitra Lauth would be able to see why patient is being kept in the hospital for so long. The patient doesn't feel that bad.

## 2014-02-19 NOTE — Telephone Encounter (Signed)
Please advise 

## 2014-02-19 NOTE — Clinical Social Work Psychosocial (Signed)
     Clinical Social Work Department BRIEF PSYCHOSOCIAL ASSESSMENT 02/19/2014  Patient:  Lance Kemp, Lance Kemp     Account Number:  [id number]     Admit date:  02/18/2014  Clinical Social Worker:  Iona Coach  Date/Time:  02/19/2014 02:35 PM  Referred by:  Physician  Date Referred:  02/19/2014 Referred for  Other - See comment   Other Referral:   Return to ALF   Interview type:  Patient Other interview type:   Also spoke to Neoma Laming- at [place] Status:  [place] Admitted from facility:  Other Level of care:  Assisted Living Primary support name:  Lance Kemp    [phone] Primary support relationship to patient:  CHILD, ADULT Degree of support available:   Unknown.  Messages left for son to return call X's 2 but no return call as of this time.  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    CURRENT CONCERNS Current Concerns  Post-Acute Placement   Other Concerns:    SOCIAL WORK ASSESSMENT / PLAN 80 year old male- resident of [phone].  CSW met with patient who was noted to be very anxious during the visit- asking to return to the facility. "I don't need to be in the hospital anymore- why can't i leave?"  CSW provided support and patient was encouraged to follow the MD's recommendations for treatment.  He stated his legs are swollen but "they can be swollen back at [facility]."  CSW discussed benefits for treatment and medical stability and he verbalized understanding of same. 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  [place] for CSW to call his son as needed- 2 messages left with no return call as of this time. Fl2 placed on chart for MD's signatur. CSW spoke to Neoma Laming at [facility]- she stated that they want patient to return and can accept back tomorrow or over the weekend. If a weekend d/c- she must have the FL2 and d/c summary no later than 4pm on friday afternoon.  Her fax number is  [phone]. RNCM spoke to facility- they have their own [facility] agency and would arrange services if indicated.   Assessment/plan status:  Psychosocial Support/Ongoing Assessment of Needs Other assessment/ plan:   Information/referral to community resources:   None at this time.    PATIENTS/FAMILYS RESPONSE TO PLAN OF CARE: Patient is alert and oriented- very pleasant but anxious appearing gentleman. Patient states that he loves his "home" at [facility] and wants to return there asap.  He does not want to be in the hospital but agrees to treatment. The facility will send a staff member over to check on patient and provide additionl support and encoruagement. CSW will assist with d/c when medically stable.

## 2014-02-20 ENCOUNTER — Ambulatory Visit: Payer: Medicare Other | Admitting: Family Medicine

## 2014-02-20 ENCOUNTER — Encounter (HOSPITAL_COMMUNITY): Payer: Self-pay | Admitting: General Practice

## 2014-02-20 LAB — BASIC METABOLIC PANEL
Anion gap: 11 (ref 5–15)
BUN: 33 mg/dL — ABNORMAL HIGH (ref 6–23)
CO2: 27 meq/L (ref 19–32)
Calcium: 8.8 mg/dL (ref 8.4–10.5)
Chloride: 101 mEq/L (ref 96–112)
Creatinine, Ser: 1.55 mg/dL — ABNORMAL HIGH (ref 0.50–1.35)
GFR calc non Af Amer: 38 mL/min — ABNORMAL LOW (ref >90)
GFR, EST AFRICAN AMERICAN: 44 mL/min — AB (ref >90)
Glucose, Bld: 111 mg/dL — ABNORMAL HIGH (ref 70–99)
POTASSIUM: 4.6 meq/L (ref 3.7–5.3)
Sodium: 139 mEq/L (ref 137–147)

## 2014-02-20 MED ORDER — HYDROCODONE-ACETAMINOPHEN 5-325 MG PO TABS
1.0000 | ORAL_TABLET | Freq: Four times a day (QID) | ORAL | Status: DC | PRN
  Administered 2014-02-20: 1 via ORAL
  Filled 2014-02-20: qty 1

## 2014-02-20 MED ORDER — HYDROCODONE-ACETAMINOPHEN 5-325 MG PO TABS: 1.0000 | ORAL_TABLET | Freq: Four times a day (QID) | ORAL | Status: DC | PRN

## 2014-02-20 NOTE — Discharge Summary (Signed)
Lance Kemp, 80 y.o., DOB 1923-03-23, MRN [id number]. Admission date: 02/18/2014 Discharge Date 02/20/2014 Primary MD [name], MD Admitting Physician Reyne Dumas, MD  Admission Diagnosis  DOE (dyspnea on exertion) [786.09] Hypoxia [799.02] Chronic back pain [724.5, 338.29] Chronic shoulder pain, left [719.41, 338.29]  Discharge Diagnosis   Active Problems:   HTN (hypertension), benign   History of CVA (cerebrovascular accident)   BPH associated with nocturia   Hypoxia   Acute on chronic diastolic heart failure   SOB (shortness of breath)   Chronic shoulder pain      Past Medical History  Diagnosis Date  . Osteoarthritis     hands/wrists  . Depression     antidepressant x 1 yr--situational. Resolved 08/2013 per pt/son.  . Glaucoma   . Hypertension   . 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  No significant residual deficit except short term memory impairment  . Urine incontinence   . Frequent headaches   . Chronic venous insufficiency     compression hose  . Peripheral neuropathy 2013    Dx'd by neuro in [place] at the time the patient was hospitalized briefly for pontine CVA.  . NSTEMI (non-ST elevated myocardial infarction) 06/2011    [place]: Troponin mildly elevated, normal EKG, normal ECHO, pt declined cardiology consult--per old PCP records.  . Basal cell carcinoma 2011    Face  . Chronic renal insufficiency, stage III (moderate) 2013    CrCl in the 50s  . Chronic diastolic heart failure   . Chronic left shoulder pain 09/14/2013  . Normocytic anemia 2015    secondary to CRI (iron studies ok 12/2013)  . Shortness of breath     Past Surgical History  Procedure Laterality Date  . Cholecystectomy    . Tonsilectomy, adenoidectomy, bilateral myringotomy and tubes    . Transthoracic echocardiogram  12/18/13    Grade 1 diast dysf, o/w normal   HPI:  80 year old male with Past medical history of CVA, chronic venous insufficiency, peripheral neuropathy,  non-STEMI, arthritis, hypertension presented with worsening back and shoulder pain as well as shortness of breath. Patient was found to be hypoxic with oxygen saturation of about 84% with ambulation that quickly corrected to about 90% at rest. Patient's Lasix was recently decreased by his PCP because of increase in his BUN and creatinine and increased urinary frequency at night. Patient also was recently seen by [place], as well as Dr. Nelva Bush and was initiated on prednisone and baclofen for his back pain. Patient complains of low back pain with pain radiating into his left leg, left shoulder pain and neck pain. The patient also takes Aleve occasionally for his back pain.Denies weakness, loss of bowel/bladder function or saddle anesthesia. Denies neck stiffness, headache, rash. Denies fever or recent procedures to back.  Patient is unsure of how much weight he has gained over time. He states that he thought his weight was in the high 150s however at his nursing facility has been about 171 for the past week and a half.  Patient is primarily being admitted for his hypoxia. He does have worsening leg swelling since he cut back on the Lasix. He complains of bradycardia and on prednisone. Does not feel that this is helping his pain significantly. He denies any chest pain. Chest x-ray does not show any acute cardiopulmonary disease. EKG is unchanged.   Hospital Course See H&P, Labs, Consult and Test reports for all details in brief,   Hypoxia  This is secondary to acute  on chronic diastolic heart failure, currently appears to have been resolved, saturating in the mid 90s on room air.  acute on chronic diastolic heart failure  Most recent echo 12/18/13 , showing normal ejection fraction, and grade 1 diastolic dysfunction.  Similar admission on 12/24/13  Patient was diuresed with IV Lasix, -1500 cc during his hospital stay Will continue to hold  Diovan after discharge.  Back pain, shoulder pain   Discontinued prednisone  Patient not be a candidate for NSAIDs  Will start the patient on tramadol and continue baclofen , as well started him on Vicodin as needed as did not have significant relief with tramadol. Patient had an MRI of the right shoulder on 4/15 that showed Full-thickness retracted supraspinatus tendon tear  PT/OT evaluated the patient Followup with outpatient orthopedics   Hypertension  Holding Diovan as he had slight bump in his creatinine on diuresis from 1.39-1.55 Continue Lasix, hydralazine   History of CVA  Continue aspirin   BPH  Continue with doxazosin and finasteride      Active Problems:   HTN (hypertension), benign   History of CVA (cerebrovascular accident)   BPH associated with nocturia   Hypoxia   Acute on chronic diastolic heart failure   SOB (shortness of breath)   Chronic shoulder pain    Consult PT   Dg Chest 2 View  02/18/2014   CLINICAL DATA:  Back pain.  EXAM: CHEST  2 VIEW  COMPARISON:  12/17/2013  FINDINGS: No cardiomegaly. Stable upper mediastinal contours. There is thick atherosclerotic calcified plaque on the aortic arch.  Low lung volumes with interstitial crowding. There is no edema, consolidation, effusion, or pneumothorax. Cholecystectomy changes.  IMPRESSION: No active cardiopulmonary disease.   Electronically Signed   By: Jorje Guild M.D.   On: 02/18/2014 15:11     Today   Subjective:   Lance Kemp today has no headache,no chest abdominal pain,no new weakness tingling or numbness, feels much better wants to go home today.   Objective:   Blood pressure 109/85, pulse 76, temperature 97.4 F (36.3 C), temperature source Oral, resp. rate 18, height 5\' 6"  (1.676 m), weight 74.2 kg (163 lb 9.3 oz), SpO2 96.00%.  Intake/Output Summary (Last 24 hours) at 02/20/14 1120 Last data filed at 02/20/14 0814  Gross per 24 hour  Intake    960 ml  Output   1175 ml  Net   -215 ml    Exam Awake Alert, Oriented *3, No new  F.N deficits, Normal affect [place].AT,PERRAL Supple Neck,No JVD, No cervical lymphadenopathy appriciated.  Symmetrical Chest wall movement, Good air movement bilaterally, CTAB RRR,No Gallops,Rubs or new Murmurs, No Parasternal Heave +ve B.Sounds, Abd Soft, Non tender, No organomegaly appriciated, No rebound -guarding or rigidity. No Cyanosis, Clubbing , No new Rash or bruise, edema improved, +1-2  Data Reviewe  CBC w Diff: Lab Results  Component Value Date   WBC 6.8 02/19/2014   HGB 10.3* 02/19/2014   HCT 30.2* 02/19/2014   PLT 239 02/19/2014   LYMPHOPCT 18 02/18/2014   MONOPCT 11 02/18/2014   EOSPCT 5 02/18/2014   BASOPCT 1 02/18/2014   CMP: Lab Results  Component Value Date   NA 139 02/20/2014   K 4.6 02/20/2014   CL 101 02/20/2014   CO2 27 02/20/2014   BUN 33* 02/20/2014   CREATININE 1.55* 02/20/2014   CREATININE 1.44* 01/16/2014   PROT 6.3 02/19/2014   ALBUMIN 3.2* 02/19/2014   BILITOT 0.4 02/19/2014   ALKPHOS 78 02/19/2014  AST 20 02/19/2014   ALT 17 02/19/2014  .  Micro Results Recent Results (from the past 240 hour(s))  MRSA PCR SCREENING     Status: None   Collection Time    02/19/14  3:57 AM      Result Value Ref Range Status   MRSA by PCR NEGATIVE  NEGATIVE Final   Comment:            The GeneXpert MRSA Assay (FDA     approved for NASAL specimens     only), is one component of a     comprehensive MRSA colonization     surveillance program. It is not     intended to diagnose MRSA     infection nor to guide or     monitor treatment for     MRSA infections.     Discharge Instructions      Follow-up Information   Follow up with [name], MD On 02/24/2014. (@11 :00 am spoke with Jonelle Sidle )    Specialty:  Family Medicine   Contact information:   [address] [place] [address] [phone]       Discharge Medications     Medication List    STOP taking these medications       valsartan 40 MG tablet  Commonly known as:  DIOVAN      TAKE these medications        aspirin 81 MG chewable tablet  Chew 81 mg by mouth daily.     atorvastatin 20 MG tablet  Commonly known as:  LIPITOR  Take 20 mg by mouth every evening.     azelastine 0.05 % ophthalmic solution  Commonly known as:  OPTIVAR  Place 1 drop into the right eye 2 (two) times daily.     baclofen 10 MG tablet  Commonly known as:  LIORESAL  Take 10 mg by mouth 3 (three) times daily as needed for muscle spasms.     BETOPTIC-S 0.25 % ophthalmic suspension  Generic drug:  betaxolol  Place 1 drop into the right eye daily.     finasteride 5 MG tablet  Commonly known as:  PROSCAR  Take 1 tablet (5 mg total) by mouth daily.     furosemide 40 MG tablet  Commonly known as:  LASIX  Take 40 mg by mouth daily.     hydrALAZINE 10 MG tablet  Commonly known as:  APRESOLINE  Take 10 mg by mouth 3 (three) times daily.     HYDROcodone-acetaminophen 5-325 MG per tablet  Commonly known as:  NORCO/VICODIN  Take 1 tablet by mouth every 6 (six) hours as needed for severe pain.     meclizine 25 MG tablet  Commonly known as:  ANTIVERT  Take 12.5 mg by mouth 2 (two) times daily as needed for dizziness.     tamsulosin 0.4 MG Caps capsule  Commonly known as:  FLOMAX  Take 2 capsules (0.8 mg total) by mouth daily after supper.     temazepam 7.5 MG capsule  Commonly known as:  RESTORIL  Take 1 capsule (7.5 mg total) by mouth at bedtime as needed for sleep.     traMADol 50 MG tablet  Commonly known as:  ULTRAM  Take 0.5 tablets (25 mg total) by mouth every 12 (twelve) hours as needed for moderate pain.         Total Time in preparing paper work, data evaluation and todays exam - 35 minutes  Nijah Tejera  M.D on 02/20/2014 at 11:20 AM  [facility] Office  [phone]

## 2014-02-20 NOTE — Evaluation (Signed)
Physical Therapy Evaluation Patient Details Name: Lance Kemp MRN: [id number] DOB: 1923/06/15 Today's Date: 02/20/2014   History of Present Illness  80 year old male with Past medical history of CVA, chronic venous insufficiency, peripheral neuropathy, non-STEMI, arthritis, hypertension presented with worsening back and shoulder pain as well as shortness of breath. Patient was found to be hypoxic with oxygen saturation of about 84% with ambulation that quickly corrected to about 90% at rest. Patient's Lasix was recently decreased by his PCP because of increase in his BUN and creatinine and increased urinary frequency at night. Patient also was recently seen by [place], as well as Dr. Nelva Bush and was initiated on prednisone and baclofen for his back pain. Patient complains of low back pain with pain radiating into his left leg, left shoulder pain and neck pain.   Clinical Impression  Pt admitted with hypoxia and referred to PT for back pain. Pt currently with functional limitations due to the deficits listed below (see PT Problem List).  Pt will benefit from skilled PT to increase their independence and safety with mobility to allow discharge to the venue listed below. Pt educated on proper body mechanics to put less strain on his back with transfers and with changing directions with gait.  Pt reports back pain is chronic.  Do not feel pt needs any follow up PT and that ALF can A pt as needed at current level.     Follow Up Recommendations No PT follow up    Equipment Recommendations  None recommended by PT    Recommendations for Other Services       Precautions / Restrictions        Mobility  Bed Mobility Overal bed mobility: Needs Assistance Bed Mobility: Sit to Sidelying;Sidelying to Sit   Sidelying to sit: Min guard     Sit to sidelying: Min assist General bed mobility comments: Pt educated on bed mobility and proper body mechanics to decrease back pain.  pt had  trouble maintaining proper positioning and needed A to help lift swollen legs up on bed with sit >sidelying  Transfers Overall transfer level: Needs assistance Equipment used: Rolling walker (2 wheeled) Transfers: Sit to/from Stand Sit to Stand: Supervision         General transfer comment: poor use of hands with stand to sit.  Ambulation/Gait Ambulation/Gait assistance: Min guard Ambulation Distance (Feet): 250 Feet Assistive device: Rolling walker (2 wheeled) Gait Pattern/deviations: Step-through pattern   Gait velocity interpretation: at or above normal speed for age/gender General Gait Details: Pt with no LOB noted and no SOB.  Pt did report back felt a bit better after ambulation.  Stairs            Wheelchair Mobility    Modified Rankin (Stroke Patients Only)       Balance Overall balance assessment: No apparent balance deficits (not formally assessed)                                           Pertinent Vitals/Pain Pain Assessment: 0-10 Pain Score: 7  Pain Descriptors / Indicators: Sore Pain Intervention(s): Premedicated before session Decreased pain after gait.    Home Living Family/patient expects to be discharged to:: Assisted living               Home Equipment: Walker - 2 wheels;Grab bars - toilet;Grab bars - tub/shower;Shower seat - built in;Hand held  shower head      Prior Function Level of Independence: Independent with assistive device(s)               Hand Dominance   Dominant Hand: Right    Extremity/Trunk Assessment   Upper Extremity Assessment: Defer to OT evaluation           Lower Extremity Assessment: Overall WFL for tasks assessed         Communication   Communication: No difficulties  Cognition Arousal/Alertness: Awake/alert Behavior During Therapy: WFL for tasks assessed/performed Overall Cognitive Status: Within Functional Limits for tasks assessed                       General Comments      Exercises        Assessment/Plan    PT Assessment Patient needs continued PT services  PT Diagnosis Difficulty walking   PT Problem List Decreased mobility  PT Treatment Interventions Gait training   PT Goals (Current goals can be found in the Care Plan section) Acute Rehab PT Goals Patient Stated Goal: Go home today PT Goal Formulation: With patient Time For Goal Achievement: 02/27/14 Potential to Achieve Goals: Good    Frequency Min 3X/week   Barriers to discharge        Co-evaluation               End of Session Equipment Utilized During Treatment: Gait belt Activity Tolerance: Patient tolerated treatment well Patient left: in chair;with call bell/phone within reach Nurse Communication: Mobility status         Time: 1610-9604 PT Time Calculation (min): 27 min   Charges:   PT Evaluation $Initial PT Evaluation Tier I: 1 Procedure PT Treatments $Gait Training: 8-22 mins   PT G Codes:          Lance Kemp 02/20/2014, 10:00 AM

## 2014-02-20 NOTE — Clinical Social Work Note (Signed)
CSW confirmed pt is able to discharge back to [facility]) today, 02/20/2014. CSW completed discharge packet and placed on pt's shadow chart. CSW confirmed with RN regarding discharge. Per RN, pt's son to provide transportation for pt at approximately 3pm on 02/20/2014. CSW has left message with pt's son, Javarion Douty ([phone]), regarding discharge. CSW informed RN pt's son to transport discharge packet with pt at time of discharge. CSW has completed EMS (PTAR) form in the event pt requires EMS (PTAR, [phone]) transportation.  Lubertha Sayres, MSW, [facility] Licensed Clinical Social Worker [phone] and [phone]

## 2014-02-20 NOTE — ED Provider Notes (Signed)
Medical screening examination/treatment/procedure(s) were performed by non-physician practitioner and as supervising physician I was immediately available for consultation/collaboration.   EKG Interpretation   Date/Time:  Wednesday February 18 2014 16:41:30 EDT Ventricular Rate:  82 PR Interval:  137 QRS Duration: 92 QT Interval:  385 QTC Calculation: 450 R Axis:   -31 Text Interpretation:  Sinus rhythm Probable left atrial enlargement  Abnormal R-wave progression, early transition Left ventricular hypertrophy  Artifact When compared with ECG of 12/17/2013 No significant change was  found Confirmed by [facility]  MD, Leiann Sporer ([id number]) on 02/18/2014 4:49:31 PM        Francine Graven, DO 02/20/14 1958

## 2014-02-20 NOTE — Discharge Instructions (Signed)
Follow with Primary MD MCGOWEN,PHILIP H, MD in 7 days   Get CBC, CMP, 2 view Chest X ray checked  by Primary MD next visit.    Activity: As tolerated with Full fall precautions use walker/cane & assistance as needed   Disposition ALF   Diet: Heart Healthy  , feeding assistance and aspiration precautions if needed.   For Heart failure patients - Check your Weight same time everyday, if you gain over 2 pounds, or you develop in leg swelling, experience more shortness of breath or chest pain, call your Primary MD immediately. Follow Cardiac Low Salt Diet and 1.8 lit/day fluid restriction.   On your next visit with her primary care physician please Get Medicines reviewed and adjusted.  Please request your Prim.MD to go over all Hospital Tests and Procedure/Radiological results at the follow up, please get all Hospital records sent to your Prim MD by signing hospital release before you go home.   If you experience worsening of your admission symptoms, develop shortness of breath, life threatening emergency, suicidal or homicidal thoughts you must seek medical attention immediately by calling 911 or calling your MD immediately  if symptoms less severe.  You Must read complete instructions/literature along with all the possible adverse reactions/side effects for all the Medicines you take and that have been prescribed to you. Take any new Medicines after you have completely understood and accpet all the possible adverse reactions/side effects.   Do not drive, operating heavy machinery, perform activities at heights, swimming or participation in water activities or provide baby sitting services if your were admitted for syncope or siezures until you have seen by Primary MD or a Neurologist and advised to do so again.  Do not drive when taking Pain medications.    Do not take more than prescribed Pain, Sleep and Anxiety Medications  Special Instructions: If you have smoked or chewed Tobacco   in the last 2 yrs please stop smoking, stop any regular Alcohol  and or any Recreational drug use.  Wear Seat belts while driving.   Please note  You were cared for by a hospitalist during your hospital stay. If you have any questions about your discharge medications or the care you received while you were in the hospital after you are discharged, you can call the unit and asked to speak with the hospitalist on call if the hospitalist that took care of you is not available. Once you are discharged, your primary care physician will handle any further medical issues. Please note that NO REFILLS for any discharge medications will be authorized once you are discharged, as it is imperative that you return to your primary care physician (or establish a relationship with a primary care physician if you do not have one) for your aftercare needs so that they can reassess your need for medications and monitor your lab values.

## 2014-02-20 NOTE — Plan of Care (Signed)
Problem: Phase I Progression Outcomes Goal: EF % per last Echo/documented,Core Reminder form on chart Outcome: Completed/Met Date Met:  02/20/14 EF 55-60% per echo on 12/18/13

## 2014-02-24 ENCOUNTER — Ambulatory Visit: Payer: Medicare Other | Admitting: Family Medicine

## 2014-02-24 DIAGNOSIS — M545 Low back pain, unspecified: Secondary | ICD-10-CM | POA: Diagnosis not present

## 2014-02-24 DIAGNOSIS — M19049 Primary osteoarthritis, unspecified hand: Secondary | ICD-10-CM | POA: Diagnosis not present

## 2014-02-24 DIAGNOSIS — I129 Hypertensive chronic kidney disease with stage 1 through stage 4 chronic kidney disease, or unspecified chronic kidney disease: Secondary | ICD-10-CM | POA: Diagnosis not present

## 2014-02-24 DIAGNOSIS — R278 Other lack of coordination: Secondary | ICD-10-CM | POA: Diagnosis not present

## 2014-02-24 DIAGNOSIS — M6281 Muscle weakness (generalized): Secondary | ICD-10-CM | POA: Diagnosis not present

## 2014-02-24 DIAGNOSIS — M47817 Spondylosis without myelopathy or radiculopathy, lumbosacral region: Secondary | ICD-10-CM | POA: Diagnosis not present

## 2014-02-24 DIAGNOSIS — M25519 Pain in unspecified shoulder: Secondary | ICD-10-CM | POA: Diagnosis not present

## 2014-02-24 DIAGNOSIS — I70209 Unspecified atherosclerosis of native arteries of extremities, unspecified extremity: Secondary | ICD-10-CM | POA: Diagnosis not present

## 2014-02-24 DIAGNOSIS — N183 Chronic kidney disease, stage 3 (moderate): Secondary | ICD-10-CM | POA: Diagnosis not present

## 2014-02-24 DIAGNOSIS — I890 Lymphedema, not elsewhere classified: Secondary | ICD-10-CM | POA: Diagnosis not present

## 2014-02-24 DIAGNOSIS — M549 Dorsalgia, unspecified: Secondary | ICD-10-CM | POA: Diagnosis not present

## 2014-02-24 DIAGNOSIS — G609 Hereditary and idiopathic neuropathy, unspecified: Secondary | ICD-10-CM | POA: Diagnosis not present

## 2014-02-24 DIAGNOSIS — I5043 Acute on chronic combined systolic (congestive) and diastolic (congestive) heart failure: Secondary | ICD-10-CM | POA: Diagnosis not present

## 2014-02-24 DIAGNOSIS — G8929 Other chronic pain: Secondary | ICD-10-CM | POA: Diagnosis not present

## 2014-02-24 DIAGNOSIS — H409 Unspecified glaucoma: Secondary | ICD-10-CM | POA: Diagnosis not present

## 2014-02-25 ENCOUNTER — Encounter: Payer: Self-pay | Admitting: Family Medicine

## 2014-02-25 ENCOUNTER — Ambulatory Visit (INDEPENDENT_AMBULATORY_CARE_PROVIDER_SITE_OTHER): Payer: Medicare Other | Admitting: Family Medicine

## 2014-02-25 VITALS — BP 132/71 | HR 83 | Temp 98.0°F | Resp 18 | Ht 65.0 in | Wt 171.0 lb

## 2014-02-25 DIAGNOSIS — M19049 Primary osteoarthritis, unspecified hand: Secondary | ICD-10-CM | POA: Diagnosis not present

## 2014-02-25 DIAGNOSIS — I5043 Acute on chronic combined systolic (congestive) and diastolic (congestive) heart failure: Secondary | ICD-10-CM | POA: Diagnosis not present

## 2014-02-25 DIAGNOSIS — I129 Hypertensive chronic kidney disease with stage 1 through stage 4 chronic kidney disease, or unspecified chronic kidney disease: Secondary | ICD-10-CM | POA: Diagnosis not present

## 2014-02-25 DIAGNOSIS — R609 Edema, unspecified: Secondary | ICD-10-CM

## 2014-02-25 DIAGNOSIS — G609 Hereditary and idiopathic neuropathy, unspecified: Secondary | ICD-10-CM | POA: Diagnosis not present

## 2014-02-25 DIAGNOSIS — G8929 Other chronic pain: Secondary | ICD-10-CM | POA: Diagnosis not present

## 2014-02-25 DIAGNOSIS — M25519 Pain in unspecified shoulder: Secondary | ICD-10-CM

## 2014-02-25 DIAGNOSIS — I5033 Acute on chronic diastolic (congestive) heart failure: Secondary | ICD-10-CM

## 2014-02-25 DIAGNOSIS — R0902 Hypoxemia: Secondary | ICD-10-CM | POA: Diagnosis not present

## 2014-02-25 DIAGNOSIS — I10 Essential (primary) hypertension: Secondary | ICD-10-CM | POA: Diagnosis not present

## 2014-02-25 DIAGNOSIS — N183 Chronic kidney disease, stage 3 unspecified: Secondary | ICD-10-CM

## 2014-02-25 DIAGNOSIS — M25512 Pain in left shoulder: Secondary | ICD-10-CM

## 2014-02-25 LAB — BASIC METABOLIC PANEL
BUN: 24 mg/dL — ABNORMAL HIGH (ref 6–23)
CHLORIDE: 103 meq/L (ref 96–112)
CO2: 28 meq/L (ref 19–32)
Calcium: 9.1 mg/dL (ref 8.4–10.5)
Creatinine, Ser: 1.3 mg/dL (ref 0.4–1.5)
GFR: 53.81 mL/min — ABNORMAL LOW (ref >60.00)
Glucose, Bld: 87 mg/dL (ref 70–99)
POTASSIUM: 4.1 meq/L (ref 3.5–5.1)
SODIUM: 138 meq/L (ref 135–145)

## 2014-02-25 NOTE — Patient Instructions (Signed)
After your labs return later this evening I'll decide on how much to increase your diuretic.

## 2014-02-25 NOTE — Progress Notes (Signed)
OFFICE VISIT  02/28/2014   CC:  Chief Complaint  Patient presents with  . Hospitalization Follow-up   HPI:    Patient is a 80 y.o. Caucasian male who presents for 5 d f/u s/p hospitalization for hypoxia secondary to acute-on-chronic diastolic CHF.  Hospitalized 9/2-9/4, 2015. Has continued lasix post-hosp at 40mg  po qd but diovan being held due to Cr bump. Feeling fine regarding breathing/chest.  LE swelling is much more of a concern since d/c home from hosp: wt is up 8 lbs from d/c wt of 163 lbs.  Past Medical History  Diagnosis Date  . Osteoarthritis     hands/wrists  . Depression     antidepressant x 1 yr--situational. Resolved 08/2013 per pt/son.  . Glaucoma   . Hypertension   . 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  No significant residual deficit except short term memory impairment  . Urine incontinence   . Frequent headaches   . Chronic venous insufficiency     compression hose  . Peripheral neuropathy 2013    Dx'd by neuro in [place] at the time the patient was hospitalized briefly for [place] CVA.  . NSTEMI (non-ST elevated myocardial infarction) 06/2011    [place]: Troponin mildly elevated, normal EKG, normal ECHO, pt declined cardiology consult--per old PCP records.  . Basal cell carcinoma 2011    Face  . Chronic renal insufficiency, stage III (moderate) 2013    CrCl in the 50s  . Chronic diastolic heart failure   . Chronic left shoulder pain 09/14/2013  . Normocytic anemia 2015    secondary to CRI (iron studies ok 12/2013)  . Shortness of breath     Past Surgical History  Procedure Laterality Date  . Cholecystectomy    . Tonsilectomy, adenoidectomy, bilateral myringotomy and tubes    . Transthoracic echocardiogram  12/18/13    Grade 1 diast dysf, o/w normal    Outpatient Prescriptions Prior to Visit  Medication Sig Dispense Refill  . aspirin 81 MG chewable tablet Chew 81 mg by mouth daily.      Marland Kitchen atorvastatin (LIPITOR) 20 MG tablet Take 20 mg by  mouth every evening.       Marland Kitchen azelastine (OPTIVAR) 0.05 % ophthalmic solution Place 1 drop into the right eye 2 (two) times daily.       . baclofen (LIORESAL) 10 MG tablet Take 10 mg by mouth 3 (three) times daily as needed for muscle spasms.       Marland Kitchen BETOPTIC-S 0.25 % ophthalmic suspension Place 1 drop into the right eye daily.       . finasteride (PROSCAR) 5 MG tablet Take 1 tablet (5 mg total) by mouth daily.  30 tablet  6  . furosemide (LASIX) 40 MG tablet Take 40 mg by mouth daily.      . hydrALAZINE (APRESOLINE) 10 MG tablet Take 10 mg by mouth 3 (three) times daily.       Marland Kitchen HYDROcodone-acetaminophen (NORCO/VICODIN) 5-325 MG per tablet Take 1 tablet by mouth every 6 (six) hours as needed for severe pain.  15 tablet  0  . meclizine (ANTIVERT) 25 MG tablet Take 12.5 mg by mouth 2 (two) times daily as needed for dizziness.      . tamsulosin (FLOMAX) 0.4 MG CAPS capsule Take 2 capsules (0.8 mg total) by mouth daily after supper.  60 capsule  6  . temazepam (RESTORIL) 7.5 MG capsule Take 1 capsule (7.5 mg total) by mouth at bedtime as needed for sleep.  30 capsule  5  . traMADol (ULTRAM) 50 MG tablet Take 0.5 tablets (25 mg total) by mouth every 12 (twelve) hours as needed for moderate pain.  30 tablet  1   No facility-administered medications prior to visit.    Allergies  Allergen Reactions  . Altace [Ramipril] Other (See Comments)    As per old records from [place]  . Amoxicillin Other (See Comments)    Per old records from [place]  . Cozaar [Losartan Potassium] Other (See Comments)    As per old records from [place]    ROS As per HPI  PE: Blood pressure 132/71, pulse 83, temperature 98 F (36.7 C), temperature source Oral, resp. rate 18, height 5\' 5"  (1.651 m), weight 171 lb (77.565 kg), SpO2 93.00%. Gen: Alert, well appearing.  Patient is oriented to person, place, time, and situation. AFFECT: pleasant, lucid thought and speech. Chest: RRR, distant S1 and S2, no murmur or  rub. LUNGS: CTA bilat except for soft bibasilar insp crackles.  Exp phase not prolonged, no wheezing.  Aeration is good. EXT: no clubbing or cyanosis.  Bilat 4+ pitting edema in LL's, skin tight, with a bit of clear fluid weeping from skin of left pretibial area.  No erythema or tenderness.  He has a few 2-3 mm very superficial oval ulcerated areas of skin breakdown in the weeping areas.  No warmth.  LABS:  None today  IMPRESSION AND PLAN:  Acute on chronic diastolic heart failure Resolved. Continue current diuresis but check BMET and if Cr ok will have low threshold to increase diuresis in order to try to decrease LE edema/weaping.  HTN (hypertension), benign BP normal.   Continue to hold diovan, esp with CRI and plans/intent to increase diuresis.  Hypoxia Secondary to acute-on-chronic diastolic CHF--now resolved s/p diuresis in hosp. Bibasilar crackles on exam but pt with no signs of respiratory distress and is oxygenating well on RA.  Chronic renal insufficiency, stage III (moderate) Check BMET today. May have to accept a bit of a baseline drop in Cr in order to diurese enough to get LE edema down enough to prevent skin breakdown.  Chronic shoulder pain Tylenol prn. Says tramadol makes him groggy "all day after I get it", but he has no other option for stronger pain med when his shoulder really starts hurting. Avoid NSAIDs due to renal insufficiency.  An After Visit Summary was printed and given to the patient.  FOLLOW UP: Return in about 5 days (around 03/02/2014) for f/u LE swelling.  ADDENDUM 02/25/14:  BMET results from today's visit:   Chemistry      Component Value Date/Time   NA 138 02/25/2014 1152   K 4.1 02/25/2014 1152   CL 103 02/25/2014 1152   CO2 28 02/25/2014 1152   BUN 24* 02/25/2014 1152   CREATININE 1.3 02/25/2014 1152   CREATININE 1.44* 01/16/2014 1658      Component Value Date/Time   CALCIUM 9.1 02/25/2014 1152   ALKPHOS 78 02/19/2014 0909   AST 20 02/19/2014 0909    ALT 17 02/19/2014 0909   BILITOT 0.4 02/19/2014 0909     Will increase diuresis to 80mg  qd x 4d. If LE edema not improved enough for the weeping skin to abate then I will try to get some UNA BOOTS set up for him.--PM

## 2014-02-25 NOTE — Telephone Encounter (Signed)
Patient came in for hospital fu visit today

## 2014-02-25 NOTE — Progress Notes (Signed)
Pre visit review using our clinic review tool, if applicable. No additional management support is needed unless otherwise documented below in the visit note. 

## 2014-02-26 DIAGNOSIS — G8929 Other chronic pain: Secondary | ICD-10-CM | POA: Diagnosis not present

## 2014-02-26 DIAGNOSIS — G609 Hereditary and idiopathic neuropathy, unspecified: Secondary | ICD-10-CM | POA: Diagnosis not present

## 2014-02-26 DIAGNOSIS — I129 Hypertensive chronic kidney disease with stage 1 through stage 4 chronic kidney disease, or unspecified chronic kidney disease: Secondary | ICD-10-CM | POA: Diagnosis not present

## 2014-02-26 DIAGNOSIS — M25519 Pain in unspecified shoulder: Secondary | ICD-10-CM | POA: Diagnosis not present

## 2014-02-26 DIAGNOSIS — I5043 Acute on chronic combined systolic (congestive) and diastolic (congestive) heart failure: Secondary | ICD-10-CM | POA: Diagnosis not present

## 2014-02-26 DIAGNOSIS — M19049 Primary osteoarthritis, unspecified hand: Secondary | ICD-10-CM | POA: Diagnosis not present

## 2014-02-27 DIAGNOSIS — I129 Hypertensive chronic kidney disease with stage 1 through stage 4 chronic kidney disease, or unspecified chronic kidney disease: Secondary | ICD-10-CM | POA: Diagnosis not present

## 2014-02-27 DIAGNOSIS — G609 Hereditary and idiopathic neuropathy, unspecified: Secondary | ICD-10-CM | POA: Diagnosis not present

## 2014-02-27 DIAGNOSIS — I5043 Acute on chronic combined systolic (congestive) and diastolic (congestive) heart failure: Secondary | ICD-10-CM | POA: Diagnosis not present

## 2014-02-27 DIAGNOSIS — M19049 Primary osteoarthritis, unspecified hand: Secondary | ICD-10-CM | POA: Diagnosis not present

## 2014-02-27 DIAGNOSIS — G8929 Other chronic pain: Secondary | ICD-10-CM | POA: Diagnosis not present

## 2014-02-27 DIAGNOSIS — M25519 Pain in unspecified shoulder: Secondary | ICD-10-CM | POA: Diagnosis not present

## 2014-02-28 DIAGNOSIS — N183 Chronic kidney disease, stage 3 unspecified: Secondary | ICD-10-CM | POA: Insufficient documentation

## 2014-02-28 DIAGNOSIS — N2889 Other specified disorders of kidney and ureter: Secondary | ICD-10-CM | POA: Insufficient documentation

## 2014-02-28 NOTE — Assessment & Plan Note (Signed)
Resolved. Continue current diuresis but check BMET and if Cr ok will have low threshold to increase diuresis in order to try to decrease LE edema/weaping.

## 2014-02-28 NOTE — Assessment & Plan Note (Signed)
Secondary to acute-on-chronic diastolic CHF--now resolved s/p diuresis in hosp. Bibasilar crackles on exam but pt with no signs of respiratory distress and is oxygenating well on RA.

## 2014-02-28 NOTE — Assessment & Plan Note (Signed)
BP normal.   Continue to hold diovan, esp with CRI and plans/intent to increase diuresis.

## 2014-02-28 NOTE — Assessment & Plan Note (Signed)
Tylenol prn. Says tramadol makes him groggy "all day after I get it", but he has no other option for stronger pain med when his shoulder really starts hurting. Avoid NSAIDs due to renal insufficiency.

## 2014-02-28 NOTE — Assessment & Plan Note (Signed)
Check BMET today. May have to accept a bit of a baseline drop in Cr in order to diurese enough to get LE edema down enough to prevent skin breakdown.

## 2014-03-03 DIAGNOSIS — G609 Hereditary and idiopathic neuropathy, unspecified: Secondary | ICD-10-CM | POA: Diagnosis not present

## 2014-03-03 DIAGNOSIS — I5043 Acute on chronic combined systolic (congestive) and diastolic (congestive) heart failure: Secondary | ICD-10-CM | POA: Diagnosis not present

## 2014-03-03 DIAGNOSIS — M19049 Primary osteoarthritis, unspecified hand: Secondary | ICD-10-CM | POA: Diagnosis not present

## 2014-03-03 DIAGNOSIS — M25519 Pain in unspecified shoulder: Secondary | ICD-10-CM | POA: Diagnosis not present

## 2014-03-03 DIAGNOSIS — G8929 Other chronic pain: Secondary | ICD-10-CM | POA: Diagnosis not present

## 2014-03-03 DIAGNOSIS — I129 Hypertensive chronic kidney disease with stage 1 through stage 4 chronic kidney disease, or unspecified chronic kidney disease: Secondary | ICD-10-CM | POA: Diagnosis not present

## 2014-03-04 ENCOUNTER — Encounter: Payer: Self-pay | Admitting: Family Medicine

## 2014-03-04 ENCOUNTER — Ambulatory Visit (INDEPENDENT_AMBULATORY_CARE_PROVIDER_SITE_OTHER): Payer: Medicare Other | Admitting: Family Medicine

## 2014-03-04 VITALS — BP 130/67 | HR 83 | Temp 98.5°F | Resp 16 | Ht 65.0 in | Wt 170.0 lb

## 2014-03-04 DIAGNOSIS — N183 Chronic kidney disease, stage 3 unspecified: Secondary | ICD-10-CM | POA: Diagnosis not present

## 2014-03-04 DIAGNOSIS — I5032 Chronic diastolic (congestive) heart failure: Secondary | ICD-10-CM | POA: Diagnosis not present

## 2014-03-04 DIAGNOSIS — G8929 Other chronic pain: Secondary | ICD-10-CM | POA: Diagnosis not present

## 2014-03-04 DIAGNOSIS — R238 Other skin changes: Secondary | ICD-10-CM

## 2014-03-04 DIAGNOSIS — G609 Hereditary and idiopathic neuropathy, unspecified: Secondary | ICD-10-CM | POA: Diagnosis not present

## 2014-03-04 DIAGNOSIS — I872 Venous insufficiency (chronic) (peripheral): Secondary | ICD-10-CM

## 2014-03-04 DIAGNOSIS — M19049 Primary osteoarthritis, unspecified hand: Secondary | ICD-10-CM | POA: Diagnosis not present

## 2014-03-04 DIAGNOSIS — L989 Disorder of the skin and subcutaneous tissue, unspecified: Secondary | ICD-10-CM

## 2014-03-04 DIAGNOSIS — M25519 Pain in unspecified shoulder: Secondary | ICD-10-CM | POA: Diagnosis not present

## 2014-03-04 DIAGNOSIS — I5043 Acute on chronic combined systolic (congestive) and diastolic (congestive) heart failure: Secondary | ICD-10-CM | POA: Diagnosis not present

## 2014-03-04 DIAGNOSIS — I129 Hypertensive chronic kidney disease with stage 1 through stage 4 chronic kidney disease, or unspecified chronic kidney disease: Secondary | ICD-10-CM | POA: Diagnosis not present

## 2014-03-04 NOTE — Progress Notes (Signed)
Pre visit review using our clinic review tool, if applicable. No additional management support is needed unless otherwise documented below in the visit note. 

## 2014-03-04 NOTE — Progress Notes (Signed)
OFFICE NOTE  03/04/2014  CC:  Chief Complaint  Patient presents with  . Follow-up     HPI: Patient is a 80 y.o. Caucasian male who is here for f/u LE edema, CRI. Increased diuresis x 4d (80mg  qd instead of 40mg  qd) due to increased LE edema that was causing mild superficial skin breakdown and weeping in left lower leg. His LE swelling is slightly improved.  No SOB.  He has nocturia that is no worse on the double dose of lasix compared to 40mg  qd. No fever, no leg pains.  +Wearing compression stockings intermittently.  Low Na diet is nearly impossible due to living in ALF.  Pertinent PMH:  Past medical, surgical, social, and family history reviewed and no changes are noted since last office visit.  MEDS:  Outpatient Prescriptions Prior to Visit  Medication Sig Dispense Refill  . aspirin 81 MG chewable tablet Chew 81 mg by mouth daily.      Marland Kitchen atorvastatin (LIPITOR) 20 MG tablet Take 20 mg by mouth every evening.       Marland Kitchen azelastine (OPTIVAR) 0.05 % ophthalmic solution Place 1 drop into the right eye 2 (two) times daily.       . baclofen (LIORESAL) 10 MG tablet Take 10 mg by mouth 3 (three) times daily as needed for muscle spasms.       Marland Kitchen BETOPTIC-S 0.25 % ophthalmic suspension Place 1 drop into the right eye daily.       . finasteride (PROSCAR) 5 MG tablet Take 1 tablet (5 mg total) by mouth daily.  30 tablet  6  . furosemide (LASIX) 40 MG tablet Take 40 mg by mouth daily.      . hydrALAZINE (APRESOLINE) 10 MG tablet Take 10 mg by mouth 3 (three) times daily.       Marland Kitchen HYDROcodone-acetaminophen (NORCO/VICODIN) 5-325 MG per tablet Take 1 tablet by mouth every 6 (six) hours as needed for severe pain.  15 tablet  0  . meclizine (ANTIVERT) 25 MG tablet Take 12.5 mg by mouth 2 (two) times daily as needed for dizziness.      . tamsulosin (FLOMAX) 0.4 MG CAPS capsule Take 2 capsules (0.8 mg total) by mouth daily after supper.  60 capsule  6  . temazepam (RESTORIL) 7.5 MG capsule Take 1 capsule  (7.5 mg total) by mouth at bedtime as needed for sleep.  30 capsule  5  . traMADol (ULTRAM) 50 MG tablet Take 0.5 tablets (25 mg total) by mouth every 12 (twelve) hours as needed for moderate pain.  30 tablet  1   No facility-administered medications prior to visit.    PE: Blood pressure 130/67, pulse 83, temperature 98.5 F (36.9 C), temperature source Temporal, resp. rate 16, height 5\' 5"  (1.651 m), weight 170 lb (77.111 kg), SpO2 97.00%. Gen: Alert, well appearing.  Patient is oriented to person, place, time, and situation. CV: RRR LUNGS: Bibasilar soft insp crackles in early inspiration primarily.  No wheezing.  Aeration is good.  Breathing is nonlabored. EXT: Below knees he has 3-4+ pitting edema bilat, with only 3 small areas of weeping in left pretibial region w/out erythema or streaking or foul odor.  Calf circ 5 inches below patella is 15 and 1/2 inches on each side (symmetric).  IMPRESSION AND PLAN:  LE venous insufficiency, chronic diastolic heart failure:  Doing better on 80mg  qd lasix, but we'll have to see how his BUN/Cr and electrolytes have been affected. BMET and mag today.  An  After Visit Summary was printed and given to the patient.  FOLLOW UP: 7d

## 2014-03-05 LAB — BASIC METABOLIC PANEL
BUN: 22 mg/dL (ref 6–23)
CHLORIDE: 102 meq/L (ref 96–112)
CO2: 29 mEq/L (ref 19–32)
Calcium: 8.8 mg/dL (ref 8.4–10.5)
Creatinine, Ser: 1.2 mg/dL (ref 0.4–1.5)
GFR: 58.88 mL/min — ABNORMAL LOW (ref >60.00)
Glucose, Bld: 129 mg/dL — ABNORMAL HIGH (ref 70–99)
Potassium: 4.1 mEq/L (ref 3.5–5.1)
SODIUM: 138 meq/L (ref 135–145)

## 2014-03-05 LAB — MAGNESIUM: Magnesium: 2.1 mg/dL (ref 1.5–2.5)

## 2014-03-06 DIAGNOSIS — G609 Hereditary and idiopathic neuropathy, unspecified: Secondary | ICD-10-CM | POA: Diagnosis not present

## 2014-03-06 DIAGNOSIS — M19049 Primary osteoarthritis, unspecified hand: Secondary | ICD-10-CM | POA: Diagnosis not present

## 2014-03-06 DIAGNOSIS — I129 Hypertensive chronic kidney disease with stage 1 through stage 4 chronic kidney disease, or unspecified chronic kidney disease: Secondary | ICD-10-CM | POA: Diagnosis not present

## 2014-03-06 DIAGNOSIS — I5043 Acute on chronic combined systolic (congestive) and diastolic (congestive) heart failure: Secondary | ICD-10-CM | POA: Diagnosis not present

## 2014-03-06 DIAGNOSIS — G8929 Other chronic pain: Secondary | ICD-10-CM | POA: Diagnosis not present

## 2014-03-06 DIAGNOSIS — M25519 Pain in unspecified shoulder: Secondary | ICD-10-CM | POA: Diagnosis not present

## 2014-03-09 DIAGNOSIS — I129 Hypertensive chronic kidney disease with stage 1 through stage 4 chronic kidney disease, or unspecified chronic kidney disease: Secondary | ICD-10-CM | POA: Diagnosis not present

## 2014-03-09 DIAGNOSIS — M25519 Pain in unspecified shoulder: Secondary | ICD-10-CM | POA: Diagnosis not present

## 2014-03-09 DIAGNOSIS — G8929 Other chronic pain: Secondary | ICD-10-CM | POA: Diagnosis not present

## 2014-03-09 DIAGNOSIS — G609 Hereditary and idiopathic neuropathy, unspecified: Secondary | ICD-10-CM | POA: Diagnosis not present

## 2014-03-09 DIAGNOSIS — M19049 Primary osteoarthritis, unspecified hand: Secondary | ICD-10-CM | POA: Diagnosis not present

## 2014-03-09 DIAGNOSIS — I5043 Acute on chronic combined systolic (congestive) and diastolic (congestive) heart failure: Secondary | ICD-10-CM | POA: Diagnosis not present

## 2014-03-10 DIAGNOSIS — I129 Hypertensive chronic kidney disease with stage 1 through stage 4 chronic kidney disease, or unspecified chronic kidney disease: Secondary | ICD-10-CM | POA: Diagnosis not present

## 2014-03-10 DIAGNOSIS — I5043 Acute on chronic combined systolic (congestive) and diastolic (congestive) heart failure: Secondary | ICD-10-CM | POA: Diagnosis not present

## 2014-03-10 DIAGNOSIS — M25519 Pain in unspecified shoulder: Secondary | ICD-10-CM

## 2014-03-10 DIAGNOSIS — G8929 Other chronic pain: Secondary | ICD-10-CM | POA: Diagnosis not present

## 2014-03-10 DIAGNOSIS — G609 Hereditary and idiopathic neuropathy, unspecified: Secondary | ICD-10-CM | POA: Diagnosis not present

## 2014-03-10 DIAGNOSIS — M19049 Primary osteoarthritis, unspecified hand: Secondary | ICD-10-CM | POA: Diagnosis not present

## 2014-03-11 ENCOUNTER — Ambulatory Visit: Payer: Medicare Other | Admitting: Family Medicine

## 2014-03-12 ENCOUNTER — Telehealth: Payer: Self-pay

## 2014-03-12 DIAGNOSIS — I5043 Acute on chronic combined systolic (congestive) and diastolic (congestive) heart failure: Secondary | ICD-10-CM | POA: Diagnosis not present

## 2014-03-12 DIAGNOSIS — M19049 Primary osteoarthritis, unspecified hand: Secondary | ICD-10-CM | POA: Diagnosis not present

## 2014-03-12 DIAGNOSIS — G609 Hereditary and idiopathic neuropathy, unspecified: Secondary | ICD-10-CM | POA: Diagnosis not present

## 2014-03-12 DIAGNOSIS — M25519 Pain in unspecified shoulder: Secondary | ICD-10-CM | POA: Diagnosis not present

## 2014-03-12 DIAGNOSIS — G8929 Other chronic pain: Secondary | ICD-10-CM | POA: Diagnosis not present

## 2014-03-12 DIAGNOSIS — I129 Hypertensive chronic kidney disease with stage 1 through stage 4 chronic kidney disease, or unspecified chronic kidney disease: Secondary | ICD-10-CM | POA: Diagnosis not present

## 2014-03-12 NOTE — Telephone Encounter (Signed)
Rx written.  Jonelle Sidle then faxed it to [place].

## 2014-03-12 NOTE — Telephone Encounter (Signed)
Pt son called and asked if we could send an order over to [facility] to stop his dad's aspirin. He is supposed to stop the aspirin 5 days prior to him receiving his injection at his orthopedist next week.

## 2014-03-13 DIAGNOSIS — M25519 Pain in unspecified shoulder: Secondary | ICD-10-CM | POA: Diagnosis not present

## 2014-03-13 DIAGNOSIS — G8929 Other chronic pain: Secondary | ICD-10-CM | POA: Diagnosis not present

## 2014-03-13 DIAGNOSIS — M19049 Primary osteoarthritis, unspecified hand: Secondary | ICD-10-CM | POA: Diagnosis not present

## 2014-03-13 DIAGNOSIS — I129 Hypertensive chronic kidney disease with stage 1 through stage 4 chronic kidney disease, or unspecified chronic kidney disease: Secondary | ICD-10-CM | POA: Diagnosis not present

## 2014-03-13 DIAGNOSIS — G609 Hereditary and idiopathic neuropathy, unspecified: Secondary | ICD-10-CM | POA: Diagnosis not present

## 2014-03-13 DIAGNOSIS — I5043 Acute on chronic combined systolic (congestive) and diastolic (congestive) heart failure: Secondary | ICD-10-CM | POA: Diagnosis not present

## 2014-03-16 DIAGNOSIS — I129 Hypertensive chronic kidney disease with stage 1 through stage 4 chronic kidney disease, or unspecified chronic kidney disease: Secondary | ICD-10-CM | POA: Diagnosis not present

## 2014-03-16 DIAGNOSIS — M19049 Primary osteoarthritis, unspecified hand: Secondary | ICD-10-CM | POA: Diagnosis not present

## 2014-03-16 DIAGNOSIS — G8929 Other chronic pain: Secondary | ICD-10-CM | POA: Diagnosis not present

## 2014-03-16 DIAGNOSIS — G609 Hereditary and idiopathic neuropathy, unspecified: Secondary | ICD-10-CM | POA: Diagnosis not present

## 2014-03-16 DIAGNOSIS — M25519 Pain in unspecified shoulder: Secondary | ICD-10-CM | POA: Diagnosis not present

## 2014-03-16 DIAGNOSIS — I5043 Acute on chronic combined systolic (congestive) and diastolic (congestive) heart failure: Secondary | ICD-10-CM | POA: Diagnosis not present

## 2014-03-17 DIAGNOSIS — I5043 Acute on chronic combined systolic (congestive) and diastolic (congestive) heart failure: Secondary | ICD-10-CM | POA: Diagnosis not present

## 2014-03-17 DIAGNOSIS — G8929 Other chronic pain: Secondary | ICD-10-CM | POA: Diagnosis not present

## 2014-03-17 DIAGNOSIS — G609 Hereditary and idiopathic neuropathy, unspecified: Secondary | ICD-10-CM | POA: Diagnosis not present

## 2014-03-17 DIAGNOSIS — I129 Hypertensive chronic kidney disease with stage 1 through stage 4 chronic kidney disease, or unspecified chronic kidney disease: Secondary | ICD-10-CM | POA: Diagnosis not present

## 2014-03-17 DIAGNOSIS — M25519 Pain in unspecified shoulder: Secondary | ICD-10-CM | POA: Diagnosis not present

## 2014-03-17 DIAGNOSIS — M19049 Primary osteoarthritis, unspecified hand: Secondary | ICD-10-CM | POA: Diagnosis not present

## 2014-03-18 DIAGNOSIS — M545 Low back pain, unspecified: Secondary | ICD-10-CM | POA: Diagnosis not present

## 2014-03-19 ENCOUNTER — Encounter: Payer: Self-pay | Admitting: Family Medicine

## 2014-03-19 ENCOUNTER — Ambulatory Visit (INDEPENDENT_AMBULATORY_CARE_PROVIDER_SITE_OTHER): Payer: Medicare Other | Admitting: Family Medicine

## 2014-03-19 VITALS — BP 170/77 | HR 78 | Temp 98.1°F | Resp 18 | Ht 65.0 in | Wt 167.0 lb

## 2014-03-19 DIAGNOSIS — N183 Chronic kidney disease, stage 3 unspecified: Secondary | ICD-10-CM

## 2014-03-19 DIAGNOSIS — I129 Hypertensive chronic kidney disease with stage 1 through stage 4 chronic kidney disease, or unspecified chronic kidney disease: Secondary | ICD-10-CM | POA: Diagnosis not present

## 2014-03-19 DIAGNOSIS — G8929 Other chronic pain: Secondary | ICD-10-CM | POA: Diagnosis not present

## 2014-03-19 DIAGNOSIS — I5043 Acute on chronic combined systolic (congestive) and diastolic (congestive) heart failure: Secondary | ICD-10-CM | POA: Diagnosis not present

## 2014-03-19 DIAGNOSIS — I872 Venous insufficiency (chronic) (peripheral): Secondary | ICD-10-CM | POA: Diagnosis not present

## 2014-03-19 DIAGNOSIS — M25519 Pain in unspecified shoulder: Secondary | ICD-10-CM | POA: Diagnosis not present

## 2014-03-19 DIAGNOSIS — G609 Hereditary and idiopathic neuropathy, unspecified: Secondary | ICD-10-CM | POA: Diagnosis not present

## 2014-03-19 DIAGNOSIS — R739 Hyperglycemia, unspecified: Secondary | ICD-10-CM

## 2014-03-19 DIAGNOSIS — M19049 Primary osteoarthritis, unspecified hand: Secondary | ICD-10-CM | POA: Diagnosis not present

## 2014-03-19 DIAGNOSIS — R600 Localized edema: Secondary | ICD-10-CM

## 2014-03-19 LAB — BASIC METABOLIC PANEL
BUN: 23 mg/dL (ref 6–23)
CHLORIDE: 102 meq/L (ref 96–112)
CO2: 31 mEq/L (ref 19–32)
Calcium: 9 mg/dL (ref 8.4–10.5)
Creatinine, Ser: 1.1 mg/dL (ref 0.4–1.5)
GFR: 65.6 mL/min (ref >60.00)
GLUCOSE: 122 mg/dL — AB (ref 70–99)
POTASSIUM: 4.2 meq/L (ref 3.5–5.1)
SODIUM: 139 meq/L (ref 135–145)

## 2014-03-19 LAB — HEMOGLOBIN A1C: Hgb A1c MFr Bld: 5.9 % (ref 4.6–6.5)

## 2014-03-19 NOTE — Progress Notes (Signed)
Pre visit review using our clinic review tool, if applicable. No additional management support is needed unless otherwise documented below in the visit note. 

## 2014-03-19 NOTE — Progress Notes (Signed)
OFFICE NOTE  03/19/2014  CC:  Chief Complaint  Patient presents with  . Follow-up   HPI: Patient is a 80 y.o. Caucasian male who is here for 2 week f/u chronic LE edema secondary to venous insufficiency and chronic diastolic HF. I changed his daily lasix dosing to 80mg  qd b/c his BUN/Cr and lytes had been keeping stable on this dose with close f/u of labs. He got a steroid injection in his lower back by Dr. Nelva Bush yesterday and he reports his pain is much improved!  His leg swelling is improved, no weeping anymore, wt is down.  No HA's, no vision c/o, no dizziness, no palpitations, no SOB, no CP.   Pertinent PMH:  Past medical, surgical, social, and family history reviewed and no changes are noted since last office visit.  MEDS:  Outpatient Prescriptions Prior to Visit  Medication Sig Dispense Refill  . aspirin 81 MG chewable tablet Chew 81 mg by mouth daily.      Marland Kitchen atorvastatin (LIPITOR) 20 MG tablet Take 20 mg by mouth every evening.       Marland Kitchen azelastine (OPTIVAR) 0.05 % ophthalmic solution Place 1 drop into the right eye 2 (two) times daily.       . baclofen (LIORESAL) 10 MG tablet Take 10 mg by mouth 3 (three) times daily as needed for muscle spasms.       Marland Kitchen BETOPTIC-S 0.25 % ophthalmic suspension Place 1 drop into the right eye daily.       . finasteride (PROSCAR) 5 MG tablet Take 1 tablet (5 mg total) by mouth daily.  30 tablet  6  . furosemide (LASIX) 40 MG tablet Take 40 mg by mouth daily.      . hydrALAZINE (APRESOLINE) 10 MG tablet Take 10 mg by mouth 3 (three) times daily.       Marland Kitchen HYDROcodone-acetaminophen (NORCO/VICODIN) 5-325 MG per tablet Take 1 tablet by mouth every 6 (six) hours as needed for severe pain.  15 tablet  0  . meclizine (ANTIVERT) 25 MG tablet Take 12.5 mg by mouth 2 (two) times daily as needed for dizziness.      . tamsulosin (FLOMAX) 0.4 MG CAPS capsule Take 2 capsules (0.8 mg total) by mouth daily after supper.  60 capsule  6  . temazepam (RESTORIL) 7.5 MG  capsule Take 1 capsule (7.5 mg total) by mouth at bedtime as needed for sleep.  30 capsule  5  . traMADol (ULTRAM) 50 MG tablet Take 0.5 tablets (25 mg total) by mouth every 12 (twelve) hours as needed for moderate pain.  30 tablet  1   No facility-administered medications prior to visit.    PE: Blood pressure 170/77, pulse 78, temperature 98.1 F (36.7 C), temperature source Oral, resp. rate 18, height 5\' 5"  (1.651 m), weight 167 lb (75.751 kg), SpO2 96.00%. Wt is down 3 lbs since last visit Gen: Alert, well appearing.  Patient is oriented to person, place, time, and situation. LE's: 3+ bilat LE pitting edema without any areas of skin breakdown or weaping.  Mild pinkish hue diffusely in anterior regions and mild warmth: no erythema or tenderness.  LAB:   Chemistry      Component Value Date/Time   NA 138 03/04/2014 1451   K 4.1 03/04/2014 1451   CL 102 03/04/2014 1451   CO2 29 03/04/2014 1451   BUN 22 03/04/2014 1451   CREATININE 1.2 03/04/2014 1451   CREATININE 1.44* 01/16/2014 1658      Component  Value Date/Time   CALCIUM 8.8 03/04/2014 1451   ALKPHOS 78 02/19/2014 0909   AST 20 02/19/2014 0909   ALT 17 02/19/2014 0909   BILITOT 0.4 02/19/2014 0909       IMPRESSION AND PLAN:  1) Bilat chronic LE edema: venous insuff + chronic diast HF.  Improved, no longer with any weeping skin areas. Continue 80mg  lasix po qd.  Recheck BMET today.  2) CRI, stage III: has been stable so far on increased dose of lasix. Bmet today.  3) Hyperglycemia: on many of his BMETs.  These have been "random" and not fasting, but will go ahead and check a Hba1c today.  4) HTN; The current medical regimen is effective;  continue present plan and medications. Recheck BP today with manual cuff was 144/72.  An After Visit Summary was printed and given to the patient.  FOLLOW UP: 4 wks

## 2014-03-20 DIAGNOSIS — I129 Hypertensive chronic kidney disease with stage 1 through stage 4 chronic kidney disease, or unspecified chronic kidney disease: Secondary | ICD-10-CM | POA: Diagnosis not present

## 2014-03-20 DIAGNOSIS — G8929 Other chronic pain: Secondary | ICD-10-CM | POA: Diagnosis not present

## 2014-03-20 DIAGNOSIS — M19049 Primary osteoarthritis, unspecified hand: Secondary | ICD-10-CM | POA: Diagnosis not present

## 2014-03-20 DIAGNOSIS — M25519 Pain in unspecified shoulder: Secondary | ICD-10-CM | POA: Diagnosis not present

## 2014-03-20 DIAGNOSIS — G609 Hereditary and idiopathic neuropathy, unspecified: Secondary | ICD-10-CM | POA: Diagnosis not present

## 2014-03-20 DIAGNOSIS — I5043 Acute on chronic combined systolic (congestive) and diastolic (congestive) heart failure: Secondary | ICD-10-CM | POA: Diagnosis not present

## 2014-03-23 DIAGNOSIS — I5043 Acute on chronic combined systolic (congestive) and diastolic (congestive) heart failure: Secondary | ICD-10-CM | POA: Diagnosis not present

## 2014-03-23 DIAGNOSIS — M19049 Primary osteoarthritis, unspecified hand: Secondary | ICD-10-CM | POA: Diagnosis not present

## 2014-03-23 DIAGNOSIS — I129 Hypertensive chronic kidney disease with stage 1 through stage 4 chronic kidney disease, or unspecified chronic kidney disease: Secondary | ICD-10-CM | POA: Diagnosis not present

## 2014-03-23 DIAGNOSIS — M25519 Pain in unspecified shoulder: Secondary | ICD-10-CM | POA: Diagnosis not present

## 2014-03-23 DIAGNOSIS — G8929 Other chronic pain: Secondary | ICD-10-CM | POA: Diagnosis not present

## 2014-03-23 DIAGNOSIS — G609 Hereditary and idiopathic neuropathy, unspecified: Secondary | ICD-10-CM | POA: Diagnosis not present

## 2014-03-25 DIAGNOSIS — I5043 Acute on chronic combined systolic (congestive) and diastolic (congestive) heart failure: Secondary | ICD-10-CM | POA: Diagnosis not present

## 2014-03-25 DIAGNOSIS — M19049 Primary osteoarthritis, unspecified hand: Secondary | ICD-10-CM | POA: Diagnosis not present

## 2014-03-25 DIAGNOSIS — G8929 Other chronic pain: Secondary | ICD-10-CM | POA: Diagnosis not present

## 2014-03-25 DIAGNOSIS — M25519 Pain in unspecified shoulder: Secondary | ICD-10-CM | POA: Diagnosis not present

## 2014-03-25 DIAGNOSIS — I129 Hypertensive chronic kidney disease with stage 1 through stage 4 chronic kidney disease, or unspecified chronic kidney disease: Secondary | ICD-10-CM | POA: Diagnosis not present

## 2014-03-25 DIAGNOSIS — G609 Hereditary and idiopathic neuropathy, unspecified: Secondary | ICD-10-CM | POA: Diagnosis not present

## 2014-03-26 DIAGNOSIS — I5043 Acute on chronic combined systolic (congestive) and diastolic (congestive) heart failure: Secondary | ICD-10-CM | POA: Diagnosis not present

## 2014-03-26 DIAGNOSIS — I129 Hypertensive chronic kidney disease with stage 1 through stage 4 chronic kidney disease, or unspecified chronic kidney disease: Secondary | ICD-10-CM | POA: Diagnosis not present

## 2014-03-26 DIAGNOSIS — G8929 Other chronic pain: Secondary | ICD-10-CM | POA: Diagnosis not present

## 2014-03-26 DIAGNOSIS — M19049 Primary osteoarthritis, unspecified hand: Secondary | ICD-10-CM | POA: Diagnosis not present

## 2014-03-26 DIAGNOSIS — M25519 Pain in unspecified shoulder: Secondary | ICD-10-CM | POA: Diagnosis not present

## 2014-03-26 DIAGNOSIS — G609 Hereditary and idiopathic neuropathy, unspecified: Secondary | ICD-10-CM | POA: Diagnosis not present

## 2014-03-30 DIAGNOSIS — G609 Hereditary and idiopathic neuropathy, unspecified: Secondary | ICD-10-CM | POA: Diagnosis not present

## 2014-03-30 DIAGNOSIS — I5043 Acute on chronic combined systolic (congestive) and diastolic (congestive) heart failure: Secondary | ICD-10-CM | POA: Diagnosis not present

## 2014-03-30 DIAGNOSIS — G8929 Other chronic pain: Secondary | ICD-10-CM | POA: Diagnosis not present

## 2014-03-30 DIAGNOSIS — M25519 Pain in unspecified shoulder: Secondary | ICD-10-CM | POA: Diagnosis not present

## 2014-03-30 DIAGNOSIS — I129 Hypertensive chronic kidney disease with stage 1 through stage 4 chronic kidney disease, or unspecified chronic kidney disease: Secondary | ICD-10-CM | POA: Diagnosis not present

## 2014-03-30 DIAGNOSIS — M19049 Primary osteoarthritis, unspecified hand: Secondary | ICD-10-CM | POA: Diagnosis not present

## 2014-04-01 DIAGNOSIS — G609 Hereditary and idiopathic neuropathy, unspecified: Secondary | ICD-10-CM | POA: Diagnosis not present

## 2014-04-01 DIAGNOSIS — G8929 Other chronic pain: Secondary | ICD-10-CM | POA: Diagnosis not present

## 2014-04-01 DIAGNOSIS — M25519 Pain in unspecified shoulder: Secondary | ICD-10-CM | POA: Diagnosis not present

## 2014-04-01 DIAGNOSIS — M19049 Primary osteoarthritis, unspecified hand: Secondary | ICD-10-CM | POA: Diagnosis not present

## 2014-04-01 DIAGNOSIS — I5043 Acute on chronic combined systolic (congestive) and diastolic (congestive) heart failure: Secondary | ICD-10-CM | POA: Diagnosis not present

## 2014-04-01 DIAGNOSIS — I129 Hypertensive chronic kidney disease with stage 1 through stage 4 chronic kidney disease, or unspecified chronic kidney disease: Secondary | ICD-10-CM | POA: Diagnosis not present

## 2014-04-02 ENCOUNTER — Telehealth: Payer: Self-pay

## 2014-04-02 DIAGNOSIS — G609 Hereditary and idiopathic neuropathy, unspecified: Secondary | ICD-10-CM | POA: Diagnosis not present

## 2014-04-02 DIAGNOSIS — M25519 Pain in unspecified shoulder: Secondary | ICD-10-CM | POA: Diagnosis not present

## 2014-04-02 DIAGNOSIS — I5043 Acute on chronic combined systolic (congestive) and diastolic (congestive) heart failure: Secondary | ICD-10-CM | POA: Diagnosis not present

## 2014-04-02 DIAGNOSIS — I129 Hypertensive chronic kidney disease with stage 1 through stage 4 chronic kidney disease, or unspecified chronic kidney disease: Secondary | ICD-10-CM | POA: Diagnosis not present

## 2014-04-02 DIAGNOSIS — M19049 Primary osteoarthritis, unspecified hand: Secondary | ICD-10-CM | POA: Diagnosis not present

## 2014-04-02 DIAGNOSIS — G8929 Other chronic pain: Secondary | ICD-10-CM | POA: Diagnosis not present

## 2014-04-02 NOTE — Telephone Encounter (Signed)
Lance Kemp from [facility] wants to get an order for Mr. Savino to receive physical therapy instead of occupational therapy by Oct 31st. She discharged him today. Please advise, verbal order ok.

## 2014-04-02 NOTE — Telephone Encounter (Signed)
Yes, verbal order for PT is ok.-thx

## 2014-04-03 NOTE — Telephone Encounter (Signed)
Spoke with Lance Kemp, advised it is ok to start PT on pt.

## 2014-04-07 DIAGNOSIS — M47816 Spondylosis without myelopathy or radiculopathy, lumbar region: Secondary | ICD-10-CM | POA: Diagnosis not present

## 2014-04-07 DIAGNOSIS — M545 Low back pain: Secondary | ICD-10-CM | POA: Diagnosis not present

## 2014-04-08 DIAGNOSIS — M19049 Primary osteoarthritis, unspecified hand: Secondary | ICD-10-CM | POA: Diagnosis not present

## 2014-04-08 DIAGNOSIS — S46012D Strain of muscle(s) and tendon(s) of the rotator cuff of left shoulder, subsequent encounter: Secondary | ICD-10-CM | POA: Diagnosis not present

## 2014-04-08 DIAGNOSIS — Z23 Encounter for immunization: Secondary | ICD-10-CM | POA: Diagnosis not present

## 2014-04-08 DIAGNOSIS — G609 Hereditary and idiopathic neuropathy, unspecified: Secondary | ICD-10-CM | POA: Diagnosis not present

## 2014-04-08 DIAGNOSIS — M25519 Pain in unspecified shoulder: Secondary | ICD-10-CM | POA: Diagnosis not present

## 2014-04-08 DIAGNOSIS — G8929 Other chronic pain: Secondary | ICD-10-CM | POA: Diagnosis not present

## 2014-04-08 DIAGNOSIS — I5043 Acute on chronic combined systolic (congestive) and diastolic (congestive) heart failure: Secondary | ICD-10-CM | POA: Diagnosis not present

## 2014-04-08 DIAGNOSIS — I129 Hypertensive chronic kidney disease with stage 1 through stage 4 chronic kidney disease, or unspecified chronic kidney disease: Secondary | ICD-10-CM | POA: Diagnosis not present

## 2014-04-09 ENCOUNTER — Telehealth: Payer: Self-pay | Admitting: *Deleted

## 2014-04-09 NOTE — Telephone Encounter (Signed)
OK to give verbal order for PT as per their request.-thx

## 2014-04-09 NOTE — Telephone Encounter (Signed)
Verbal order given for PT. 

## 2014-04-09 NOTE — Telephone Encounter (Signed)
Patient's Home [facility] called office requesting PT to work with patient. One week one and one week two. PT will called back after that if pt needs further assistance. Please advise?

## 2014-04-14 DIAGNOSIS — I5043 Acute on chronic combined systolic (congestive) and diastolic (congestive) heart failure: Secondary | ICD-10-CM | POA: Diagnosis not present

## 2014-04-14 DIAGNOSIS — M19049 Primary osteoarthritis, unspecified hand: Secondary | ICD-10-CM | POA: Diagnosis not present

## 2014-04-14 DIAGNOSIS — G8929 Other chronic pain: Secondary | ICD-10-CM | POA: Diagnosis not present

## 2014-04-14 DIAGNOSIS — G609 Hereditary and idiopathic neuropathy, unspecified: Secondary | ICD-10-CM | POA: Diagnosis not present

## 2014-04-14 DIAGNOSIS — M25519 Pain in unspecified shoulder: Secondary | ICD-10-CM | POA: Diagnosis not present

## 2014-04-14 DIAGNOSIS — I129 Hypertensive chronic kidney disease with stage 1 through stage 4 chronic kidney disease, or unspecified chronic kidney disease: Secondary | ICD-10-CM | POA: Diagnosis not present

## 2014-04-16 ENCOUNTER — Ambulatory Visit (INDEPENDENT_AMBULATORY_CARE_PROVIDER_SITE_OTHER): Payer: Medicare Other | Admitting: Family Medicine

## 2014-04-16 ENCOUNTER — Encounter: Payer: Self-pay | Admitting: Family Medicine

## 2014-04-16 VITALS — BP 140/56 | HR 65 | Temp 97.5°F | Resp 16 | Ht 65.0 in | Wt 164.0 lb

## 2014-04-16 DIAGNOSIS — Z23 Encounter for immunization: Secondary | ICD-10-CM

## 2014-04-16 DIAGNOSIS — I872 Venous insufficiency (chronic) (peripheral): Secondary | ICD-10-CM

## 2014-04-16 DIAGNOSIS — R600 Localized edema: Secondary | ICD-10-CM

## 2014-04-16 DIAGNOSIS — R609 Edema, unspecified: Secondary | ICD-10-CM | POA: Diagnosis not present

## 2014-04-16 DIAGNOSIS — R413 Other amnesia: Secondary | ICD-10-CM

## 2014-04-16 DIAGNOSIS — N189 Chronic kidney disease, unspecified: Secondary | ICD-10-CM | POA: Diagnosis not present

## 2014-04-16 DIAGNOSIS — N183 Chronic kidney disease, stage 3 unspecified: Secondary | ICD-10-CM

## 2014-04-16 LAB — BASIC METABOLIC PANEL
BUN: 29 mg/dL — ABNORMAL HIGH (ref 6–23)
CO2: 25 meq/L (ref 19–32)
CREATININE: 1.3 mg/dL (ref 0.4–1.5)
Calcium: 8.8 mg/dL (ref 8.4–10.5)
Chloride: 103 mEq/L (ref 96–112)
GFR: 53.33 mL/min — ABNORMAL LOW (ref >60.00)
GLUCOSE: 112 mg/dL — AB (ref 70–99)
Potassium: 4.8 mEq/L (ref 3.5–5.1)
Sodium: 137 mEq/L (ref 135–145)

## 2014-04-16 NOTE — Addendum Note (Signed)
Addended by: Jannette Spanner on: 04/16/2014 03:29 PM   Modules accepted: Orders

## 2014-04-16 NOTE — Progress Notes (Signed)
Pre visit review using our clinic review tool, if applicable. No additional management support is needed unless otherwise documented below in the visit note. 

## 2014-04-16 NOTE — Progress Notes (Signed)
OFFICE NOTE  04/16/2014  CC:  Chief Complaint  Patient presents with  . Follow-up   HPI: Patient is a 80 y.o. Caucasian male who is here for 1 mo f/u peripheral edema, CRI stage III, chronic diastolic CHF, hx of CVA.  Getting back injection again soon, needs order to have his aspirin stopped 5 days prior to procedure. Says tramadol causes oversedation.  Sounds like he is not requesting pain very often, so pain med not really needed. We decided today to take all rx pain killers off of his pain meds.  Says his LE swelling is stable.  No areas of focal pain or weeping.  Wears compression stockings daily.  Pertinent PMH:  Past medical, surgical, social, and family history reviewed and no changes are noted since last office visit.  MEDS:  Outpatient Prescriptions Prior to Visit  Medication Sig Dispense Refill  . aspirin 81 MG chewable tablet Chew 81 mg by mouth daily.      Marland Kitchen atorvastatin (LIPITOR) 20 MG tablet Take 20 mg by mouth every evening.       Marland Kitchen azelastine (OPTIVAR) 0.05 % ophthalmic solution Place 1 drop into the right eye 2 (two) times daily.       Marland Kitchen BETOPTIC-S 0.25 % ophthalmic suspension Place 1 drop into the right eye daily.       . baclofen (LIORESAL) 10 MG tablet Take 10 mg by mouth 3 (three) times daily as needed for muscle spasms.       . finasteride (PROSCAR) 5 MG tablet Take 1 tablet (5 mg total) by mouth daily.  30 tablet  6  . furosemide (LASIX) 40 MG tablet Take 40 mg by mouth daily.      . hydrALAZINE (APRESOLINE) 10 MG tablet Take 10 mg by mouth 3 (three) times daily.       Marland Kitchen HYDROcodone-acetaminophen (NORCO/VICODIN) 5-325 MG per tablet Take 1 tablet by mouth every 6 (six) hours as needed for severe pain.  15 tablet  0  . meclizine (ANTIVERT) 25 MG tablet Take 12.5 mg by mouth 2 (two) times daily as needed for dizziness.      . tamsulosin (FLOMAX) 0.4 MG CAPS capsule Take 2 capsules (0.8 mg total) by mouth daily after supper.  60 capsule  6  . temazepam (RESTORIL)  7.5 MG capsule Take 1 capsule (7.5 mg total) by mouth at bedtime as needed for sleep.  30 capsule  5  . traMADol (ULTRAM) 50 MG tablet Take 0.5 tablets (25 mg total) by mouth every 12 (twelve) hours as needed for moderate pain.  30 tablet  1   No facility-administered medications prior to visit.    PE: Blood pressure 140/56, pulse 65, temperature 97.5 F (36.4 C), temperature source Temporal, resp. rate 16, height 5\' 5"  (1.651 m), weight 164 lb (74.39 kg), SpO2 97.00%. Wt is down 3 lb compared to last wt about 1 mo ago here. Gen: Alert, well appearing.  Patient is oriented to person, place, time, and situation. LEGS: 3+ pitting edema from just below the knees down into feet.  No erythema, no weeping or skin breakdown.  LABS:    Chemistry      Component Value Date/Time   NA 139 03/19/2014 1513   K 4.2 03/19/2014 1513   CL 102 03/19/2014 1513   CO2 31 03/19/2014 1513   BUN 23 03/19/2014 1513   CREATININE 1.1 03/19/2014 1513   CREATININE 1.44* 01/16/2014 1658      Component Value Date/Time  CALCIUM 9.0 03/19/2014 1513   ALKPHOS 78 02/19/2014 0909   AST 20 02/19/2014 0909   ALT 17 02/19/2014 0909   BILITOT 0.4 02/19/2014 0909     Lab Results  Component Value Date   HGBA1C 5.9 03/19/2014   IMPRESSION AND PLAN:  1) Chronic LE edema; multifactorial.  Stable. Continue current lasix dosing, compression stockings, low Na diet when he can. Check BMET today.  2) Osteoarthritis pain in low back, also left shoulder pain from torn RC: he does not seem to need the tramadol very often, and it seems to cause excessive sedation/confusion.  We'll just d/c tramadol and vicodin from his meds at this time. He'll be getting a LB injection 04/22/14 and needs order sent to his ALF to get ASA held starting 04/17/14.  Order written and sent today.  3) prev health care: flu given recently in NH, gave Tdap here today.  An After Visit Summary was printed and given to the patient.  FOLLOW UP: 2 mo

## 2014-04-17 DIAGNOSIS — G8929 Other chronic pain: Secondary | ICD-10-CM | POA: Diagnosis not present

## 2014-04-17 DIAGNOSIS — M25519 Pain in unspecified shoulder: Secondary | ICD-10-CM | POA: Diagnosis not present

## 2014-04-17 DIAGNOSIS — I5043 Acute on chronic combined systolic (congestive) and diastolic (congestive) heart failure: Secondary | ICD-10-CM | POA: Diagnosis not present

## 2014-04-17 DIAGNOSIS — I129 Hypertensive chronic kidney disease with stage 1 through stage 4 chronic kidney disease, or unspecified chronic kidney disease: Secondary | ICD-10-CM | POA: Diagnosis not present

## 2014-04-17 DIAGNOSIS — M19049 Primary osteoarthritis, unspecified hand: Secondary | ICD-10-CM | POA: Diagnosis not present

## 2014-04-17 DIAGNOSIS — G609 Hereditary and idiopathic neuropathy, unspecified: Secondary | ICD-10-CM | POA: Diagnosis not present

## 2014-04-20 DIAGNOSIS — M25519 Pain in unspecified shoulder: Secondary | ICD-10-CM | POA: Diagnosis not present

## 2014-04-20 DIAGNOSIS — G609 Hereditary and idiopathic neuropathy, unspecified: Secondary | ICD-10-CM | POA: Diagnosis not present

## 2014-04-20 DIAGNOSIS — M19049 Primary osteoarthritis, unspecified hand: Secondary | ICD-10-CM | POA: Diagnosis not present

## 2014-04-20 DIAGNOSIS — I129 Hypertensive chronic kidney disease with stage 1 through stage 4 chronic kidney disease, or unspecified chronic kidney disease: Secondary | ICD-10-CM | POA: Diagnosis not present

## 2014-04-20 DIAGNOSIS — I5043 Acute on chronic combined systolic (congestive) and diastolic (congestive) heart failure: Secondary | ICD-10-CM | POA: Diagnosis not present

## 2014-04-20 DIAGNOSIS — G8929 Other chronic pain: Secondary | ICD-10-CM | POA: Diagnosis not present

## 2014-04-23 DIAGNOSIS — I129 Hypertensive chronic kidney disease with stage 1 through stage 4 chronic kidney disease, or unspecified chronic kidney disease: Secondary | ICD-10-CM | POA: Diagnosis not present

## 2014-04-23 DIAGNOSIS — M19049 Primary osteoarthritis, unspecified hand: Secondary | ICD-10-CM | POA: Diagnosis not present

## 2014-04-23 DIAGNOSIS — G609 Hereditary and idiopathic neuropathy, unspecified: Secondary | ICD-10-CM | POA: Diagnosis not present

## 2014-04-23 DIAGNOSIS — I5043 Acute on chronic combined systolic (congestive) and diastolic (congestive) heart failure: Secondary | ICD-10-CM | POA: Diagnosis not present

## 2014-04-23 DIAGNOSIS — G8929 Other chronic pain: Secondary | ICD-10-CM | POA: Diagnosis not present

## 2014-04-23 DIAGNOSIS — M25519 Pain in unspecified shoulder: Secondary | ICD-10-CM | POA: Diagnosis not present

## 2014-04-25 DIAGNOSIS — M19042 Primary osteoarthritis, left hand: Secondary | ICD-10-CM | POA: Diagnosis not present

## 2014-04-25 DIAGNOSIS — G8929 Other chronic pain: Secondary | ICD-10-CM | POA: Diagnosis not present

## 2014-04-25 DIAGNOSIS — M545 Low back pain: Secondary | ICD-10-CM | POA: Diagnosis not present

## 2014-04-25 DIAGNOSIS — I5033 Acute on chronic diastolic (congestive) heart failure: Secondary | ICD-10-CM | POA: Diagnosis not present

## 2014-04-25 DIAGNOSIS — F329 Major depressive disorder, single episode, unspecified: Secondary | ICD-10-CM | POA: Diagnosis not present

## 2014-04-25 DIAGNOSIS — I129 Hypertensive chronic kidney disease with stage 1 through stage 4 chronic kidney disease, or unspecified chronic kidney disease: Secondary | ICD-10-CM | POA: Diagnosis not present

## 2014-04-25 DIAGNOSIS — G609 Hereditary and idiopathic neuropathy, unspecified: Secondary | ICD-10-CM | POA: Diagnosis not present

## 2014-04-25 DIAGNOSIS — R2681 Unsteadiness on feet: Secondary | ICD-10-CM | POA: Diagnosis not present

## 2014-04-25 DIAGNOSIS — N183 Chronic kidney disease, stage 3 (moderate): Secondary | ICD-10-CM | POA: Diagnosis not present

## 2014-04-25 DIAGNOSIS — M19041 Primary osteoarthritis, right hand: Secondary | ICD-10-CM | POA: Diagnosis not present

## 2014-04-25 DIAGNOSIS — M25512 Pain in left shoulder: Secondary | ICD-10-CM | POA: Diagnosis not present

## 2014-04-25 DIAGNOSIS — Z9181 History of falling: Secondary | ICD-10-CM | POA: Diagnosis not present

## 2014-04-25 DIAGNOSIS — M6281 Muscle weakness (generalized): Secondary | ICD-10-CM | POA: Diagnosis not present

## 2014-04-27 DIAGNOSIS — G609 Hereditary and idiopathic neuropathy, unspecified: Secondary | ICD-10-CM | POA: Diagnosis not present

## 2014-04-27 DIAGNOSIS — M545 Low back pain: Secondary | ICD-10-CM | POA: Diagnosis not present

## 2014-04-27 DIAGNOSIS — R2681 Unsteadiness on feet: Secondary | ICD-10-CM | POA: Diagnosis not present

## 2014-04-27 DIAGNOSIS — M19041 Primary osteoarthritis, right hand: Secondary | ICD-10-CM | POA: Diagnosis not present

## 2014-04-27 DIAGNOSIS — G8929 Other chronic pain: Secondary | ICD-10-CM | POA: Diagnosis not present

## 2014-04-27 DIAGNOSIS — I5033 Acute on chronic diastolic (congestive) heart failure: Secondary | ICD-10-CM | POA: Diagnosis not present

## 2014-04-28 DIAGNOSIS — R2681 Unsteadiness on feet: Secondary | ICD-10-CM | POA: Diagnosis not present

## 2014-04-28 DIAGNOSIS — G8929 Other chronic pain: Secondary | ICD-10-CM | POA: Diagnosis not present

## 2014-04-28 DIAGNOSIS — M19041 Primary osteoarthritis, right hand: Secondary | ICD-10-CM | POA: Diagnosis not present

## 2014-04-28 DIAGNOSIS — I5033 Acute on chronic diastolic (congestive) heart failure: Secondary | ICD-10-CM | POA: Diagnosis not present

## 2014-04-28 DIAGNOSIS — M545 Low back pain: Secondary | ICD-10-CM | POA: Diagnosis not present

## 2014-04-28 DIAGNOSIS — G609 Hereditary and idiopathic neuropathy, unspecified: Secondary | ICD-10-CM | POA: Diagnosis not present

## 2014-04-30 DIAGNOSIS — G609 Hereditary and idiopathic neuropathy, unspecified: Secondary | ICD-10-CM | POA: Diagnosis not present

## 2014-04-30 DIAGNOSIS — M545 Low back pain: Secondary | ICD-10-CM | POA: Diagnosis not present

## 2014-04-30 DIAGNOSIS — I5033 Acute on chronic diastolic (congestive) heart failure: Secondary | ICD-10-CM | POA: Diagnosis not present

## 2014-04-30 DIAGNOSIS — R2681 Unsteadiness on feet: Secondary | ICD-10-CM | POA: Diagnosis not present

## 2014-04-30 DIAGNOSIS — G8929 Other chronic pain: Secondary | ICD-10-CM | POA: Diagnosis not present

## 2014-04-30 DIAGNOSIS — M19041 Primary osteoarthritis, right hand: Secondary | ICD-10-CM | POA: Diagnosis not present

## 2014-05-01 DIAGNOSIS — M19041 Primary osteoarthritis, right hand: Secondary | ICD-10-CM | POA: Diagnosis not present

## 2014-05-01 DIAGNOSIS — M545 Low back pain: Secondary | ICD-10-CM | POA: Diagnosis not present

## 2014-05-01 DIAGNOSIS — I5033 Acute on chronic diastolic (congestive) heart failure: Secondary | ICD-10-CM | POA: Diagnosis not present

## 2014-05-01 DIAGNOSIS — G8929 Other chronic pain: Secondary | ICD-10-CM | POA: Diagnosis not present

## 2014-05-01 DIAGNOSIS — R2681 Unsteadiness on feet: Secondary | ICD-10-CM | POA: Diagnosis not present

## 2014-05-01 DIAGNOSIS — G609 Hereditary and idiopathic neuropathy, unspecified: Secondary | ICD-10-CM | POA: Diagnosis not present

## 2014-05-04 DIAGNOSIS — G609 Hereditary and idiopathic neuropathy, unspecified: Secondary | ICD-10-CM | POA: Diagnosis not present

## 2014-05-04 DIAGNOSIS — G8929 Other chronic pain: Secondary | ICD-10-CM | POA: Diagnosis not present

## 2014-05-04 DIAGNOSIS — M19041 Primary osteoarthritis, right hand: Secondary | ICD-10-CM | POA: Diagnosis not present

## 2014-05-04 DIAGNOSIS — M545 Low back pain: Secondary | ICD-10-CM | POA: Diagnosis not present

## 2014-05-04 DIAGNOSIS — R2681 Unsteadiness on feet: Secondary | ICD-10-CM | POA: Diagnosis not present

## 2014-05-04 DIAGNOSIS — I5033 Acute on chronic diastolic (congestive) heart failure: Secondary | ICD-10-CM | POA: Diagnosis not present

## 2014-05-05 ENCOUNTER — Telehealth: Payer: Self-pay

## 2014-05-05 ENCOUNTER — Emergency Department (HOSPITAL_BASED_OUTPATIENT_CLINIC_OR_DEPARTMENT_OTHER)
Admission: EM | Admit: 2014-05-05 | Discharge: 2014-05-05 | Disposition: A | Discharge status: Home / Self Care | Payer: Medicare Other | Attending: Emergency Medicine | Admitting: Emergency Medicine

## 2014-05-05 ENCOUNTER — Encounter (HOSPITAL_BASED_OUTPATIENT_CLINIC_OR_DEPARTMENT_OTHER): Payer: Self-pay

## 2014-05-05 DIAGNOSIS — G609 Hereditary and idiopathic neuropathy, unspecified: Secondary | ICD-10-CM | POA: Diagnosis not present

## 2014-05-05 DIAGNOSIS — F329 Major depressive disorder, single episode, unspecified: Secondary | ICD-10-CM | POA: Insufficient documentation

## 2014-05-05 DIAGNOSIS — Z8673 Personal history of transient ischemic attack (TIA), and cerebral infarction without residual deficits: Secondary | ICD-10-CM | POA: Diagnosis not present

## 2014-05-05 DIAGNOSIS — M545 Low back pain, unspecified: Secondary | ICD-10-CM

## 2014-05-05 DIAGNOSIS — Z79899 Other long term (current) drug therapy: Secondary | ICD-10-CM | POA: Insufficient documentation

## 2014-05-05 DIAGNOSIS — Z880 Allergy status to penicillin: Secondary | ICD-10-CM | POA: Diagnosis not present

## 2014-05-05 DIAGNOSIS — I5033 Acute on chronic diastolic (congestive) heart failure: Secondary | ICD-10-CM | POA: Diagnosis not present

## 2014-05-05 DIAGNOSIS — M1990 Unspecified osteoarthritis, unspecified site: Secondary | ICD-10-CM | POA: Insufficient documentation

## 2014-05-05 DIAGNOSIS — N189 Chronic kidney disease, unspecified: Secondary | ICD-10-CM | POA: Insufficient documentation

## 2014-05-05 DIAGNOSIS — Z862 Personal history of diseases of the blood and blood-forming organs and certain disorders involving the immune mechanism: Secondary | ICD-10-CM | POA: Insufficient documentation

## 2014-05-05 DIAGNOSIS — Z87891 Personal history of nicotine dependence: Secondary | ICD-10-CM | POA: Insufficient documentation

## 2014-05-05 DIAGNOSIS — H409 Unspecified glaucoma: Secondary | ICD-10-CM | POA: Insufficient documentation

## 2014-05-05 DIAGNOSIS — Z7982 Long term (current) use of aspirin: Secondary | ICD-10-CM | POA: Insufficient documentation

## 2014-05-05 DIAGNOSIS — Z85828 Personal history of other malignant neoplasm of skin: Secondary | ICD-10-CM | POA: Insufficient documentation

## 2014-05-05 DIAGNOSIS — R609 Edema, unspecified: Secondary | ICD-10-CM | POA: Diagnosis not present

## 2014-05-05 DIAGNOSIS — M549 Dorsalgia, unspecified: Secondary | ICD-10-CM | POA: Diagnosis not present

## 2014-05-05 DIAGNOSIS — R52 Pain, unspecified: Secondary | ICD-10-CM | POA: Diagnosis not present

## 2014-05-05 DIAGNOSIS — G8929 Other chronic pain: Secondary | ICD-10-CM | POA: Diagnosis not present

## 2014-05-05 DIAGNOSIS — I129 Hypertensive chronic kidney disease with stage 1 through stage 4 chronic kidney disease, or unspecified chronic kidney disease: Secondary | ICD-10-CM | POA: Insufficient documentation

## 2014-05-05 DIAGNOSIS — I5032 Chronic diastolic (congestive) heart failure: Secondary | ICD-10-CM | POA: Diagnosis not present

## 2014-05-05 DIAGNOSIS — I252 Old myocardial infarction: Secondary | ICD-10-CM | POA: Diagnosis not present

## 2014-05-05 DIAGNOSIS — M19041 Primary osteoarthritis, right hand: Secondary | ICD-10-CM | POA: Diagnosis not present

## 2014-05-05 DIAGNOSIS — R2681 Unsteadiness on feet: Secondary | ICD-10-CM | POA: Diagnosis not present

## 2014-05-05 HISTORY — DX: Dorsalgia, unspecified: M54.9

## 2014-05-05 HISTORY — DX: Other chronic pain: G89.29

## 2014-05-05 NOTE — ED Notes (Signed)
Chronic back pain that is worse today.  He is unable to perform ADLs today.

## 2014-05-05 NOTE — ED Notes (Signed)
Spoke with pt's son Gerald Stabs who is coming to drive him back to [facility]

## 2014-05-05 NOTE — Telephone Encounter (Signed)
Please advise 

## 2014-05-05 NOTE — ED Notes (Signed)
MD at bedside. 

## 2014-05-05 NOTE — ED Provider Notes (Signed)
CSN: [id number]     Arrival date & time 05/05/14  1340 History   First MD Initiated Contact with Patient 05/05/14 1429     Chief Complaint  Patient presents with  . Back Pain     (Consider location/radiation/quality/duration/timing/severity/associated sxs/prior Treatment) Patient is a 80 y.o. male presenting with back pain. The history is provided by the patient.  Back Pain Associated symptoms: no abdominal pain, no chest pain, no dysuria, no fever, no headaches, no numbness and no weakness   [facility] from [facility]. Brought in by EMS. Patient with increased low back pain today. Patient has been having trouble with the back for a while but worse this past week. Patient states he had x-rays of his back done. No fall. Patient states that they normally give him pain medication he cannot remember if he received any. Patient sitting after bleeding in the room. Increases when he gets on his feet. Denies any numbness or new weakness to the legs.  Past Medical History  Diagnosis Date  . Osteoarthritis     hands/wrists  . Depression     antidepressant x 1 yr--situational. Resolved 08/2013 per pt/son.  . Glaucoma   . Hypertension   . 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  No significant residual deficit except short term memory impairment  . Urine incontinence   . Frequent headaches   . Chronic venous insufficiency     compression hose  . Peripheral neuropathy 2013    Dx'd by neuro in [place] at the time the patient was hospitalized briefly for [place] CVA.  . NSTEMI (non-ST elevated myocardial infarction) 06/2011    [place]: Troponin mildly elevated, normal EKG, normal ECHO, pt declined cardiology consult--per old PCP records.  . Basal cell carcinoma 2011    Face  . Chronic renal insufficiency, stage III (moderate) 2013    CrCl in the 50s  . Chronic diastolic heart failure   . Chronic left shoulder pain 09/14/2013  . Normocytic anemia 2015    secondary to CRI  (iron studies ok 12/2013)  . Shortness of breath   . Chronic back pain    Past Surgical History  Procedure Laterality Date  . Cholecystectomy    . Tonsilectomy, adenoidectomy, bilateral myringotomy and tubes    . Transthoracic echocardiogram  12/18/13    Grade 1 diast dysf, o/w normal   Family History  Problem Relation Age of Onset  . Cancer Mother   . Heart disease Mother    History  Substance Use Topics  . Smoking status: Former Research scientist (life sciences)  . Smokeless tobacco: Never Used  . Alcohol Use: No    Review of Systems  Constitutional: Negative for fever.  HENT: Negative for congestion.   Respiratory: Negative for shortness of breath.   Cardiovascular: Negative for chest pain.  Gastrointestinal: Negative for nausea, vomiting and abdominal pain.  Genitourinary: Negative for dysuria.  Musculoskeletal: Positive for back pain.  Skin: Negative for rash.  Neurological: Negative for weakness, numbness and headaches.  Hematological: Does not bruise/bleed easily.  Psychiatric/Behavioral: Negative for confusion.      Allergies  Altace; Amoxicillin; and Cozaar  Home Medications   Prior to Admission medications   Medication Sig Start Date End Date Taking? Authorizing Provider  HYDROcodone-acetaminophen (NORCO/VICODIN) 5-325 MG per tablet Take 1 tablet by mouth every 6 (six) hours as needed for moderate pain.   Yes Historical Provider, MD  traMADol (ULTRAM) 50 MG tablet Take by mouth every 6 (six) hours as needed.  Yes Historical Provider, MD  aspirin 81 MG chewable tablet Chew 81 mg by mouth daily.    Historical Provider, MD  atorvastatin (LIPITOR) 20 MG tablet Take 20 mg by mouth every evening.  10/28/13   Historical Provider, MD  azelastine (OPTIVAR) 0.05 % ophthalmic solution Place 1 drop into the right eye 2 (two) times daily.  07/22/13   Historical Provider, MD  baclofen (LIORESAL) 10 MG tablet Take 10 mg by mouth 3 (three) times daily as needed for muscle spasms.     Historical  Provider, MD  BETOPTIC-S 0.25 % ophthalmic suspension Place 1 drop into the right eye daily.  07/22/13   Historical Provider, MD  finasteride (PROSCAR) 5 MG tablet Take 1 tablet (5 mg total) by mouth daily. 01/28/14   [name], MD  furosemide (LASIX) 40 MG tablet Take 40 mg by mouth daily.    Historical Provider, MD  hydrALAZINE (APRESOLINE) 10 MG tablet Take 10 mg by mouth 3 (three) times daily.  08/12/13   Historical Provider, MD  meclizine (ANTIVERT) 25 MG tablet Take 12.5 mg by mouth 2 (two) times daily as needed for dizziness.    Historical Provider, MD  tamsulosin (FLOMAX) 0.4 MG CAPS capsule Take 2 capsules (0.8 mg total) by mouth daily after supper. 01/02/14   [name], MD  temazepam (RESTORIL) 7.5 MG capsule Take 1 capsule (7.5 mg total) by mouth at bedtime as needed for sleep. 01/14/14   [name], MD   BP 139/58 mmHg  Pulse 76  Temp(Src) 98.5 F (36.9 C) (Oral)  Resp 18  SpO2 97% Physical Exam  Constitutional: He is oriented to person, place, and time. He appears well-developed and well-nourished. No distress.  HENT:  Head: Normocephalic and atraumatic.  Mouth/Throat: Oropharynx is clear and moist.  Eyes: Conjunctivae and EOM are normal. Pupils are equal, round, and reactive to light.  Neck: Normal range of motion.  Cardiovascular: Normal rate, regular rhythm and normal heart sounds.   Pulmonary/Chest: Effort normal and breath sounds normal. No respiratory distress.  Abdominal: Soft. Bowel sounds are normal. There is no tenderness.  Musculoskeletal: Normal range of motion. He exhibits edema.  Neurological: He is alert and oriented to person, place, and time. No cranial nerve deficit. He exhibits normal muscle tone. Coordination normal.  Skin: Skin is warm. No rash noted.  Nursing note and vitals reviewed.   ED Course  Procedures (including critical care time) Labs Review Labs Reviewed - No data to display  Imaging Review No results found.   EKG  Interpretation None      MDM   Final diagnoses:  Bilateral low back pain without sciatica    The patient sent in from nursing facility. No falls. Been having trouble with low back pain for a while periods been worse the past week or so. Patient states he had x-rays of his back earlier this week without any significant findings but there were degenerative things. He is being treated with hydrocodone. Patient not able to remember whether he was given any today. Today the back pain was much worse than usual. But does been no fall. No lower extremity neuro deficits. Patient according to nursing home record was given hydrocodone this morning. Patient seems to be feeling better. Still with some pain when standing. We'll check with nursing facility see if he can receive another dose prior to discharge. Follow-up with his physician is recommended.    Fredia Sorrow, MD 05/05/14 [id number]

## 2014-05-05 NOTE — Discharge Instructions (Signed)
Patient states that he's had x-rays of his back this week. No falls. X-ray showed some degenerative changes as per the patient. Patient was given some hydrocodone this morning. Seems to be feeling more comfortable now. Still has some pain though with standing but more comfortable sitting. No neuro deficits. Would recommend discontinuing the hydrocodone.

## 2014-05-05 NOTE — Telephone Encounter (Signed)
Elmyra Ricks from [facility] called and Lance Kemp is having very bad back pain ans refuses to get out of bed. He is scheduled for an injection Dec 1. Can he get a RX faxed to [facility] [phone] for pain that he can receive regularly. Thanks

## 2014-05-07 DIAGNOSIS — R2681 Unsteadiness on feet: Secondary | ICD-10-CM | POA: Diagnosis not present

## 2014-05-07 DIAGNOSIS — M545 Low back pain: Secondary | ICD-10-CM | POA: Diagnosis not present

## 2014-05-07 DIAGNOSIS — G609 Hereditary and idiopathic neuropathy, unspecified: Secondary | ICD-10-CM | POA: Diagnosis not present

## 2014-05-07 DIAGNOSIS — G8929 Other chronic pain: Secondary | ICD-10-CM | POA: Diagnosis not present

## 2014-05-07 DIAGNOSIS — I5033 Acute on chronic diastolic (congestive) heart failure: Secondary | ICD-10-CM | POA: Diagnosis not present

## 2014-05-07 DIAGNOSIS — M19041 Primary osteoarthritis, right hand: Secondary | ICD-10-CM | POA: Diagnosis not present

## 2014-05-08 DIAGNOSIS — M545 Low back pain: Secondary | ICD-10-CM | POA: Diagnosis not present

## 2014-05-08 DIAGNOSIS — G609 Hereditary and idiopathic neuropathy, unspecified: Secondary | ICD-10-CM | POA: Diagnosis not present

## 2014-05-08 DIAGNOSIS — R2681 Unsteadiness on feet: Secondary | ICD-10-CM | POA: Diagnosis not present

## 2014-05-08 DIAGNOSIS — M19041 Primary osteoarthritis, right hand: Secondary | ICD-10-CM | POA: Diagnosis not present

## 2014-05-08 DIAGNOSIS — G8929 Other chronic pain: Secondary | ICD-10-CM | POA: Diagnosis not present

## 2014-05-08 DIAGNOSIS — I5033 Acute on chronic diastolic (congestive) heart failure: Secondary | ICD-10-CM | POA: Diagnosis not present

## 2014-05-09 ENCOUNTER — Encounter (HOSPITAL_COMMUNITY): Payer: Self-pay | Admitting: Cardiology

## 2014-05-09 ENCOUNTER — Emergency Department (HOSPITAL_COMMUNITY)
Admission: EM | Admit: 2014-05-09 | Discharge: 2014-05-09 | Disposition: A | Discharge status: Home / Self Care | Payer: Medicare Other | Attending: Emergency Medicine | Admitting: Emergency Medicine

## 2014-05-09 DIAGNOSIS — G8929 Other chronic pain: Secondary | ICD-10-CM | POA: Insufficient documentation

## 2014-05-09 DIAGNOSIS — K5900 Constipation, unspecified: Secondary | ICD-10-CM | POA: Insufficient documentation

## 2014-05-09 DIAGNOSIS — H409 Unspecified glaucoma: Secondary | ICD-10-CM | POA: Diagnosis not present

## 2014-05-09 DIAGNOSIS — R0600 Dyspnea, unspecified: Secondary | ICD-10-CM | POA: Insufficient documentation

## 2014-05-09 DIAGNOSIS — R05 Cough: Secondary | ICD-10-CM | POA: Insufficient documentation

## 2014-05-09 DIAGNOSIS — R52 Pain, unspecified: Secondary | ICD-10-CM | POA: Diagnosis not present

## 2014-05-09 DIAGNOSIS — M791 Myalgia: Secondary | ICD-10-CM | POA: Diagnosis not present

## 2014-05-09 DIAGNOSIS — M25552 Pain in left hip: Secondary | ICD-10-CM | POA: Diagnosis not present

## 2014-05-09 DIAGNOSIS — M1990 Unspecified osteoarthritis, unspecified site: Secondary | ICD-10-CM | POA: Diagnosis not present

## 2014-05-09 DIAGNOSIS — Z7982 Long term (current) use of aspirin: Secondary | ICD-10-CM | POA: Diagnosis not present

## 2014-05-09 DIAGNOSIS — R609 Edema, unspecified: Secondary | ICD-10-CM | POA: Diagnosis not present

## 2014-05-09 DIAGNOSIS — M5440 Lumbago with sciatica, unspecified side: Secondary | ICD-10-CM | POA: Diagnosis not present

## 2014-05-09 DIAGNOSIS — I252 Old myocardial infarction: Secondary | ICD-10-CM | POA: Diagnosis not present

## 2014-05-09 DIAGNOSIS — Z85828 Personal history of other malignant neoplasm of skin: Secondary | ICD-10-CM | POA: Insufficient documentation

## 2014-05-09 DIAGNOSIS — Z87891 Personal history of nicotine dependence: Secondary | ICD-10-CM | POA: Diagnosis not present

## 2014-05-09 DIAGNOSIS — I5032 Chronic diastolic (congestive) heart failure: Secondary | ICD-10-CM | POA: Insufficient documentation

## 2014-05-09 DIAGNOSIS — I129 Hypertensive chronic kidney disease with stage 1 through stage 4 chronic kidney disease, or unspecified chronic kidney disease: Secondary | ICD-10-CM | POA: Diagnosis not present

## 2014-05-09 DIAGNOSIS — M545 Low back pain: Secondary | ICD-10-CM | POA: Diagnosis not present

## 2014-05-09 DIAGNOSIS — Z79899 Other long term (current) drug therapy: Secondary | ICD-10-CM | POA: Diagnosis not present

## 2014-05-09 DIAGNOSIS — M479 Spondylosis, unspecified: Secondary | ICD-10-CM | POA: Diagnosis not present

## 2014-05-09 DIAGNOSIS — Z8673 Personal history of transient ischemic attack (TIA), and cerebral infarction without residual deficits: Secondary | ICD-10-CM | POA: Diagnosis not present

## 2014-05-09 DIAGNOSIS — Z880 Allergy status to penicillin: Secondary | ICD-10-CM | POA: Diagnosis not present

## 2014-05-09 DIAGNOSIS — N183 Chronic kidney disease, stage 3 (moderate): Secondary | ICD-10-CM | POA: Insufficient documentation

## 2014-05-09 DIAGNOSIS — G9520 Unspecified cord compression: Secondary | ICD-10-CM | POA: Diagnosis not present

## 2014-05-09 DIAGNOSIS — Z862 Personal history of diseases of the blood and blood-forming organs and certain disorders involving the immune mechanism: Secondary | ICD-10-CM | POA: Diagnosis not present

## 2014-05-09 DIAGNOSIS — R531 Weakness: Secondary | ICD-10-CM | POA: Insufficient documentation

## 2014-05-09 DIAGNOSIS — M549 Dorsalgia, unspecified: Secondary | ICD-10-CM

## 2014-05-09 LAB — URINALYSIS, ROUTINE W REFLEX MICROSCOPIC
Bilirubin Urine: NEGATIVE
GLUCOSE, UA: NEGATIVE mg/dL
HGB URINE DIPSTICK: NEGATIVE
Ketones, ur: NEGATIVE mg/dL
Leukocytes, UA: NEGATIVE
Nitrite: NEGATIVE
PROTEIN: NEGATIVE mg/dL
Specific Gravity, Urine: 1.011 (ref 1.005–1.030)
Urobilinogen, UA: 0.2 mg/dL (ref 0.0–1.0)
pH: 8.5 — ABNORMAL HIGH (ref 5.0–8.0)

## 2014-05-09 NOTE — ED Provider Notes (Signed)
CSN: [id number]     Arrival date & time 05/09/14  1023 History   First MD Initiated Contact with Patient 05/09/14 1102     Chief Complaint  Patient presents with  . Leg Pain  . Hip Pain     (Consider location/radiation/quality/duration/timing/severity/associated sxs/prior Treatment) HPI Comments: Lance Kemp is a 80 year-old male with a history of osteoarthritis, chronic back pain, CKD stage III, left rotator cuff tear, and CVA (pontine - 2013, only lasting deficit is short-term memory impairment) who presents with worsening back pain. Upon awakening approximately 4 hours ago, Lance Kemp noticed 10/10 lower back pain that was much worse than his usual back pain.  He also endorsed left hip and thigh pain at that time.  He states that both legs "were like lead" and he was unable to feel or move them, although he could move his toes.  EMS was called to transport Lance Kemp from [facility] to the [facility] ED.  On interview in the ED, Lance Kemp endorses ongoing 9.5/10 low back pain that he says is lower in his spine than his typical pain.  He denies recent trauma, falls, or unusual physical activity.  (He usually walks with a walker.)  He also endorses subjective range of motion limitation in his left arm.  ROS is positive for constipation, stable lower extremity edema, and stable cough and dyspnea.  He denies fever, chills, chest pain, dysuria, lightheadedness, and numbness/tingling.  Of note, pt has had MRi of his spine just few months ago, and states that 3 of the levels had some abnormalities, but that there was no surgery indicated.  Pt has no associated numbness, weakness, urinary incontinence, urinary retention, bowel incontinence, saddle anesthesia. By the time pt was seen by [place], his strength had returned to baseline.    Patient is a 80 y.o. male presenting with leg pain and hip pain. The history is provided by the patient.  Leg Pain Associated symptoms: back pain   Hip Pain Pertinent  negatives include no chest pain, no abdominal pain, no headaches and no shortness of breath.    Past Medical History  Diagnosis Date  . Osteoarthritis     hands/wrists  . Depression     antidepressant x 1 yr--situational. Resolved 08/2013 per pt/son.  . Glaucoma   . Hypertension   . 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  No significant residual deficit except short term memory impairment  . Urine incontinence   . Frequent headaches   . Chronic venous insufficiency     compression hose  . Peripheral neuropathy 2013    Dx'd by neuro in [place] at the time the patient was hospitalized briefly for pontine CVA.  . NSTEMI (non-ST elevated myocardial infarction) 06/2011    [place]: Troponin mildly elevated, normal EKG, normal ECHO, pt declined cardiology consult--per old PCP records.  . Basal cell carcinoma 2011    Face  . Chronic renal insufficiency, stage III (moderate) 2013    CrCl in the 50s  . Chronic diastolic heart failure   . Chronic left shoulder pain 09/14/2013  . Normocytic anemia 2015    secondary to CRI (iron studies ok 12/2013)  . Shortness of breath   . Chronic back pain    Past Surgical History  Procedure Laterality Date  . Cholecystectomy    . Tonsilectomy, adenoidectomy, bilateral myringotomy and tubes    . Transthoracic echocardiogram  12/18/13    Grade 1 diast dysf, o/w normal   Family History  Problem Relation Age of Onset  . Cancer Mother   . Heart disease Mother    History  Substance Use Topics  . Smoking status: Former Research scientist (life sciences)  . Smokeless tobacco: Never Used  . Alcohol Use: No    Review of Systems  Constitutional: Negative for activity change and appetite change.  Respiratory: Negative for cough and shortness of breath.   Cardiovascular: Negative for chest pain.  Gastrointestinal: Negative for abdominal pain.  Genitourinary: Negative for dysuria and hematuria.  Musculoskeletal: Positive for back pain.  Neurological: Positive for weakness.  Negative for dizziness, facial asymmetry, numbness and headaches.      Allergies  Altace; Amoxicillin; and Cozaar  Home Medications   Prior to Admission medications   Medication Sig Start Date End Date Taking? Authorizing Provider  aspirin 81 MG chewable tablet Chew 81 mg by mouth daily.   Yes Historical Provider, MD  atorvastatin (LIPITOR) 20 MG tablet Take 20 mg by mouth every evening.  10/28/13  Yes Historical Provider, MD  azelastine (OPTIVAR) 0.05 % ophthalmic solution Place 1 drop into the right eye 2 (two) times daily.  07/22/13  Yes Historical Provider, MD  baclofen (LIORESAL) 10 MG tablet Take 10 mg by mouth 3 (three) times daily as needed for muscle spasms.    Yes Historical Provider, MD  BETOPTIC-S 0.25 % ophthalmic suspension Place 1 drop into the right eye daily.  07/22/13  Yes Historical Provider, MD  finasteride (PROSCAR) 5 MG tablet Take 1 tablet (5 mg total) by mouth daily. 01/28/14  Yes [name], MD  furosemide (LASIX) 40 MG tablet Take 40 mg by mouth daily.   Yes Historical Provider, MD  hydrALAZINE (APRESOLINE) 10 MG tablet Take 10 mg by mouth 3 (three) times daily.  08/12/13  Yes Historical Provider, MD  HYDROcodone-acetaminophen (NORCO/VICODIN) 5-325 MG per tablet Take 1 tablet by mouth every 6 (six) hours as needed for moderate pain.   Yes Historical Provider, MD  meclizine (ANTIVERT) 25 MG tablet Take 12.5 mg by mouth 2 (two) times daily as needed for dizziness.   Yes Historical Provider, MD  tamsulosin (FLOMAX) 0.4 MG CAPS capsule Take 2 capsules (0.8 mg total) by mouth daily after supper. 01/02/14  Yes [name], MD  temazepam (RESTORIL) 7.5 MG capsule Take 1 capsule (7.5 mg total) by mouth at bedtime as needed for sleep. 01/14/14  Yes [name], MD  traMADol (ULTRAM) 50 MG tablet Take 25 mg by mouth every 12 (twelve) hours as needed for moderate pain.    Yes Historical Provider, MD   BP 131/82 mmHg  Pulse 66  Temp(Src) 97.5 F (36.4 C) (Oral)   Resp 18  Wt 171 lb (77.565 kg)  SpO2 94% Physical Exam  Constitutional: He is oriented to person, place, and time. He appears well-developed.  HENT:  Head: Normocephalic and atraumatic.  Eyes: Conjunctivae and EOM are normal. Pupils are equal, round, and reactive to light.  Neck: Normal range of motion. Neck supple.  Cardiovascular: Normal rate and regular rhythm.   Pulmonary/Chest: Effort normal and breath sounds normal.  Abdominal: Soft. Bowel sounds are normal. He exhibits no distension. There is no tenderness. There is no rebound and no guarding.  Musculoskeletal:  Pt has tenderness over the lumbar region No step offs, no erythema. Pt has 1+ patellar reflex bilaterally. Able to discriminate between sharp and dull. Able to ambulate   Neurological: He is alert and oriented to person, place, and time.  Skin: Skin is warm.  Nursing note and vitals reviewed.   ED Course  Procedures (including critical care time) Labs Review Labs Reviewed  URINALYSIS, ROUTINE W REFLEX MICROSCOPIC - Abnormal; Notable for the following:    pH 8.5 (*)    All other components within normal limits    Imaging Review No results found.   EKG Interpretation None      MDM   Final diagnoses:  Back pain    Pt comes in with cc of back pain and bilateral leg paralysis.  DDx includes: - DJD of the back - Spondylitises/ spondylosis - Sciatica - Spinal cord compression - Conus medullaris - Epidural hematoma - Epidural abscess - Lytic/pathologic fracture - Myelitis - Musculoskeletal pain   Pt is now ambulating, and baseline normal 4+/5 strength in the lower extremities. Unlikely TIA, as sx bilateral. Now has normal reflex, sensory and motor exam. Will d/c. Has appropriate f.u  Varney Biles, MD 05/09/14 1524

## 2014-05-09 NOTE — ED Notes (Signed)
Pt ambulated with walker in the hallway. Pt tolerated very well. Heart rate 78 o2 96

## 2014-05-09 NOTE — ED Notes (Signed)
Pt to department via EMS from [facility]- pt reports left hip and leg pain/back pain that started this morning. Reports that he was unable to get up this morning. Pt with noted bilateral edema. Bp-152/82 HR-69

## 2014-05-09 NOTE — Discharge Instructions (Signed)
We saw you in the ER for the back pain, and inability to move the legs. You have normal, baseline strength to the leg now. We are not sure what is causing your symptoms. The workup in the ER is not complete, and is limited to screening for life threatening and emergent conditions only, so please see a primary care doctor for further evaluation.  WE DONT THINK YOU HAD A TIA FOR SURE.   Back Pain, Adult Low back pain is very common. About 1 in 5 people have back pain.The cause of low back pain is rarely dangerous. The pain often gets better over time.About half of people with a sudden onset of back pain feel better in just 2 weeks. About 8 in 10 people feel better by 6 weeks.  CAUSES Some common causes of back pain include:  Strain of the muscles or ligaments supporting the spine.  Wear and tear (degeneration) of the spinal discs.  Arthritis.  Direct injury to the back. DIAGNOSIS Most of the time, the direct cause of low back pain is not known.However, back pain can be treated effectively even when the exact cause of the pain is unknown.Answering your caregiver's questions about your overall health and symptoms is one of the most accurate ways to make sure the cause of your pain is not dangerous. If your caregiver needs more information, he or she may order lab work or imaging tests (X-rays or MRIs).However, even if imaging tests show changes in your back, this usually does not require surgery. HOME CARE INSTRUCTIONS For many people, back pain returns.Since low back pain is rarely dangerous, it is often a condition that people can learn to [facility] their own.   Remain active. It is stressful on the back to sit or stand in one place. Do not sit, drive, or stand in one place for more than 30 minutes at a time. Take short walks on level surfaces as soon as pain allows.Try to increase the length of time you walk each day.  Do not stay in bed.Resting more than 1 or 2 days can delay your  recovery.  Do not avoid exercise or work.Your body is made to move.It is not dangerous to be active, even though your back may hurt.Your back will likely heal faster if you return to being active before your pain is gone.  Pay attention to your body when you bend and lift. Many people have less discomfortwhen lifting if they bend their knees, keep the load close to their bodies,and avoid twisting. Often, the most comfortable positions are those that put less stress on your recovering back.  Find a comfortable position to sleep. Use a firm mattress and lie on your side with your knees slightly bent. If you lie on your back, put a pillow under your knees.  Only take over-the-counter or prescription medicines as directed by your caregiver. Over-the-counter medicines to reduce pain and inflammation are often the most helpful.Your caregiver may prescribe muscle relaxant drugs.These medicines help dull your pain so you can more quickly return to your normal activities and healthy exercise.  Put ice on the injured area.  Put ice in a plastic bag.  Place a towel between your skin and the bag.  Leave the ice on for 15-20 minutes, 03-04 times a day for the first 2 to 3 days. After that, ice and heat may be alternated to reduce pain and spasms.  Ask your caregiver about trying back exercises and gentle massage. This may be of  some benefit.  Avoid feeling anxious or stressed.Stress increases muscle tension and can worsen back pain.It is important to recognize when you are anxious or stressed and learn ways to manage it.Exercise is a great option. SEEK MEDICAL CARE IF:  You have pain that is not relieved with rest or medicine.  You have pain that does not improve in 1 week.  You have new symptoms.  You are generally not feeling well. SEEK IMMEDIATE MEDICAL CARE IF:   You have pain that radiates from your back into your legs.  You develop new bowel or bladder control problems.  You  have unusual weakness or numbness in your arms or legs.  You develop nausea or vomiting.  You develop abdominal pain.  You feel faint. Document Released: 06/05/2005 Document Revised: 12/05/2011 Document Reviewed: 10/07/2013 [facility] Patient Information 2015 [place], [place]. This information is not intended to replace advice given to you by your health care provider. Make sure you discuss any questions you have with your health care provider.

## 2014-05-09 NOTE — ED Notes (Addendum)
Pt able to stand with assistance in the room and walk a few steps. States this is his normal

## 2014-05-11 DIAGNOSIS — G609 Hereditary and idiopathic neuropathy, unspecified: Secondary | ICD-10-CM | POA: Diagnosis not present

## 2014-05-11 DIAGNOSIS — M545 Low back pain: Secondary | ICD-10-CM | POA: Diagnosis not present

## 2014-05-11 DIAGNOSIS — G8929 Other chronic pain: Secondary | ICD-10-CM | POA: Diagnosis not present

## 2014-05-11 DIAGNOSIS — I5033 Acute on chronic diastolic (congestive) heart failure: Secondary | ICD-10-CM | POA: Diagnosis not present

## 2014-05-11 DIAGNOSIS — M19041 Primary osteoarthritis, right hand: Secondary | ICD-10-CM | POA: Diagnosis not present

## 2014-05-11 DIAGNOSIS — R2681 Unsteadiness on feet: Secondary | ICD-10-CM | POA: Diagnosis not present

## 2014-05-11 NOTE — Telephone Encounter (Signed)
Liai from [place] called & is requesting a CB [phone]

## 2014-05-12 DIAGNOSIS — R2681 Unsteadiness on feet: Secondary | ICD-10-CM | POA: Diagnosis not present

## 2014-05-12 DIAGNOSIS — G609 Hereditary and idiopathic neuropathy, unspecified: Secondary | ICD-10-CM | POA: Diagnosis not present

## 2014-05-12 DIAGNOSIS — M545 Low back pain: Secondary | ICD-10-CM | POA: Diagnosis not present

## 2014-05-12 DIAGNOSIS — I5033 Acute on chronic diastolic (congestive) heart failure: Secondary | ICD-10-CM | POA: Diagnosis not present

## 2014-05-12 DIAGNOSIS — M19041 Primary osteoarthritis, right hand: Secondary | ICD-10-CM | POA: Diagnosis not present

## 2014-05-12 DIAGNOSIS — G8929 Other chronic pain: Secondary | ICD-10-CM | POA: Diagnosis not present

## 2014-05-12 NOTE — Telephone Encounter (Signed)
Lichi from [place] was requesting electrical stimulation through indifferent currents.  Per Dr. Anitra Lauth, Okay to give verbal order.

## 2014-05-13 ENCOUNTER — Telehealth: Payer: Self-pay | Admitting: Family Medicine

## 2014-05-13 DIAGNOSIS — G8929 Other chronic pain: Secondary | ICD-10-CM | POA: Diagnosis not present

## 2014-05-13 DIAGNOSIS — M19041 Primary osteoarthritis, right hand: Secondary | ICD-10-CM | POA: Diagnosis not present

## 2014-05-13 DIAGNOSIS — I5033 Acute on chronic diastolic (congestive) heart failure: Secondary | ICD-10-CM | POA: Diagnosis not present

## 2014-05-13 DIAGNOSIS — M545 Low back pain: Secondary | ICD-10-CM | POA: Diagnosis not present

## 2014-05-13 DIAGNOSIS — R2681 Unsteadiness on feet: Secondary | ICD-10-CM | POA: Diagnosis not present

## 2014-05-13 DIAGNOSIS — G609 Hereditary and idiopathic neuropathy, unspecified: Secondary | ICD-10-CM | POA: Diagnosis not present

## 2014-05-13 NOTE — Telephone Encounter (Signed)
Order faxed.

## 2014-05-13 NOTE — Telephone Encounter (Signed)
Order written

## 2014-05-13 NOTE — Telephone Encounter (Signed)
Pt son [facility] stating that pt is scheduled to get an injection on Tuesday 05/19/14.  He needs an order faxed to [facility] facility to stop 81 mg Aspirin from Friday - Tuesday faxed to [facility].  Please advise.

## 2014-05-15 DIAGNOSIS — G609 Hereditary and idiopathic neuropathy, unspecified: Secondary | ICD-10-CM | POA: Diagnosis not present

## 2014-05-15 DIAGNOSIS — I5033 Acute on chronic diastolic (congestive) heart failure: Secondary | ICD-10-CM | POA: Diagnosis not present

## 2014-05-15 DIAGNOSIS — M19041 Primary osteoarthritis, right hand: Secondary | ICD-10-CM | POA: Diagnosis not present

## 2014-05-15 DIAGNOSIS — G8929 Other chronic pain: Secondary | ICD-10-CM | POA: Diagnosis not present

## 2014-05-15 DIAGNOSIS — R2681 Unsteadiness on feet: Secondary | ICD-10-CM | POA: Diagnosis not present

## 2014-05-15 DIAGNOSIS — M545 Low back pain: Secondary | ICD-10-CM | POA: Diagnosis not present

## 2014-05-18 DIAGNOSIS — R2681 Unsteadiness on feet: Secondary | ICD-10-CM | POA: Diagnosis not present

## 2014-05-18 DIAGNOSIS — M545 Low back pain: Secondary | ICD-10-CM | POA: Diagnosis not present

## 2014-05-18 DIAGNOSIS — G8929 Other chronic pain: Secondary | ICD-10-CM | POA: Diagnosis not present

## 2014-05-18 DIAGNOSIS — G609 Hereditary and idiopathic neuropathy, unspecified: Secondary | ICD-10-CM | POA: Diagnosis not present

## 2014-05-18 DIAGNOSIS — M19041 Primary osteoarthritis, right hand: Secondary | ICD-10-CM | POA: Diagnosis not present

## 2014-05-18 DIAGNOSIS — I5033 Acute on chronic diastolic (congestive) heart failure: Secondary | ICD-10-CM | POA: Diagnosis not present

## 2014-05-19 DIAGNOSIS — G609 Hereditary and idiopathic neuropathy, unspecified: Secondary | ICD-10-CM | POA: Diagnosis not present

## 2014-05-19 DIAGNOSIS — G8929 Other chronic pain: Secondary | ICD-10-CM | POA: Diagnosis not present

## 2014-05-19 DIAGNOSIS — M47816 Spondylosis without myelopathy or radiculopathy, lumbar region: Secondary | ICD-10-CM | POA: Diagnosis not present

## 2014-05-19 DIAGNOSIS — M545 Low back pain: Secondary | ICD-10-CM | POA: Diagnosis not present

## 2014-05-19 DIAGNOSIS — M19041 Primary osteoarthritis, right hand: Secondary | ICD-10-CM | POA: Diagnosis not present

## 2014-05-19 DIAGNOSIS — I5033 Acute on chronic diastolic (congestive) heart failure: Secondary | ICD-10-CM | POA: Diagnosis not present

## 2014-05-19 DIAGNOSIS — R2681 Unsteadiness on feet: Secondary | ICD-10-CM | POA: Diagnosis not present

## 2014-05-20 DIAGNOSIS — S46012D Strain of muscle(s) and tendon(s) of the rotator cuff of left shoulder, subsequent encounter: Secondary | ICD-10-CM | POA: Diagnosis not present

## 2014-05-21 DIAGNOSIS — M545 Low back pain: Secondary | ICD-10-CM | POA: Diagnosis not present

## 2014-05-21 DIAGNOSIS — G8929 Other chronic pain: Secondary | ICD-10-CM | POA: Diagnosis not present

## 2014-05-21 DIAGNOSIS — I5033 Acute on chronic diastolic (congestive) heart failure: Secondary | ICD-10-CM | POA: Diagnosis not present

## 2014-05-21 DIAGNOSIS — R2681 Unsteadiness on feet: Secondary | ICD-10-CM | POA: Diagnosis not present

## 2014-05-21 DIAGNOSIS — G609 Hereditary and idiopathic neuropathy, unspecified: Secondary | ICD-10-CM | POA: Diagnosis not present

## 2014-05-21 DIAGNOSIS — M19041 Primary osteoarthritis, right hand: Secondary | ICD-10-CM | POA: Diagnosis not present

## 2014-05-22 DIAGNOSIS — R2681 Unsteadiness on feet: Secondary | ICD-10-CM | POA: Diagnosis not present

## 2014-05-22 DIAGNOSIS — M19041 Primary osteoarthritis, right hand: Secondary | ICD-10-CM | POA: Diagnosis not present

## 2014-05-22 DIAGNOSIS — G8929 Other chronic pain: Secondary | ICD-10-CM | POA: Diagnosis not present

## 2014-05-22 DIAGNOSIS — G609 Hereditary and idiopathic neuropathy, unspecified: Secondary | ICD-10-CM | POA: Diagnosis not present

## 2014-05-22 DIAGNOSIS — M545 Low back pain: Secondary | ICD-10-CM | POA: Diagnosis not present

## 2014-05-22 DIAGNOSIS — I5033 Acute on chronic diastolic (congestive) heart failure: Secondary | ICD-10-CM | POA: Diagnosis not present

## 2014-05-25 DIAGNOSIS — I5033 Acute on chronic diastolic (congestive) heart failure: Secondary | ICD-10-CM | POA: Diagnosis not present

## 2014-05-25 DIAGNOSIS — G8929 Other chronic pain: Secondary | ICD-10-CM | POA: Diagnosis not present

## 2014-05-25 DIAGNOSIS — M545 Low back pain: Secondary | ICD-10-CM | POA: Diagnosis not present

## 2014-05-25 DIAGNOSIS — G609 Hereditary and idiopathic neuropathy, unspecified: Secondary | ICD-10-CM | POA: Diagnosis not present

## 2014-05-25 DIAGNOSIS — M19041 Primary osteoarthritis, right hand: Secondary | ICD-10-CM | POA: Diagnosis not present

## 2014-05-25 DIAGNOSIS — R2681 Unsteadiness on feet: Secondary | ICD-10-CM | POA: Diagnosis not present

## 2014-05-26 DIAGNOSIS — G609 Hereditary and idiopathic neuropathy, unspecified: Secondary | ICD-10-CM | POA: Diagnosis not present

## 2014-05-26 DIAGNOSIS — M19041 Primary osteoarthritis, right hand: Secondary | ICD-10-CM | POA: Diagnosis not present

## 2014-05-26 DIAGNOSIS — G8929 Other chronic pain: Secondary | ICD-10-CM | POA: Diagnosis not present

## 2014-05-26 DIAGNOSIS — R2681 Unsteadiness on feet: Secondary | ICD-10-CM | POA: Diagnosis not present

## 2014-05-26 DIAGNOSIS — M545 Low back pain: Secondary | ICD-10-CM | POA: Diagnosis not present

## 2014-05-26 DIAGNOSIS — I5033 Acute on chronic diastolic (congestive) heart failure: Secondary | ICD-10-CM | POA: Diagnosis not present

## 2014-05-28 ENCOUNTER — Encounter: Payer: Self-pay | Admitting: Family Medicine

## 2014-05-28 ENCOUNTER — Ambulatory Visit (INDEPENDENT_AMBULATORY_CARE_PROVIDER_SITE_OTHER): Payer: Medicare Other | Admitting: Family Medicine

## 2014-05-28 VITALS — BP 145/65 | HR 78 | Temp 97.4°F | Resp 18 | Ht 65.0 in | Wt 166.0 lb

## 2014-05-28 DIAGNOSIS — I872 Venous insufficiency (chronic) (peripheral): Secondary | ICD-10-CM | POA: Diagnosis not present

## 2014-05-28 DIAGNOSIS — M25512 Pain in left shoulder: Secondary | ICD-10-CM | POA: Diagnosis not present

## 2014-05-28 DIAGNOSIS — G609 Hereditary and idiopathic neuropathy, unspecified: Secondary | ICD-10-CM | POA: Diagnosis not present

## 2014-05-28 DIAGNOSIS — G8929 Other chronic pain: Secondary | ICD-10-CM | POA: Diagnosis not present

## 2014-05-28 DIAGNOSIS — R413 Other amnesia: Secondary | ICD-10-CM

## 2014-05-28 DIAGNOSIS — R2681 Unsteadiness on feet: Secondary | ICD-10-CM | POA: Diagnosis not present

## 2014-05-28 DIAGNOSIS — M5440 Lumbago with sciatica, unspecified side: Secondary | ICD-10-CM

## 2014-05-28 DIAGNOSIS — M545 Low back pain: Secondary | ICD-10-CM | POA: Diagnosis not present

## 2014-05-28 DIAGNOSIS — M19041 Primary osteoarthritis, right hand: Secondary | ICD-10-CM | POA: Diagnosis not present

## 2014-05-28 DIAGNOSIS — I5033 Acute on chronic diastolic (congestive) heart failure: Secondary | ICD-10-CM | POA: Diagnosis not present

## 2014-05-28 MED ORDER — HYDROCODONE-ACETAMINOPHEN 5-325 MG PO TABS: 1.0000 | ORAL_TABLET | Freq: Four times a day (QID) | ORAL | Status: DC | PRN

## 2014-05-28 NOTE — Progress Notes (Signed)
Pre visit review using our clinic review tool, if applicable. No additional management support is needed unless otherwise documented below in the visit note. 
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
## 2014-05-29 DIAGNOSIS — G609 Hereditary and idiopathic neuropathy, unspecified: Secondary | ICD-10-CM | POA: Diagnosis not present

## 2014-05-29 DIAGNOSIS — M545 Low back pain: Secondary | ICD-10-CM | POA: Diagnosis not present

## 2014-05-29 DIAGNOSIS — G8929 Other chronic pain: Secondary | ICD-10-CM | POA: Diagnosis not present

## 2014-05-29 DIAGNOSIS — M19041 Primary osteoarthritis, right hand: Secondary | ICD-10-CM | POA: Diagnosis not present

## 2014-05-29 DIAGNOSIS — I5033 Acute on chronic diastolic (congestive) heart failure: Secondary | ICD-10-CM | POA: Diagnosis not present

## 2014-05-29 DIAGNOSIS — R2681 Unsteadiness on feet: Secondary | ICD-10-CM | POA: Diagnosis not present

## 2014-06-01 DIAGNOSIS — G8929 Other chronic pain: Secondary | ICD-10-CM | POA: Diagnosis not present

## 2014-06-01 DIAGNOSIS — M19041 Primary osteoarthritis, right hand: Secondary | ICD-10-CM | POA: Diagnosis not present

## 2014-06-01 DIAGNOSIS — I5033 Acute on chronic diastolic (congestive) heart failure: Secondary | ICD-10-CM | POA: Diagnosis not present

## 2014-06-01 DIAGNOSIS — R2681 Unsteadiness on feet: Secondary | ICD-10-CM | POA: Diagnosis not present

## 2014-06-01 DIAGNOSIS — M545 Low back pain: Secondary | ICD-10-CM | POA: Diagnosis not present

## 2014-06-01 DIAGNOSIS — G609 Hereditary and idiopathic neuropathy, unspecified: Secondary | ICD-10-CM | POA: Diagnosis not present

## 2014-06-02 DIAGNOSIS — S46012D Strain of muscle(s) and tendon(s) of the rotator cuff of left shoulder, subsequent encounter: Secondary | ICD-10-CM | POA: Diagnosis not present

## 2014-06-02 DIAGNOSIS — G609 Hereditary and idiopathic neuropathy, unspecified: Secondary | ICD-10-CM | POA: Diagnosis not present

## 2014-06-02 DIAGNOSIS — M545 Low back pain: Secondary | ICD-10-CM | POA: Diagnosis not present

## 2014-06-02 DIAGNOSIS — I5033 Acute on chronic diastolic (congestive) heart failure: Secondary | ICD-10-CM | POA: Diagnosis not present

## 2014-06-02 DIAGNOSIS — M47816 Spondylosis without myelopathy or radiculopathy, lumbar region: Secondary | ICD-10-CM | POA: Diagnosis not present

## 2014-06-02 DIAGNOSIS — M19041 Primary osteoarthritis, right hand: Secondary | ICD-10-CM | POA: Diagnosis not present

## 2014-06-02 DIAGNOSIS — G8929 Other chronic pain: Secondary | ICD-10-CM | POA: Diagnosis not present

## 2014-06-02 DIAGNOSIS — R2681 Unsteadiness on feet: Secondary | ICD-10-CM | POA: Diagnosis not present

## 2014-06-04 DIAGNOSIS — M19041 Primary osteoarthritis, right hand: Secondary | ICD-10-CM | POA: Diagnosis not present

## 2014-06-04 DIAGNOSIS — I5033 Acute on chronic diastolic (congestive) heart failure: Secondary | ICD-10-CM | POA: Diagnosis not present

## 2014-06-04 DIAGNOSIS — R2681 Unsteadiness on feet: Secondary | ICD-10-CM | POA: Diagnosis not present

## 2014-06-04 DIAGNOSIS — G609 Hereditary and idiopathic neuropathy, unspecified: Secondary | ICD-10-CM | POA: Diagnosis not present

## 2014-06-04 DIAGNOSIS — G8929 Other chronic pain: Secondary | ICD-10-CM | POA: Diagnosis not present

## 2014-06-04 DIAGNOSIS — M545 Low back pain: Secondary | ICD-10-CM | POA: Diagnosis not present

## 2014-06-05 DIAGNOSIS — M19041 Primary osteoarthritis, right hand: Secondary | ICD-10-CM | POA: Diagnosis not present

## 2014-06-05 DIAGNOSIS — G609 Hereditary and idiopathic neuropathy, unspecified: Secondary | ICD-10-CM | POA: Diagnosis not present

## 2014-06-05 DIAGNOSIS — M545 Low back pain: Secondary | ICD-10-CM | POA: Diagnosis not present

## 2014-06-05 DIAGNOSIS — R2681 Unsteadiness on feet: Secondary | ICD-10-CM | POA: Diagnosis not present

## 2014-06-05 DIAGNOSIS — I5033 Acute on chronic diastolic (congestive) heart failure: Secondary | ICD-10-CM | POA: Diagnosis not present

## 2014-06-05 DIAGNOSIS — G8929 Other chronic pain: Secondary | ICD-10-CM | POA: Diagnosis not present

## 2014-06-07 DIAGNOSIS — M19041 Primary osteoarthritis, right hand: Secondary | ICD-10-CM | POA: Diagnosis not present

## 2014-06-07 DIAGNOSIS — G8929 Other chronic pain: Secondary | ICD-10-CM | POA: Diagnosis not present

## 2014-06-07 DIAGNOSIS — G609 Hereditary and idiopathic neuropathy, unspecified: Secondary | ICD-10-CM | POA: Diagnosis not present

## 2014-06-07 DIAGNOSIS — R2681 Unsteadiness on feet: Secondary | ICD-10-CM | POA: Diagnosis not present

## 2014-06-07 DIAGNOSIS — M545 Low back pain: Secondary | ICD-10-CM | POA: Diagnosis not present

## 2014-06-07 DIAGNOSIS — I5033 Acute on chronic diastolic (congestive) heart failure: Secondary | ICD-10-CM | POA: Diagnosis not present

## 2014-06-08 DIAGNOSIS — I5033 Acute on chronic diastolic (congestive) heart failure: Secondary | ICD-10-CM | POA: Diagnosis not present

## 2014-06-08 DIAGNOSIS — G8929 Other chronic pain: Secondary | ICD-10-CM | POA: Diagnosis not present

## 2014-06-08 DIAGNOSIS — M19041 Primary osteoarthritis, right hand: Secondary | ICD-10-CM | POA: Diagnosis not present

## 2014-06-08 DIAGNOSIS — M545 Low back pain: Secondary | ICD-10-CM | POA: Diagnosis not present

## 2014-06-08 DIAGNOSIS — R2681 Unsteadiness on feet: Secondary | ICD-10-CM | POA: Diagnosis not present

## 2014-06-08 DIAGNOSIS — G609 Hereditary and idiopathic neuropathy, unspecified: Secondary | ICD-10-CM | POA: Diagnosis not present

## 2014-06-10 DIAGNOSIS — M545 Low back pain: Secondary | ICD-10-CM | POA: Diagnosis not present

## 2014-06-10 DIAGNOSIS — G8929 Other chronic pain: Secondary | ICD-10-CM | POA: Diagnosis not present

## 2014-06-10 DIAGNOSIS — R2681 Unsteadiness on feet: Secondary | ICD-10-CM | POA: Diagnosis not present

## 2014-06-10 DIAGNOSIS — M19041 Primary osteoarthritis, right hand: Secondary | ICD-10-CM | POA: Diagnosis not present

## 2014-06-10 DIAGNOSIS — I5033 Acute on chronic diastolic (congestive) heart failure: Secondary | ICD-10-CM | POA: Diagnosis not present

## 2014-06-10 DIAGNOSIS — G609 Hereditary and idiopathic neuropathy, unspecified: Secondary | ICD-10-CM | POA: Diagnosis not present

## 2014-06-11 DIAGNOSIS — I5033 Acute on chronic diastolic (congestive) heart failure: Secondary | ICD-10-CM | POA: Diagnosis not present

## 2014-06-11 DIAGNOSIS — M19041 Primary osteoarthritis, right hand: Secondary | ICD-10-CM | POA: Diagnosis not present

## 2014-06-11 DIAGNOSIS — G609 Hereditary and idiopathic neuropathy, unspecified: Secondary | ICD-10-CM | POA: Diagnosis not present

## 2014-06-11 DIAGNOSIS — G8929 Other chronic pain: Secondary | ICD-10-CM | POA: Diagnosis not present

## 2014-06-11 DIAGNOSIS — M545 Low back pain: Secondary | ICD-10-CM | POA: Diagnosis not present

## 2014-06-11 DIAGNOSIS — R2681 Unsteadiness on feet: Secondary | ICD-10-CM | POA: Diagnosis not present

## 2014-06-14 DIAGNOSIS — G8929 Other chronic pain: Secondary | ICD-10-CM | POA: Diagnosis not present

## 2014-06-14 DIAGNOSIS — I5033 Acute on chronic diastolic (congestive) heart failure: Secondary | ICD-10-CM | POA: Diagnosis not present

## 2014-06-14 DIAGNOSIS — M545 Low back pain: Secondary | ICD-10-CM | POA: Diagnosis not present

## 2014-06-14 DIAGNOSIS — G609 Hereditary and idiopathic neuropathy, unspecified: Secondary | ICD-10-CM | POA: Diagnosis not present

## 2014-06-14 DIAGNOSIS — R2681 Unsteadiness on feet: Secondary | ICD-10-CM | POA: Diagnosis not present

## 2014-06-14 DIAGNOSIS — M19041 Primary osteoarthritis, right hand: Secondary | ICD-10-CM | POA: Diagnosis not present

## 2014-06-15 DIAGNOSIS — G609 Hereditary and idiopathic neuropathy, unspecified: Secondary | ICD-10-CM | POA: Diagnosis not present

## 2014-06-15 DIAGNOSIS — G8929 Other chronic pain: Secondary | ICD-10-CM | POA: Diagnosis not present

## 2014-06-15 DIAGNOSIS — R2681 Unsteadiness on feet: Secondary | ICD-10-CM | POA: Diagnosis not present

## 2014-06-15 DIAGNOSIS — I5033 Acute on chronic diastolic (congestive) heart failure: Secondary | ICD-10-CM | POA: Diagnosis not present

## 2014-06-15 DIAGNOSIS — M545 Low back pain: Secondary | ICD-10-CM | POA: Diagnosis not present

## 2014-06-15 DIAGNOSIS — M19041 Primary osteoarthritis, right hand: Secondary | ICD-10-CM | POA: Diagnosis not present

## 2014-06-17 DIAGNOSIS — M19041 Primary osteoarthritis, right hand: Secondary | ICD-10-CM | POA: Diagnosis not present

## 2014-06-17 DIAGNOSIS — R2681 Unsteadiness on feet: Secondary | ICD-10-CM | POA: Diagnosis not present

## 2014-06-17 DIAGNOSIS — G8929 Other chronic pain: Secondary | ICD-10-CM | POA: Diagnosis not present

## 2014-06-17 DIAGNOSIS — M545 Low back pain: Secondary | ICD-10-CM | POA: Diagnosis not present

## 2014-06-17 DIAGNOSIS — G609 Hereditary and idiopathic neuropathy, unspecified: Secondary | ICD-10-CM | POA: Diagnosis not present

## 2014-06-17 DIAGNOSIS — I5033 Acute on chronic diastolic (congestive) heart failure: Secondary | ICD-10-CM | POA: Diagnosis not present

## 2014-06-19 DIAGNOSIS — F0390 Unspecified dementia without behavioral disturbance: Secondary | ICD-10-CM

## 2014-06-19 HISTORY — DX: Unspecified dementia, unspecified severity, without behavioral disturbance, psychotic disturbance, mood disturbance, and anxiety: F03.90

## 2014-06-22 DIAGNOSIS — M19041 Primary osteoarthritis, right hand: Secondary | ICD-10-CM | POA: Diagnosis not present

## 2014-06-22 DIAGNOSIS — R2681 Unsteadiness on feet: Secondary | ICD-10-CM | POA: Diagnosis not present

## 2014-06-22 DIAGNOSIS — I5033 Acute on chronic diastolic (congestive) heart failure: Secondary | ICD-10-CM | POA: Diagnosis not present

## 2014-06-22 DIAGNOSIS — G8929 Other chronic pain: Secondary | ICD-10-CM | POA: Diagnosis not present

## 2014-06-22 DIAGNOSIS — G609 Hereditary and idiopathic neuropathy, unspecified: Secondary | ICD-10-CM | POA: Diagnosis not present

## 2014-06-22 DIAGNOSIS — M545 Low back pain: Secondary | ICD-10-CM | POA: Diagnosis not present

## 2014-06-23 DIAGNOSIS — G8929 Other chronic pain: Secondary | ICD-10-CM | POA: Diagnosis not present

## 2014-06-23 DIAGNOSIS — G609 Hereditary and idiopathic neuropathy, unspecified: Secondary | ICD-10-CM | POA: Diagnosis not present

## 2014-06-23 DIAGNOSIS — M19041 Primary osteoarthritis, right hand: Secondary | ICD-10-CM | POA: Diagnosis not present

## 2014-06-23 DIAGNOSIS — I5033 Acute on chronic diastolic (congestive) heart failure: Secondary | ICD-10-CM | POA: Diagnosis not present

## 2014-06-23 DIAGNOSIS — M545 Low back pain: Secondary | ICD-10-CM | POA: Diagnosis not present

## 2014-06-23 DIAGNOSIS — R2681 Unsteadiness on feet: Secondary | ICD-10-CM | POA: Diagnosis not present

## 2014-06-24 DIAGNOSIS — Z9181 History of falling: Secondary | ICD-10-CM | POA: Diagnosis not present

## 2014-06-24 DIAGNOSIS — M19041 Primary osteoarthritis, right hand: Secondary | ICD-10-CM | POA: Diagnosis not present

## 2014-06-24 DIAGNOSIS — N183 Chronic kidney disease, stage 3 (moderate): Secondary | ICD-10-CM | POA: Diagnosis not present

## 2014-06-24 DIAGNOSIS — I5033 Acute on chronic diastolic (congestive) heart failure: Secondary | ICD-10-CM | POA: Diagnosis not present

## 2014-06-24 DIAGNOSIS — I129 Hypertensive chronic kidney disease with stage 1 through stage 4 chronic kidney disease, or unspecified chronic kidney disease: Secondary | ICD-10-CM | POA: Diagnosis not present

## 2014-06-24 DIAGNOSIS — M19042 Primary osteoarthritis, left hand: Secondary | ICD-10-CM | POA: Diagnosis not present

## 2014-06-24 DIAGNOSIS — M6281 Muscle weakness (generalized): Secondary | ICD-10-CM | POA: Diagnosis not present

## 2014-06-24 DIAGNOSIS — R278 Other lack of coordination: Secondary | ICD-10-CM | POA: Diagnosis not present

## 2014-06-24 DIAGNOSIS — G8929 Other chronic pain: Secondary | ICD-10-CM | POA: Diagnosis not present

## 2014-06-24 DIAGNOSIS — M545 Low back pain: Secondary | ICD-10-CM | POA: Diagnosis not present

## 2014-06-24 DIAGNOSIS — F329 Major depressive disorder, single episode, unspecified: Secondary | ICD-10-CM | POA: Diagnosis not present

## 2014-06-24 DIAGNOSIS — G609 Hereditary and idiopathic neuropathy, unspecified: Secondary | ICD-10-CM | POA: Diagnosis not present

## 2014-06-24 DIAGNOSIS — M25512 Pain in left shoulder: Secondary | ICD-10-CM | POA: Diagnosis not present

## 2014-06-26 ENCOUNTER — Ambulatory Visit (HOSPITAL_BASED_OUTPATIENT_CLINIC_OR_DEPARTMENT_OTHER)
Admission: RE | Admit: 2014-06-26 | Discharge: 2014-06-26 | Disposition: A | Discharge status: Home / Self Care | Payer: Medicare Other | Source: Ambulatory Visit | Attending: Family Medicine | Admitting: Family Medicine

## 2014-06-26 ENCOUNTER — Encounter: Payer: Self-pay | Admitting: Family Medicine

## 2014-06-26 ENCOUNTER — Ambulatory Visit (INDEPENDENT_AMBULATORY_CARE_PROVIDER_SITE_OTHER): Payer: Medicare Other | Admitting: Family Medicine

## 2014-06-26 VITALS — BP 127/80 | HR 72 | Temp 98.4°F | Ht 65.0 in | Wt 158.0 lb

## 2014-06-26 DIAGNOSIS — M6281 Muscle weakness (generalized): Secondary | ICD-10-CM | POA: Diagnosis not present

## 2014-06-26 DIAGNOSIS — T887XXA Unspecified adverse effect of drug or medicament, initial encounter: Secondary | ICD-10-CM | POA: Diagnosis not present

## 2014-06-26 DIAGNOSIS — M545 Low back pain: Secondary | ICD-10-CM | POA: Diagnosis not present

## 2014-06-26 DIAGNOSIS — R059 Cough, unspecified: Secondary | ICD-10-CM

## 2014-06-26 DIAGNOSIS — R0989 Other specified symptoms and signs involving the circulatory and respiratory systems: Secondary | ICD-10-CM

## 2014-06-26 DIAGNOSIS — I5033 Acute on chronic diastolic (congestive) heart failure: Secondary | ICD-10-CM | POA: Diagnosis not present

## 2014-06-26 DIAGNOSIS — M25512 Pain in left shoulder: Secondary | ICD-10-CM | POA: Diagnosis not present

## 2014-06-26 DIAGNOSIS — R05 Cough: Secondary | ICD-10-CM | POA: Diagnosis not present

## 2014-06-26 DIAGNOSIS — T50905A Adverse effect of unspecified drugs, medicaments and biological substances, initial encounter: Secondary | ICD-10-CM

## 2014-06-26 DIAGNOSIS — G609 Hereditary and idiopathic neuropathy, unspecified: Secondary | ICD-10-CM | POA: Diagnosis not present

## 2014-06-26 DIAGNOSIS — G8929 Other chronic pain: Secondary | ICD-10-CM | POA: Diagnosis not present

## 2014-06-26 NOTE — Progress Notes (Signed)
OFFICE NOTE  06/26/2014  CC:  Chief Complaint  Patient presents with  . Medication Problem    HPI: Patient is a 81 y.o. Caucasian male who is here for "medication problem" plus respiratory illness. Approx 2 wks cough, some SOB, increased LE swelling lately.  No fever.  No HA or ST.  Very tired/wants to sleep a lot. They have been giving him his vicodin 5/325 q6h scheduled INSTEAD of prn and this is playing a role in his lethargy.  Pertinent PMH:  Past medical, surgical, social, and family history reviewed and no changes are noted since last office visit.  MEDS:  Outpatient Prescriptions Prior to Visit  Medication Sig Dispense Refill  . aspirin 81 MG chewable tablet Chew 81 mg by mouth daily.    Marland Kitchen atorvastatin (LIPITOR) 20 MG tablet Take 20 mg by mouth every evening.     Marland Kitchen azelastine (OPTIVAR) 0.05 % ophthalmic solution Place 1 drop into the right eye 2 (two) times daily.     . baclofen (LIORESAL) 10 MG tablet Take 10 mg by mouth 3 (three) times daily as needed for muscle spasms.     . finasteride (PROSCAR) 5 MG tablet Take 1 tablet (5 mg total) by mouth daily. 30 tablet 6  . furosemide (LASIX) 40 MG tablet Take 40 mg by mouth daily.    . hydrALAZINE (APRESOLINE) 10 MG tablet Take 10 mg by mouth 3 (three) times daily.     Marland Kitchen HYDROcodone-acetaminophen (NORCO/VICODIN) 5-325 MG per tablet Take 1-2 tablets by mouth every 6 (six) hours as needed for moderate pain. 30 tablet 0  . LEVOBUNOLOL HCL OP Apply to eye.    . meclizine (ANTIVERT) 25 MG tablet Take 12.5 mg by mouth 2 (two) times daily as needed for dizziness.    . tamsulosin (FLOMAX) 0.4 MG CAPS capsule Take 2 capsules (0.8 mg total) by mouth daily after supper. 60 capsule 6  . temazepam (RESTORIL) 7.5 MG capsule Take 1 capsule (7.5 mg total) by mouth at bedtime as needed for sleep. 30 capsule 5   No facility-administered medications prior to visit.    PE: Blood pressure 127/80, pulse 72, temperature 98.4 F (36.9 C), temperature  source Temporal, height 5\' 5"  (1.651 m), weight 158 lb (71.668 kg), SpO2 94 %. Gen: Alert, tired appearing.  Patient is oriented to person, place, time, and situation. RRR, no m/r/g LUNGS: nonlabored resps, good aeration, bibasilar soft early insp crackles noted. EXT: tight 3-4+ pitting edema in both LL's below knees, no weeping.  He is wearing compression stockings.  LAB: none today RECENT:    Chemistry      Component Value Date/Time   NA 137 04/16/2014 1529   K 4.8 04/16/2014 1529   CL 103 04/16/2014 1529   CO2 25 04/16/2014 1529   BUN 29* 04/16/2014 1529   CREATININE 1.3 04/16/2014 1529   CREATININE 1.44* 01/16/2014 1658      Component Value Date/Time   CALCIUM 8.8 04/16/2014 1529   ALKPHOS 78 02/19/2014 0909   AST 20 02/19/2014 0909   ALT 17 02/19/2014 0909   BILITOT 0.4 02/19/2014 0909      IMPRESSION AND PLAN:  1) Cough: ? URI with possible LRTI as well.  Also could have mild pulm edema. Sent pt for CXR today, no new meds at this moment.  2) Stop vicodin scheduled: make sure they change to q6h prn.  An After Visit Summary was printed and given to the patient.  FOLLOW UP: prn

## 2014-06-26 NOTE — Progress Notes (Signed)
Pre visit review using our clinic review tool, if applicable. No additional management support is needed unless otherwise documented below in the visit note. 

## 2014-06-29 DIAGNOSIS — M25512 Pain in left shoulder: Secondary | ICD-10-CM | POA: Diagnosis not present

## 2014-06-29 DIAGNOSIS — M6281 Muscle weakness (generalized): Secondary | ICD-10-CM | POA: Diagnosis not present

## 2014-06-29 DIAGNOSIS — M545 Low back pain: Secondary | ICD-10-CM | POA: Diagnosis not present

## 2014-06-29 DIAGNOSIS — G609 Hereditary and idiopathic neuropathy, unspecified: Secondary | ICD-10-CM | POA: Diagnosis not present

## 2014-06-29 DIAGNOSIS — G8929 Other chronic pain: Secondary | ICD-10-CM | POA: Diagnosis not present

## 2014-06-29 DIAGNOSIS — I5033 Acute on chronic diastolic (congestive) heart failure: Secondary | ICD-10-CM | POA: Diagnosis not present

## 2014-06-30 DIAGNOSIS — G8929 Other chronic pain: Secondary | ICD-10-CM | POA: Diagnosis not present

## 2014-06-30 DIAGNOSIS — I5033 Acute on chronic diastolic (congestive) heart failure: Secondary | ICD-10-CM | POA: Diagnosis not present

## 2014-06-30 DIAGNOSIS — M6281 Muscle weakness (generalized): Secondary | ICD-10-CM | POA: Diagnosis not present

## 2014-06-30 DIAGNOSIS — G609 Hereditary and idiopathic neuropathy, unspecified: Secondary | ICD-10-CM | POA: Diagnosis not present

## 2014-06-30 DIAGNOSIS — M25512 Pain in left shoulder: Secondary | ICD-10-CM | POA: Diagnosis not present

## 2014-06-30 DIAGNOSIS — M545 Low back pain: Secondary | ICD-10-CM | POA: Diagnosis not present

## 2014-07-01 DIAGNOSIS — G8929 Other chronic pain: Secondary | ICD-10-CM | POA: Diagnosis not present

## 2014-07-01 DIAGNOSIS — M545 Low back pain: Secondary | ICD-10-CM | POA: Diagnosis not present

## 2014-07-01 DIAGNOSIS — I5033 Acute on chronic diastolic (congestive) heart failure: Secondary | ICD-10-CM

## 2014-07-01 DIAGNOSIS — M25512 Pain in left shoulder: Secondary | ICD-10-CM

## 2014-07-02 ENCOUNTER — Other Ambulatory Visit: Payer: Self-pay | Admitting: Family Medicine

## 2014-07-02 ENCOUNTER — Telehealth: Payer: Self-pay

## 2014-07-02 DIAGNOSIS — M545 Low back pain: Secondary | ICD-10-CM | POA: Diagnosis not present

## 2014-07-02 DIAGNOSIS — G609 Hereditary and idiopathic neuropathy, unspecified: Secondary | ICD-10-CM | POA: Diagnosis not present

## 2014-07-02 DIAGNOSIS — M6281 Muscle weakness (generalized): Secondary | ICD-10-CM | POA: Diagnosis not present

## 2014-07-02 DIAGNOSIS — B351 Tinea unguium: Secondary | ICD-10-CM | POA: Diagnosis not present

## 2014-07-02 DIAGNOSIS — I5033 Acute on chronic diastolic (congestive) heart failure: Secondary | ICD-10-CM | POA: Diagnosis not present

## 2014-07-02 DIAGNOSIS — M79675 Pain in left toe(s): Secondary | ICD-10-CM | POA: Diagnosis not present

## 2014-07-02 DIAGNOSIS — G8929 Other chronic pain: Secondary | ICD-10-CM | POA: Diagnosis not present

## 2014-07-02 DIAGNOSIS — M79674 Pain in right toe(s): Secondary | ICD-10-CM | POA: Diagnosis not present

## 2014-07-02 DIAGNOSIS — M25512 Pain in left shoulder: Secondary | ICD-10-CM | POA: Diagnosis not present

## 2014-07-02 MED ORDER — HYDROCODONE-ACETAMINOPHEN 5-325 MG PO TABS: 1.0000 | ORAL_TABLET | Freq: Four times a day (QID) | ORAL | Status: DC | PRN

## 2014-07-02 NOTE — Telephone Encounter (Signed)
A nurse called from [facility], requesting a prescription for pt's pain medication as soon as possible. She states he ran out and if we could please fax it to [phone]. Hydrocodone 5-325mg  please.

## 2014-07-02 NOTE — Telephone Encounter (Signed)
Vicodin rx printed. 

## 2014-07-02 NOTE — Telephone Encounter (Signed)
Rx faxed per Request.

## 2014-07-03 DIAGNOSIS — G8929 Other chronic pain: Secondary | ICD-10-CM | POA: Diagnosis not present

## 2014-07-03 DIAGNOSIS — I5033 Acute on chronic diastolic (congestive) heart failure: Secondary | ICD-10-CM | POA: Diagnosis not present

## 2014-07-03 DIAGNOSIS — G609 Hereditary and idiopathic neuropathy, unspecified: Secondary | ICD-10-CM | POA: Diagnosis not present

## 2014-07-03 DIAGNOSIS — M25512 Pain in left shoulder: Secondary | ICD-10-CM | POA: Diagnosis not present

## 2014-07-03 DIAGNOSIS — M545 Low back pain: Secondary | ICD-10-CM | POA: Diagnosis not present

## 2014-07-03 DIAGNOSIS — M6281 Muscle weakness (generalized): Secondary | ICD-10-CM | POA: Diagnosis not present

## 2014-07-06 DIAGNOSIS — G8929 Other chronic pain: Secondary | ICD-10-CM | POA: Diagnosis not present

## 2014-07-06 DIAGNOSIS — I5033 Acute on chronic diastolic (congestive) heart failure: Secondary | ICD-10-CM | POA: Diagnosis not present

## 2014-07-06 DIAGNOSIS — G609 Hereditary and idiopathic neuropathy, unspecified: Secondary | ICD-10-CM | POA: Diagnosis not present

## 2014-07-06 DIAGNOSIS — M6281 Muscle weakness (generalized): Secondary | ICD-10-CM | POA: Diagnosis not present

## 2014-07-06 DIAGNOSIS — M545 Low back pain: Secondary | ICD-10-CM | POA: Diagnosis not present

## 2014-07-06 DIAGNOSIS — M25512 Pain in left shoulder: Secondary | ICD-10-CM | POA: Diagnosis not present

## 2014-07-07 DIAGNOSIS — G609 Hereditary and idiopathic neuropathy, unspecified: Secondary | ICD-10-CM | POA: Diagnosis not present

## 2014-07-07 DIAGNOSIS — M25512 Pain in left shoulder: Secondary | ICD-10-CM | POA: Diagnosis not present

## 2014-07-07 DIAGNOSIS — I5033 Acute on chronic diastolic (congestive) heart failure: Secondary | ICD-10-CM | POA: Diagnosis not present

## 2014-07-07 DIAGNOSIS — M545 Low back pain: Secondary | ICD-10-CM | POA: Diagnosis not present

## 2014-07-07 DIAGNOSIS — M6281 Muscle weakness (generalized): Secondary | ICD-10-CM | POA: Diagnosis not present

## 2014-07-07 DIAGNOSIS — G8929 Other chronic pain: Secondary | ICD-10-CM | POA: Diagnosis not present

## 2014-07-09 DIAGNOSIS — M6281 Muscle weakness (generalized): Secondary | ICD-10-CM | POA: Diagnosis not present

## 2014-07-09 DIAGNOSIS — G609 Hereditary and idiopathic neuropathy, unspecified: Secondary | ICD-10-CM | POA: Diagnosis not present

## 2014-07-09 DIAGNOSIS — I5033 Acute on chronic diastolic (congestive) heart failure: Secondary | ICD-10-CM | POA: Diagnosis not present

## 2014-07-09 DIAGNOSIS — G8929 Other chronic pain: Secondary | ICD-10-CM | POA: Diagnosis not present

## 2014-07-09 DIAGNOSIS — M545 Low back pain: Secondary | ICD-10-CM | POA: Diagnosis not present

## 2014-07-09 DIAGNOSIS — M25512 Pain in left shoulder: Secondary | ICD-10-CM | POA: Diagnosis not present

## 2014-07-13 ENCOUNTER — Telehealth: Payer: Self-pay | Admitting: Neurology

## 2014-07-13 DIAGNOSIS — M6281 Muscle weakness (generalized): Secondary | ICD-10-CM | POA: Diagnosis not present

## 2014-07-13 DIAGNOSIS — M25512 Pain in left shoulder: Secondary | ICD-10-CM | POA: Diagnosis not present

## 2014-07-13 DIAGNOSIS — I5033 Acute on chronic diastolic (congestive) heart failure: Secondary | ICD-10-CM | POA: Diagnosis not present

## 2014-07-13 DIAGNOSIS — G609 Hereditary and idiopathic neuropathy, unspecified: Secondary | ICD-10-CM | POA: Diagnosis not present

## 2014-07-13 DIAGNOSIS — M545 Low back pain: Secondary | ICD-10-CM | POA: Diagnosis not present

## 2014-07-13 DIAGNOSIS — G8929 Other chronic pain: Secondary | ICD-10-CM | POA: Diagnosis not present

## 2014-07-13 NOTE — Telephone Encounter (Signed)
Pt resch appt form 07-14-14 to 08-24-14 and referring dr office notified

## 2014-07-14 ENCOUNTER — Ambulatory Visit: Payer: Medicare Other | Admitting: Neurology

## 2014-07-14 DIAGNOSIS — M545 Low back pain: Secondary | ICD-10-CM | POA: Diagnosis not present

## 2014-07-14 DIAGNOSIS — G8929 Other chronic pain: Secondary | ICD-10-CM | POA: Diagnosis not present

## 2014-07-14 DIAGNOSIS — M6281 Muscle weakness (generalized): Secondary | ICD-10-CM | POA: Diagnosis not present

## 2014-07-14 DIAGNOSIS — I5033 Acute on chronic diastolic (congestive) heart failure: Secondary | ICD-10-CM | POA: Diagnosis not present

## 2014-07-14 DIAGNOSIS — G609 Hereditary and idiopathic neuropathy, unspecified: Secondary | ICD-10-CM | POA: Diagnosis not present

## 2014-07-14 DIAGNOSIS — M25512 Pain in left shoulder: Secondary | ICD-10-CM | POA: Diagnosis not present

## 2014-07-15 DIAGNOSIS — I5033 Acute on chronic diastolic (congestive) heart failure: Secondary | ICD-10-CM | POA: Diagnosis not present

## 2014-07-15 DIAGNOSIS — M545 Low back pain: Secondary | ICD-10-CM | POA: Diagnosis not present

## 2014-07-15 DIAGNOSIS — M25512 Pain in left shoulder: Secondary | ICD-10-CM | POA: Diagnosis not present

## 2014-07-15 DIAGNOSIS — G8929 Other chronic pain: Secondary | ICD-10-CM | POA: Diagnosis not present

## 2014-07-15 DIAGNOSIS — M6281 Muscle weakness (generalized): Secondary | ICD-10-CM | POA: Diagnosis not present

## 2014-07-15 DIAGNOSIS — G609 Hereditary and idiopathic neuropathy, unspecified: Secondary | ICD-10-CM | POA: Diagnosis not present

## 2014-07-16 DIAGNOSIS — M25512 Pain in left shoulder: Secondary | ICD-10-CM | POA: Diagnosis not present

## 2014-07-16 DIAGNOSIS — M545 Low back pain: Secondary | ICD-10-CM | POA: Diagnosis not present

## 2014-07-16 DIAGNOSIS — G609 Hereditary and idiopathic neuropathy, unspecified: Secondary | ICD-10-CM | POA: Diagnosis not present

## 2014-07-16 DIAGNOSIS — G8929 Other chronic pain: Secondary | ICD-10-CM | POA: Diagnosis not present

## 2014-07-16 DIAGNOSIS — I5033 Acute on chronic diastolic (congestive) heart failure: Secondary | ICD-10-CM | POA: Diagnosis not present

## 2014-07-16 DIAGNOSIS — M6281 Muscle weakness (generalized): Secondary | ICD-10-CM | POA: Diagnosis not present

## 2014-07-20 DIAGNOSIS — G8929 Other chronic pain: Secondary | ICD-10-CM | POA: Diagnosis not present

## 2014-07-20 DIAGNOSIS — I5033 Acute on chronic diastolic (congestive) heart failure: Secondary | ICD-10-CM | POA: Diagnosis not present

## 2014-07-20 DIAGNOSIS — M6281 Muscle weakness (generalized): Secondary | ICD-10-CM | POA: Diagnosis not present

## 2014-07-20 DIAGNOSIS — M545 Low back pain: Secondary | ICD-10-CM | POA: Diagnosis not present

## 2014-07-20 DIAGNOSIS — G609 Hereditary and idiopathic neuropathy, unspecified: Secondary | ICD-10-CM | POA: Diagnosis not present

## 2014-07-20 DIAGNOSIS — M25512 Pain in left shoulder: Secondary | ICD-10-CM | POA: Diagnosis not present

## 2014-07-22 DIAGNOSIS — G609 Hereditary and idiopathic neuropathy, unspecified: Secondary | ICD-10-CM | POA: Diagnosis not present

## 2014-07-22 DIAGNOSIS — M25512 Pain in left shoulder: Secondary | ICD-10-CM | POA: Diagnosis not present

## 2014-07-22 DIAGNOSIS — M6281 Muscle weakness (generalized): Secondary | ICD-10-CM | POA: Diagnosis not present

## 2014-07-22 DIAGNOSIS — I5033 Acute on chronic diastolic (congestive) heart failure: Secondary | ICD-10-CM | POA: Diagnosis not present

## 2014-07-22 DIAGNOSIS — M545 Low back pain: Secondary | ICD-10-CM | POA: Diagnosis not present

## 2014-07-22 DIAGNOSIS — G8929 Other chronic pain: Secondary | ICD-10-CM | POA: Diagnosis not present

## 2014-07-23 DIAGNOSIS — M6281 Muscle weakness (generalized): Secondary | ICD-10-CM | POA: Diagnosis not present

## 2014-07-23 DIAGNOSIS — G609 Hereditary and idiopathic neuropathy, unspecified: Secondary | ICD-10-CM | POA: Diagnosis not present

## 2014-07-23 DIAGNOSIS — M25512 Pain in left shoulder: Secondary | ICD-10-CM | POA: Diagnosis not present

## 2014-07-23 DIAGNOSIS — M545 Low back pain: Secondary | ICD-10-CM | POA: Diagnosis not present

## 2014-07-23 DIAGNOSIS — I5033 Acute on chronic diastolic (congestive) heart failure: Secondary | ICD-10-CM | POA: Diagnosis not present

## 2014-07-23 DIAGNOSIS — G8929 Other chronic pain: Secondary | ICD-10-CM | POA: Diagnosis not present

## 2014-07-27 DIAGNOSIS — M545 Low back pain: Secondary | ICD-10-CM | POA: Diagnosis not present

## 2014-07-27 DIAGNOSIS — G609 Hereditary and idiopathic neuropathy, unspecified: Secondary | ICD-10-CM | POA: Diagnosis not present

## 2014-07-27 DIAGNOSIS — M6281 Muscle weakness (generalized): Secondary | ICD-10-CM | POA: Diagnosis not present

## 2014-07-27 DIAGNOSIS — I5033 Acute on chronic diastolic (congestive) heart failure: Secondary | ICD-10-CM | POA: Diagnosis not present

## 2014-07-27 DIAGNOSIS — M25512 Pain in left shoulder: Secondary | ICD-10-CM | POA: Diagnosis not present

## 2014-07-27 DIAGNOSIS — G8929 Other chronic pain: Secondary | ICD-10-CM | POA: Diagnosis not present

## 2014-07-29 DIAGNOSIS — M545 Low back pain: Secondary | ICD-10-CM | POA: Diagnosis not present

## 2014-07-29 DIAGNOSIS — M25512 Pain in left shoulder: Secondary | ICD-10-CM | POA: Diagnosis not present

## 2014-07-29 DIAGNOSIS — G8929 Other chronic pain: Secondary | ICD-10-CM | POA: Diagnosis not present

## 2014-07-29 DIAGNOSIS — I5033 Acute on chronic diastolic (congestive) heart failure: Secondary | ICD-10-CM | POA: Diagnosis not present

## 2014-07-29 DIAGNOSIS — G609 Hereditary and idiopathic neuropathy, unspecified: Secondary | ICD-10-CM | POA: Diagnosis not present

## 2014-07-29 DIAGNOSIS — M6281 Muscle weakness (generalized): Secondary | ICD-10-CM | POA: Diagnosis not present

## 2014-07-30 DIAGNOSIS — G609 Hereditary and idiopathic neuropathy, unspecified: Secondary | ICD-10-CM | POA: Diagnosis not present

## 2014-07-30 DIAGNOSIS — I5033 Acute on chronic diastolic (congestive) heart failure: Secondary | ICD-10-CM | POA: Diagnosis not present

## 2014-07-30 DIAGNOSIS — M545 Low back pain: Secondary | ICD-10-CM | POA: Diagnosis not present

## 2014-07-30 DIAGNOSIS — M6281 Muscle weakness (generalized): Secondary | ICD-10-CM | POA: Diagnosis not present

## 2014-07-30 DIAGNOSIS — M25512 Pain in left shoulder: Secondary | ICD-10-CM | POA: Diagnosis not present

## 2014-07-30 DIAGNOSIS — G8929 Other chronic pain: Secondary | ICD-10-CM | POA: Diagnosis not present

## 2014-08-04 DIAGNOSIS — M6281 Muscle weakness (generalized): Secondary | ICD-10-CM | POA: Diagnosis not present

## 2014-08-04 DIAGNOSIS — M25512 Pain in left shoulder: Secondary | ICD-10-CM | POA: Diagnosis not present

## 2014-08-04 DIAGNOSIS — G609 Hereditary and idiopathic neuropathy, unspecified: Secondary | ICD-10-CM | POA: Diagnosis not present

## 2014-08-04 DIAGNOSIS — M545 Low back pain: Secondary | ICD-10-CM | POA: Diagnosis not present

## 2014-08-04 DIAGNOSIS — G8929 Other chronic pain: Secondary | ICD-10-CM | POA: Diagnosis not present

## 2014-08-04 DIAGNOSIS — I5033 Acute on chronic diastolic (congestive) heart failure: Secondary | ICD-10-CM | POA: Diagnosis not present

## 2014-08-06 DIAGNOSIS — G8929 Other chronic pain: Secondary | ICD-10-CM | POA: Diagnosis not present

## 2014-08-06 DIAGNOSIS — G609 Hereditary and idiopathic neuropathy, unspecified: Secondary | ICD-10-CM | POA: Diagnosis not present

## 2014-08-06 DIAGNOSIS — M545 Low back pain: Secondary | ICD-10-CM | POA: Diagnosis not present

## 2014-08-06 DIAGNOSIS — I5033 Acute on chronic diastolic (congestive) heart failure: Secondary | ICD-10-CM | POA: Diagnosis not present

## 2014-08-06 DIAGNOSIS — M25512 Pain in left shoulder: Secondary | ICD-10-CM | POA: Diagnosis not present

## 2014-08-06 DIAGNOSIS — M6281 Muscle weakness (generalized): Secondary | ICD-10-CM | POA: Diagnosis not present

## 2014-08-10 DIAGNOSIS — I5033 Acute on chronic diastolic (congestive) heart failure: Secondary | ICD-10-CM | POA: Diagnosis not present

## 2014-08-10 DIAGNOSIS — M545 Low back pain: Secondary | ICD-10-CM | POA: Diagnosis not present

## 2014-08-10 DIAGNOSIS — M25512 Pain in left shoulder: Secondary | ICD-10-CM | POA: Diagnosis not present

## 2014-08-10 DIAGNOSIS — G8929 Other chronic pain: Secondary | ICD-10-CM | POA: Diagnosis not present

## 2014-08-10 DIAGNOSIS — M6281 Muscle weakness (generalized): Secondary | ICD-10-CM | POA: Diagnosis not present

## 2014-08-10 DIAGNOSIS — G609 Hereditary and idiopathic neuropathy, unspecified: Secondary | ICD-10-CM | POA: Diagnosis not present

## 2014-08-12 DIAGNOSIS — M25512 Pain in left shoulder: Secondary | ICD-10-CM | POA: Diagnosis not present

## 2014-08-12 DIAGNOSIS — G609 Hereditary and idiopathic neuropathy, unspecified: Secondary | ICD-10-CM | POA: Diagnosis not present

## 2014-08-12 DIAGNOSIS — G8929 Other chronic pain: Secondary | ICD-10-CM | POA: Diagnosis not present

## 2014-08-12 DIAGNOSIS — M6281 Muscle weakness (generalized): Secondary | ICD-10-CM | POA: Diagnosis not present

## 2014-08-12 DIAGNOSIS — M545 Low back pain: Secondary | ICD-10-CM | POA: Diagnosis not present

## 2014-08-12 DIAGNOSIS — I5033 Acute on chronic diastolic (congestive) heart failure: Secondary | ICD-10-CM | POA: Diagnosis not present

## 2014-08-14 DIAGNOSIS — G609 Hereditary and idiopathic neuropathy, unspecified: Secondary | ICD-10-CM | POA: Diagnosis not present

## 2014-08-14 DIAGNOSIS — I5033 Acute on chronic diastolic (congestive) heart failure: Secondary | ICD-10-CM | POA: Diagnosis not present

## 2014-08-14 DIAGNOSIS — G8929 Other chronic pain: Secondary | ICD-10-CM | POA: Diagnosis not present

## 2014-08-14 DIAGNOSIS — M545 Low back pain: Secondary | ICD-10-CM | POA: Diagnosis not present

## 2014-08-14 DIAGNOSIS — M6281 Muscle weakness (generalized): Secondary | ICD-10-CM | POA: Diagnosis not present

## 2014-08-14 DIAGNOSIS — M25512 Pain in left shoulder: Secondary | ICD-10-CM | POA: Diagnosis not present

## 2014-08-19 DIAGNOSIS — R41841 Cognitive communication deficit: Secondary | ICD-10-CM | POA: Diagnosis not present

## 2014-08-19 DIAGNOSIS — R488 Other symbolic dysfunctions: Secondary | ICD-10-CM | POA: Diagnosis not present

## 2014-08-19 DIAGNOSIS — R262 Difficulty in walking, not elsewhere classified: Secondary | ICD-10-CM | POA: Diagnosis not present

## 2014-08-19 DIAGNOSIS — M150 Primary generalized (osteo)arthritis: Secondary | ICD-10-CM | POA: Diagnosis not present

## 2014-08-19 DIAGNOSIS — M6281 Muscle weakness (generalized): Secondary | ICD-10-CM | POA: Diagnosis not present

## 2014-08-19 DIAGNOSIS — R279 Unspecified lack of coordination: Secondary | ICD-10-CM | POA: Diagnosis not present

## 2014-08-20 DIAGNOSIS — M6281 Muscle weakness (generalized): Secondary | ICD-10-CM | POA: Diagnosis not present

## 2014-08-20 DIAGNOSIS — R488 Other symbolic dysfunctions: Secondary | ICD-10-CM | POA: Diagnosis not present

## 2014-08-20 DIAGNOSIS — R279 Unspecified lack of coordination: Secondary | ICD-10-CM | POA: Diagnosis not present

## 2014-08-20 DIAGNOSIS — R41841 Cognitive communication deficit: Secondary | ICD-10-CM | POA: Diagnosis not present

## 2014-08-20 DIAGNOSIS — R262 Difficulty in walking, not elsewhere classified: Secondary | ICD-10-CM | POA: Diagnosis not present

## 2014-08-20 DIAGNOSIS — M150 Primary generalized (osteo)arthritis: Secondary | ICD-10-CM | POA: Diagnosis not present

## 2014-08-21 DIAGNOSIS — R262 Difficulty in walking, not elsewhere classified: Secondary | ICD-10-CM | POA: Diagnosis not present

## 2014-08-21 DIAGNOSIS — R488 Other symbolic dysfunctions: Secondary | ICD-10-CM | POA: Diagnosis not present

## 2014-08-21 DIAGNOSIS — R41841 Cognitive communication deficit: Secondary | ICD-10-CM | POA: Diagnosis not present

## 2014-08-21 DIAGNOSIS — R279 Unspecified lack of coordination: Secondary | ICD-10-CM | POA: Diagnosis not present

## 2014-08-21 DIAGNOSIS — M6281 Muscle weakness (generalized): Secondary | ICD-10-CM | POA: Diagnosis not present

## 2014-08-21 DIAGNOSIS — M150 Primary generalized (osteo)arthritis: Secondary | ICD-10-CM | POA: Diagnosis not present

## 2014-08-24 ENCOUNTER — Ambulatory Visit (INDEPENDENT_AMBULATORY_CARE_PROVIDER_SITE_OTHER): Payer: Medicare Other | Admitting: Neurology

## 2014-08-24 ENCOUNTER — Encounter: Payer: Self-pay | Admitting: Neurology

## 2014-08-24 VITALS — BP 114/60 | HR 76 | Resp 16 | Ht 64.0 in | Wt 158.0 lb

## 2014-08-24 DIAGNOSIS — E785 Hyperlipidemia, unspecified: Secondary | ICD-10-CM | POA: Diagnosis not present

## 2014-08-24 DIAGNOSIS — I10 Essential (primary) hypertension: Secondary | ICD-10-CM | POA: Diagnosis not present

## 2014-08-24 DIAGNOSIS — R413 Other amnesia: Secondary | ICD-10-CM

## 2014-08-24 LAB — VITAMIN B12: VITAMIN B 12: 531 pg/mL (ref 211–911)

## 2014-08-24 MED ORDER — DONEPEZIL HCL 10 MG PO TABS: ORAL_TABLET | ORAL | Status: DC

## 2014-08-24 NOTE — Patient Instructions (Addendum)
1. Bloodwork for B12 2. Schedule MRI brain without contrast 3. Start Aricept 10mg : Take 1/2 table daily for 1 week, then increase to 1 tablet daily 4. Physical exercise and brain stimulation exercises are important for brain health 5. Follow-up in 3 months  YOU HAVE BEEN SCHEDULED AT [facility] FOR MRI BRAIN SCAN  WED. 09/02/14 @ 2:00 PM. PLEASE ARRIVE @ 1:30 PM.  [address] [place], [place] [address] [phone]
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
## 2014-08-25 ENCOUNTER — Encounter: Payer: Self-pay | Admitting: Neurology

## 2014-08-25 ENCOUNTER — Telehealth: Payer: Self-pay | Admitting: Family Medicine

## 2014-08-25 DIAGNOSIS — R413 Other amnesia: Secondary | ICD-10-CM | POA: Insufficient documentation

## 2014-08-25 DIAGNOSIS — M150 Primary generalized (osteo)arthritis: Secondary | ICD-10-CM | POA: Diagnosis not present

## 2014-08-25 DIAGNOSIS — E785 Hyperlipidemia, unspecified: Secondary | ICD-10-CM | POA: Insufficient documentation

## 2014-08-25 DIAGNOSIS — R41841 Cognitive communication deficit: Secondary | ICD-10-CM | POA: Diagnosis not present

## 2014-08-25 DIAGNOSIS — M6281 Muscle weakness (generalized): Secondary | ICD-10-CM | POA: Diagnosis not present

## 2014-08-25 DIAGNOSIS — R488 Other symbolic dysfunctions: Secondary | ICD-10-CM | POA: Diagnosis not present

## 2014-08-25 DIAGNOSIS — R279 Unspecified lack of coordination: Secondary | ICD-10-CM | POA: Diagnosis not present

## 2014-08-25 DIAGNOSIS — R262 Difficulty in walking, not elsewhere classified: Secondary | ICD-10-CM | POA: Diagnosis not present

## 2014-08-25 DIAGNOSIS — I10 Essential (primary) hypertension: Secondary | ICD-10-CM | POA: Insufficient documentation

## 2014-08-25 NOTE — Telephone Encounter (Signed)
Left msg for Lance Kemp(son) to return my call.

## 2014-08-26 ENCOUNTER — Encounter: Payer: Self-pay | Admitting: Family Medicine

## 2014-08-26 DIAGNOSIS — M6281 Muscle weakness (generalized): Secondary | ICD-10-CM | POA: Diagnosis not present

## 2014-08-26 DIAGNOSIS — R279 Unspecified lack of coordination: Secondary | ICD-10-CM | POA: Diagnosis not present

## 2014-08-26 DIAGNOSIS — R488 Other symbolic dysfunctions: Secondary | ICD-10-CM | POA: Diagnosis not present

## 2014-08-26 DIAGNOSIS — R41841 Cognitive communication deficit: Secondary | ICD-10-CM | POA: Diagnosis not present

## 2014-08-26 DIAGNOSIS — R262 Difficulty in walking, not elsewhere classified: Secondary | ICD-10-CM | POA: Diagnosis not present

## 2014-08-26 DIAGNOSIS — M150 Primary generalized (osteo)arthritis: Secondary | ICD-10-CM | POA: Diagnosis not present

## 2014-08-27 ENCOUNTER — Ambulatory Visit (INDEPENDENT_AMBULATORY_CARE_PROVIDER_SITE_OTHER): Payer: Medicare Other | Admitting: Family Medicine

## 2014-08-27 ENCOUNTER — Encounter: Payer: Self-pay | Admitting: Family Medicine

## 2014-08-27 VITALS — BP 137/73 | HR 64 | Temp 97.4°F | Wt 158.0 lb

## 2014-08-27 DIAGNOSIS — R262 Difficulty in walking, not elsewhere classified: Secondary | ICD-10-CM | POA: Diagnosis not present

## 2014-08-27 DIAGNOSIS — N3941 Urge incontinence: Secondary | ICD-10-CM

## 2014-08-27 DIAGNOSIS — K5900 Constipation, unspecified: Secondary | ICD-10-CM | POA: Diagnosis not present

## 2014-08-27 DIAGNOSIS — M150 Primary generalized (osteo)arthritis: Secondary | ICD-10-CM | POA: Diagnosis not present

## 2014-08-27 DIAGNOSIS — I872 Venous insufficiency (chronic) (peripheral): Secondary | ICD-10-CM | POA: Diagnosis not present

## 2014-08-27 DIAGNOSIS — R5383 Other fatigue: Secondary | ICD-10-CM | POA: Diagnosis not present

## 2014-08-27 DIAGNOSIS — Z862 Personal history of diseases of the blood and blood-forming organs and certain disorders involving the immune mechanism: Secondary | ICD-10-CM

## 2014-08-27 DIAGNOSIS — R488 Other symbolic dysfunctions: Secondary | ICD-10-CM | POA: Diagnosis not present

## 2014-08-27 DIAGNOSIS — R279 Unspecified lack of coordination: Secondary | ICD-10-CM | POA: Diagnosis not present

## 2014-08-27 DIAGNOSIS — M6281 Muscle weakness (generalized): Secondary | ICD-10-CM | POA: Diagnosis not present

## 2014-08-27 DIAGNOSIS — R41841 Cognitive communication deficit: Secondary | ICD-10-CM | POA: Diagnosis not present

## 2014-08-27 LAB — CBC WITH DIFFERENTIAL/PLATELET
Basophils Absolute: 0.1 10*3/uL (ref 0.0–0.1)
Basophils Relative: 0.7 % (ref 0.0–3.0)
Eosinophils Absolute: 0.7 10*3/uL (ref 0.0–0.7)
Eosinophils Relative: 8.2 % — ABNORMAL HIGH (ref 0.0–5.0)
HCT: 34.2 % — ABNORMAL LOW (ref 39.0–52.0)
Hemoglobin: 11.6 g/dL — ABNORMAL LOW (ref 13.0–17.0)
LYMPHS PCT: 26.6 % (ref 12.0–46.0)
Lymphs Abs: 2.1 10*3/uL (ref 0.7–4.0)
MCHC: 34 g/dL (ref 30.0–36.0)
MCV: 88.2 fl (ref 78.0–100.0)
Monocytes Absolute: 0.9 10*3/uL (ref 0.1–1.0)
Monocytes Relative: 11.5 % (ref 3.0–12.0)
NEUTROS PCT: 53 % (ref 43.0–77.0)
Neutro Abs: 4.3 10*3/uL (ref 1.4–7.7)
PLATELETS: 203 10*3/uL (ref 150.0–400.0)
RBC: 3.87 Mil/uL — AB (ref 4.22–5.81)
RDW: 15.6 % — ABNORMAL HIGH (ref 11.5–15.5)
WBC: 8.1 10*3/uL (ref 4.0–10.5)

## 2014-08-27 LAB — BASIC METABOLIC PANEL
BUN: 17 mg/dL (ref 6–23)
CALCIUM: 8.8 mg/dL (ref 8.4–10.5)
CO2: 29 mEq/L (ref 19–32)
Chloride: 104 mEq/L (ref 96–112)
Creatinine, Ser: 1.07 mg/dL (ref 0.40–1.50)
GFR: 69.08 mL/min (ref >60.00)
GLUCOSE: 98 mg/dL (ref 70–99)
POTASSIUM: 4.4 meq/L (ref 3.5–5.1)
SODIUM: 137 meq/L (ref 135–145)

## 2014-08-27 NOTE — Progress Notes (Signed)
Pre visit review using our clinic review tool, if applicable. No additional management support is needed unless otherwise documented below in the visit note. 

## 2014-08-27 NOTE — Progress Notes (Signed)
OFFICE NOTE  08/27/2014  CC:  Chief Complaint  Patient presents with  . Follow-up   HPI: Patient is a 81 y.o. Caucasian male who is here with his son for f/u HTN, chronic LE venous insufficiency, chronic left shoulder and chronic low back pain.  Apparently his ALF has been still giving him vicodin q6h DESPITE my orders to only give this prn.  Pt complains of feeling sedated/mentally "snowed", generally "washed out" lately. No chest or abd pain, no n/v/d, no melena.  He does seem to have some problems having regular/normal bowel evacuations.   Also says urinary urgency/frequency bothers him still, and he leaks through to his pants a couple of times a day.  No rectal pain or dysuria or hematuria.    He recently saw Dr. Delice Lesch for memory issues, was started on aricept and an MRI brain has been ordered.  BP was 114/60 3 d/a per son. His wt is stable and he feels like his LE swelling is stable.  Pertinent PMH:  Past medical, surgical, social, and family history reviewed and no changes are noted since last office visit.  MEDS:  Outpatient Prescriptions Prior to Visit  Medication Sig Dispense Refill  . aspirin 81 MG chewable tablet Chew 81 mg by mouth daily.    Marland Kitchen atorvastatin (LIPITOR) 20 MG tablet Take 20 mg by mouth every evening.     Marland Kitchen azelastine (OPTIVAR) 0.05 % ophthalmic solution Place 1 drop into the right eye 2 (two) times daily.     . baclofen (LIORESAL) 10 MG tablet Take 10 mg by mouth 3 (three) times daily as needed for muscle spasms.     Marland Kitchen donepezil (ARICEPT) 10 MG tablet Take 1/2 tablet daily for 1 week, then increase to 1 tablet daily 30 tablet 6  . finasteride (PROSCAR) 5 MG tablet Take 1 tablet (5 mg total) by mouth daily. 30 tablet 6  . furosemide (LASIX) 40 MG tablet Take 40 mg by mouth daily.    . hydrALAZINE (APRESOLINE) 10 MG tablet Take 10 mg by mouth 3 (three) times daily.     Marland Kitchen HYDROcodone-acetaminophen (NORCO/VICODIN) 5-325 MG per tablet Take 1 tablet by mouth every  6 (six) hours as needed for moderate pain. 60 tablet 0  . LEVOBUNOLOL HCL OP Apply to eye.    . meclizine (ANTIVERT) 25 MG tablet Take 12.5 mg by mouth 2 (two) times daily as needed for dizziness.    . tamsulosin (FLOMAX) 0.4 MG CAPS capsule Take 2 capsules (0.8 mg total) by mouth daily after supper. 60 capsule 6  . temazepam (RESTORIL) 7.5 MG capsule Take 1 capsule (7.5 mg total) by mouth at bedtime as needed for sleep. 30 capsule 5   No facility-administered medications prior to visit.    PE: Blood pressure 137/73, pulse 64, temperature 97.4 F (36.3 C), temperature source Temporal, weight 158 lb (71.668 kg), SpO2 95 %. Gen: alert but appears tired.  He can focus/attend fine, displays lucid thought and speech. CV: RRR, distant S1 and S2.  No audible murmur/rub/gallop. Chest is clear other than his usual "dry" insp crackles in both bases (chronic, no associated CXR changes in the past), no wheezing.  Normal symmetric air entry throughout both lung fields. No chest wall deformities or tenderness. ABD: soft, NT/ND, BS normal.  No mass or HSM. EXT: 3+ bilat pitting edema from knees down into feet bilat.  He has compression stockings on.  No skin breakdown or weeping of skin of LLs.  LABS:  Lab  Results  Component Value Date   TSH 3.390 02/18/2014     Chemistry      Component Value Date/Time   NA 137 04/16/2014 1529   K 4.8 04/16/2014 1529   CL 103 04/16/2014 1529   CO2 25 04/16/2014 1529   BUN 29* 04/16/2014 1529   CREATININE 1.3 04/16/2014 1529   CREATININE 1.44* 01/16/2014 1658      Component Value Date/Time   CALCIUM 8.8 04/16/2014 1529   ALKPHOS 78 02/19/2014 0909   AST 20 02/19/2014 0909   ALT 17 02/19/2014 0909   BILITOT 0.4 02/19/2014 0909     Lab Results  Component Value Date   WBC 6.8 02/19/2014   HGB 10.3* 02/19/2014   HCT 30.2* 02/19/2014   MCV 90.7 02/19/2014   PLT 239 02/19/2014   IMPRESSION AND PLAN:  1) Fatigue/malaise: he is being overmedicated.  I  will make sure we get in contact with the necessary people at his ALF so that his vicodin is changed to PRN dosing (as I have previously tried to specify on my rx's that I sent to them AND on clarification orders sent subsequent to that).   Will also recheck CBC and BMET.  2) Urinary urgency/frequency: suspect multifactorial--BPH + overactive bladder.  However, we'll check an abdominal plain film to assess stool burden to make sure that his urinary sx's are not just worse due to a full colon pressing against/irritating his bladder.   3) Chronic left shoulder pain--inoperable, trying to treat with prn vicodin. Chronic LBP: pt to get injection by Dr. Nelva Bush soon per pt's son.  4) Chronic LE venous insufficiency: doing ok.  Continue daily lasix 40mg .  An After Visit Summary was printed and given to the patient.  FOLLOW UP: 3 mo

## 2014-08-28 ENCOUNTER — Ambulatory Visit (HOSPITAL_BASED_OUTPATIENT_CLINIC_OR_DEPARTMENT_OTHER)
Admission: RE | Admit: 2014-08-28 | Discharge: 2014-08-28 | Disposition: A | Discharge status: Home / Self Care | Payer: Medicare Other | Source: Ambulatory Visit | Attending: Family Medicine | Admitting: Family Medicine

## 2014-08-28 DIAGNOSIS — R1030 Lower abdominal pain, unspecified: Secondary | ICD-10-CM | POA: Insufficient documentation

## 2014-08-28 DIAGNOSIS — R488 Other symbolic dysfunctions: Secondary | ICD-10-CM | POA: Diagnosis not present

## 2014-08-28 DIAGNOSIS — M6281 Muscle weakness (generalized): Secondary | ICD-10-CM | POA: Diagnosis not present

## 2014-08-28 DIAGNOSIS — R262 Difficulty in walking, not elsewhere classified: Secondary | ICD-10-CM | POA: Diagnosis not present

## 2014-08-28 DIAGNOSIS — R279 Unspecified lack of coordination: Secondary | ICD-10-CM | POA: Diagnosis not present

## 2014-08-28 DIAGNOSIS — R41841 Cognitive communication deficit: Secondary | ICD-10-CM | POA: Diagnosis not present

## 2014-08-28 DIAGNOSIS — N3941 Urge incontinence: Secondary | ICD-10-CM

## 2014-08-28 DIAGNOSIS — K5900 Constipation, unspecified: Secondary | ICD-10-CM | POA: Insufficient documentation

## 2014-08-28 DIAGNOSIS — M150 Primary generalized (osteo)arthritis: Secondary | ICD-10-CM | POA: Diagnosis not present

## 2014-08-29 DIAGNOSIS — M6281 Muscle weakness (generalized): Secondary | ICD-10-CM | POA: Diagnosis not present

## 2014-08-29 DIAGNOSIS — M150 Primary generalized (osteo)arthritis: Secondary | ICD-10-CM | POA: Diagnosis not present

## 2014-08-29 DIAGNOSIS — R279 Unspecified lack of coordination: Secondary | ICD-10-CM | POA: Diagnosis not present

## 2014-08-29 DIAGNOSIS — R262 Difficulty in walking, not elsewhere classified: Secondary | ICD-10-CM | POA: Diagnosis not present

## 2014-08-29 DIAGNOSIS — R41841 Cognitive communication deficit: Secondary | ICD-10-CM | POA: Diagnosis not present

## 2014-08-29 DIAGNOSIS — R488 Other symbolic dysfunctions: Secondary | ICD-10-CM | POA: Diagnosis not present

## 2014-08-31 DIAGNOSIS — R488 Other symbolic dysfunctions: Secondary | ICD-10-CM | POA: Diagnosis not present

## 2014-08-31 DIAGNOSIS — R262 Difficulty in walking, not elsewhere classified: Secondary | ICD-10-CM | POA: Diagnosis not present

## 2014-08-31 DIAGNOSIS — R41841 Cognitive communication deficit: Secondary | ICD-10-CM | POA: Diagnosis not present

## 2014-08-31 DIAGNOSIS — M6281 Muscle weakness (generalized): Secondary | ICD-10-CM | POA: Diagnosis not present

## 2014-08-31 DIAGNOSIS — M150 Primary generalized (osteo)arthritis: Secondary | ICD-10-CM | POA: Diagnosis not present

## 2014-08-31 DIAGNOSIS — R279 Unspecified lack of coordination: Secondary | ICD-10-CM | POA: Diagnosis not present

## 2014-09-01 DIAGNOSIS — R279 Unspecified lack of coordination: Secondary | ICD-10-CM | POA: Diagnosis not present

## 2014-09-01 DIAGNOSIS — R488 Other symbolic dysfunctions: Secondary | ICD-10-CM | POA: Diagnosis not present

## 2014-09-01 DIAGNOSIS — M150 Primary generalized (osteo)arthritis: Secondary | ICD-10-CM | POA: Diagnosis not present

## 2014-09-01 DIAGNOSIS — R41841 Cognitive communication deficit: Secondary | ICD-10-CM | POA: Diagnosis not present

## 2014-09-01 DIAGNOSIS — M6281 Muscle weakness (generalized): Secondary | ICD-10-CM | POA: Diagnosis not present

## 2014-09-01 DIAGNOSIS — R262 Difficulty in walking, not elsewhere classified: Secondary | ICD-10-CM | POA: Diagnosis not present

## 2014-09-02 DIAGNOSIS — R279 Unspecified lack of coordination: Secondary | ICD-10-CM | POA: Diagnosis not present

## 2014-09-02 DIAGNOSIS — R41841 Cognitive communication deficit: Secondary | ICD-10-CM | POA: Diagnosis not present

## 2014-09-02 DIAGNOSIS — R930 Abnormal findings on diagnostic imaging of skull and head, not elsewhere classified: Secondary | ICD-10-CM | POA: Diagnosis not present

## 2014-09-02 DIAGNOSIS — M6281 Muscle weakness (generalized): Secondary | ICD-10-CM | POA: Diagnosis not present

## 2014-09-02 DIAGNOSIS — Z85828 Personal history of other malignant neoplasm of skin: Secondary | ICD-10-CM | POA: Diagnosis not present

## 2014-09-02 DIAGNOSIS — M150 Primary generalized (osteo)arthritis: Secondary | ICD-10-CM | POA: Diagnosis not present

## 2014-09-02 DIAGNOSIS — R488 Other symbolic dysfunctions: Secondary | ICD-10-CM | POA: Diagnosis not present

## 2014-09-02 DIAGNOSIS — R413 Other amnesia: Secondary | ICD-10-CM | POA: Diagnosis not present

## 2014-09-02 DIAGNOSIS — R262 Difficulty in walking, not elsewhere classified: Secondary | ICD-10-CM | POA: Diagnosis not present

## 2014-09-03 DIAGNOSIS — M150 Primary generalized (osteo)arthritis: Secondary | ICD-10-CM | POA: Diagnosis not present

## 2014-09-03 DIAGNOSIS — R262 Difficulty in walking, not elsewhere classified: Secondary | ICD-10-CM | POA: Diagnosis not present

## 2014-09-03 DIAGNOSIS — R488 Other symbolic dysfunctions: Secondary | ICD-10-CM | POA: Diagnosis not present

## 2014-09-03 DIAGNOSIS — R279 Unspecified lack of coordination: Secondary | ICD-10-CM | POA: Diagnosis not present

## 2014-09-03 DIAGNOSIS — R41841 Cognitive communication deficit: Secondary | ICD-10-CM | POA: Diagnosis not present

## 2014-09-03 DIAGNOSIS — M6281 Muscle weakness (generalized): Secondary | ICD-10-CM | POA: Diagnosis not present

## 2014-09-04 DIAGNOSIS — R41841 Cognitive communication deficit: Secondary | ICD-10-CM | POA: Diagnosis not present

## 2014-09-04 DIAGNOSIS — M6281 Muscle weakness (generalized): Secondary | ICD-10-CM | POA: Diagnosis not present

## 2014-09-04 DIAGNOSIS — R262 Difficulty in walking, not elsewhere classified: Secondary | ICD-10-CM | POA: Diagnosis not present

## 2014-09-04 DIAGNOSIS — R488 Other symbolic dysfunctions: Secondary | ICD-10-CM | POA: Diagnosis not present

## 2014-09-04 DIAGNOSIS — M150 Primary generalized (osteo)arthritis: Secondary | ICD-10-CM | POA: Diagnosis not present

## 2014-09-04 DIAGNOSIS — R279 Unspecified lack of coordination: Secondary | ICD-10-CM | POA: Diagnosis not present

## 2014-09-07 DIAGNOSIS — M6281 Muscle weakness (generalized): Secondary | ICD-10-CM | POA: Diagnosis not present

## 2014-09-07 DIAGNOSIS — R488 Other symbolic dysfunctions: Secondary | ICD-10-CM | POA: Diagnosis not present

## 2014-09-07 DIAGNOSIS — R279 Unspecified lack of coordination: Secondary | ICD-10-CM | POA: Diagnosis not present

## 2014-09-07 DIAGNOSIS — R262 Difficulty in walking, not elsewhere classified: Secondary | ICD-10-CM | POA: Diagnosis not present

## 2014-09-07 DIAGNOSIS — R41841 Cognitive communication deficit: Secondary | ICD-10-CM | POA: Diagnosis not present

## 2014-09-07 DIAGNOSIS — M150 Primary generalized (osteo)arthritis: Secondary | ICD-10-CM | POA: Diagnosis not present

## 2014-09-08 DIAGNOSIS — R279 Unspecified lack of coordination: Secondary | ICD-10-CM | POA: Diagnosis not present

## 2014-09-08 DIAGNOSIS — M150 Primary generalized (osteo)arthritis: Secondary | ICD-10-CM | POA: Diagnosis not present

## 2014-09-08 DIAGNOSIS — R262 Difficulty in walking, not elsewhere classified: Secondary | ICD-10-CM | POA: Diagnosis not present

## 2014-09-08 DIAGNOSIS — R41841 Cognitive communication deficit: Secondary | ICD-10-CM | POA: Diagnosis not present

## 2014-09-08 DIAGNOSIS — R488 Other symbolic dysfunctions: Secondary | ICD-10-CM | POA: Diagnosis not present

## 2014-09-08 DIAGNOSIS — M6281 Muscle weakness (generalized): Secondary | ICD-10-CM | POA: Diagnosis not present

## 2014-09-09 DIAGNOSIS — R262 Difficulty in walking, not elsewhere classified: Secondary | ICD-10-CM | POA: Diagnosis not present

## 2014-09-09 DIAGNOSIS — R488 Other symbolic dysfunctions: Secondary | ICD-10-CM | POA: Diagnosis not present

## 2014-09-09 DIAGNOSIS — M150 Primary generalized (osteo)arthritis: Secondary | ICD-10-CM | POA: Diagnosis not present

## 2014-09-09 DIAGNOSIS — R279 Unspecified lack of coordination: Secondary | ICD-10-CM | POA: Diagnosis not present

## 2014-09-09 DIAGNOSIS — R41841 Cognitive communication deficit: Secondary | ICD-10-CM | POA: Diagnosis not present

## 2014-09-09 DIAGNOSIS — M6281 Muscle weakness (generalized): Secondary | ICD-10-CM | POA: Diagnosis not present

## 2014-09-10 ENCOUNTER — Ambulatory Visit: Payer: Medicare Other | Admitting: Family Medicine

## 2014-09-10 ENCOUNTER — Encounter (HOSPITAL_COMMUNITY): Payer: Self-pay | Admitting: Emergency Medicine

## 2014-09-10 ENCOUNTER — Emergency Department (HOSPITAL_COMMUNITY)
Admission: EM | Admit: 2014-09-10 | Discharge: 2014-09-10 | Disposition: A | Discharge status: Home / Self Care | Payer: Medicare Other | Attending: Emergency Medicine | Admitting: Emergency Medicine

## 2014-09-10 ENCOUNTER — Emergency Department (HOSPITAL_COMMUNITY): Payer: Medicare Other

## 2014-09-10 DIAGNOSIS — Y9289 Other specified places as the place of occurrence of the external cause: Secondary | ICD-10-CM | POA: Diagnosis not present

## 2014-09-10 DIAGNOSIS — Z880 Allergy status to penicillin: Secondary | ICD-10-CM | POA: Diagnosis not present

## 2014-09-10 DIAGNOSIS — S51011A Laceration without foreign body of right elbow, initial encounter: Secondary | ICD-10-CM

## 2014-09-10 DIAGNOSIS — M1990 Unspecified osteoarthritis, unspecified site: Secondary | ICD-10-CM | POA: Insufficient documentation

## 2014-09-10 DIAGNOSIS — F329 Major depressive disorder, single episode, unspecified: Secondary | ICD-10-CM | POA: Insufficient documentation

## 2014-09-10 DIAGNOSIS — Z862 Personal history of diseases of the blood and blood-forming organs and certain disorders involving the immune mechanism: Secondary | ICD-10-CM | POA: Insufficient documentation

## 2014-09-10 DIAGNOSIS — I5032 Chronic diastolic (congestive) heart failure: Secondary | ICD-10-CM | POA: Insufficient documentation

## 2014-09-10 DIAGNOSIS — Z7982 Long term (current) use of aspirin: Secondary | ICD-10-CM | POA: Insufficient documentation

## 2014-09-10 DIAGNOSIS — G629 Polyneuropathy, unspecified: Secondary | ICD-10-CM | POA: Diagnosis not present

## 2014-09-10 DIAGNOSIS — Z79899 Other long term (current) drug therapy: Secondary | ICD-10-CM | POA: Insufficient documentation

## 2014-09-10 DIAGNOSIS — I252 Old myocardial infarction: Secondary | ICD-10-CM | POA: Diagnosis not present

## 2014-09-10 DIAGNOSIS — S0990XA Unspecified injury of head, initial encounter: Secondary | ICD-10-CM | POA: Diagnosis not present

## 2014-09-10 DIAGNOSIS — S4992XA Unspecified injury of left shoulder and upper arm, initial encounter: Secondary | ICD-10-CM | POA: Diagnosis not present

## 2014-09-10 DIAGNOSIS — F0390 Unspecified dementia without behavioral disturbance: Secondary | ICD-10-CM | POA: Insufficient documentation

## 2014-09-10 DIAGNOSIS — I10 Essential (primary) hypertension: Secondary | ICD-10-CM | POA: Diagnosis not present

## 2014-09-10 DIAGNOSIS — R531 Weakness: Secondary | ICD-10-CM

## 2014-09-10 DIAGNOSIS — Z87891 Personal history of nicotine dependence: Secondary | ICD-10-CM | POA: Diagnosis not present

## 2014-09-10 DIAGNOSIS — Y9389 Activity, other specified: Secondary | ICD-10-CM | POA: Insufficient documentation

## 2014-09-10 DIAGNOSIS — R404 Transient alteration of awareness: Secondary | ICD-10-CM | POA: Diagnosis not present

## 2014-09-10 DIAGNOSIS — N183 Chronic kidney disease, stage 3 (moderate): Secondary | ICD-10-CM | POA: Insufficient documentation

## 2014-09-10 DIAGNOSIS — I129 Hypertensive chronic kidney disease with stage 1 through stage 4 chronic kidney disease, or unspecified chronic kidney disease: Secondary | ICD-10-CM | POA: Diagnosis not present

## 2014-09-10 DIAGNOSIS — Z8673 Personal history of transient ischemic attack (TIA), and cerebral infarction without residual deficits: Secondary | ICD-10-CM | POA: Insufficient documentation

## 2014-09-10 DIAGNOSIS — M25512 Pain in left shoulder: Secondary | ICD-10-CM | POA: Diagnosis not present

## 2014-09-10 DIAGNOSIS — R079 Chest pain, unspecified: Secondary | ICD-10-CM | POA: Diagnosis not present

## 2014-09-10 DIAGNOSIS — Y998 Other external cause status: Secondary | ICD-10-CM | POA: Diagnosis not present

## 2014-09-10 DIAGNOSIS — Z85828 Personal history of other malignant neoplasm of skin: Secondary | ICD-10-CM | POA: Insufficient documentation

## 2014-09-10 DIAGNOSIS — G8929 Other chronic pain: Secondary | ICD-10-CM | POA: Diagnosis not present

## 2014-09-10 DIAGNOSIS — S51001A Unspecified open wound of right elbow, initial encounter: Secondary | ICD-10-CM | POA: Diagnosis not present

## 2014-09-10 DIAGNOSIS — H409 Unspecified glaucoma: Secondary | ICD-10-CM | POA: Diagnosis not present

## 2014-09-10 DIAGNOSIS — W1839XA Other fall on same level, initial encounter: Secondary | ICD-10-CM | POA: Diagnosis not present

## 2014-09-10 DIAGNOSIS — W19XXXA Unspecified fall, initial encounter: Secondary | ICD-10-CM

## 2014-09-10 LAB — CBC
HCT: 37.7 % — ABNORMAL LOW (ref 39.0–52.0)
Hemoglobin: 12.5 g/dL — ABNORMAL LOW (ref 13.0–17.0)
MCH: 29.3 pg (ref 26.0–34.0)
MCHC: 33.2 g/dL (ref 30.0–36.0)
MCV: 88.3 fL (ref 78.0–100.0)
Platelets: 245 10*3/uL (ref 150–400)
RBC: 4.27 MIL/uL (ref 4.22–5.81)
RDW: 14.8 % (ref 11.5–15.5)
WBC: 8.2 10*3/uL (ref 4.0–10.5)

## 2014-09-10 LAB — URINALYSIS, ROUTINE W REFLEX MICROSCOPIC
BILIRUBIN URINE: NEGATIVE
GLUCOSE, UA: NEGATIVE mg/dL
Hgb urine dipstick: NEGATIVE
KETONES UR: NEGATIVE mg/dL
Leukocytes, UA: NEGATIVE
NITRITE: NEGATIVE
Protein, ur: NEGATIVE mg/dL
Specific Gravity, Urine: 1.007 (ref 1.005–1.030)
UROBILINOGEN UA: 0.2 mg/dL (ref 0.0–1.0)
pH: 8 (ref 5.0–8.0)

## 2014-09-10 LAB — BASIC METABOLIC PANEL
Anion gap: 11 (ref 5–15)
BUN: 13 mg/dL (ref 6–23)
CALCIUM: 9.1 mg/dL (ref 8.4–10.5)
CO2: 24 mmol/L (ref 19–32)
Chloride: 104 mmol/L (ref 96–112)
Creatinine, Ser: 0.94 mg/dL (ref 0.50–1.35)
GFR, EST AFRICAN AMERICAN: 83 mL/min — AB (ref >90)
GFR, EST NON AFRICAN AMERICAN: 72 mL/min — AB (ref >90)
GLUCOSE: 97 mg/dL (ref 70–99)
Potassium: 3.9 mmol/L (ref 3.5–5.1)
SODIUM: 139 mmol/L (ref 135–145)

## 2014-09-10 LAB — CBG MONITORING, ED: Glucose-Capillary: 95 mg/dL (ref 70–99)

## 2014-09-10 LAB — I-STAT TROPONIN, ED: Troponin i, poc: 0.01 ng/mL (ref 0.00–0.08)

## 2014-09-10 MED ORDER — SODIUM CHLORIDE 0.9 % IV BOLUS (SEPSIS)
1000.0000 mL | Freq: Once | INTRAVENOUS | Status: AC
  Administered 2014-09-10: 1000 mL via INTRAVENOUS

## 2014-09-10 NOTE — ED Notes (Signed)
Per EMS pt fall last night resulting in right skin tear to elbow. Pt complaint of worsening weakness, headache, left sided chest pain, left shoulder pain since fall.

## 2014-09-10 NOTE — ED Notes (Signed)
Bed: WA02 Expected date:  Expected time:  Means of arrival:  Comments: EMS WEAKNESS

## 2014-09-10 NOTE — ED Provider Notes (Addendum)
CSN: [id number]     Arrival date & time 09/10/14  9242 History   First MD Initiated Contact with Patient 09/10/14 0940     Chief Complaint  Patient presents with  . Weakness  . Fall     HPI Patient is brought to the emergency department from his facility after a fall last night resulting in skin tear to his right elbow.  Patient reports worsening weakness over the past several days.  He does report mild headache at this time but he is not on anticoagulants.  He reports he has a long-standing history of recurrent headaches.  The chart supports this as well.patient ports she slipped and fell onto his right elbow.  He states no head injury.  He also reports some ongoing pain in his left shoulder although he reports that he has left shoulder pain chronically as well.  He denies neck pain.  He denies weakness of his arms or legs.he does report that his left shoulder hurts slightly more since the fall.   Past Medical History  Diagnosis Date  . Osteoarthritis     hands/wrists  . Depression     antidepressant x 1 yr--situational. Resolved 08/2013 per pt/son.  . Glaucoma   . Hypertension   . 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  No significant residual deficit except short term memory impairment  . Urine incontinence   . Frequent headaches   . Chronic venous insufficiency     compression hose  . Peripheral neuropathy 2013    Dx'd by neuro in [place] at the time the patient was hospitalized briefly for [place] CVA.  . NSTEMI (non-ST elevated myocardial infarction) 06/2011    [place]: Troponin mildly elevated, normal EKG, normal ECHO, pt declined cardiology consult--per old PCP records.  . Basal cell carcinoma 2011    Face  . Chronic renal insufficiency, stage III (moderate) 2013    CrCl in the 50s  . Chronic diastolic heart failure   . Chronic left shoulder pain 09/14/2013  . Normocytic anemia 2015    secondary to CRI (iron studies ok 12/2013)  . Shortness of breath   . Chronic  back pain   . Dementia 2016    Dr. Delice Lesch 08/2014--started aricept   Past Surgical History  Procedure Laterality Date  . Cholecystectomy    . Tonsilectomy, adenoidectomy, bilateral myringotomy and tubes    . Transthoracic echocardiogram  12/18/13    Grade 1 diast dysf, o/w normal   Family History  Problem Relation Age of Onset  . Cancer Mother   . Heart disease Mother    History  Substance Use Topics  . Smoking status: Former Smoker    Types: Pipe  . Smokeless tobacco: Never Used  . Alcohol Use: No    Review of Systems  All other systems reviewed and are negative.     Allergies  Altace; Amoxicillin; and Cozaar  Home Medications   Prior to Admission medications   Medication Sig Start Date End Date Taking? Authorizing Provider  aspirin 81 MG chewable tablet Chew 81 mg by mouth daily.    Historical Provider, MD  atorvastatin (LIPITOR) 20 MG tablet Take 20 mg by mouth every evening.  10/28/13   Historical Provider, MD  azelastine (OPTIVAR) 0.05 % ophthalmic solution Place 1 drop into the right eye 2 (two) times daily.  07/22/13   Historical Provider, MD  baclofen (LIORESAL) 10 MG tablet Take 10 mg by mouth 3 (three) times daily as needed for muscle spasms.  Historical Provider, MD  donepezil (ARICEPT) 10 MG tablet Take 1/2 tablet daily for 1 week, then increase to 1 tablet daily 08/24/14   Cameron Sprang, MD  finasteride (PROSCAR) 5 MG tablet Take 1 tablet (5 mg total) by mouth daily. 01/28/14   [name], MD  furosemide (LASIX) 40 MG tablet Take 40 mg by mouth daily.    Historical Provider, MD  hydrALAZINE (APRESOLINE) 10 MG tablet Take 10 mg by mouth 3 (three) times daily.  08/12/13   Historical Provider, MD  HYDROcodone-acetaminophen (NORCO/VICODIN) 5-325 MG per tablet Take 1 tablet by mouth every 6 (six) hours as needed for moderate pain. 07/02/14   [name], MD  LEVOBUNOLOL HCL OP Apply to eye.    Historical Provider, MD  meclizine (ANTIVERT) 25 MG tablet Take  12.5 mg by mouth 2 (two) times daily as needed for dizziness.    Historical Provider, MD  tamsulosin (FLOMAX) 0.4 MG CAPS capsule Take 2 capsules (0.8 mg total) by mouth daily after supper. 01/02/14   [name], MD  temazepam (RESTORIL) 7.5 MG capsule Take 1 capsule (7.5 mg total) by mouth at bedtime as needed for sleep. 01/14/14   [name], MD   BP 166/72 mmHg  Pulse 69  Temp(Src) 97.9 F (36.6 C) (Oral)  Resp 18  SpO2 99% Physical Exam  Constitutional: He is oriented to person, place, and time. He appears well-developed and well-nourished.  HENT:  Head: Normocephalic and atraumatic.  No signs of trauma to the head.  No hematoma.  Eyes: EOM are normal.  Neck: Normal range of motion. Neck supple.  C-spine nontender.  Full range of motion.  Normal extension and flexion at the neck without pain.  Cardiovascular: Normal rate, regular rhythm, normal heart sounds and intact distal pulses.   Pulmonary/Chest: Effort normal and breath sounds normal. No respiratory distress. He exhibits no tenderness.  Abdominal: Soft. He exhibits no distension. There is no tenderness.  Musculoskeletal: Normal range of motion.  Small skin tear to right elbow however the patient has full range of motion of his right elbow without discomfort or pain.  Normal right radial pulse.  No signs of swelling to the right elbow.  Patient with mild pain with range of motion of his left shoulder mild tenderness of the left before meals joint without obvious deformity or swelling.  Normal left radial pulse.  Neurological: He is alert and oriented to person, place, and time.  Skin: Skin is warm and dry.  Psychiatric: He has a normal mood and affect. Judgment normal.  Nursing note and vitals reviewed.   ED Course  Procedures (including critical care time) Labs Review Labs Reviewed  CBC - Abnormal; Notable for the following:    Hemoglobin 12.5 (*)    HCT 37.7 (*)    All other components within normal limits   BASIC METABOLIC PANEL - Abnormal; Notable for the following:    GFR calc non Af Amer 72 (*)    GFR calc Af Amer 83 (*)    All other components within normal limits  URINALYSIS, ROUTINE W REFLEX MICROSCOPIC  CBG MONITORING, ED  Randolm Idol, ED    Imaging Review Dg Chest 2 View  09/10/2014   CLINICAL DATA:  81 year old male who fell last night with weakness and chest pain radiating to the left. Initial encounter.  EXAM: CHEST  2 VIEW  COMPARISON:  06/26/2014 and earlier.  FINDINGS: Semi upright AP and lateral views of the chest. Chronic  appearing left lateral second rib fracture. Osteopenia. Stable lung volumes. Stable cardiac size and mediastinal contours. No pneumothorax or pulmonary edema. No pleural effusion or consolidation. Calcified atherosclerosis of the aorta. Stable cholecystectomy clips. Left clavicle appears intact.  IMPRESSION: No acute cardiopulmonary abnormality or acute traumatic injury identified.   Electronically Signed   By: Genevie Ann M.D.   On: 09/10/2014 10:47   Dg Shoulder Left  09/10/2014   CLINICAL DATA:  81 year old male who fell last night with increased left shoulder pain. Initial encounter.  EXAM: LEFT SHOULDER - 2+ VIEW  COMPARISON:  Left shoulder MRI 09/17/2013.  FINDINGS: Chronic superior migration of the left humeral head. No glenohumeral joint dislocation. Proximal left humerus intact. Left clavicle and scapula appear intact. No acute findings identified in the visible left ribs are lung parenchyma.  IMPRESSION: Chronic degenerative changes. No acute fracture or dislocation identified about the left shoulder.   Electronically Signed   By: Genevie Ann M.D.   On: 09/10/2014 10:46  I personally reviewed the imaging tests through PACS system I reviewed available ER/hospitalization records through the EMR    EKG Interpretation   Date/Time:  Thursday September 10 2014 09:34:52 EDT Ventricular Rate:  56 PR Interval:  130 QRS Duration: 97 QT Interval:  484 QTC  Calculation: 467 R Axis:   -26 Text Interpretation:  Sinus rhythm Abnormal R-wave progression, early  transition Left ventricular hypertrophy Borderline T abnormalities,  inferior leads Baseline wander in lead(s) V3 No significant change was  found Confirmed by Isahia Hollerbach  MD, Ronnell Makarewicz ([id number]) on 09/10/2014 11:30:06 AM      MDM   Final diagnoses:  None    Labs and urine are without significant abnormality.  Patient reports no headache at this time.  No indication for CT imaging of his head and neck.  C-spine is nontender.  Normal strength of his upper and lower extremities.  Full range of motion of his right elbow.  I suspect this is just a skin tear.  Doubt fracture.  Left shoulder demonstrates chronic arthritic changes.  I recommended close primary care follow-up and return to the ER for any new or worsening symptoms.  Vital signs are normal.    Jola Schmidt, MD 09/10/14 [place], MD 09/10/14 1130

## 2014-09-11 DIAGNOSIS — R262 Difficulty in walking, not elsewhere classified: Secondary | ICD-10-CM | POA: Diagnosis not present

## 2014-09-11 DIAGNOSIS — M150 Primary generalized (osteo)arthritis: Secondary | ICD-10-CM | POA: Diagnosis not present

## 2014-09-11 DIAGNOSIS — R279 Unspecified lack of coordination: Secondary | ICD-10-CM | POA: Diagnosis not present

## 2014-09-11 DIAGNOSIS — R488 Other symbolic dysfunctions: Secondary | ICD-10-CM | POA: Diagnosis not present

## 2014-09-11 DIAGNOSIS — R41841 Cognitive communication deficit: Secondary | ICD-10-CM | POA: Diagnosis not present

## 2014-09-11 DIAGNOSIS — M6281 Muscle weakness (generalized): Secondary | ICD-10-CM | POA: Diagnosis not present

## 2014-09-14 DIAGNOSIS — M150 Primary generalized (osteo)arthritis: Secondary | ICD-10-CM | POA: Diagnosis not present

## 2014-09-14 DIAGNOSIS — R262 Difficulty in walking, not elsewhere classified: Secondary | ICD-10-CM | POA: Diagnosis not present

## 2014-09-14 DIAGNOSIS — M6281 Muscle weakness (generalized): Secondary | ICD-10-CM | POA: Diagnosis not present

## 2014-09-14 DIAGNOSIS — R279 Unspecified lack of coordination: Secondary | ICD-10-CM | POA: Diagnosis not present

## 2014-09-14 DIAGNOSIS — R41841 Cognitive communication deficit: Secondary | ICD-10-CM | POA: Diagnosis not present

## 2014-09-14 DIAGNOSIS — R488 Other symbolic dysfunctions: Secondary | ICD-10-CM | POA: Diagnosis not present

## 2014-09-15 ENCOUNTER — Telehealth: Payer: Self-pay | Admitting: Family Medicine

## 2014-09-15 DIAGNOSIS — M6281 Muscle weakness (generalized): Secondary | ICD-10-CM | POA: Diagnosis not present

## 2014-09-15 DIAGNOSIS — R279 Unspecified lack of coordination: Secondary | ICD-10-CM | POA: Diagnosis not present

## 2014-09-15 DIAGNOSIS — R41841 Cognitive communication deficit: Secondary | ICD-10-CM | POA: Diagnosis not present

## 2014-09-15 DIAGNOSIS — M150 Primary generalized (osteo)arthritis: Secondary | ICD-10-CM | POA: Diagnosis not present

## 2014-09-15 DIAGNOSIS — R488 Other symbolic dysfunctions: Secondary | ICD-10-CM | POA: Diagnosis not present

## 2014-09-15 DIAGNOSIS — R262 Difficulty in walking, not elsewhere classified: Secondary | ICD-10-CM | POA: Diagnosis not present

## 2014-09-15 NOTE — Telephone Encounter (Signed)
-----   Message from Cameron Sprang, MD sent at 09/15/2014 12:19 PM EDT ----- Regarding: MRI Pls let patient/son know that we received report of MRI brain, I reviewed study, no evidence of tumor, stroke, or bleed. There are age-related changes seen. Thanks

## 2014-09-15 NOTE — Telephone Encounter (Signed)
Patient's son/Chris was notified of results.

## 2014-09-16 ENCOUNTER — Encounter: Payer: Self-pay | Admitting: Family Medicine

## 2014-09-16 ENCOUNTER — Ambulatory Visit (INDEPENDENT_AMBULATORY_CARE_PROVIDER_SITE_OTHER): Payer: Medicare Other | Admitting: Family Medicine

## 2014-09-16 VITALS — BP 150/71 | HR 71 | Temp 98.3°F | Ht 65.0 in | Wt 152.5 lb

## 2014-09-16 DIAGNOSIS — K625 Hemorrhage of anus and rectum: Secondary | ICD-10-CM | POA: Diagnosis not present

## 2014-09-16 DIAGNOSIS — R41841 Cognitive communication deficit: Secondary | ICD-10-CM | POA: Diagnosis not present

## 2014-09-16 DIAGNOSIS — M6281 Muscle weakness (generalized): Secondary | ICD-10-CM | POA: Diagnosis not present

## 2014-09-16 DIAGNOSIS — R350 Frequency of micturition: Secondary | ICD-10-CM | POA: Diagnosis not present

## 2014-09-16 DIAGNOSIS — T887XXA Unspecified adverse effect of drug or medicament, initial encounter: Secondary | ICD-10-CM

## 2014-09-16 DIAGNOSIS — R310 Gross hematuria: Secondary | ICD-10-CM

## 2014-09-16 DIAGNOSIS — R262 Difficulty in walking, not elsewhere classified: Secondary | ICD-10-CM | POA: Diagnosis not present

## 2014-09-16 DIAGNOSIS — N183 Chronic kidney disease, stage 3 unspecified: Secondary | ICD-10-CM

## 2014-09-16 DIAGNOSIS — R488 Other symbolic dysfunctions: Secondary | ICD-10-CM | POA: Diagnosis not present

## 2014-09-16 DIAGNOSIS — T50905A Adverse effect of unspecified drugs, medicaments and biological substances, initial encounter: Secondary | ICD-10-CM

## 2014-09-16 DIAGNOSIS — R195 Other fecal abnormalities: Secondary | ICD-10-CM

## 2014-09-16 DIAGNOSIS — N410 Acute prostatitis: Secondary | ICD-10-CM

## 2014-09-16 DIAGNOSIS — M150 Primary generalized (osteo)arthritis: Secondary | ICD-10-CM | POA: Diagnosis not present

## 2014-09-16 DIAGNOSIS — R279 Unspecified lack of coordination: Secondary | ICD-10-CM | POA: Diagnosis not present

## 2014-09-16 LAB — POCT URINALYSIS DIPSTICK
Bilirubin, UA: NEGATIVE
Blood, UA: NEGATIVE
Glucose, UA: NEGATIVE
Ketones, UA: NEGATIVE
Leukocytes, UA: NEGATIVE
Nitrite, UA: NEGATIVE
PH UA: 5.5
PROTEIN UA: NEGATIVE
SPEC GRAV UA: 1.015
Urobilinogen, UA: 0.2

## 2014-09-16 MED ORDER — SULFAMETHOXAZOLE-TRIMETHOPRIM 800-160 MG PO TABS: 1.0000 | ORAL_TABLET | Freq: Two times a day (BID) | ORAL | Status: DC

## 2014-09-16 NOTE — Progress Notes (Signed)
Pre visit review using our clinic review tool, if applicable. No additional management support is needed unless otherwise documented below in the visit note. 

## 2014-09-16 NOTE — Progress Notes (Signed)
OFFICE NOTE  09/16/2014  CC:  Chief Complaint  Patient presents with  . Diarrhea   HPI: Patient is a 81 y.o. Caucasian male who is here for increase in his baseline urinary urgency and frequency and incontinence the last two weeks at least.  Saw blood in urine one time about 2 wks ago.  No dysuria.  No fevers.  This info was gathered from a patient who is exceedingly difficult to get a clear answer from, and no help could be provided from his son who accompanied him today. It is unclear whether he has had diarrhea/loose stool lately vs he has had more constipation lately with perhaps some anal seepage of waterh stool.   He says he has leakage of some loose stool often lately when he urinates.  Some mild crampiness (roving) in abdomen lately but nothing severe per pt.  No n/vd. The stooling complaints came coincidental with starting aricept (neurologist).   Still chronically fatigued and having lots of shoulder and back pain.  His son does seem to think that they have finally stopped giving his vicodin q6 scheduled, and he is now getting it on prn basis.  Unclear whether or not this is making a difference in the excessive daytime somnolence he has been having.    Son gave me a typed letter at the end of the visit that essentially stated that Mr. Brink is acting depressed lately, but we did not get to address this at all today.  Pertinent PMH:  Past medical, surgical, social, and family history reviewed and no changes are noted since last office visit.  MEDS: Not taking tramadol listed below Outpatient Prescriptions Prior to Visit  Medication Sig Dispense Refill  . aspirin 81 MG chewable tablet Chew 81 mg by mouth daily.    Marland Kitchen atorvastatin (LIPITOR) 20 MG tablet Take 20 mg by mouth every evening.     Marland Kitchen azelastine (OPTIVAR) 0.05 % ophthalmic solution Place 1 drop into the right eye 2 (two) times daily.     . baclofen (LIORESAL) 10 MG tablet Take 10 mg by mouth 3 (three) times daily as needed  for muscle spasms.     . betaxolol (BETOPTIC-S) 0.25 % ophthalmic suspension Place 1 drop into the right eye daily.    Marland Kitchen donepezil (ARICEPT) 10 MG tablet Take 1/2 tablet daily for 1 week, then increase to 1 tablet daily (Patient taking differently: Take 10 mg by mouth at bedtime. ) 30 tablet 6  . finasteride (PROSCAR) 5 MG tablet Take 1 tablet (5 mg total) by mouth daily. 30 tablet 6  . furosemide (LASIX) 40 MG tablet Take 40 mg by mouth daily.    . hydrALAZINE (APRESOLINE) 10 MG tablet Take 10 mg by mouth 3 (three) times daily.     Marland Kitchen HYDROcodone-acetaminophen (NORCO/VICODIN) 5-325 MG per tablet Take 1 tablet by mouth every 6 (six) hours as needed for moderate pain. 60 tablet 0  . levobunolol (BETAGAN) 0.5 % ophthalmic solution Place 1 drop into both eyes 2 (two) times daily.    . Menthol, Topical Analgesic, 4 % GEL Apply 1 application topically 3 (three) times daily. Applies to back    . tamsulosin (FLOMAX) 0.4 MG CAPS capsule Take 2 capsules (0.8 mg total) by mouth daily after supper. 60 capsule 6  . temazepam (RESTORIL) 7.5 MG capsule Take 1 capsule (7.5 mg total) by mouth at bedtime as needed for sleep. 30 capsule 5  . traMADol (ULTRAM) 50 MG tablet Take 25 mg by mouth  every 12 (twelve) hours as needed for moderate pain.     No facility-administered medications prior to visit.    PE: Blood pressure 150/71, pulse 71, temperature 98.3 F (36.8 C), temperature source Temporal, height 5\' 5"  (1.651 m), weight 152 lb 8 oz (69.174 kg), SpO2 96 %.Wt is down 5 lbs since last visit 3 wks ago. Gen: alert, mildly tired appearing but not as bad as previous exams.  Oriented x 4. He smiles, interacts well. Heart is regular, no tachycardia  Lungs clear, breathing nonlabored. Rectal: normal rectal tone, DRE seemed painful for him, prostate was diffusely enlarged and firm but not tender.  He had significant urinary urgency with palpation of prostate. No rectal mass.  Stool wipings were hemoccult negative.   No significant amount of stool palbpable in rectal vault.  LAB: CC UA today was normal  IMPRESSION AND PLAN:  1) Urge incontinence, chronic.  Possibly with superimposed acute prostatitis.   Start bactrim DS 1 bid x 10d.  Send urine for c/s for completeness. If not improved with this then he needs to see urologist.  2) Bowel habits unclear; best I could tell he is having more frequent loose stools since starting aricept. Decided to d/c this med for 2 wk trial and see how this problem goes.  3) CRI; check BMET today.  An After Visit Summary was printed and given to the patient.  FOLLOW UP: 2 wks

## 2014-09-16 NOTE — Patient Instructions (Signed)
Stop aricept (donepezil)

## 2014-09-17 DIAGNOSIS — R41841 Cognitive communication deficit: Secondary | ICD-10-CM | POA: Diagnosis not present

## 2014-09-17 DIAGNOSIS — R488 Other symbolic dysfunctions: Secondary | ICD-10-CM | POA: Diagnosis not present

## 2014-09-17 DIAGNOSIS — R279 Unspecified lack of coordination: Secondary | ICD-10-CM | POA: Diagnosis not present

## 2014-09-17 DIAGNOSIS — M150 Primary generalized (osteo)arthritis: Secondary | ICD-10-CM | POA: Diagnosis not present

## 2014-09-17 DIAGNOSIS — M6281 Muscle weakness (generalized): Secondary | ICD-10-CM | POA: Diagnosis not present

## 2014-09-17 DIAGNOSIS — R262 Difficulty in walking, not elsewhere classified: Secondary | ICD-10-CM | POA: Diagnosis not present

## 2014-09-17 LAB — CBC WITH DIFFERENTIAL/PLATELET
Basophils Absolute: 0.1 10*3/uL (ref 0.0–0.1)
Basophils Relative: 0.6 % (ref 0.0–3.0)
Eosinophils Absolute: 0.6 10*3/uL (ref 0.0–0.7)
Eosinophils Relative: 5.7 % — ABNORMAL HIGH (ref 0.0–5.0)
HCT: 35.5 % — ABNORMAL LOW (ref 39.0–52.0)
Hemoglobin: 12 g/dL — ABNORMAL LOW (ref 13.0–17.0)
Lymphocytes Relative: 24 % (ref 12.0–46.0)
Lymphs Abs: 2.3 10*3/uL (ref 0.7–4.0)
MCHC: 33.7 g/dL (ref 30.0–36.0)
MCV: 87.4 fl (ref 78.0–100.0)
MONOS PCT: 7.7 % (ref 3.0–12.0)
Monocytes Absolute: 0.7 10*3/uL (ref 0.1–1.0)
NEUTROS ABS: 5.9 10*3/uL (ref 1.4–7.7)
NEUTROS PCT: 62 % (ref 43.0–77.0)
Platelets: 212 10*3/uL (ref 150.0–400.0)
RBC: 4.06 Mil/uL — ABNORMAL LOW (ref 4.22–5.81)
RDW: 15.6 % — AB (ref 11.5–15.5)
WBC: 9.6 10*3/uL (ref 4.0–10.5)

## 2014-09-17 LAB — BASIC METABOLIC PANEL
BUN: 19 mg/dL (ref 6–23)
CALCIUM: 9 mg/dL (ref 8.4–10.5)
CO2: 29 mEq/L (ref 19–32)
CREATININE: 1.13 mg/dL (ref 0.40–1.50)
Chloride: 101 mEq/L (ref 96–112)
GFR: 64.86 mL/min (ref >60.00)
Glucose, Bld: 108 mg/dL — ABNORMAL HIGH (ref 70–99)
Potassium: 3.8 mEq/L (ref 3.5–5.1)
SODIUM: 136 meq/L (ref 135–145)

## 2014-09-18 DIAGNOSIS — R41841 Cognitive communication deficit: Secondary | ICD-10-CM | POA: Diagnosis not present

## 2014-09-18 DIAGNOSIS — R262 Difficulty in walking, not elsewhere classified: Secondary | ICD-10-CM | POA: Diagnosis not present

## 2014-09-18 DIAGNOSIS — R488 Other symbolic dysfunctions: Secondary | ICD-10-CM | POA: Diagnosis not present

## 2014-09-18 DIAGNOSIS — M6281 Muscle weakness (generalized): Secondary | ICD-10-CM | POA: Diagnosis not present

## 2014-09-18 DIAGNOSIS — M150 Primary generalized (osteo)arthritis: Secondary | ICD-10-CM | POA: Diagnosis not present

## 2014-09-18 DIAGNOSIS — R279 Unspecified lack of coordination: Secondary | ICD-10-CM | POA: Diagnosis not present

## 2014-09-18 HISTORY — PX: NM VQ LUNG SCAN (ARMC HX): HXRAD1205

## 2014-09-18 LAB — URINE CULTURE: Colony Count: 2000

## 2014-09-21 ENCOUNTER — Emergency Department (HOSPITAL_COMMUNITY)
Admission: EM | Admit: 2014-09-21 | Discharge: 2014-09-22 | Disposition: A | Discharge status: Home / Self Care | Payer: Medicare Other | Attending: Emergency Medicine | Admitting: Emergency Medicine

## 2014-09-21 ENCOUNTER — Emergency Department (HOSPITAL_COMMUNITY): Payer: Medicare Other

## 2014-09-21 ENCOUNTER — Encounter (HOSPITAL_COMMUNITY): Payer: Self-pay | Admitting: Emergency Medicine

## 2014-09-21 DIAGNOSIS — I252 Old myocardial infarction: Secondary | ICD-10-CM | POA: Diagnosis not present

## 2014-09-21 DIAGNOSIS — Z7982 Long term (current) use of aspirin: Secondary | ICD-10-CM | POA: Insufficient documentation

## 2014-09-21 DIAGNOSIS — M1990 Unspecified osteoarthritis, unspecified site: Secondary | ICD-10-CM | POA: Diagnosis not present

## 2014-09-21 DIAGNOSIS — I129 Hypertensive chronic kidney disease with stage 1 through stage 4 chronic kidney disease, or unspecified chronic kidney disease: Secondary | ICD-10-CM | POA: Diagnosis not present

## 2014-09-21 DIAGNOSIS — J8410 Pulmonary fibrosis, unspecified: Secondary | ICD-10-CM | POA: Diagnosis not present

## 2014-09-21 DIAGNOSIS — Z79899 Other long term (current) drug therapy: Secondary | ICD-10-CM | POA: Insufficient documentation

## 2014-09-21 DIAGNOSIS — Z87448 Personal history of other diseases of urinary system: Secondary | ICD-10-CM | POA: Insufficient documentation

## 2014-09-21 DIAGNOSIS — Z87891 Personal history of nicotine dependence: Secondary | ICD-10-CM | POA: Diagnosis not present

## 2014-09-21 DIAGNOSIS — R0602 Shortness of breath: Secondary | ICD-10-CM

## 2014-09-21 DIAGNOSIS — N183 Chronic kidney disease, stage 3 (moderate): Secondary | ICD-10-CM | POA: Insufficient documentation

## 2014-09-21 DIAGNOSIS — F0390 Unspecified dementia without behavioral disturbance: Secondary | ICD-10-CM | POA: Diagnosis not present

## 2014-09-21 DIAGNOSIS — F329 Major depressive disorder, single episode, unspecified: Secondary | ICD-10-CM | POA: Diagnosis not present

## 2014-09-21 DIAGNOSIS — Z880 Allergy status to penicillin: Secondary | ICD-10-CM | POA: Insufficient documentation

## 2014-09-21 LAB — CBC
HEMATOCRIT: 34 % — AB (ref 39.0–52.0)
HEMOGLOBIN: 11.5 g/dL — AB (ref 13.0–17.0)
MCH: 29.7 pg (ref 26.0–34.0)
MCHC: 33.8 g/dL (ref 30.0–36.0)
MCV: 87.9 fL (ref 78.0–100.0)
Platelets: 229 10*3/uL (ref 150–400)
RBC: 3.87 MIL/uL — ABNORMAL LOW (ref 4.22–5.81)
RDW: 14.9 % (ref 11.5–15.5)
WBC: 10.4 10*3/uL (ref 4.0–10.5)

## 2014-09-21 LAB — BASIC METABOLIC PANEL
Anion gap: 13 (ref 5–15)
BUN: 25 mg/dL — AB (ref 6–23)
CO2: 24 mmol/L (ref 19–32)
Calcium: 9 mg/dL (ref 8.4–10.5)
Chloride: 101 mmol/L (ref 96–112)
Creatinine, Ser: 1.94 mg/dL — ABNORMAL HIGH (ref 0.50–1.35)
GFR calc Af Amer: 34 mL/min — ABNORMAL LOW (ref >90)
GFR, EST NON AFRICAN AMERICAN: 29 mL/min — AB (ref >90)
Glucose, Bld: 121 mg/dL — ABNORMAL HIGH (ref 70–99)
POTASSIUM: 3.2 mmol/L — AB (ref 3.5–5.1)
SODIUM: 138 mmol/L (ref 135–145)

## 2014-09-21 LAB — BRAIN NATRIURETIC PEPTIDE: B NATRIURETIC PEPTIDE 5: 131.4 pg/mL — AB (ref 0.0–100.0)

## 2014-09-21 LAB — I-STAT TROPONIN, ED: TROPONIN I, POC: 0.01 ng/mL (ref 0.00–0.08)

## 2014-09-21 NOTE — ED Notes (Signed)
Bed: [facility] Expected date:  Expected time:  Means of arrival:  Comments: EMS 81 yo male from [place] SOB x 3 days

## 2014-09-21 NOTE — ED Notes (Signed)
Xray bedside.

## 2014-09-21 NOTE — ED Notes (Signed)
Bed: [facility]11 Expected date:  Expected time:  Means of arrival:  Comments: [facility] 

## 2014-09-21 NOTE — ED Notes (Addendum)
Per EMS: Pt from [place]. Pt c/o SOB x 3 days. Upon EMS arrival pt appeared anxious and short of breath. Lung sounds clear. O2 97% on RA. Pt given albuterol 5 mg en route. Nursing home states pt does not have CHF, but states he takes Lasix and they weigh him every day. NSR on monitor.

## 2014-09-22 ENCOUNTER — Emergency Department (HOSPITAL_COMMUNITY): Payer: Medicare Other

## 2014-09-22 DIAGNOSIS — J8410 Pulmonary fibrosis, unspecified: Secondary | ICD-10-CM | POA: Diagnosis not present

## 2014-09-22 DIAGNOSIS — R0602 Shortness of breath: Secondary | ICD-10-CM | POA: Diagnosis not present

## 2014-09-22 LAB — TROPONIN I: Troponin I: <0.03 ng/mL (ref <0.031)

## 2014-09-22 LAB — D-DIMER, QUANTITATIVE (NOT AT ARMC): D DIMER QUANT: 1.41 ug{FEU}/mL — AB (ref 0.00–0.48)

## 2014-09-22 MED ORDER — ALBUTEROL SULFATE (2.5 MG/3ML) 0.083% IN NEBU
5.0000 mg | INHALATION_SOLUTION | Freq: Once | RESPIRATORY_TRACT | Status: AC
  Administered 2014-09-22: 5 mg via RESPIRATORY_TRACT
  Filled 2014-09-22: qty 6

## 2014-09-22 MED ORDER — TECHNETIUM TC 99M DIETHYLENETRIAME-PENTAACETIC ACID: 43.0000 | Freq: Once | INTRAVENOUS | Status: DC | PRN

## 2014-09-22 MED ORDER — LORAZEPAM 0.5 MG PO TABS
0.5000 mg | ORAL_TABLET | Freq: Once | ORAL | Status: AC
  Administered 2014-09-22: 0.5 mg via ORAL
  Filled 2014-09-22: qty 1

## 2014-09-22 MED ORDER — TECHNETIUM TO 99M ALBUMIN AGGREGATED
5.5000 | Freq: Once | INTRAVENOUS | Status: AC | PRN
  Administered 2014-09-22: 6 via INTRAVENOUS

## 2014-09-22 MED ORDER — IPRATROPIUM BROMIDE 0.02 % IN SOLN
0.5000 mg | Freq: Once | RESPIRATORY_TRACT | Status: AC
  Administered 2014-09-22: 0.5 mg via RESPIRATORY_TRACT
  Filled 2014-09-22: qty 2.5

## 2014-09-22 NOTE — ED Notes (Signed)
Pt transported to [facility] 

## 2014-09-22 NOTE — ED Notes (Signed)
Pt's O2 dropped down to 88% while pt was asleep, came up to 92% when pt woken up. Pt placed on 2 L [place], O2 up to 97%.

## 2014-09-22 NOTE — ED Provider Notes (Signed)
CSN: [id number]     Arrival date & time 09/21/14  2207 History   First MD Initiated Contact with Patient 09/22/14 0013     Chief Complaint  Patient presents with  . Shortness of Breath   Level V caveat for dementia  (Consider location/radiation/quality/duration/timing/severity/associated sxs/prior Treatment) HPI  Patient presents from his nursing facility stating he's been having trouble breathing for the past 3 days. He states he has a mild cough. He denies fever. He states he's having upper chest discomfort for the past 3 days that comes and goes. He states he has been having "excess fluid" and he has been getting medication for that.   PCP Dr Anitra Lauth  Past Medical History  Diagnosis Date  . Osteoarthritis     hands/wrists  . Depression     antidepressant x 1 yr--situational. Resolved 08/2013 per pt/son.  . Glaucoma   . Hypertension   . 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  No significant residual deficit except short term memory impairment  . Urine incontinence   . Frequent headaches   . Chronic venous insufficiency     compression hose  . Peripheral neuropathy 2013    Dx'd by neuro in [place] at the time the patient was hospitalized briefly for pontine CVA.  . NSTEMI (non-ST elevated myocardial infarction) 06/2011    [place]: Troponin mildly elevated, normal EKG, normal ECHO, pt declined cardiology consult--per old PCP records.  . Basal cell carcinoma 2011    Face  . Chronic renal insufficiency, stage III (moderate) 2013    CrCl in the 50s  . Chronic diastolic heart failure   . Chronic left shoulder pain 09/14/2013  . Normocytic anemia 2015    secondary to CRI (iron studies ok 12/2013)  . Shortness of breath   . Chronic back pain   . Dementia 2016    Dr. Delice Lesch 08/2014--started aricept   Past Surgical History  Procedure Laterality Date  . Cholecystectomy    . Tonsilectomy, adenoidectomy, bilateral myringotomy and tubes    . Transthoracic echocardiogram   12/18/13    Grade 1 diast dysf, o/w normal   Family History  Problem Relation Age of Onset  . Cancer Mother   . Heart disease Mother    History  Substance Use Topics  . Smoking status: Former Smoker    Types: Pipe  . Smokeless tobacco: Never Used  . Alcohol Use: No  lives in a facility  Review of Systems  Unable to perform ROS: Dementia      Allergies  Altace; Amoxicillin; and Cozaar  Home Medications   Prior to Admission medications   Medication Sig Start Date End Date Taking? Authorizing Provider  aspirin 81 MG chewable tablet Chew 81 mg by mouth daily.   Yes Historical Provider, MD  atorvastatin (LIPITOR) 20 MG tablet Take 20 mg by mouth every evening.  10/28/13  Yes Historical Provider, MD  azelastine (OPTIVAR) 0.05 % ophthalmic solution Place 1 drop into the right eye 2 (two) times daily.  07/22/13  Yes Historical Provider, MD  baclofen (LIORESAL) 10 MG tablet Take 10 mg by mouth 3 (three) times daily as needed for muscle spasms.    Yes Historical Provider, MD  betaxolol (BETOPTIC-S) 0.25 % ophthalmic suspension Place 1 drop into the right eye daily.   Yes Historical Provider, MD  finasteride (PROSCAR) 5 MG tablet Take 1 tablet (5 mg total) by mouth daily. 01/28/14  Yes [name], MD  furosemide (LASIX) 40 MG tablet Take  40 mg by mouth daily.   Yes Historical Provider, MD  hydrALAZINE (APRESOLINE) 10 MG tablet Take 10 mg by mouth 3 (three) times daily.  08/12/13  Yes Historical Provider, MD  HYDROcodone-acetaminophen (NORCO/VICODIN) 5-325 MG per tablet Take 1 tablet by mouth every 6 (six) hours as needed for moderate pain. 07/02/14  Yes [name], MD  levobunolol (BETAGAN) 0.5 % ophthalmic solution Place 1 drop into both eyes 2 (two) times daily.   Yes Historical Provider, MD  Menthol, Topical Analgesic, 4 % GEL Apply 1 application topically 3 (three) times daily. Applies to back   Yes Historical Provider, MD  sulfamethoxazole-trimethoprim (BACTRIM DS,SEPTRA DS)  800-160 MG per tablet Take 1 tablet by mouth 2 (two) times daily. 09/16/14  Yes [name], MD  tamsulosin (FLOMAX) 0.4 MG CAPS capsule Take 2 capsules (0.8 mg total) by mouth daily after supper. 01/02/14  Yes [name], MD  donepezil (ARICEPT) 10 MG tablet Take 1/2 tablet daily for 1 week, then increase to 1 tablet daily Patient not taking: Reported on 09/21/2014 08/24/14   Cameron Sprang, MD  temazepam (RESTORIL) 7.5 MG capsule Take 1 capsule (7.5 mg total) by mouth at bedtime as needed for sleep. Patient not taking: Reported on 09/21/2014 01/14/14   [name], MD   BP 109/43 mmHg  Pulse 78  Temp(Src) 98.3 F (36.8 C) (Oral)  Resp 23  SpO2 97%  Vital signs normal   Physical Exam  Constitutional: He appears well-developed and well-nourished.  Non-toxic appearance. He does not appear ill. No distress.  HENT:  Head: Normocephalic and atraumatic.  Right Ear: External ear normal.  Left Ear: External ear normal.  Nose: Nose normal. No mucosal edema or rhinorrhea.  Mouth/Throat: Oropharynx is clear and moist and mucous membranes are normal. No dental abscesses or uvula swelling.  Eyes: Conjunctivae and EOM are normal. Pupils are equal, round, and reactive to light.  Neck: Normal range of motion and full passive range of motion without pain. Neck supple.  Cardiovascular: Normal rate, regular rhythm and normal heart sounds.  Exam reveals no gallop and no friction rub.   No murmur heard. Pulmonary/Chest: Effort normal and breath sounds normal. No respiratory distress. He has no wheezes. He has no rhonchi. He has no rales. He exhibits no tenderness and no crepitus.    Patient was calmly watching TV when I entered the room. When he became aware that I was in the room he started making audible gasping sounds as if he were having trouble breathing. Area of chest discomfort noted  Abdominal: Soft. Normal appearance and bowel sounds are normal. He exhibits no distension. There is no  tenderness. There is no rebound and no guarding.  Musculoskeletal: Normal range of motion. He exhibits edema. He exhibits no tenderness.  Moves all extremities well. Patient has mild diffuse redness of his lower extremities bilaterally. There is 1+ edema bilaterally. His lower extremities are warm to touch equally.  Neurological: He is alert. He has normal strength. No cranial nerve deficit.  Skin: Skin is warm, dry and intact. No rash noted. No erythema. No pallor.  Psychiatric: He has a normal mood and affect. His speech is normal and behavior is normal. His mood appears not anxious.  Nursing note and vitals reviewed.   ED Course  Procedures (including critical care time)  Medications  albuterol (PROVENTIL) (2.5 MG/3ML) 0.083% nebulizer solution 5 mg (5 mg Nebulization Given 09/22/14 0107)  ipratropium (ATROVENT) nebulizer solution 0.5 mg (0.5  mg Nebulization Given 09/22/14 0107)  LORazepam (ATIVAN) tablet 0.5 mg (0.5 mg Oral Given 09/22/14 0106)    PT had no persistant evidence of SOB while in the ED, mainly when someone went into his room.  CT angiogram of the chest was ordered however patient has significant renal insufficiency and can have that test done. V/Q scan was ordered.  08:30 Dr Roderic Palau will check his V/Q results.    Labs Review Results for orders placed or performed during the hospital encounter of 09/21/14  CBC  Result Value Ref Range   WBC 10.4 4.0 - 10.5 K/uL   RBC 3.87 (L) 4.22 - 5.81 MIL/uL   Hemoglobin 11.5 (L) 13.0 - 17.0 g/dL   HCT 34.0 (L) 39.0 - 52.0 %   MCV 87.9 78.0 - 100.0 fL   MCH 29.7 26.0 - 34.0 pg   MCHC 33.8 30.0 - 36.0 g/dL   RDW 14.9 11.5 - 15.5 %   Platelets 229 150 - 400 K/uL  Basic metabolic panel  Result Value Ref Range   Sodium 138 135 - 145 mmol/L   Potassium 3.2 (L) 3.5 - 5.1 mmol/L   Chloride 101 96 - 112 mmol/L   CO2 24 19 - 32 mmol/L   Glucose, Bld 121 (H) 70 - 99 mg/dL   BUN 25 (H) 6 - 23 mg/dL   Creatinine, Ser 1.94 (H) 0.50 - 1.35  mg/dL   Calcium 9.0 8.4 - 10.5 mg/dL   GFR calc non Af Amer 29 (L) >90 mL/min   GFR calc Af Amer 34 (L) >90 mL/min   Anion gap 13 5 - 15  BNP (order ONLY if patient complains of dyspnea/SOB AND you have documented it for THIS visit)  Result Value Ref Range   B Natriuretic Peptide 131.4 (H) 0.0 - 100.0 pg/mL  Troponin I  Result Value Ref Range   Troponin I <0.03 <0.031 ng/mL  D-dimer, quantitative  Result Value Ref Range   D-Dimer, Quant 1.41 (H) 0.00 - 0.48 ug/mL-FEU  I-stat troponin, ED (not at [facility])  Result Value Ref Range   Troponin i, poc 0.01 0.00 - 0.08 ng/mL   Comment 3           Laboratory interpretation all normal except hypokalemia, renal insufficiency, mild anemia, elevated ddimer     Imaging Review Dg Chest Port 1 View  09/21/2014   CLINICAL DATA:  Shortness of breath. Unable to give history. On breathing tube.  EXAM: PORTABLE CHEST - 1 VIEW  COMPARISON:  09/10/2014  FINDINGS: Shallow inspiration. Heart size and pulmonary vascularity are normal for inspiratory effort. Interstitial changes in the lungs probably due to fibrosis. No focal airspace consolidation. No blunting of costophrenic angles. No pneumothorax. Calcified and tortuous aorta. Degenerative changes in the spine and shoulders. Surgical clips in the right upper quadrant.  IMPRESSION: Shallow inspiration. Probable pulmonary fibrosis. No evidence of active consolidation.   Electronically Signed   By: Lucienne Capers M.D.   On: 09/21/2014 22:53     EKG Interpretation   Date/Time:  Monday September 21 2014 22:24:35 EDT Ventricular Rate:  74 PR Interval:  140 QRS Duration: 96 QT Interval:  437 QTC Calculation: 485 R Axis:   -27 Text Interpretation:  Sinus rhythm Abnormal R-wave progression, late  transition Probable LVH with secondary repol abnrm Borderline prolonged QT  interval No significant change was found Confirmed by Wyvonnia Dusky  MD, STEPHEN  [phone]) on 09/21/2014 10:40:10 PM      MDM   Final diagnoses:  Shortness of breath   Disposition pending   Rolland Porter, MD, Barbette Or, MD 09/22/14 [id number]

## 2014-09-22 NOTE — ED Notes (Signed)
MD at bedside. 

## 2014-09-22 NOTE — ED Notes (Signed)
Pt will be scanned by nuclear medicine this am because he has abnormal lab work.

## 2014-09-22 NOTE — Discharge Instructions (Signed)
Follow up with your family md this week if any problems

## 2014-09-23 ENCOUNTER — Telehealth: Payer: Self-pay | Admitting: *Deleted

## 2014-09-23 NOTE — Telephone Encounter (Signed)
No. I will likely start him on an antidepressant for depression and anxiety, but I am not going to give him another medication that will sedate him. I will talk with the patient's son about this in the near future.

## 2014-09-23 NOTE — Telephone Encounter (Signed)
Elizabeth from [facility] left vm requesting that pt be re-prescribed Ativan 0.5 mg? [name] stated that pt has been to ED on two different occasions for anxiety and sob. Also that pt did well on Ativan last time. Please advise?

## 2014-09-24 DIAGNOSIS — R262 Difficulty in walking, not elsewhere classified: Secondary | ICD-10-CM | POA: Diagnosis not present

## 2014-09-24 DIAGNOSIS — M6281 Muscle weakness (generalized): Secondary | ICD-10-CM | POA: Diagnosis not present

## 2014-09-24 DIAGNOSIS — R41841 Cognitive communication deficit: Secondary | ICD-10-CM | POA: Diagnosis not present

## 2014-09-24 DIAGNOSIS — R279 Unspecified lack of coordination: Secondary | ICD-10-CM | POA: Diagnosis not present

## 2014-09-24 DIAGNOSIS — R488 Other symbolic dysfunctions: Secondary | ICD-10-CM | POA: Diagnosis not present

## 2014-09-24 DIAGNOSIS — M150 Primary generalized (osteo)arthritis: Secondary | ICD-10-CM | POA: Diagnosis not present

## 2014-09-24 NOTE — Telephone Encounter (Signed)
Lance Kemp from [facility] advised.

## 2014-09-25 ENCOUNTER — Emergency Department (HOSPITAL_COMMUNITY)
Admission: EM | Admit: 2014-09-25 | Discharge: 2014-09-25 | Disposition: A | Discharge status: Home / Self Care | Payer: Medicare Other | Attending: Emergency Medicine | Admitting: Emergency Medicine

## 2014-09-25 ENCOUNTER — Encounter: Payer: Self-pay | Admitting: Family Medicine

## 2014-09-25 ENCOUNTER — Emergency Department (HOSPITAL_COMMUNITY): Payer: Medicare Other

## 2014-09-25 ENCOUNTER — Ambulatory Visit (INDEPENDENT_AMBULATORY_CARE_PROVIDER_SITE_OTHER): Payer: Medicare Other | Admitting: Family Medicine

## 2014-09-25 ENCOUNTER — Encounter (HOSPITAL_COMMUNITY): Payer: Self-pay

## 2014-09-25 VITALS — BP 130/80 | HR 83 | Temp 97.4°F | Ht 65.0 in

## 2014-09-25 DIAGNOSIS — Z7982 Long term (current) use of aspirin: Secondary | ICD-10-CM | POA: Diagnosis not present

## 2014-09-25 DIAGNOSIS — H409 Unspecified glaucoma: Secondary | ICD-10-CM | POA: Insufficient documentation

## 2014-09-25 DIAGNOSIS — G8929 Other chronic pain: Secondary | ICD-10-CM | POA: Diagnosis not present

## 2014-09-25 DIAGNOSIS — Z87891 Personal history of nicotine dependence: Secondary | ICD-10-CM | POA: Diagnosis not present

## 2014-09-25 DIAGNOSIS — R079 Chest pain, unspecified: Secondary | ICD-10-CM

## 2014-09-25 DIAGNOSIS — F0390 Unspecified dementia without behavioral disturbance: Secondary | ICD-10-CM | POA: Diagnosis not present

## 2014-09-25 DIAGNOSIS — I5032 Chronic diastolic (congestive) heart failure: Secondary | ICD-10-CM

## 2014-09-25 DIAGNOSIS — R0602 Shortness of breath: Secondary | ICD-10-CM | POA: Insufficient documentation

## 2014-09-25 DIAGNOSIS — Z8673 Personal history of transient ischemic attack (TIA), and cerebral infarction without residual deficits: Secondary | ICD-10-CM | POA: Diagnosis not present

## 2014-09-25 DIAGNOSIS — R0600 Dyspnea, unspecified: Secondary | ICD-10-CM | POA: Diagnosis not present

## 2014-09-25 DIAGNOSIS — F1193 Opioid use, unspecified with withdrawal: Secondary | ICD-10-CM

## 2014-09-25 DIAGNOSIS — F418 Other specified anxiety disorders: Secondary | ICD-10-CM

## 2014-09-25 DIAGNOSIS — N183 Chronic kidney disease, stage 3 unspecified: Secondary | ICD-10-CM

## 2014-09-25 DIAGNOSIS — Z880 Allergy status to penicillin: Secondary | ICD-10-CM | POA: Insufficient documentation

## 2014-09-25 DIAGNOSIS — I214 Non-ST elevation (NSTEMI) myocardial infarction: Secondary | ICD-10-CM | POA: Diagnosis not present

## 2014-09-25 DIAGNOSIS — I129 Hypertensive chronic kidney disease with stage 1 through stage 4 chronic kidney disease, or unspecified chronic kidney disease: Secondary | ICD-10-CM | POA: Insufficient documentation

## 2014-09-25 DIAGNOSIS — N2889 Other specified disorders of kidney and ureter: Secondary | ICD-10-CM

## 2014-09-25 DIAGNOSIS — F32A Depression, unspecified: Secondary | ICD-10-CM

## 2014-09-25 DIAGNOSIS — F410 Panic disorder [episodic paroxysmal anxiety] without agoraphobia: Secondary | ICD-10-CM

## 2014-09-25 DIAGNOSIS — F329 Major depressive disorder, single episode, unspecified: Secondary | ICD-10-CM | POA: Diagnosis not present

## 2014-09-25 DIAGNOSIS — F1123 Opioid dependence with withdrawal: Secondary | ICD-10-CM | POA: Diagnosis not present

## 2014-09-25 DIAGNOSIS — Z85828 Personal history of other malignant neoplasm of skin: Secondary | ICD-10-CM | POA: Diagnosis not present

## 2014-09-25 DIAGNOSIS — Z79899 Other long term (current) drug therapy: Secondary | ICD-10-CM | POA: Diagnosis not present

## 2014-09-25 DIAGNOSIS — N189 Chronic kidney disease, unspecified: Secondary | ICD-10-CM | POA: Diagnosis not present

## 2014-09-25 DIAGNOSIS — I872 Venous insufficiency (chronic) (peripheral): Secondary | ICD-10-CM

## 2014-09-25 DIAGNOSIS — F419 Anxiety disorder, unspecified: Secondary | ICD-10-CM

## 2014-09-25 LAB — I-STAT VENOUS BLOOD GAS, ED
BICARBONATE: 25.2 meq/L — AB (ref 20.0–24.0)
O2 Saturation: 76 %
PO2 VEN: 42 mmHg (ref 30.0–45.0)
TCO2: 27 mmol/L (ref 0–100)
pCO2, Ven: 42.5 mmHg — ABNORMAL LOW (ref 45.0–50.0)
pH, Ven: 7.381 — ABNORMAL HIGH (ref 7.250–7.300)

## 2014-09-25 LAB — BASIC METABOLIC PANEL
ANION GAP: 10 (ref 5–15)
BUN: 22 mg/dL (ref 6–23)
CALCIUM: 8.9 mg/dL (ref 8.4–10.5)
CHLORIDE: 106 mmol/L (ref 96–112)
CO2: 24 mmol/L (ref 19–32)
CREATININE: 1.83 mg/dL — AB (ref 0.50–1.35)
GFR calc non Af Amer: 31 mL/min — ABNORMAL LOW (ref >90)
GFR, EST AFRICAN AMERICAN: 36 mL/min — AB (ref >90)
Glucose, Bld: 125 mg/dL — ABNORMAL HIGH (ref 70–99)
Potassium: 4.2 mmol/L (ref 3.5–5.1)
SODIUM: 140 mmol/L (ref 135–145)

## 2014-09-25 LAB — I-STAT CHEM 8, ED
BUN: 25 mg/dL — ABNORMAL HIGH (ref 6–23)
CREATININE: 1.7 mg/dL — AB (ref 0.50–1.35)
Calcium, Ion: 1.16 mmol/L (ref 1.13–1.30)
Chloride: 104 mmol/L (ref 96–112)
GLUCOSE: 124 mg/dL — AB (ref 70–99)
HCT: 34 % — ABNORMAL LOW (ref 39.0–52.0)
Hemoglobin: 11.6 g/dL — ABNORMAL LOW (ref 13.0–17.0)
Potassium: 4.2 mmol/L (ref 3.5–5.1)
SODIUM: 141 mmol/L (ref 135–145)
TCO2: 22 mmol/L (ref 0–100)

## 2014-09-25 LAB — CBC WITH DIFFERENTIAL/PLATELET
BASOS PCT: 1 % (ref 0–1)
Basophils Absolute: 0.1 10*3/uL (ref 0.0–0.1)
Eosinophils Absolute: 0.4 10*3/uL (ref 0.0–0.7)
Eosinophils Relative: 5 % (ref 0–5)
HEMATOCRIT: 31.7 % — AB (ref 39.0–52.0)
Hemoglobin: 10.8 g/dL — ABNORMAL LOW (ref 13.0–17.0)
Lymphocytes Relative: 32 % (ref 12–46)
Lymphs Abs: 2.5 10*3/uL (ref 0.7–4.0)
MCH: 29.5 pg (ref 26.0–34.0)
MCHC: 34.1 g/dL (ref 30.0–36.0)
MCV: 86.6 fL (ref 78.0–100.0)
MONO ABS: 0.9 10*3/uL (ref 0.1–1.0)
Monocytes Relative: 12 % (ref 3–12)
NEUTROS ABS: 3.9 10*3/uL (ref 1.7–7.7)
Neutrophils Relative %: 50 % (ref 43–77)
Platelets: 211 10*3/uL (ref 150–400)
RBC: 3.66 MIL/uL — AB (ref 4.22–5.81)
RDW: 14.8 % (ref 11.5–15.5)
WBC: 7.9 10*3/uL (ref 4.0–10.5)

## 2014-09-25 LAB — PROTIME-INR
INR: 1.13 (ref 0.00–1.49)
Prothrombin Time: 14.7 seconds (ref 11.6–15.2)

## 2014-09-25 LAB — I-STAT TROPONIN, ED: TROPONIN I, POC: 0 ng/mL (ref 0.00–0.08)

## 2014-09-25 MED ORDER — ACETAMINOPHEN 325 MG PO TABS
650.0000 mg | ORAL_TABLET | Freq: Once | ORAL | Status: AC
  Administered 2014-09-25: 650 mg via ORAL
  Filled 2014-09-25: qty 2

## 2014-09-25 MED ORDER — HYDROCODONE-ACETAMINOPHEN 5-325 MG PO TABS: ORAL_TABLET | ORAL | Status: DC

## 2014-09-25 MED ORDER — CITALOPRAM HYDROBROMIDE 10 MG PO TABS
10.0000 mg | ORAL_TABLET | Freq: Every day | ORAL | Status: DC
Start: 2014-09-25 — End: 2015-07-30

## 2014-09-25 NOTE — Progress Notes (Signed)
Pre visit review using our clinic review tool, if applicable. No additional management support is needed unless otherwise documented below in the visit note. 

## 2014-09-25 NOTE — Discharge Instructions (Signed)

## 2014-09-25 NOTE — ED Notes (Signed)
Per EMS - 65min onset CP and SOB (unable to describe CP - pointed to substernal). Pt also anxious. Preston did not improve shortness of breath; NRB some improvement. Pt increased confusion (per nursing home, no hx of dementia). Pt did have UTI within last several weeks. Pt lung sounds clear. Given 2 nitro w/ no change in CP, 324mg  aspirin. 22G Left hand, 100% RA, 74bpm, 111/48, CBG 152.

## 2014-09-25 NOTE — Progress Notes (Signed)
OFFICE VISIT  09/25/2014   CC:  Chief Complaint  Patient presents with  . Shortness of Breath   HPI:    Patient is a 81 y.o. Caucasian male who presents for ED f/u for SOB. Has been to ED x 2 in the last week (09/21/14 and early this morning) for intermittent c/o feeling SOB. Nurses at [facility] have called/sent messages here to the office requesting prn ativan b/c they feel the patient has been exhibiting panic attack sx's.  Review of ED notes and testing seem to support possible psych undertones to his sx's, no cardiac or pulm problem has been found (CXR's, EKGs, V/Q scan, oxygen testing). His Cr has been up over baseline at these visits he has still been getting his lasix 40mg  daily (despite poor PO intake). Additional hx: he had been getting vicodin 5/325, 1 tab q6h on a scheduled basis for approx 2 and 1/2 months (despite our attempts to get the ALF to give this med to him only on a q6h PRN basis.  We finally got them to change this to q6 prn approx 10 d/a.  It is likely not coincidental that his increase in anxiety/poor mood/decreased appetite, increased tremulousness, increased feeling of CP/SOB/panic have started not long after he abruptly stopped this med.  He has asked for the med for pain only rarely since it was changed to prn and pt is unable to say today whether or not it made his current complaints feel any better.  Also, last visit when he was here I felt like he may have some prostatitis making his chronic urge incontinence worse so I started him on bactrim.  He has no objective signs of allergic rxn but I will stop this med today just to take this possibility out of the equation.   Past Medical History  Diagnosis Date  . Osteoarthritis     hands/wrists  . Depression     antidepressant x 1 yr--situational. Resolved 08/2013 per pt/son.  . Glaucoma   . Hypertension   . 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  No significant residual deficit except short term memory  impairment  . Urine incontinence   . Frequent headaches   . Chronic venous insufficiency     compression hose  . Peripheral neuropathy 2013    Dx'd by neuro in [place] at the time the patient was hospitalized briefly for pontine CVA.  . NSTEMI (non-ST elevated myocardial infarction) 06/2011    [place]: Troponin mildly elevated, normal EKG, normal ECHO, pt declined cardiology consult--per old PCP records.  . Basal cell carcinoma 2011    Face  . Chronic renal insufficiency, stage III (moderate) 2013    CrCl in the 50s  . Chronic diastolic heart failure   . Chronic left shoulder pain 09/14/2013  . Normocytic anemia 2015    secondary to CRI (iron studies ok 12/2013)  . Shortness of breath   . Chronic back pain   . Dementia 2016    Dr. Delice Lesch 08/2014--started aricept    Past Surgical History  Procedure Laterality Date  . Cholecystectomy    . Tonsilectomy, adenoidectomy, bilateral myringotomy and tubes    . Transthoracic echocardiogram  12/18/13    Grade 1 diast dysf, o/w normal  . Nm vq lung scan ([facility] hx)  09/2014    Low risk    Outpatient Prescriptions Prior to Visit  Medication Sig Dispense Refill  . aspirin 81 MG chewable tablet Chew 81 mg by mouth daily.    Marland Kitchen  atorvastatin (LIPITOR) 20 MG tablet Take 20 mg by mouth every evening.     . baclofen (LIORESAL) 10 MG tablet Take 10 mg by mouth 3 (three) times daily as needed for muscle spasms.     . betaxolol (BETOPTIC-S) 0.25 % ophthalmic suspension Place 1 drop into the right eye daily.    . finasteride (PROSCAR) 5 MG tablet Take 1 tablet (5 mg total) by mouth daily. 30 tablet 6  . hydrALAZINE (APRESOLINE) 10 MG tablet Take 10 mg by mouth 3 (three) times daily.     Marland Kitchen levobunolol (BETAGAN) 0.5 % ophthalmic solution Place 1 drop into both eyes 2 (two) times daily.    . Menthol, Topical Analgesic, 4 % GEL Apply 1 application topically 3 (three) times daily. Applies to back    . tamsulosin (FLOMAX) 0.4 MG CAPS capsule Take 2 capsules (0.8  mg total) by mouth daily after supper. 60 capsule 6  . temazepam (RESTORIL) 7.5 MG capsule Take 1 capsule (7.5 mg total) by mouth at bedtime as needed for sleep. 30 capsule 5  . azelastine (OPTIVAR) 0.05 % ophthalmic solution Place 1 drop into the right eye 2 (two) times daily.     Marland Kitchen donepezil (ARICEPT) 10 MG tablet Take 1/2 tablet daily for 1 week, then increase to 1 tablet daily 30 tablet 6  . HYDROcodone-acetaminophen (NORCO/VICODIN) 5-325 MG per tablet Take 1 tablet by mouth every 6 (six) hours as needed for moderate pain. 60 tablet 0  . sulfamethoxazole-trimethoprim (BACTRIM DS,SEPTRA DS) 800-160 MG per tablet Take 1 tablet by mouth 2 (two) times daily. 28 tablet 0  . traMADol (ULTRAM) 50 MG tablet Take 25 mg by mouth every 12 (twelve) hours as needed for moderate pain.    . furosemide (LASIX) 40 MG tablet Take 40 mg by mouth daily.     No facility-administered medications prior to visit.    Allergies  Allergen Reactions  . Altace [Ramipril] Other (See Comments)    As per old records from [place]  . Amoxicillin Other (See Comments)    Per old records from [place]  . Cozaar [Losartan Potassium] Other (See Comments)    As per old records from [place]    ROS As per HPI  PE: Blood pressure 130/80, pulse 83, temperature 97.4 F (36.3 C), temperature source Oral, height 5\' 5"  (1.651 m), SpO2 95 %. Gen: alert, attentive, intermittently takes short/abrupt breaths.  Color is pink.  He converses normally, displays normal cognition given his mild chronic memory/cognitive impairment. ENT: lips pink, toungue and palate and throat all without swelling or erythema. Neck: nontender, no mass.  Trachea is midline. CV: RRR, no m/r/g LUNGS: CTA bilat, nonlabored resps. EXT: 1-2+ bilat LE pitting edema (pt is wearing knee high compression hose today) No tremors.  LABS:    Chemistry      Component Value Date/Time   NA 141 09/25/2014 0219   K 4.2 09/25/2014 0219   CL 104 09/25/2014 0219    CO2 24 09/25/2014 0211   BUN 25* 09/25/2014 0219   CREATININE 1.70* 09/25/2014 0219   CREATININE 1.44* 01/16/2014 1658      Component Value Date/Time   CALCIUM 8.9 09/25/2014 0211   ALKPHOS 78 02/19/2014 0909   AST 20 02/19/2014 0909   ALT 17 02/19/2014 0909   BILITOT 0.4 02/19/2014 0909     Lab Results  Component Value Date   WBC 7.9 09/25/2014   HGB 11.6* 09/25/2014   HCT 34.0* 09/25/2014   MCV 86.6 09/25/2014  PLT 211 09/25/2014    IMPRESSION AND PLAN:  1) Subjective SOB; pt has many symptoms suggestive of withdrawal from opiods (vicodin, see HPI). No sign of acute cardiopulmonary problems on 2 recent ED workups and in this office today. He has been having a building/gradual worsening of depression and anxiety, and I think his w/drawal sx's are pushing him over into an overt panic attack.  I will get him back on vicodin 5/325 1 tab bid and when I see him back in 1 wk I plan on cutting this back to 1 tab qd, with plan of eventually getting him back to using this med only prn. Additionally, will stop bactrim, as I think having him on this med right now only potentially compounds a confusing situation.   For his anxiety/depression, will start citalopram 10mg  qd.  Discussed the fact that this med may take a few weeks to start helping, plus may have to increase dose.    2) CRI, worsened at recent ED visits.  Stop lasix. Recheck BMET at next f/u in 6d.  An After Visit Summary was printed and given to the patient.  Spent 30 min with pt today, with >50% of this time spent in counseling and care coordination regarding the above problems.  FOLLOW UP: Return for keep appt already scheduled for next week.

## 2014-09-25 NOTE — ED Notes (Signed)
[facility] contacted to tx patient home 

## 2014-09-25 NOTE — ED Provider Notes (Signed)
CSN: [id number]     Arrival date & time 09/25/14  0146 History   First MD Initiated Contact with Patient 09/25/14 0235     Chief Complaint  Patient presents with  . Chest Pain  . Shortness of Breath     (Consider location/radiation/quality/duration/timing/severity/associated sxs/prior Treatment) Patient is a 81 y.o. male presenting with shortness of breath. The history is provided by the patient. No language interpreter was used.  Shortness of Breath Severity:  Moderate Duration:  4 days Timing:  Constant Associated symptoms: chest pain   Associated symptoms: no fever   Associated symptoms comment:  The patient returns to the emergency department with complaint of shortness of breath. Initially he reported his symptoms started tonight. He also states that he was seen a few days ago for same and that symptoms have been persistent. No fever or cough. He reports chest pain. No vomiting.    Past Medical History  Diagnosis Date  . Osteoarthritis     hands/wrists  . Depression     antidepressant x 1 yr--situational. Resolved 08/2013 per pt/son.  . Glaucoma   . Hypertension   . 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  No significant residual deficit except short term memory impairment  . Urine incontinence   . Frequent headaches   . Chronic venous insufficiency     compression hose  . Peripheral neuropathy 2013    Dx'd by neuro in [place] at the time the patient was hospitalized briefly for [place] CVA.  . NSTEMI (non-ST elevated myocardial infarction) 06/2011    [place]: Troponin mildly elevated, normal EKG, normal ECHO, pt declined cardiology consult--per old PCP records.  . Basal cell carcinoma 2011    Face  . Chronic renal insufficiency, stage III (moderate) 2013    CrCl in the 50s  . Chronic diastolic heart failure   . Chronic left shoulder pain 09/14/2013  . Normocytic anemia 2015    secondary to CRI (iron studies ok 12/2013)  . Shortness of breath   . Chronic back  pain   . Dementia 2016    Dr. Delice Lesch 08/2014--started aricept   Past Surgical History  Procedure Laterality Date  . Cholecystectomy    . Tonsilectomy, adenoidectomy, bilateral myringotomy and tubes    . Transthoracic echocardiogram  12/18/13    Grade 1 diast dysf, o/w normal   Family History  Problem Relation Age of Onset  . Cancer Mother   . Heart disease Mother    History  Substance Use Topics  . Smoking status: Former Smoker    Types: Pipe  . Smokeless tobacco: Never Used  . Alcohol Use: No    Review of Systems  Constitutional: Negative.  Negative for fever.  HENT: Negative.   Respiratory: Positive for shortness of breath.   Cardiovascular: Positive for chest pain.  Gastrointestinal: Negative.   Musculoskeletal: Negative.   Skin: Negative.   Neurological: Negative.       Allergies  Altace; Amoxicillin; and Cozaar  Home Medications   Prior to Admission medications   Medication Sig Start Date End Date Taking? Authorizing Provider  aspirin 81 MG chewable tablet Chew 81 mg by mouth daily.   Yes Historical Provider, MD  atorvastatin (LIPITOR) 20 MG tablet Take 20 mg by mouth every evening.  10/28/13  Yes Historical Provider, MD  azelastine (OPTIVAR) 0.05 % ophthalmic solution Place 1 drop into the right eye 2 (two) times daily.  07/22/13  Yes Historical Provider, MD  baclofen (LIORESAL) 10 MG tablet Take  10 mg by mouth 3 (three) times daily as needed for muscle spasms.    Yes Historical Provider, MD  betaxolol (BETOPTIC-S) 0.25 % ophthalmic suspension Place 1 drop into the right eye daily.   Yes Historical Provider, MD  finasteride (PROSCAR) 5 MG tablet Take 1 tablet (5 mg total) by mouth daily. 01/28/14  Yes [name], MD  furosemide (LASIX) 40 MG tablet Take 40 mg by mouth daily.   Yes Historical Provider, MD  hydrALAZINE (APRESOLINE) 10 MG tablet Take 10 mg by mouth 3 (three) times daily.  08/12/13  Yes Historical Provider, MD  HYDROcodone-acetaminophen  (NORCO/VICODIN) 5-325 MG per tablet Take 1 tablet by mouth every 6 (six) hours as needed for moderate pain. 07/02/14  Yes [name], MD  levobunolol (BETAGAN) 0.5 % ophthalmic solution Place 1 drop into both eyes 2 (two) times daily.   Yes Historical Provider, MD  Menthol, Topical Analgesic, 4 % GEL Apply 1 application topically 3 (three) times daily. Applies to back   Yes Historical Provider, MD  sulfamethoxazole-trimethoprim (BACTRIM DS,SEPTRA DS) 800-160 MG per tablet Take 1 tablet by mouth 2 (two) times daily. 09/16/14  Yes [name], MD  tamsulosin (FLOMAX) 0.4 MG CAPS capsule Take 2 capsules (0.8 mg total) by mouth daily after supper. 01/02/14  Yes [name], MD  traMADol (ULTRAM) 50 MG tablet Take 25 mg by mouth every 12 (twelve) hours as needed for moderate pain.   Yes Historical Provider, MD  donepezil (ARICEPT) 10 MG tablet Take 1/2 tablet daily for 1 week, then increase to 1 tablet daily Patient not taking: Reported on 09/21/2014 08/24/14   Cameron Sprang, MD  temazepam (RESTORIL) 7.5 MG capsule Take 1 capsule (7.5 mg total) by mouth at bedtime as needed for sleep. Patient not taking: Reported on 09/21/2014 01/14/14   [name], MD   BP 136/47 mmHg  Pulse 64  Temp(Src) 98.2 F (36.8 C) (Axillary)  Resp 17  SpO2 98% Physical Exam  Constitutional: He is oriented to person, place, and time. He appears well-developed and well-nourished. No distress.  HENT:  Head: Normocephalic.  Eyes: Conjunctivae are normal.  Neck: Normal range of motion.  Cardiovascular: Normal rate.   No murmur heard. Pulmonary/Chest: He has no wheezes. He has no rales.  The patient has intermittent gasping type breaths, but is observed to have interim normal effort deep breaths without gasping. Full air movement throughout without wheezing or rales.   Abdominal: Soft. He exhibits no distension. There is no tenderness.  Musculoskeletal: Normal range of motion. He exhibits no edema.   Neurological: He is alert and oriented to person, place, and time.  Skin: Skin is warm and dry.  Psychiatric: He has a normal mood and affect.    ED Course  Procedures (including critical care time) Labs Review Labs Reviewed  CBC WITH DIFFERENTIAL/PLATELET - Abnormal; Notable for the following:    RBC 3.66 (*)    Hemoglobin 10.8 (*)    HCT 31.7 (*)    All other components within normal limits  BASIC METABOLIC PANEL - Abnormal; Notable for the following:    Glucose, Bld 125 (*)    Creatinine, Ser 1.83 (*)    GFR calc non Af Amer 31 (*)    GFR calc Af Amer 36 (*)    All other components within normal limits  I-STAT CHEM 8, ED - Abnormal; Notable for the following:    BUN 25 (*)    Creatinine, Ser 1.70 (*)  Glucose, Bld 124 (*)    Hemoglobin 11.6 (*)    HCT 34.0 (*)    All other components within normal limits  PROTIME-INR  I-STAT CG4 LACTIC ACID, ED  Randolm Idol, ED    Imaging Review Dg Chest 2 View  09/25/2014   CLINICAL DATA:  40 minutes onset chest pain and shortness of breath. Increased confusion. Recent urinary tract infection.  EXAM: CHEST  2 VIEW  COMPARISON:  09/21/2014  FINDINGS: Mild cardiac enlargement without vascular congestion or edema. Interstitial changes in the lungs suggesting fibrosis. No focal airspace disease or consolidation. No blunting of costophrenic angles. No pneumothorax. Mediastinal contours appear intact. Calcification of the aorta. Degenerative changes in the spine and shoulders. Surgical clips in the right upper quadrant.  IMPRESSION: Cardiac enlargement. Slight fibrosis in the lungs. No evidence of active pulmonary disease.   Electronically Signed   By: Lucienne Capers M.D.   On: 09/25/2014 03:15     EKG Interpretation None      MDM   Final diagnoses:  Chest pain    1. Dyspnea  The patient's evaluation is essentially negative. Chart reviewed.   VQ scan low prob for PE on 09/22/14 CXR x 3 since 09/10/14 - all negative for acute  process Troponin x 3 since 09/10/14 all negative  The patient has outpatient follow up with Dr. Anitra Lauth. He reports he has never been referred to pulmonology for any reason. Labs today, including VBG, essentially normal. No hypoxia. He reports no significant improvement when placed on oxygen.   Discussed with Dr. Claudine Mouton who has seen the patient. Feel he is appropriate/safe for discharge home. He is encouraged to follow up with Dr. Anitra Lauth later today for further outpatient evaluation.  Charlann Lange, PA-C 09/25/14 3662  Everlene Balls, MD 09/27/14 2053

## 2014-09-28 DIAGNOSIS — R41841 Cognitive communication deficit: Secondary | ICD-10-CM | POA: Diagnosis not present

## 2014-09-28 DIAGNOSIS — R262 Difficulty in walking, not elsewhere classified: Secondary | ICD-10-CM | POA: Diagnosis not present

## 2014-09-28 DIAGNOSIS — M150 Primary generalized (osteo)arthritis: Secondary | ICD-10-CM | POA: Diagnosis not present

## 2014-09-28 DIAGNOSIS — M6281 Muscle weakness (generalized): Secondary | ICD-10-CM | POA: Diagnosis not present

## 2014-09-28 DIAGNOSIS — R279 Unspecified lack of coordination: Secondary | ICD-10-CM | POA: Diagnosis not present

## 2014-09-28 DIAGNOSIS — R488 Other symbolic dysfunctions: Secondary | ICD-10-CM | POA: Diagnosis not present

## 2014-10-01 ENCOUNTER — Ambulatory Visit (INDEPENDENT_AMBULATORY_CARE_PROVIDER_SITE_OTHER): Payer: Medicare Other | Admitting: Family Medicine

## 2014-10-01 ENCOUNTER — Encounter: Payer: Self-pay | Admitting: Family Medicine

## 2014-10-01 VITALS — BP 124/70 | HR 66 | Temp 98.0°F | Ht 65.0 in | Wt 149.0 lb

## 2014-10-01 DIAGNOSIS — R7989 Other specified abnormal findings of blood chemistry: Secondary | ICD-10-CM

## 2014-10-01 DIAGNOSIS — F1193 Opioid use, unspecified with withdrawal: Secondary | ICD-10-CM

## 2014-10-01 DIAGNOSIS — F1123 Opioid dependence with withdrawal: Secondary | ICD-10-CM

## 2014-10-01 DIAGNOSIS — N183 Chronic kidney disease, stage 3 unspecified: Secondary | ICD-10-CM

## 2014-10-01 NOTE — Progress Notes (Signed)
OFFICE NOTE  10/01/2014  CC:  Chief Complaint  Patient presents with  . Follow-up    2 week   HPI: Patient is a 81 y.o. Caucasian male who is here for 6d f/u panicky periods that we felt have been secondary to acute withdrawal from having been chronically on vicodin.  He went to the ED one more time since I saw him last, for same sx's as prior.  Eval showed him to have no acute problem.  He reports that since then he has felt well except for his chronic musculoskeletal pain. Last time I saw him I held his lasix due to rising creatinine.  His leg swelling is a bit increased since then but no SOB. Also started citalopram 10mg  last visit for his anxiety/depression/panic.  No side effects.  Pertinent PMH:  Past medical, surgical, social, and family history reviewed and no changes are noted since last office visit.  MEDS:  Outpatient Prescriptions Prior to Visit  Medication Sig Dispense Refill  . aspirin 81 MG chewable tablet Chew 81 mg by mouth daily.    Marland Kitchen atorvastatin (LIPITOR) 20 MG tablet Take 20 mg by mouth every evening.     . baclofen (LIORESAL) 10 MG tablet Take 10 mg by mouth 3 (three) times daily as needed for muscle spasms.     . betaxolol (BETOPTIC-S) 0.25 % ophthalmic suspension Place 1 drop into the right eye daily.    . citalopram (CELEXA) 10 MG tablet Take 1 tablet (10 mg total) by mouth daily. 30 tablet 6  . finasteride (PROSCAR) 5 MG tablet Take 1 tablet (5 mg total) by mouth daily. 30 tablet 6  . furosemide (LASIX) 40 MG tablet Take 40 mg by mouth daily.    . hydrALAZINE (APRESOLINE) 10 MG tablet Take 10 mg by mouth 3 (three) times daily.     Marland Kitchen HYDROcodone-acetaminophen (NORCO/VICODIN) 5-325 MG per tablet 1 tab twice per day by mouth 60 tablet 0  . levobunolol (BETAGAN) 0.5 % ophthalmic solution Place 1 drop into both eyes 2 (two) times daily.    . Menthol, Topical Analgesic, 4 % GEL Apply 1 application topically 3 (three) times daily. Applies to back    . tamsulosin  (FLOMAX) 0.4 MG CAPS capsule Take 2 capsules (0.8 mg total) by mouth daily after supper. 60 capsule 6  . temazepam (RESTORIL) 7.5 MG capsule Take 1 capsule (7.5 mg total) by mouth at bedtime as needed for sleep. 30 capsule 5   No facility-administered medications prior to visit.    PE: Blood pressure 124/70, pulse 66, temperature 98 F (36.7 C), temperature source Oral, height 5\' 5"  (1.651 m), weight 149 lb (67.586 kg), SpO2 96 %. Gen: Alert, well appearing.  Patient is oriented to person, place, time, and situation. AFFECT: pleasant, lucid thought and speech. Breathing appears normal, nonlabored.  He is showing no signs of anxiety today.  LABS:    Chemistry      Component Value Date/Time   NA 141 09/25/2014 0219   K 4.2 09/25/2014 0219   CL 104 09/25/2014 0219   CO2 24 09/25/2014 0211   BUN 25* 09/25/2014 0219   CREATININE 1.70* 09/25/2014 0219   CREATININE 1.44* 01/16/2014 1658      Component Value Date/Time   CALCIUM 8.9 09/25/2014 0211   ALKPHOS 78 02/19/2014 0909   AST 20 02/19/2014 0909   ALT 17 02/19/2014 0909   BILITOT 0.4 02/19/2014 0909       IMPRESSION AND PLAN:  1) Acute  opioid withdrawal, improving. Will cut vicodin dosing down to 1 tab qd x 7d then change this to PRN only. Will see how he's doing in 2 weeks.  2) Acute-on-chronic renal insufficincy: due to recent poor PO intake with ongoing lasix use. Lasix has been held the last week or so.  Recheck lytes/cr today, may restart lasix for LE edema after seeing results.  3) Anxiety/depression/panic: just started citalopram 10mg  qd one week ago and he is tolerating this well so far. Continue this med at current dose, consider increase if not feeling signif improved in 2 wks.  An After Visit Summary was printed and given to the patient.  FOLLOW UP: 2 wks

## 2014-10-01 NOTE — Progress Notes (Signed)
Pre visit review using our clinic review tool, if applicable. No additional management support is needed unless otherwise documented below in the visit note. 

## 2014-10-02 LAB — BASIC METABOLIC PANEL
BUN: 26 mg/dL — ABNORMAL HIGH (ref 6–23)
CHLORIDE: 103 meq/L (ref 96–112)
CO2: 30 meq/L (ref 19–32)
CREATININE: 1.11 mg/dL (ref 0.40–1.50)
Calcium: 9.1 mg/dL (ref 8.4–10.5)
GFR: 66.2 mL/min (ref >60.00)
Glucose, Bld: 115 mg/dL — ABNORMAL HIGH (ref 70–99)
Potassium: 5 mEq/L (ref 3.5–5.1)
Sodium: 135 mEq/L (ref 135–145)

## 2014-10-07 DIAGNOSIS — H3531 Nonexudative age-related macular degeneration: Secondary | ICD-10-CM | POA: Diagnosis not present

## 2014-10-14 ENCOUNTER — Telehealth: Payer: Self-pay | Admitting: Family Medicine

## 2014-10-14 DIAGNOSIS — F1123 Opioid dependence with withdrawal: Secondary | ICD-10-CM | POA: Diagnosis not present

## 2014-10-14 NOTE — Telephone Encounter (Signed)
Paged by answering service at 6:20 PM.  Felipa Eth at [facility] calling because they have a fax for hydrocodone/APAP 5/325 dated from 10/01/14 but it does not have quantity to dispense.  It was faxed to their facility today according to [facility] staff.  They request that Dr. Anitra Lauth office contact them in the morning 10/15/2014 regarding sending new prescription that has quantity of medication to be dispensed.

## 2014-10-14 NOTE — Telephone Encounter (Signed)
(  Noted. Pt on schedule 10/15/14 already. Anticipate need for new vicodin instructions after this visit anyway).  Lattie Haw, pls call [facility] and tell them we'll send new vicodin instructions or prescription after I see Lance Kemp today (10/15/14).-thx

## 2014-10-15 ENCOUNTER — Ambulatory Visit (INDEPENDENT_AMBULATORY_CARE_PROVIDER_SITE_OTHER): Payer: Medicare Other | Admitting: Family Medicine

## 2014-10-15 ENCOUNTER — Encounter: Payer: Self-pay | Admitting: Family Medicine

## 2014-10-15 VITALS — BP 136/67 | HR 68 | Temp 98.4°F | Resp 18 | Ht 65.0 in | Wt 153.0 lb

## 2014-10-15 DIAGNOSIS — R609 Edema, unspecified: Secondary | ICD-10-CM

## 2014-10-15 DIAGNOSIS — N401 Enlarged prostate with lower urinary tract symptoms: Secondary | ICD-10-CM

## 2014-10-15 DIAGNOSIS — M25512 Pain in left shoulder: Secondary | ICD-10-CM

## 2014-10-15 DIAGNOSIS — N3281 Overactive bladder: Secondary | ICD-10-CM | POA: Diagnosis not present

## 2014-10-15 DIAGNOSIS — I872 Venous insufficiency (chronic) (peripheral): Secondary | ICD-10-CM | POA: Diagnosis not present

## 2014-10-15 DIAGNOSIS — F418 Other specified anxiety disorders: Secondary | ICD-10-CM

## 2014-10-15 DIAGNOSIS — R351 Nocturia: Secondary | ICD-10-CM

## 2014-10-15 DIAGNOSIS — G8929 Other chronic pain: Secondary | ICD-10-CM

## 2014-10-15 DIAGNOSIS — F329 Major depressive disorder, single episode, unspecified: Secondary | ICD-10-CM

## 2014-10-15 DIAGNOSIS — F419 Anxiety disorder, unspecified: Secondary | ICD-10-CM

## 2014-10-15 MED ORDER — OXYBUTYNIN CHLORIDE 5 MG PO TABS: ORAL_TABLET | ORAL | Status: DC

## 2014-10-15 MED ORDER — HYDROCODONE-ACETAMINOPHEN 5-325 MG PO TABS
ORAL_TABLET | ORAL | Status: DC
Start: 2014-10-15 — End: 2016-03-14

## 2014-10-15 NOTE — Progress Notes (Signed)
Pre visit review using our clinic review tool, if applicable. No additional management support is needed unless otherwise documented below in the visit note. 

## 2014-10-15 NOTE — Telephone Encounter (Signed)
[facility] aware

## 2014-10-15 NOTE — Progress Notes (Signed)
OFFICE NOTE  10/15/2014  CC:  Chief Complaint  Patient presents with  . Follow-up     HPI: Patient is a 81 y.o. Caucasian male who is here for 2 wk f/u pain, relative failure to thrive, polypharmacy issues, CRI and LE edema.   Still tired from nocturia x 3 minimum.  No dysuria, no nausea, no fevers.  Still with frequency in daytime as well but not as bothersome as night-time.   Currently he is on only 40mg  lasix once a day.  He has had no more of his episodes of SOB and panic since I saw him last.  I think the citalopram is helping some with his mood/anxiety level.  He seems to be getting some of his whit back, a bit more gregarious like his old self--son reports this, too. He says his appetite is fair but the food at his NH is not to his liking so he has lost some wt and NH sent me a note saying he is now on their "watchlist" regarding this problem.  Son thinks (based on his Dads report of continued mildly increased somnolence lately) he is still getting the vicodin bid on scheduled basis instead of PRN as we had intended.       Pertinent PMH:  Past medical, surgical, social, and family history reviewed and no changes are noted since last office visit.  MEDS:  Outpatient Prescriptions Prior to Visit  Medication Sig Dispense Refill  . aspirin 81 MG chewable tablet Chew 81 mg by mouth daily.    Marland Kitchen atorvastatin (LIPITOR) 20 MG tablet Take 20 mg by mouth every evening.     . baclofen (LIORESAL) 10 MG tablet Take 10 mg by mouth 3 (three) times daily as needed for muscle spasms.     . citalopram (CELEXA) 10 MG tablet Take 1 tablet (10 mg total) by mouth daily. 30 tablet 6  . finasteride (PROSCAR) 5 MG tablet Take 1 tablet (5 mg total) by mouth daily. 30 tablet 6  . furosemide (LASIX) 40 MG tablet Take 40 mg by mouth daily.    . hydrALAZINE (APRESOLINE) 10 MG tablet Take 10 mg by mouth 3 (three) times daily.     Marland Kitchen HYDROcodone-acetaminophen (NORCO/VICODIN) 5-325 MG per tablet 1 tab twice  per day by mouth 60 tablet 0  . levobunolol (BETAGAN) 0.5 % ophthalmic solution Place 1 drop into both eyes 2 (two) times daily.    . tamsulosin (FLOMAX) 0.4 MG CAPS capsule Take 2 capsules (0.8 mg total) by mouth daily after supper. 60 capsule 6  . temazepam (RESTORIL) 7.5 MG capsule Take 1 capsule (7.5 mg total) by mouth at bedtime as needed for sleep. 30 capsule 5   No facility-administered medications prior to visit.    PE: Blood pressure 136/67, pulse 68, temperature 98.4 F (36.9 C), temperature source Temporal, resp. rate 18, height 5\' 5"  (1.651 m), weight 153 lb (69.4 kg), SpO2 93 %. Gen: Alert, well appearing.  Patient is oriented to person, place, time, and situation. AFFECT: pleasant, lucid thought and speech. EXT: 2+ pitting edema from knees down into feet bilat, no skin breakdown, no weeping.  He is wearing compression stockings as per his usual.  LABS:  Lab Results  Component Value Date   WBC 7.9 09/25/2014   HGB 11.6* 09/25/2014   HCT 34.0* 09/25/2014   MCV 86.6 09/25/2014   PLT 211 09/25/2014     Chemistry      Component Value Date/Time   NA 135 10/01/2014  1512   K 5.0 10/01/2014 1512   CL 103 10/01/2014 1512   CO2 30 10/01/2014 1512   BUN 26* 10/01/2014 1512   CREATININE 1.11 10/01/2014 1512   CREATININE 1.44* 01/16/2014 1658      Component Value Date/Time   CALCIUM 9.1 10/01/2014 1512   ALKPHOS 78 02/19/2014 0909   AST 20 02/19/2014 0909   ALT 17 02/19/2014 0909   BILITOT 0.4 02/19/2014 0909     IMPRESSION AND PLAN:  1) BPH with nocturia.  He may also have a component of overactive bladder playing a role: we decided to add a low dose of ditropan at bedtime (5mg ) and see how he does on this before asking urology to see him.  Continue flomax and proscar.  2) Chronic left shoulder pain and lumbago: does ok on low dose vicodin as long as it is not given on a scheduled basis. Will sent another order to NH to make sure he is being given this med [place] it.    3) Chronic LE venous insufficiency edema: stable.  Continue current lasix dosing. No labs today.  4) Anxiety and depression: largely situational (medical problems, frustration with NH living, loneliness of aging, etc). He is improved on citalopram 10mg  qd for the last 1 mo and I'll continue him on this dose for now.  An After Visit Summary was printed and given to the patient.  FOLLOW UP: 2 wks

## 2014-10-16 ENCOUNTER — Encounter: Payer: Self-pay | Admitting: Neurology

## 2014-10-17 DIAGNOSIS — N3281 Overactive bladder: Secondary | ICD-10-CM | POA: Insufficient documentation

## 2014-10-29 ENCOUNTER — Ambulatory Visit (INDEPENDENT_AMBULATORY_CARE_PROVIDER_SITE_OTHER): Payer: Medicare Other | Admitting: Family Medicine

## 2014-10-29 ENCOUNTER — Encounter: Payer: Self-pay | Admitting: Family Medicine

## 2014-10-29 VITALS — BP 133/74 | HR 59 | Temp 97.5°F | Resp 16 | Wt 154.0 lb

## 2014-10-29 DIAGNOSIS — N3281 Overactive bladder: Secondary | ICD-10-CM

## 2014-10-29 DIAGNOSIS — R631 Polydipsia: Secondary | ICD-10-CM

## 2014-10-29 DIAGNOSIS — F329 Major depressive disorder, single episode, unspecified: Secondary | ICD-10-CM

## 2014-10-29 DIAGNOSIS — F32A Depression, unspecified: Secondary | ICD-10-CM

## 2014-10-29 DIAGNOSIS — I872 Venous insufficiency (chronic) (peripheral): Secondary | ICD-10-CM | POA: Diagnosis not present

## 2014-10-29 LAB — GLUCOSE, POCT (MANUAL RESULT ENTRY): POC Glucose: 127 mg/dl — AB (ref 70–99)

## 2014-10-29 MED ORDER — TOLTERODINE TARTRATE ER 2 MG PO CP24: ORAL_CAPSULE | ORAL | Status: DC

## 2014-10-29 NOTE — Progress Notes (Signed)
Pre visit review using our clinic review tool, if applicable. No additional management support is needed unless otherwise documented below in the visit note. 

## 2014-10-29 NOTE — Progress Notes (Signed)
OFFICE NOTE  10/29/2014  CC:  Chief Complaint  Patient presents with  . Follow-up    2 week follow up nocturia   HPI: Patient is a 81 y.o. Caucasian male who is here for 2 wk f/u overactive bladder + BPH sx's. Started 5mg  ditropan hs last visit.  Says he still wakes up around 2-3 AM to urinate and cannot go back to sleep after that.  Complains of excessive thirst but with further questioning it sounds like this is simply dry mouth.  He also reveals today that he is drinking 1 cup of regular coffee with each meal of the day.  He is eating better than last time I saw him, also more alert and happy lately--it seems apparent that he is no longer getting the vicodin on a scheduled basis and therefore is feeling back to normal mentally/cognitively/emotionally.  Last ate a banana and ensure about 4 and 1/2 hours ago.  Pertinent PMH:  Past medical, surgical, social, and family history reviewed and no changes are noted since last office visit.  MEDS:  Outpatient Prescriptions Prior to Visit  Medication Sig Dispense Refill  . aspirin 81 MG chewable tablet Chew 81 mg by mouth daily.    Marland Kitchen atorvastatin (LIPITOR) 20 MG tablet Take 20 mg by mouth every evening.     Marland Kitchen azelastine (OPTIVAR) 0.05 % ophthalmic solution Place 2 drops into both eyes 2 (two) times daily.    . baclofen (LIORESAL) 10 MG tablet Take 10 mg by mouth 3 (three) times daily as needed for muscle spasms.     . citalopram (CELEXA) 10 MG tablet Take 1 tablet (10 mg total) by mouth daily. 30 tablet 6  . finasteride (PROSCAR) 5 MG tablet Take 1 tablet (5 mg total) by mouth daily. 30 tablet 6  . furosemide (LASIX) 40 MG tablet Take 40 mg by mouth daily.    . hydrALAZINE (APRESOLINE) 10 MG tablet Take 10 mg by mouth 3 (three) times daily.     Marland Kitchen HYDROcodone-acetaminophen (NORCO/VICODIN) 5-325 MG per tablet 1 tab by mouth every twelve hours to be given only WHEN REQUESTED BY PATIENT. 60 tablet 0  . levobunolol (BETAGAN) 0.5 % ophthalmic  solution Place 1 drop into both eyes 2 (two) times daily.    Marland Kitchen oxybutynin (DITROPAN) 5 MG tablet 1 tab po qhs 30 tablet 1  . tamsulosin (FLOMAX) 0.4 MG CAPS capsule Take 2 capsules (0.8 mg total) by mouth daily after supper. 60 capsule 6  . temazepam (RESTORIL) 7.5 MG capsule Take 1 capsule (7.5 mg total) by mouth at bedtime as needed for sleep. 30 capsule 5   No facility-administered medications prior to visit.    PE: Blood pressure 133/74, pulse 59, temperature 97.5 F (36.4 C), temperature source Oral, resp. rate 16, weight 154 lb (69.854 kg), SpO2 93 %. Gen: Alert, well appearing.  Patient is oriented to person, place, time, and situation. CV: RRR, distant S1 and S2, w/out m/r/g LUNGS: very soft, early insp crackles in both bases, good aeration.  These crackles nearly completely disappear after 2-3 deep breaths.  Nonlabored resps. EXT: 2-3+ pitting edema below the knees bilat, compression hose on both LLs.  No sign of skin breakdown.  POCT glucose today: 127   LAB:   Chemistry      Component Value Date/Time   NA 135 10/01/2014 1512   K 5.0 10/01/2014 1512   CL 103 10/01/2014 1512   CO2 30 10/01/2014 1512   BUN 26* 10/01/2014 1512  CREATININE 1.11 10/01/2014 1512   CREATININE 1.44* 01/16/2014 1658      Component Value Date/Time   CALCIUM 9.1 10/01/2014 1512   ALKPHOS 78 02/19/2014 0909   AST 20 02/19/2014 0909   ALT 17 02/19/2014 0909   BILITOT 0.4 02/19/2014 0909       IMPRESSION AND PLAN:  1) OAB: ditropan 5mg  hs not helpful.   D/c this med and start detrol LA 2mg  qhs. Stop drinking regular coffee with supper meal.  2) Depression; improving since getting on citalopram and since no longer getting vicodin q6h. He'll continue citalopram  10mg  qd and he has the vicodin available for pain treatment when he requests it.  3) LE venous insufficiency edema: stable.  Renal function has been stable. Continue lasix 40mg  qd, compression hose, elevation of legs, low Na diet  (this is tough at an assisted living facility).  4) Polydipsia: glucose normal today for random/nonfasting state.  Suspect he has dry mouth chronically as a side effect from several of his meds.  An After Visit Summary was printed and given to the patient.  FOLLOW UP: has appt set for 11/26/14 already.

## 2014-11-02 ENCOUNTER — Telehealth: Payer: Self-pay | Admitting: Family Medicine

## 2014-11-02 NOTE — Telephone Encounter (Signed)
OK, just call [facility] back and make sure he feels ok with this blood pressure. Pls ask them to report how he feels (dizzy?, weak?) when they call to report a bp out of range.-thx

## 2014-11-02 NOTE — Telephone Encounter (Signed)
[facility] home health nurse called to report that Mr. Manas BP this am was 100/50. She said it was out of parameter so she needed to report it.

## 2014-11-02 NOTE — Telephone Encounter (Signed)
Noted  

## 2014-11-02 NOTE — Telephone Encounter (Signed)
Spoke to [employer] pt nurse and she stated that pt did not complain about being dizzy or weak. She stated that he only complained about having pain in his back in shoulder which she said is normal for him. She did mention that he weigh 3lb less today then he did yesterday but then stated that she wasn't sure if he weight machine was working properly. She stated over all he seemed his self.

## 2014-11-11 ENCOUNTER — Encounter: Payer: Self-pay | Admitting: Family Medicine

## 2014-11-24 ENCOUNTER — Encounter: Payer: Self-pay | Admitting: Neurology

## 2014-11-24 ENCOUNTER — Ambulatory Visit (INDEPENDENT_AMBULATORY_CARE_PROVIDER_SITE_OTHER): Payer: Medicare Other | Admitting: Neurology

## 2014-11-24 VITALS — BP 120/64 | HR 64 | Resp 16 | Ht 64.0 in | Wt 154.0 lb

## 2014-11-24 DIAGNOSIS — F03A0 Unspecified dementia, mild, without behavioral disturbance, psychotic disturbance, mood disturbance, and anxiety: Secondary | ICD-10-CM | POA: Insufficient documentation

## 2014-11-24 DIAGNOSIS — F0390 Unspecified dementia without behavioral disturbance: Secondary | ICD-10-CM | POA: Diagnosis not present

## 2014-11-24 NOTE — Patient Instructions (Signed)
1. Continue to monitor your symptoms, call our office for any changes 2. Follow-up in 6 months

## 2014-11-24 NOTE — Progress Notes (Signed)
NEUROLOGY FOLLOW UP OFFICE NOTE  Lance Kemp [id number]  HISTORY OF PRESENT ILLNESS: I had the pleasure of seeing Lance Kemp in follow-up in the neurology clinic on 11/24/2014.  The patient was last seen 3 months ago for worsening memory, likely mild dementia. MMSE at that time was 23/30. He is again accompanied by his son who helps supplement the history today.  Records and images were personally reviewed where available.  I personally reviewed MRI brain without contrast done 08/2014 which showed prominence of the ventricular system, slightly out of proportion to the degree of diffuse atrophy; moderate to severe chronic microvascular disease, no acute changes. He does not have any symptoms of normal pressure hydrocephalus, he denies any urinary incontinence, no magnetic gait. He denies any falls. He ambulates with a walker. He has infrequent headaches, one episode of dizziness upon standing, shoulder pains, otherwise no vision changes, diplopia, dysarthria, neck/back pain. He feels his memory is bad, his son denies any changes since his last visit. He was started on Aricept which caused significant diarrhea that resolved after this was discontinued.  HPI: This is a pleasant 81 yo RH man with a history of hypertension, hyperlipidemia, chronic shoulder pain, with memory loss that became noticeable when he moved to [place] in 2014. He feels his memory is "not too good." He cites instances where he is introduced to someone, then he walks 15 feet away and forgets their name. He misplaces things frequently. He has had some word-finding difficulties. He had been living in [place] by himself, with his son visiting him annually except in 2013. On his visit in 2012, his father was doing absolutely fine. He had a TIA in 2013 when he fell, and when his son came next in August 2014, he noticed a huge difference. His doctor told him not to drive after the TIA in 2013. He denied getting lost driving, and denied any  missed bills or medications. He moved to [place] to be closer to his son in September 2014. His son has noticed several things, his short term memory is not good, he cannot recall what he ate prior, or their discussion the day prior, appointments, the day of the week. He seems confused on place and time, and would refer to going down the hallway at his facility as "going downstairs" or the lobby as the "living room" or "family room." His long-term memory is wonderful. His speech is fine, but he has a hard time finding the correct words to describe a location. He does not focus on a discussion, and can drift off easily when distracted. He needs help with bathing but can dress himself without difficulty.   PAST MEDICAL HISTORY: Past Medical History  Diagnosis Date  . Osteoarthritis     hands/wrists  . Depression     antidepressant x 1 yr--situational. Resolved 08/2013 per pt/son.  . Glaucoma   . Hypertension   . 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  No significant residual deficit except short term memory impairment  . Urine incontinence   . Frequent headaches   . Chronic venous insufficiency     compression hose  . Peripheral neuropathy 2013    Dx'd by neuro in [place] at the time the patient was hospitalized briefly for [place] CVA.  . NSTEMI (non-ST elevated myocardial infarction) 06/2011    [place]: Troponin mildly elevated, normal EKG, normal ECHO, pt declined cardiology consult--per old PCP records.  . Basal cell carcinoma 2011    Face  .  Chronic renal insufficiency, stage III (moderate) 2013    CrCl in the 50s  . Chronic diastolic heart failure   . Chronic left shoulder pain 09/14/2013  . Normocytic anemia 2015    secondary to CRI (iron studies ok 12/2013)  . Shortness of breath   . Chronic back pain   . Dementia 2016    Dr. Delice Lesch 08/2014--started aricept    MEDICATIONS: Current Outpatient Prescriptions on File Prior to Visit  Medication Sig Dispense Refill  .  aspirin 81 MG chewable tablet Chew 81 mg by mouth daily.    Marland Kitchen atorvastatin (LIPITOR) 20 MG tablet Take 20 mg by mouth every evening.     Marland Kitchen azelastine (OPTIVAR) 0.05 % ophthalmic solution Place 2 drops into both eyes 2 (two) times daily.    . baclofen (LIORESAL) 10 MG tablet Take 10 mg by mouth 3 (three) times daily as needed for muscle spasms.     . citalopram (CELEXA) 10 MG tablet Take 1 tablet (10 mg total) by mouth daily. 30 tablet 6  . finasteride (PROSCAR) 5 MG tablet Take 1 tablet (5 mg total) by mouth daily. 30 tablet 6  . furosemide (LASIX) 40 MG tablet Take 40 mg by mouth daily.    . hydrALAZINE (APRESOLINE) 10 MG tablet Take 10 mg by mouth 3 (three) times daily.     Marland Kitchen HYDROcodone-acetaminophen (NORCO/VICODIN) 5-325 MG per tablet 1 tab by mouth every twelve hours to be given only WHEN REQUESTED BY PATIENT. 60 tablet 0  . levobunolol (BETAGAN) 0.5 % ophthalmic solution Place 1 drop into both eyes 2 (two) times daily.    . tamsulosin (FLOMAX) 0.4 MG CAPS capsule Take 2 capsules (0.8 mg total) by mouth daily after supper. 60 capsule 6  . temazepam (RESTORIL) 7.5 MG capsule Take 1 capsule (7.5 mg total) by mouth at bedtime as needed for sleep. 30 capsule 5  . tolterodine (DETROL LA) 2 MG 24 hr capsule 1 cap po qhs 30 capsule 1   No current facility-administered medications on file prior to visit.    ALLERGIES: Allergies  Allergen Reactions  . Altace [Ramipril] Other (See Comments)    As per old records from [place]  . Amoxicillin Other (See Comments)    Per old records from [place]  . Cozaar [Losartan Potassium] Other (See Comments)    As per old records from [place]: Family History  Problem Relation Age of Onset  . Cancer Mother   . Heart disease Mother     SOCIAL HISTORY: History   Social History  . Marital Status: Single    Spouse Name: N/A  . Number of Children: 1  . Years of Education: N/A   Occupational History  . Retired    Social History  Main Topics  . Smoking status: Former Smoker    Types: Pipe  . Smokeless tobacco: Never Used  . Alcohol Use: No  . Drug Use: No  . Sexual Activity: No   Other Topics Concern  . Not on file   Social History Narrative   Widower.   Relocated to [place] from [place] 02/2013 to live in [place] (falls while living alone led to this).   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   Pipe smoker until 2014.   Alcohol: social drinker until his 55s, then no alcohol.          REVIEW OF SYSTEMS: Constitutional: No fevers, chills, or sweats, no generalized fatigue, change in appetite Eyes:  No visual changes, double vision, eye pain Ear, nose and throat: No hearing loss, ear pain, nasal congestion, sore throat Cardiovascular: No chest pain, palpitations Respiratory:  No shortness of breath at rest or with exertion, wheezes GastrointestinaI: No nausea, vomiting, diarrhea, abdominal pain, fecal incontinence Genitourinary:  No dysuria, urinary retention or frequency Musculoskeletal:  No neck pain, back pain. + shoulder pain Integumentary: No rash, pruritus, skin lesions Neurological: as above Psychiatric: No depression, insomnia, anxiety Endocrine: No palpitations, fatigue, diaphoresis, mood swings, change in appetite, change in weight, increased thirst Hematologic/Lymphatic:  No anemia, purpura, petechiae. Allergic/Immunologic: no itchy/runny eyes, nasal congestion, recent allergic reactions, rashes  PHYSICAL EXAM: Filed Vitals:   11/24/14 1454  BP: 120/64  Pulse: 64  Resp: 16   General: No acute distress Head:  Normocephalic/atraumatic Neck: supple, no paraspinal tenderness, full range of motion Heart:  Regular rate and rhythm Lungs:  Clear to auscultation bilaterally Back: No paraspinal tenderness Skin/Extremities: No rash, no edema, he is wearing compression stockings Neurological Exam: alert and oriented to person, place, and time. No aphasia or dysarthria. Fund of  knowledge is appropriate.  Recent and remote memory are intact. 2/3 delayed recall.  Attention and concentration are normal.    Able to name objects and repeat phrases. Cranial nerves: Pupils equal, round, reactive to light.  Fundoscopic exam unremarkable, no papilledema. Extraocular movements intact with no nystagmus. Visual fields full. Facial sensation intact. No facial asymmetry. Tongue, uvula, palate midline.  Motor: Bulk and tone normal, muscle strength 5/5 throughout with no pronator drift.  Sensation to light touch intact.  No extinction to double simultaneous stimulation.  Deep tendon reflexes 1+ throughout except for absent ankle jerks bilaterally, toes downgoing.  Finger to nose testing intact.  Gait slow and cautious with walker, no magnetic gait noted, no ataxia. Romberg negative.  IMPRESSION: This is a pleasant 81 yo RH man with a history of hypertension, hyperlipidemia, chronic shoulder pain, with worsening memory and slight behavioral changes since 2014. MMSE in March 2016 was 23/30, likely mild dementia. MRI brain showed prominence of ventricles but no other clinical signs of normal pressure hydrocephalus. He did not tolerate Aricept. We discussed the option of starting Exelon patch, however Mr. Boursiquot would like to hold off on starting anything right now. We discussed his MRI findings, and continued control of vascular risk factors. Continue control of vascular risk factors, physical exercise and brain stimulation exercises for brain health. He will follow-up in 6 months or earlier if needed.   Thank you for allowing me to participate in his care.  Please do not hesitate to call for any questions or concerns.  The duration of this appointment visit was 15 minutes of face-to-face time with the patient.  Greater than 50% of this time was spent in counseling, explanation of diagnosis, planning of further management, and coordination of care.   Ellouise Newer, M.D.   CC: Dr.  Anitra Lauth

## 2014-11-26 ENCOUNTER — Encounter: Payer: Self-pay | Admitting: Family Medicine

## 2014-11-26 ENCOUNTER — Ambulatory Visit (INDEPENDENT_AMBULATORY_CARE_PROVIDER_SITE_OTHER): Payer: Medicare Other | Admitting: Family Medicine

## 2014-11-26 VITALS — BP 136/68 | HR 65 | Temp 97.3°F | Resp 16 | Wt 153.0 lb

## 2014-11-26 DIAGNOSIS — N3281 Overactive bladder: Secondary | ICD-10-CM

## 2014-11-26 DIAGNOSIS — N401 Enlarged prostate with lower urinary tract symptoms: Secondary | ICD-10-CM | POA: Diagnosis not present

## 2014-11-26 DIAGNOSIS — N183 Chronic kidney disease, stage 3 unspecified: Secondary | ICD-10-CM

## 2014-11-26 DIAGNOSIS — R351 Nocturia: Secondary | ICD-10-CM

## 2014-11-26 DIAGNOSIS — I872 Venous insufficiency (chronic) (peripheral): Secondary | ICD-10-CM | POA: Diagnosis not present

## 2014-11-26 DIAGNOSIS — N189 Chronic kidney disease, unspecified: Secondary | ICD-10-CM

## 2014-11-26 NOTE — Progress Notes (Signed)
Pre visit review using our clinic review tool, if applicable. No additional management support is needed unless otherwise documented below in the visit note. 

## 2014-11-26 NOTE — Progress Notes (Signed)
OFFICE NOTE  12/13/2014  CC:  Chief Complaint  Patient presents with  . Follow-up    3 week f/u. Pt is not fasting.   HPI: Patient is a 81 y.o. Caucasian male who is here for 1 mo f/u BPH + OAB + LE venous insufficiency. Detrol LA trial (2mg ) started last visit and has been trying to drink decaf coffee: he notes SIGNIFICANT improvement, only has to get up 1 time per night and gets back to sleep fine.  Sleep/rest is much improved!  LE swelling is stable, he is wearing compression stockings faithfully.  Takes lasix 40mg  qd.   Pertinent PMH:  Past medical, surgical, social, and family history reviewed and no changes are noted since last office visit.  MEDS:  Outpatient Prescriptions Prior to Visit  Medication Sig Dispense Refill  . aspirin 81 MG chewable tablet Chew 81 mg by mouth daily.    Marland Kitchen atorvastatin (LIPITOR) 20 MG tablet Take 20 mg by mouth every evening.     Marland Kitchen azelastine (OPTIVAR) 0.05 % ophthalmic solution Place 2 drops into both eyes 2 (two) times daily.    . baclofen (LIORESAL) 10 MG tablet Take 10 mg by mouth 3 (three) times daily as needed for muscle spasms.     . citalopram (CELEXA) 10 MG tablet Take 1 tablet (10 mg total) by mouth daily. 30 tablet 6  . finasteride (PROSCAR) 5 MG tablet Take 1 tablet (5 mg total) by mouth daily. 30 tablet 6  . furosemide (LASIX) 40 MG tablet Take 40 mg by mouth daily.    . hydrALAZINE (APRESOLINE) 10 MG tablet Take 10 mg by mouth 3 (three) times daily.     Marland Kitchen HYDROcodone-acetaminophen (NORCO/VICODIN) 5-325 MG per tablet 1 tab by mouth every twelve hours to be given only WHEN REQUESTED BY PATIENT. 60 tablet 0  . levobunolol (BETAGAN) 0.5 % ophthalmic solution Place 1 drop into both eyes 2 (two) times daily.    . tamsulosin (FLOMAX) 0.4 MG CAPS capsule Take 2 capsules (0.8 mg total) by mouth daily after supper. 60 capsule 6  . temazepam (RESTORIL) 7.5 MG capsule Take 1 capsule (7.5 mg total) by mouth at bedtime as needed for sleep. 30 capsule  5  . tolterodine (DETROL LA) 2 MG 24 hr capsule 1 cap po qhs 30 capsule 1   No facility-administered medications prior to visit.    PE: Blood pressure 136/68, pulse 65, temperature 97.3 F (36.3 C), temperature source Oral, resp. rate 16, weight 153 lb (69.4 kg), SpO2 96 %. Gen: Alert, well appearing.  Patient is oriented to person, place, time, and situation. JQB:HALP: no injection, icteris, swelling, or exudate.  EOMI, PERRLA. Mouth: lips without lesion/swelling.  Oral mucosa pink and moist. Oropharynx without erythema, exudate, or swelling.  CV: RRR LUNGS: CTA bilat, nonlabored resps EXT: 2+ pitting edema bilat from knees down into feet.  No skin breakdown.  Compression hose in place.  LABS:   Chemistry      Component Value Date/Time   NA 138 11/26/2014 1611   K 4.4 11/26/2014 1611   CL 104 11/26/2014 1611   CO2 29 11/26/2014 1611   BUN 24* 11/26/2014 1611   CREATININE 1.13 11/26/2014 1611   CREATININE 1.44* 01/16/2014 1658      Component Value Date/Time   CALCIUM 8.8 11/26/2014 1611   ALKPHOS 78 02/19/2014 0909   AST 20 02/19/2014 0909   ALT 17 02/19/2014 0909   BILITOT 0.4 02/19/2014 0909     IMPRESSION AND  PLAN:  1) BPH+ OAB: improved with recent addition of detrol LA 2mg  qd.  He also cut way back on caffeine intake.  2) LE venous insufficiency edema: stable on exam/wt stable. The current medical regimen is effective;  continue present plan and medications. BMET today.  An After Visit Summary was printed and given to the patient.  FOLLOW UP: 3 mo

## 2014-11-27 ENCOUNTER — Telehealth: Payer: Self-pay | Admitting: Family Medicine

## 2014-11-27 LAB — BASIC METABOLIC PANEL
BUN: 24 mg/dL — ABNORMAL HIGH (ref 6–23)
CHLORIDE: 104 meq/L (ref 96–112)
CO2: 29 mEq/L (ref 19–32)
Calcium: 8.8 mg/dL (ref 8.4–10.5)
Creatinine, Ser: 1.13 mg/dL (ref 0.40–1.50)
GFR: 64.83 mL/min (ref >60.00)
Glucose, Bld: 152 mg/dL — ABNORMAL HIGH (ref 70–99)
Potassium: 4.4 mEq/L (ref 3.5–5.1)
Sodium: 138 mEq/L (ref 135–145)

## 2014-12-18 ENCOUNTER — Telehealth: Payer: Self-pay | Admitting: Family Medicine

## 2014-12-18 ENCOUNTER — Ambulatory Visit (INDEPENDENT_AMBULATORY_CARE_PROVIDER_SITE_OTHER): Payer: Medicare Other | Admitting: Podiatry

## 2014-12-18 DIAGNOSIS — M79675 Pain in left toe(s): Secondary | ICD-10-CM

## 2014-12-18 DIAGNOSIS — M79676 Pain in unspecified toe(s): Secondary | ICD-10-CM | POA: Diagnosis not present

## 2014-12-18 DIAGNOSIS — M79674 Pain in right toe(s): Secondary | ICD-10-CM

## 2014-12-18 DIAGNOSIS — B351 Tinea unguium: Secondary | ICD-10-CM

## 2014-12-18 NOTE — Telephone Encounter (Signed)
Lance Kemp is at his fathers assisted living home and says Harlene Salts has a bad presistant cough. The facility will not give [place] cough medicine w/o a fax from Dr. Anitra Lauth stating it is ok to give him cough medicine.

## 2014-12-18 NOTE — Progress Notes (Signed)
Patient ID: Lance Kemp, male   DOB: [birth year], 81 y.o.   MRN: [id number] Complaint:  Visit Type: Patient returns to my office for continued preventative foot care services. Complaint: Patient states" my nails have grown long and thick and become painful to walk and wear shoes" Patient has been diagnosed with DM with no complications. He presents for preventative foot care services. No changes to ROS  Podiatric Exam: Vascular: dorsalis pedis and posterior tibial pulses are palpable bilateral. Capillary return is immediate. Temperature gradient is WNL. Skin turgor WNL  Sensorium: Normal Semmes Weinstein monofilament test. Normal tactile sensation bilaterally. Nail Exam: Pt has thick disfigured discolored nails with subungual debris noted bilateral entire nail hallux through fifth toenails Ulcer Exam: There is no evidence of ulcer or pre-ulcerative changes or infection. Orthopedic Exam: Muscle tone and strength are WNL. No limitations in general ROM. No crepitus or effusions noted. Foot type and digits show no abnormalities. Bony prominences are unremarkable. Skin: No Porokeratosis. No infection or ulcers  Diagnosis:  Tinea unguium, Pain in right toe, pain in left toes  Treatment & Plan Procedures and Treatment: Consent by patient was obtained for treatment procedures. The patient understood the discussion of treatment and procedures well. All questions were answered thoroughly reviewed. Debridement of mycotic and hypertrophic toenails, 1 through 5 bilateral and clearing of subungual debris. No ulceration, no infection noted.  Return Visit-Office Procedure: Patient instructed to return to the office for a follow up visit 3 months for continued evaluation and treatment.

## 2014-12-18 NOTE — Progress Notes (Signed)
Patient ID: Lance Kemp, male   DOB: [birth year], 81 y.o.   MRN: [id number] I need my toe nails trimmed I can't do it any more

## 2014-12-18 NOTE — Telephone Encounter (Signed)
Order written. Faxed by CMA Sharen Hones to pt's ALF.

## 2014-12-21 ENCOUNTER — Emergency Department (HOSPITAL_COMMUNITY): Payer: Medicare Other

## 2014-12-21 ENCOUNTER — Encounter (HOSPITAL_COMMUNITY): Payer: Self-pay | Admitting: Emergency Medicine

## 2014-12-21 ENCOUNTER — Emergency Department (HOSPITAL_COMMUNITY)
Admission: EM | Admit: 2014-12-21 | Discharge: 2014-12-21 | Disposition: A | Discharge status: Home / Self Care | Payer: Medicare Other | Attending: Emergency Medicine | Admitting: Emergency Medicine

## 2014-12-21 DIAGNOSIS — H409 Unspecified glaucoma: Secondary | ICD-10-CM | POA: Diagnosis not present

## 2014-12-21 DIAGNOSIS — F329 Major depressive disorder, single episode, unspecified: Secondary | ICD-10-CM | POA: Insufficient documentation

## 2014-12-21 DIAGNOSIS — Z85828 Personal history of other malignant neoplasm of skin: Secondary | ICD-10-CM | POA: Diagnosis not present

## 2014-12-21 DIAGNOSIS — R519 Headache, unspecified: Secondary | ICD-10-CM

## 2014-12-21 DIAGNOSIS — Y929 Unspecified place or not applicable: Secondary | ICD-10-CM | POA: Diagnosis not present

## 2014-12-21 DIAGNOSIS — W19XXXA Unspecified fall, initial encounter: Secondary | ICD-10-CM

## 2014-12-21 DIAGNOSIS — M1990 Unspecified osteoarthritis, unspecified site: Secondary | ICD-10-CM | POA: Diagnosis not present

## 2014-12-21 DIAGNOSIS — S0990XA Unspecified injury of head, initial encounter: Secondary | ICD-10-CM | POA: Diagnosis not present

## 2014-12-21 DIAGNOSIS — S199XXA Unspecified injury of neck, initial encounter: Secondary | ICD-10-CM | POA: Insufficient documentation

## 2014-12-21 DIAGNOSIS — S79912A Unspecified injury of left hip, initial encounter: Secondary | ICD-10-CM | POA: Diagnosis not present

## 2014-12-21 DIAGNOSIS — M542 Cervicalgia: Secondary | ICD-10-CM | POA: Diagnosis not present

## 2014-12-21 DIAGNOSIS — I129 Hypertensive chronic kidney disease with stage 1 through stage 4 chronic kidney disease, or unspecified chronic kidney disease: Secondary | ICD-10-CM | POA: Insufficient documentation

## 2014-12-21 DIAGNOSIS — S3992XA Unspecified injury of lower back, initial encounter: Secondary | ICD-10-CM | POA: Diagnosis not present

## 2014-12-21 DIAGNOSIS — T148 Other injury of unspecified body region: Secondary | ICD-10-CM | POA: Diagnosis not present

## 2014-12-21 DIAGNOSIS — I252 Old myocardial infarction: Secondary | ICD-10-CM | POA: Insufficient documentation

## 2014-12-21 DIAGNOSIS — S79919A Unspecified injury of unspecified hip, initial encounter: Secondary | ICD-10-CM | POA: Insufficient documentation

## 2014-12-21 DIAGNOSIS — Z79899 Other long term (current) drug therapy: Secondary | ICD-10-CM | POA: Diagnosis not present

## 2014-12-21 DIAGNOSIS — I5032 Chronic diastolic (congestive) heart failure: Secondary | ICD-10-CM | POA: Diagnosis not present

## 2014-12-21 DIAGNOSIS — Z87891 Personal history of nicotine dependence: Secondary | ICD-10-CM | POA: Insufficient documentation

## 2014-12-21 DIAGNOSIS — R05 Cough: Secondary | ICD-10-CM | POA: Diagnosis not present

## 2014-12-21 DIAGNOSIS — M25552 Pain in left hip: Secondary | ICD-10-CM | POA: Diagnosis not present

## 2014-12-21 DIAGNOSIS — Z862 Personal history of diseases of the blood and blood-forming organs and certain disorders involving the immune mechanism: Secondary | ICD-10-CM | POA: Insufficient documentation

## 2014-12-21 DIAGNOSIS — Y939 Activity, unspecified: Secondary | ICD-10-CM | POA: Diagnosis not present

## 2014-12-21 DIAGNOSIS — N183 Chronic kidney disease, stage 3 (moderate): Secondary | ICD-10-CM | POA: Diagnosis not present

## 2014-12-21 DIAGNOSIS — Z7982 Long term (current) use of aspirin: Secondary | ICD-10-CM | POA: Diagnosis not present

## 2014-12-21 DIAGNOSIS — R0602 Shortness of breath: Secondary | ICD-10-CM | POA: Insufficient documentation

## 2014-12-21 DIAGNOSIS — M25551 Pain in right hip: Secondary | ICD-10-CM | POA: Diagnosis not present

## 2014-12-21 DIAGNOSIS — R51 Headache: Secondary | ICD-10-CM

## 2014-12-21 DIAGNOSIS — Z880 Allergy status to penicillin: Secondary | ICD-10-CM | POA: Diagnosis not present

## 2014-12-21 DIAGNOSIS — W06XXXA Fall from bed, initial encounter: Secondary | ICD-10-CM | POA: Diagnosis not present

## 2014-12-21 DIAGNOSIS — M546 Pain in thoracic spine: Secondary | ICD-10-CM

## 2014-12-21 DIAGNOSIS — S299XXA Unspecified injury of thorax, initial encounter: Secondary | ICD-10-CM | POA: Diagnosis not present

## 2014-12-21 DIAGNOSIS — Y999 Unspecified external cause status: Secondary | ICD-10-CM | POA: Insufficient documentation

## 2014-12-21 DIAGNOSIS — Z8673 Personal history of transient ischemic attack (TIA), and cerebral infarction without residual deficits: Secondary | ICD-10-CM | POA: Diagnosis not present

## 2014-12-21 DIAGNOSIS — M549 Dorsalgia, unspecified: Secondary | ICD-10-CM | POA: Diagnosis not present

## 2014-12-21 DIAGNOSIS — M25559 Pain in unspecified hip: Secondary | ICD-10-CM

## 2014-12-21 DIAGNOSIS — S79911A Unspecified injury of right hip, initial encounter: Secondary | ICD-10-CM | POA: Diagnosis not present

## 2014-12-21 DIAGNOSIS — F0390 Unspecified dementia without behavioral disturbance: Secondary | ICD-10-CM | POA: Insufficient documentation

## 2014-12-21 DIAGNOSIS — G8929 Other chronic pain: Secondary | ICD-10-CM | POA: Diagnosis not present

## 2014-12-21 MED ORDER — ACETAMINOPHEN 325 MG PO TABS
650.0000 mg | ORAL_TABLET | Freq: Once | ORAL | Status: AC
  Administered 2014-12-21: 650 mg via ORAL
  Filled 2014-12-21: qty 2

## 2014-12-21 NOTE — ED Notes (Addendum)
Pt from [place] at [facility] via EMS had an unwitnessed fall out of bed- about 1 foot. He states he did not have a LOC and he states he did not hit his head, however when his CNA found him his head was propped against his bedside table.  He has c/o back and shoulder pain 8/10 Pt has had a cough x 1 week and EMS reports he has increased anxiety.

## 2014-12-21 NOTE — ED Notes (Signed)
Bed: WA04 Expected date:  Expected time:  Means of arrival:  Comments: EMS 

## 2014-12-21 NOTE — ED Notes (Signed)
Patient ambulated in the hallway with very little assistance.

## 2014-12-21 NOTE — ED Notes (Signed)
Pt placed on 2L O2 for SpO2 of 88%.

## 2014-12-21 NOTE — Discharge Instructions (Signed)

## 2014-12-21 NOTE — ED Notes (Signed)
Patient transported to X-ray 

## 2014-12-21 NOTE — ED Notes (Signed)
Attempted x 2 to notify [facility] at [place] to inform that the patient was returning via [place]. A voice message left.

## 2014-12-21 NOTE — ED Provider Notes (Signed)
CSN: [id number]     Arrival date & time 12/21/14  0611 History   First MD Initiated Contact with Patient 12/21/14 0703     Chief Complaint  Patient presents with  . Back Pain  . Neck Pain  . Shortness of Breath     (Consider location/radiation/quality/duration/timing/severity/associated sxs/prior Treatment) Patient is a 81 y.o. male presenting with fall.  Fall This is a new problem. The current episode started 1 to 2 hours ago. Episode frequency: once. The problem has been resolved. Associated symptoms include shortness of breath (2 weeks.  He thinks it has been improving. ). Associated symptoms comments: Upper back, lower neck pain.  Headache.. Exacerbated by: movement. Nothing relieves the symptoms. He has tried nothing for the symptoms.    Past Medical History  Diagnosis Date  . Osteoarthritis     hands/wrists  . Depression     antidepressant x 1 yr--situational. Resolved 08/2013 per pt/son.  . Glaucoma   . Hypertension   . 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  No significant residual deficit except short term memory impairment  . Urine incontinence   . Frequent headaches   . Chronic venous insufficiency     compression hose  . Peripheral neuropathy 2013    Dx'd by neuro in [place] at the time the patient was hospitalized briefly for pontine CVA.  . NSTEMI (non-ST elevated myocardial infarction) 06/2011    [place]: Troponin mildly elevated, normal EKG, normal ECHO, pt declined cardiology consult--per old PCP records.  . Basal cell carcinoma 2011    Face  . Chronic renal insufficiency, stage III (moderate) 2013    CrCl in the 50s  . Chronic diastolic heart failure   . Chronic left shoulder pain 09/14/2013  . Normocytic anemia 2015    secondary to CRI (iron studies ok 12/2013)  . Shortness of breath   . Chronic back pain   . Dementia 2016    Dr. Delice Lesch 08/2014--started aricept   Past Surgical History  Procedure Laterality Date  . Cholecystectomy    .  Tonsilectomy, adenoidectomy, bilateral myringotomy and tubes    . Transthoracic echocardiogram  12/18/13    Grade 1 diast dysf, o/w normal  . Nm vq lung scan ([facility] hx)  09/2014    Low risk   Family History  Problem Relation Age of Onset  . Cancer Mother   . Heart disease Mother    History  Substance Use Topics  . Smoking status: Former Smoker    Types: Pipe  . Smokeless tobacco: Never Used  . Alcohol Use: No    Review of Systems  Respiratory: Positive for cough (2 weeks.  He thinks it has been improving. ) and shortness of breath (2 weeks.  He thinks it has been improving. ).   All other systems reviewed and are negative.     Allergies  Altace; Amoxicillin; and Cozaar  Home Medications   Prior to Admission medications   Medication Sig Start Date End Date Taking? Authorizing Provider  aspirin 81 MG chewable tablet Chew 81 mg by mouth daily.   Yes Historical Provider, MD  atorvastatin (LIPITOR) 20 MG tablet Take 20 mg by mouth every evening.  10/28/13  Yes Historical Provider, MD  azelastine (OPTIVAR) 0.05 % ophthalmic solution Place 2 drops into both eyes 2 (two) times daily.   Yes Historical Provider, MD  citalopram (CELEXA) 10 MG tablet Take 1 tablet (10 mg total) by mouth daily. 09/25/14  Yes [name], MD  finasteride (PROSCAR) 5 MG tablet Take 1 tablet (5 mg total) by mouth daily. 01/28/14  Yes [name], MD  furosemide (LASIX) 40 MG tablet Take 40 mg by mouth daily.   Yes Historical Provider, MD  hydrALAZINE (APRESOLINE) 10 MG tablet Take 10 mg by mouth 3 (three) times daily.  08/12/13  Yes Historical Provider, MD  levobunolol (BETAGAN) 0.5 % ophthalmic solution Place 1 drop into both eyes 2 (two) times daily.   Yes Historical Provider, MD  Menthol, Topical Analgesic, (BIOFREEZE) 4 % GEL Apply 1 application topically 3 (three) times daily.   Yes Historical Provider, MD  tamsulosin (FLOMAX) 0.4 MG CAPS capsule Take 2 capsules (0.8 mg total) by mouth daily after  supper. 01/02/14  Yes [name], MD  tolterodine (DETROL LA) 2 MG 24 hr capsule 1 cap po qhs Patient taking differently: Take 2 mg by mouth daily. 1 cap po qhs 10/29/14  Yes [name], MD  baclofen (LIORESAL) 10 MG tablet Take 10 mg by mouth 3 (three) times daily as needed for muscle spasms.     Historical Provider, MD  HYDROcodone-acetaminophen (NORCO/VICODIN) 5-325 MG per tablet 1 tab by mouth every twelve hours to be given only WHEN REQUESTED BY PATIENT. Patient not taking: Reported on 12/21/2014 10/15/14   [name], MD  temazepam (RESTORIL) 7.5 MG capsule Take 1 capsule (7.5 mg total) by mouth at bedtime as needed for sleep. Patient not taking: Reported on 12/21/2014 01/14/14   [name], MD   BP 124/72 mmHg  Pulse 60  Temp(Src) 97.8 F (36.6 C) (Oral)  Resp 20  Ht 5\' 6"  (1.676 m)  Wt 153 lb (69.4 kg)  BMI 24.71 kg/m2  SpO2 98% Physical Exam  Constitutional: He is oriented to person, place, and time. He appears well-developed and well-nourished. No distress.  HENT:  Head: Normocephalic and atraumatic. Head is without raccoon's eyes and without Battle's sign.  Nose: Nose normal.  Eyes: Conjunctivae and EOM are normal. Pupils are equal, round, and reactive to light. No scleral icterus.  Neck: Spinous process tenderness (low c spine ) present. No muscular tenderness present.  Cardiovascular: Normal rate, regular rhythm, normal heart sounds and intact distal pulses.   No murmur heard. Pulmonary/Chest: Effort normal and breath sounds normal. He has no rales. He exhibits no tenderness.  Abdominal: Soft. There is no tenderness. There is no rebound and no guarding.  Musculoskeletal: Normal range of motion. He exhibits no edema.       Right hip: He exhibits bony tenderness. He exhibits normal range of motion and no deformity.       Left hip: He exhibits bony tenderness. He exhibits normal range of motion and no deformity.       Thoracic back: He exhibits tenderness and  bony tenderness.       Lumbar back: He exhibits no tenderness and no bony tenderness.  No evidence of trauma to extremities, except as noted.  2+ distal pulses.    Neurological: He is alert and oriented to person, place, and time.  Skin: Skin is warm and dry. No rash noted.  Psychiatric: He has a normal mood and affect.  Nursing note and vitals reviewed.   ED Course  Procedures (including critical care time) Labs Review Labs Reviewed - No data to display  Imaging Review Dg Chest 1 View  12/21/2014   CLINICAL DATA:  Fall, back pain.  Cough for 1 week.  EXAM: CHEST  1 VIEW  COMPARISON:  09/25/2014  FINDINGS: Low lung volumes. No confluent airspace opacities or effusions. Heart is normal size. No acute bony abnormality. No visible rib fracture or pneumothorax.  IMPRESSION: No acute findings.   Electronically Signed   By: Rolm Baptise M.D.   On: 12/21/2014 08:25   Dg Thoracic Spine 2 View  12/21/2014   CLINICAL DATA:  Fall today, back pain  EXAM: THORACIC SPINE - 2-3 VIEWS  COMPARISON:  Chest x-ray 09/25/2014  FINDINGS: diffuse osteopenia.  No fracture or malalignment visualized.  IMPRESSION: Osteopenia.  No acute bony abnormality.   Electronically Signed   By: Rolm Baptise M.D.   On: 12/21/2014 08:26   Ct Head Wo Contrast  12/21/2014   CLINICAL DATA:  Unwitnessed fall out of bed, no loss of consciousness, neck and shoulder pain, history hypertension, stroke, MI, heart failure, former smoker, dementia  EXAM: CT HEAD WITHOUT CONTRAST  CT CERVICAL SPINE WITHOUT CONTRAST  TECHNIQUE: Multidetector CT imaging of the head and cervical spine was performed following the standard protocol without intravenous contrast. Multiplanar CT image reconstructions of the cervical spine were also generated.  COMPARISON:  None  FINDINGS: CT HEAD FINDINGS  Generalized atrophy.  Normal ventricular morphology.  No midline shift or mass effect.  Small vessel chronic ischemic changes of deep cerebral white matter, question  centrally within pons as well.  No intracranial hemorrhage, mass lesion, or evidence acute infarction.  No extra-axial fluid collections.  BILATERAL maxillary sinus air-fluid levels with partial opacification of the ethmoid air cells and frontal sinus.  Extensive atherosclerotic calcification at carotid siphons.  Calvaria intact with clear mastoid air cells bilaterally.  CT CERVICAL SPINE FINDINGS  Osseous demineralization.  Visualized skullbase intact.  Prevertebral soft tissues normal thickness.  Marked multilevel degenerative disc disease changes of the cervical spine with disc space narrowing, endplate sclerosis and endplate spur formation.  Multilevel facet degenerative changes.  Vertebral body heights maintained without fracture or subluxation.  Scattered encroachment upon cervical foramina by uncovertebral spurs greatest at RIGHT C5-C6 and C3-C4.  Tips of lung apices clear.  Scattered atherosclerotic calcifications.  IMPRESSION: Atrophy with small vessel chronic ischemic changes of deep cerebral white matter.  No acute intracranial abnormalities.  BILATERAL maxillary sinus air-fluid levels.  Multilevel degenerative disc and facet disease changes cervical spine.  No acute cervical spine abnormalities.   Electronically Signed   By: Lavonia Dana M.D.   On: 12/21/2014 08:38   Ct Cervical Spine Wo Contrast  12/21/2014   CLINICAL DATA:  Unwitnessed fall out of bed, no loss of consciousness, neck and shoulder pain, history hypertension, stroke, MI, heart failure, former smoker, dementia  EXAM: CT HEAD WITHOUT CONTRAST  CT CERVICAL SPINE WITHOUT CONTRAST  TECHNIQUE: Multidetector CT imaging of the head and cervical spine was performed following the standard protocol without intravenous contrast. Multiplanar CT image reconstructions of the cervical spine were also generated.  COMPARISON:  None  FINDINGS: CT HEAD FINDINGS  Generalized atrophy.  Normal ventricular morphology.  No midline shift or mass effect.  Small  vessel chronic ischemic changes of deep cerebral white matter, question centrally within pons as well.  No intracranial hemorrhage, mass lesion, or evidence acute infarction.  No extra-axial fluid collections.  BILATERAL maxillary sinus air-fluid levels with partial opacification of the ethmoid air cells and frontal sinus.  Extensive atherosclerotic calcification at carotid siphons.  Calvaria intact with clear mastoid air cells bilaterally.  CT CERVICAL SPINE FINDINGS  Osseous demineralization.  Visualized skullbase intact.  Prevertebral soft tissues normal thickness.  Marked multilevel degenerative disc disease changes of the cervical spine with disc space narrowing, endplate sclerosis and endplate spur formation.  Multilevel facet degenerative changes.  Vertebral body heights maintained without fracture or subluxation.  Scattered encroachment upon cervical foramina by uncovertebral spurs greatest at RIGHT C5-C6 and C3-C4.  Tips of lung apices clear.  Scattered atherosclerotic calcifications.  IMPRESSION: Atrophy with small vessel chronic ischemic changes of deep cerebral white matter.  No acute intracranial abnormalities.  BILATERAL maxillary sinus air-fluid levels.  Multilevel degenerative disc and facet disease changes cervical spine.  No acute cervical spine abnormalities.   Electronically Signed   By: Lavonia Dana M.D.   On: 12/21/2014 08:38   Dg Hip Unilat With Pelvis 2-3 Views Left  12/21/2014   CLINICAL DATA:  Fall today. Bilateral hip pain, left greater than right.  EXAM: LEFT HIP (WITH PELVIS) 2-3 VIEWS  COMPARISON:  None.  FINDINGS: Slight joint space narrowing within the hips bilaterally. SI joints are symmetric and unremarkable. Degenerative changes in the visualized lower lumbar spine. No acute bony abnormality. Specifically, no fracture, subluxation, or dislocation. Soft tissues are intact.  IMPRESSION: No acute bony abnormality.   Electronically Signed   By: Rolm Baptise M.D.   On: 12/21/2014 08:28    Dg Hip Unilat With Pelvis 2-3 Views Right  12/21/2014   CLINICAL DATA:  [place] today, BILATERAL hip pain greater on LEFT  EXAM: RIGHT HIP (WITH PELVIS) 2-3 VIEWS  COMPARISON:  None  FINDINGS: Osseous demineralization.  Hip and SI joints symmetric and preserved.  Scattered clothing/bedding artifacts.  No acute fracture, dislocation or bone destruction.  Degenerative disc disease changes at visualized lower lumbar spine.  IMPRESSION: Osseous demineralization.  No acute RIGHT hip abnormalities.  Degenerative disc disease changes at visualized lower lumbar spine.   Electronically Signed   By: Lavonia Dana M.D.   On: 12/21/2014 08:26     EKG Interpretation   Date/Time:  Monday December 21 2014 06:28:13 EDT Ventricular Rate:  68 PR Interval:  128 QRS Duration: 97 QT Interval:  435 QTC Calculation: 463 R Axis:   -19 Text Interpretation:  Sinus rhythm Abnormal R-wave progression, late  transition Left ventricular hypertrophy Baseline wander in lead(s) V2 No  significant change was found Confirmed by [facility]  MD, TREY ([id number]) on  12/21/2014 8:41:33 AM      MDM   Final diagnoses:  Fall  Acute nonintractable headache, unspecified headache type  Neck pain  Midline thoracic back pain  Hip pain, unspecified laterality    81 yo male who fell from his bed.  He is unsure if he hit his head, but he was found with his head against a bedside table.  He also complains of headache.  Neck and back hurt as well.  On exam, had bilateral hip tenderness.  Report of low O2 sats.  Will need to check CXR as he has had a cough for a few weeks.    Imaging negative.  He has not been hypoxic.  He ambulated at usual assistance without difficulty.    Serita Grit, MD 12/21/14 1053

## 2014-12-23 NOTE — Telephone Encounter (Signed)
Note opened in error.

## 2014-12-30 ENCOUNTER — Ambulatory Visit (INDEPENDENT_AMBULATORY_CARE_PROVIDER_SITE_OTHER): Payer: Medicare Other | Admitting: Ophthalmology

## 2014-12-30 DIAGNOSIS — I10 Essential (primary) hypertension: Secondary | ICD-10-CM

## 2014-12-30 DIAGNOSIS — H4011X2 Primary open-angle glaucoma, moderate stage: Secondary | ICD-10-CM

## 2014-12-30 DIAGNOSIS — H43813 Vitreous degeneration, bilateral: Secondary | ICD-10-CM | POA: Diagnosis not present

## 2014-12-30 DIAGNOSIS — H3531 Nonexudative age-related macular degeneration: Secondary | ICD-10-CM

## 2014-12-30 DIAGNOSIS — H35033 Hypertensive retinopathy, bilateral: Secondary | ICD-10-CM | POA: Diagnosis not present

## 2014-12-31 ENCOUNTER — Ambulatory Visit (INDEPENDENT_AMBULATORY_CARE_PROVIDER_SITE_OTHER): Payer: Medicare Other | Admitting: Family Medicine

## 2014-12-31 ENCOUNTER — Encounter: Payer: Self-pay | Admitting: Family Medicine

## 2014-12-31 VITALS — BP 144/72 | HR 70 | Temp 97.3°F | Resp 16 | Ht 65.0 in | Wt 153.0 lb

## 2014-12-31 DIAGNOSIS — J208 Acute bronchitis due to other specified organisms: Secondary | ICD-10-CM

## 2014-12-31 MED ORDER — AZITHROMYCIN 250 MG PO TABS: ORAL_TABLET | ORAL | Status: DC

## 2014-12-31 MED ORDER — DM-GUAIFENESIN ER 30-600 MG PO TB12: 1.0000 | ORAL_TABLET | Freq: Two times a day (BID) | ORAL | Status: DC | PRN

## 2014-12-31 MED ORDER — ALBUTEROL SULFATE HFA 108 (90 BASE) MCG/ACT IN AERS: 2.0000 | INHALATION_SPRAY | Freq: Four times a day (QID) | RESPIRATORY_TRACT | Status: DC | PRN

## 2014-12-31 MED ORDER — PREDNISONE 20 MG PO TABS: ORAL_TABLET | ORAL | Status: DC

## 2014-12-31 MED ORDER — ALBUTEROL SULFATE (2.5 MG/3ML) 0.083% IN NEBU
2.5000 mg | INHALATION_SOLUTION | Freq: Once | RESPIRATORY_TRACT | Status: AC
  Administered 2014-12-31: 2.5 mg via RESPIRATORY_TRACT

## 2014-12-31 NOTE — Addendum Note (Signed)
Addended by: Lanae Crumbly on: 12/31/2014 04:25 PM   Modules accepted: Orders

## 2014-12-31 NOTE — Progress Notes (Signed)
OFFICE VISIT  12/31/2014   CC:  Chief Complaint  Patient presents with  . Cough    x 2 weeks   HPI:    Patient is a 81 y.o. Caucasian male who presents for cough. Onset about 2 weeks ago, getting worse, esp at night, denies fever.  Intermittently feels SOB, particularly when he gets in a coughing spell.  Occ sputum production, greyish color and thick.  No known wheezing.  No hemoptysis.  No meds have been given.  Denies ST, HA, or myalgias or rash.  He reports fatigue/feeling more sleepy last couple weeks.  Past Medical History  Diagnosis Date  . Osteoarthritis     hands/wrists  . Depression     antidepressant x 1 yr--situational. Resolved 08/2013 per pt/son.  . Glaucoma   . Hypertension   . 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  No significant residual deficit except short term memory impairment  . Urine incontinence   . Frequent headaches   . Chronic venous insufficiency     compression hose  . Peripheral neuropathy 2013    Dx'd by neuro in [place] at the time the patient was hospitalized briefly for pontine CVA.  . NSTEMI (non-ST elevated myocardial infarction) 06/2011    [place]: Troponin mildly elevated, normal EKG, normal ECHO, pt declined cardiology consult--per old PCP records.  . Basal cell carcinoma 2011    Face  . Chronic renal insufficiency, stage III (moderate) 2013    CrCl in the 50s  . Chronic diastolic heart failure   . Chronic left shoulder pain 09/14/2013  . Normocytic anemia 2015    secondary to CRI (iron studies ok 12/2013)  . Shortness of breath   . Chronic back pain   . Dementia 2016    Dr. Delice Lesch 08/2014--started aricept    Past Surgical History  Procedure Laterality Date  . Cholecystectomy    . Tonsilectomy, adenoidectomy, bilateral myringotomy and tubes    . Transthoracic echocardiogram  12/18/13    Grade 1 diast dysf, o/w normal  . Nm vq lung scan ([facility] hx)  09/2014    Low risk    Outpatient Prescriptions Prior to Visit  Medication  Sig Dispense Refill  . aspirin 81 MG chewable tablet Chew 81 mg by mouth daily.    Marland Kitchen atorvastatin (LIPITOR) 20 MG tablet Take 20 mg by mouth every evening.     Marland Kitchen azelastine (OPTIVAR) 0.05 % ophthalmic solution Place 2 drops into both eyes 2 (two) times daily.    . baclofen (LIORESAL) 10 MG tablet Take 10 mg by mouth 3 (three) times daily as needed for muscle spasms.     . citalopram (CELEXA) 10 MG tablet Take 1 tablet (10 mg total) by mouth daily. 30 tablet 6  . finasteride (PROSCAR) 5 MG tablet Take 1 tablet (5 mg total) by mouth daily. 30 tablet 6  . furosemide (LASIX) 40 MG tablet Take 40 mg by mouth daily.    . hydrALAZINE (APRESOLINE) 10 MG tablet Take 10 mg by mouth 3 (three) times daily.     Marland Kitchen HYDROcodone-acetaminophen (NORCO/VICODIN) 5-325 MG per tablet 1 tab by mouth every twelve hours to be given only WHEN REQUESTED BY PATIENT. 60 tablet 0  . levobunolol (BETAGAN) 0.5 % ophthalmic solution Place 1 drop into both eyes 2 (two) times daily.    . Menthol, Topical Analgesic, (BIOFREEZE) 4 % GEL Apply 1 application topically 3 (three) times daily.    . tamsulosin (FLOMAX) 0.4 MG CAPS capsule Take  2 capsules (0.8 mg total) by mouth daily after supper. 60 capsule 6  . temazepam (RESTORIL) 7.5 MG capsule Take 1 capsule (7.5 mg total) by mouth at bedtime as needed for sleep. 30 capsule 5  . tolterodine (DETROL LA) 2 MG 24 hr capsule 1 cap po qhs (Patient taking differently: Take 2 mg by mouth daily. 1 cap po qhs) 30 capsule 1   No facility-administered medications prior to visit.    Allergies  Allergen Reactions  . Altace [Ramipril] Other (See Comments)    As per old records from [place]  . Amoxicillin Other (See Comments)    Per old records from [place]  . Cozaar [Losartan Potassium] Other (See Comments)    As per old records from [place]    ROS As per HPI  PE: Blood pressure 144/72, pulse 70, temperature 97.3 F (36.3 C), temperature source Oral, resp. rate 16, height 5\' 5"  (1.651  m), weight 153 lb (69.4 kg), SpO2 94 %.  Gen: Alert, well appearing.  Patient is oriented to person, place, time, and situation. ENT: Ears: EACs clear, normal epithelium.  TMs with good light reflex and landmarks bilaterally.  Eyes: no injection, icteris, swelling, or exudate.  EOMI, PERRLA. Nose: no drainage or turbinate edema/swelling.  No injection or focal lesion.  Mouth: lips without lesion/swelling.  Oral mucosa pink and moist.  Dentition intact and without obvious caries or gingival swelling.  Oropharynx without erythema, exudate, or swelling.  Neck - No masses or thyromegaly or limitation in range of motion CV: RRR, distant S1 and S2 no audible m/r/g.   LUNGS: CTA bilat, nonlabored resps, good aeration in all lung fields.  Lots of coughing during the visit today.  LABS:  none  IMPRESSION AND PLAN:  Acute bronchitis, possibly bacterial. Azithromycin x 5d, prednisone x 10d taper (40 qd x 5d, then 20 qd x 5d), humibid DM for cough. Gave 2.5mg   albuterol neb in office today in office: he had less coughing and felt like his breathing was easier. I rx'd Proair HFA to take 2 p q6h prn and dispensed an aerochamber from the office to use with it. Nurse did inhaler/aerochamber education.  An After Visit Summary was printed and given to the patient.  FOLLOW UP: Return if symptoms worsen or fail to improve.

## 2014-12-31 NOTE — Progress Notes (Signed)
Pre visit review using our clinic review tool, if applicable. No additional management support is needed unless otherwise documented below in the visit note. 

## 2015-02-12 ENCOUNTER — Emergency Department (HOSPITAL_COMMUNITY): Payer: Medicare Other

## 2015-02-12 ENCOUNTER — Emergency Department (HOSPITAL_COMMUNITY)
Admission: EM | Admit: 2015-02-12 | Discharge: 2015-02-13 | Disposition: A | Discharge status: Home / Self Care | Payer: Medicare Other | Attending: Emergency Medicine | Admitting: Emergency Medicine

## 2015-02-12 ENCOUNTER — Encounter (HOSPITAL_COMMUNITY): Payer: Self-pay | Admitting: Emergency Medicine

## 2015-02-12 DIAGNOSIS — S20212A Contusion of left front wall of thorax, initial encounter: Secondary | ICD-10-CM | POA: Diagnosis not present

## 2015-02-12 DIAGNOSIS — S0990XA Unspecified injury of head, initial encounter: Secondary | ICD-10-CM | POA: Diagnosis present

## 2015-02-12 DIAGNOSIS — Y998 Other external cause status: Secondary | ICD-10-CM | POA: Insufficient documentation

## 2015-02-12 DIAGNOSIS — M1990 Unspecified osteoarthritis, unspecified site: Secondary | ICD-10-CM | POA: Diagnosis not present

## 2015-02-12 DIAGNOSIS — Y92128 Other place in nursing home as the place of occurrence of the external cause: Secondary | ICD-10-CM | POA: Diagnosis not present

## 2015-02-12 DIAGNOSIS — Z79899 Other long term (current) drug therapy: Secondary | ICD-10-CM | POA: Insufficient documentation

## 2015-02-12 DIAGNOSIS — G8929 Other chronic pain: Secondary | ICD-10-CM | POA: Insufficient documentation

## 2015-02-12 DIAGNOSIS — Z862 Personal history of diseases of the blood and blood-forming organs and certain disorders involving the immune mechanism: Secondary | ICD-10-CM | POA: Diagnosis not present

## 2015-02-12 DIAGNOSIS — R52 Pain, unspecified: Secondary | ICD-10-CM | POA: Diagnosis not present

## 2015-02-12 DIAGNOSIS — I5030 Unspecified diastolic (congestive) heart failure: Secondary | ICD-10-CM | POA: Insufficient documentation

## 2015-02-12 DIAGNOSIS — I252 Old myocardial infarction: Secondary | ICD-10-CM | POA: Insufficient documentation

## 2015-02-12 DIAGNOSIS — F329 Major depressive disorder, single episode, unspecified: Secondary | ICD-10-CM | POA: Insufficient documentation

## 2015-02-12 DIAGNOSIS — Z85828 Personal history of other malignant neoplasm of skin: Secondary | ICD-10-CM | POA: Diagnosis not present

## 2015-02-12 DIAGNOSIS — Y9301 Activity, walking, marching and hiking: Secondary | ICD-10-CM | POA: Diagnosis not present

## 2015-02-12 DIAGNOSIS — S4992XA Unspecified injury of left shoulder and upper arm, initial encounter: Secondary | ICD-10-CM | POA: Diagnosis not present

## 2015-02-12 DIAGNOSIS — H409 Unspecified glaucoma: Secondary | ICD-10-CM | POA: Diagnosis not present

## 2015-02-12 DIAGNOSIS — W01198A Fall on same level from slipping, tripping and stumbling with subsequent striking against other object, initial encounter: Secondary | ICD-10-CM | POA: Insufficient documentation

## 2015-02-12 DIAGNOSIS — Z8673 Personal history of transient ischemic attack (TIA), and cerebral infarction without residual deficits: Secondary | ICD-10-CM | POA: Diagnosis not present

## 2015-02-12 DIAGNOSIS — I129 Hypertensive chronic kidney disease with stage 1 through stage 4 chronic kidney disease, or unspecified chronic kidney disease: Secondary | ICD-10-CM | POA: Diagnosis not present

## 2015-02-12 DIAGNOSIS — F0390 Unspecified dementia without behavioral disturbance: Secondary | ICD-10-CM | POA: Insufficient documentation

## 2015-02-12 DIAGNOSIS — Z8669 Personal history of other diseases of the nervous system and sense organs: Secondary | ICD-10-CM | POA: Insufficient documentation

## 2015-02-12 DIAGNOSIS — Z7982 Long term (current) use of aspirin: Secondary | ICD-10-CM | POA: Diagnosis not present

## 2015-02-12 DIAGNOSIS — M25512 Pain in left shoulder: Secondary | ICD-10-CM | POA: Diagnosis not present

## 2015-02-12 DIAGNOSIS — Z87891 Personal history of nicotine dependence: Secondary | ICD-10-CM | POA: Insufficient documentation

## 2015-02-12 DIAGNOSIS — N183 Chronic kidney disease, stage 3 (moderate): Secondary | ICD-10-CM | POA: Diagnosis not present

## 2015-02-12 DIAGNOSIS — S79912A Unspecified injury of left hip, initial encounter: Secondary | ICD-10-CM | POA: Diagnosis not present

## 2015-02-12 DIAGNOSIS — S7002XA Contusion of left hip, initial encounter: Secondary | ICD-10-CM | POA: Diagnosis not present

## 2015-02-12 DIAGNOSIS — Z880 Allergy status to penicillin: Secondary | ICD-10-CM | POA: Insufficient documentation

## 2015-02-12 DIAGNOSIS — S0083XA Contusion of other part of head, initial encounter: Secondary | ICD-10-CM

## 2015-02-12 DIAGNOSIS — S299XXA Unspecified injury of thorax, initial encounter: Secondary | ICD-10-CM | POA: Diagnosis not present

## 2015-02-12 DIAGNOSIS — M25552 Pain in left hip: Secondary | ICD-10-CM | POA: Diagnosis not present

## 2015-02-12 MED ORDER — IBUPROFEN 800 MG PO TABS
800.0000 mg | ORAL_TABLET | Freq: Once | ORAL | Status: AC
  Administered 2015-02-12: 800 mg via ORAL
  Filled 2015-02-12: qty 1

## 2015-02-12 NOTE — ED Notes (Signed)
MD at bedside. Dr. Sabra Heck at bedside.

## 2015-02-12 NOTE — ED Notes (Signed)
[facility] called for transport.  

## 2015-02-12 NOTE — ED Provider Notes (Signed)
CSN: [id number]     Arrival date & time 02/12/15  1850 History   First MD Initiated Contact with Patient 02/12/15 1853     Chief Complaint  Patient presents with  . Fall     (Consider location/radiation/quality/duration/timing/severity/associated sxs/prior Treatment) HPI Comments: The pt is a 81 y/o male from [facility] - presents after a fall - states he tripped while walking with his walker falling to the ground and striking his L shoulder, hip and head.  He had no LOC and has no headache - he complains of no f/c/n/v and has no CP or SOB - he does have pain in the L side over the ribs.  No meds pta - paramedics used pelvic binder from sheets.    Patient is a 81 y.o. male presenting with fall. The history is provided by the patient.  Fall    Past Medical History  Diagnosis Date  . Osteoarthritis     hands/wrists  . Depression     antidepressant x 1 yr--situational. Resolved 08/2013 per pt/son.  . Glaucoma   . Hypertension   . 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  No significant residual deficit except short term memory impairment  . Urine incontinence   . Frequent headaches   . Chronic venous insufficiency     compression hose  . Peripheral neuropathy 2013    Dx'd by neuro in [place] at the time the patient was hospitalized briefly for pontine CVA.  . NSTEMI (non-ST elevated myocardial infarction) 06/2011    [place]: Troponin mildly elevated, normal EKG, normal ECHO, pt declined cardiology consult--per old PCP records.  . Basal cell carcinoma 2011    Face  . Chronic renal insufficiency, stage III (moderate) 2013    CrCl in the 50s  . Chronic diastolic heart failure   . Chronic left shoulder pain 09/14/2013  . Normocytic anemia 2015    secondary to CRI (iron studies ok 12/2013)  . Shortness of breath   . Chronic back pain   . Dementia 2016    Dr. Delice Lesch 08/2014--started aricept   Past Surgical History  Procedure Laterality Date  . Cholecystectomy    .  Tonsilectomy, adenoidectomy, bilateral myringotomy and tubes    . Transthoracic echocardiogram  12/18/13    Grade 1 diast dysf, o/w normal  . Nm vq lung scan ([facility] hx)  09/2014    Low risk   Family History  Problem Relation Age of Onset  . Cancer Mother   . Heart disease Mother    Social History  Substance Use Topics  . Smoking status: Former Smoker    Types: Pipe  . Smokeless tobacco: Never Used  . Alcohol Use: No    Review of Systems  All other systems reviewed and are negative.     Allergies  Altace; Amoxicillin; and Cozaar  Home Medications   Prior to Admission medications   Medication Sig Start Date End Date Taking? Authorizing Provider  albuterol (PROAIR HFA) 108 (90 BASE) MCG/ACT inhaler Inhale 2 puffs into the lungs every 6 (six) hours as needed for wheezing or shortness of breath. 12/31/14   [name], MD  aspirin 81 MG chewable tablet Chew 81 mg by mouth daily.    Historical Provider, MD  atorvastatin (LIPITOR) 20 MG tablet Take 20 mg by mouth every evening.  10/28/13   Historical Provider, MD  azelastine (OPTIVAR) 0.05 % ophthalmic solution Place 2 drops into both eyes 2 (two) times daily.    Historical  Provider, MD  azithromycin (ZITHROMAX) 250 MG tablet 2 tabs po qd x 1d, then 1 tab po qd x 4d 12/31/14   [name], MD  baclofen (LIORESAL) 10 MG tablet Take 10 mg by mouth 3 (three) times daily as needed for muscle spasms.     Historical Provider, MD  citalopram (CELEXA) 10 MG tablet Take 1 tablet (10 mg total) by mouth daily. 09/25/14   [name], MD  dextromethorphan-guaiFENesin [facility] DM) 30-600 MG per 12 hr tablet Take 1 tablet by mouth 2 (two) times daily as needed for cough. 12/31/14   [name], MD  finasteride (PROSCAR) 5 MG tablet Take 1 tablet (5 mg total) by mouth daily. 01/28/14   [name], MD  furosemide (LASIX) 40 MG tablet Take 40 mg by mouth daily.    Historical Provider, MD  hydrALAZINE (APRESOLINE) 10 MG tablet Take 10  mg by mouth 3 (three) times daily.  08/12/13   Historical Provider, MD  HYDROcodone-acetaminophen (NORCO/VICODIN) 5-325 MG per tablet 1 tab by mouth every twelve hours to be given only WHEN REQUESTED BY PATIENT. 10/15/14   [name], MD  levobunolol (BETAGAN) 0.5 % ophthalmic solution Place 1 drop into both eyes 2 (two) times daily.    Historical Provider, MD  Menthol, Topical Analgesic, (BIOFREEZE) 4 % GEL Apply 1 application topically 3 (three) times daily.    Historical Provider, MD  predniSONE (DELTASONE) 20 MG tablet 2 tabs po qd x 5d, then 1 tab po qd x 5d 12/31/14   [name], MD  tamsulosin (FLOMAX) 0.4 MG CAPS capsule Take 2 capsules (0.8 mg total) by mouth daily after supper. 01/02/14   [name], MD  temazepam (RESTORIL) 7.5 MG capsule Take 1 capsule (7.5 mg total) by mouth at bedtime as needed for sleep. 01/14/14   [name], MD  tolterodine (DETROL LA) 2 MG 24 hr capsule 1 cap po qhs Patient taking differently: Take 2 mg by mouth daily. 1 cap po qhs 10/29/14   [name], MD   BP 130/47 mmHg  Pulse 69  Temp(Src) 98.3 F (36.8 C) (Oral)  Resp 18  SpO2 92% Physical Exam  Constitutional: He appears well-developed and well-nourished. No distress.  HENT:  Head: Normocephalic.  Mouth/Throat: Oropharynx is clear and moist. No oropharyngeal exudate.  Small abrasion over left anterior forehead lateral to eyebrow, no hematoma, no malocclusion, no hemotympanum, no raccoon eyes, no battle sign  Eyes: Conjunctivae and EOM are normal. Pupils are equal, round, and reactive to light. Right eye exhibits no discharge. Left eye exhibits no discharge. No scleral icterus.  Neck: Normal range of motion. Neck supple. No JVD present. No thyromegaly present.  Cardiovascular: Normal rate, regular rhythm, normal heart sounds and intact distal pulses.  Exam reveals no gallop and no friction rub.   No murmur heard. Pulmonary/Chest: Effort normal and breath sounds normal. No  respiratory distress. He has no wheezes. He has no rales. He exhibits tenderness.  Abdominal: Soft. Bowel sounds are normal. He exhibits no distension and no mass. There is no tenderness.  Musculoskeletal: He exhibits tenderness. He exhibits no edema.  Decreased range of motion of the left shoulder secondary to pain, decreased range of motion of the left hip minimally, no leg length discrepancies, preserved ability to straight leg raise. Tenderness over the left chest wall, no crepitance or subcutaneous emphysema. No tenderness over the cervical thoracic or lumbar spines  Lymphadenopathy:    He has  no cervical adenopathy.  Neurological: He is alert. Coordination normal.  Skin: Skin is warm and dry. No rash noted. No erythema.  Psychiatric: He has a normal mood and affect. His behavior is normal.  Nursing note and vitals reviewed.   ED Course  Procedures (including critical care time) Labs Review Labs Reviewed - No data to display  Imaging Review No results found. I have personally reviewed and evaluated these images and lab results as part of my medical decision-making.    MDM   Final diagnoses:  None    Overall the patient is well-appearing, he has a small abrasion to his left forehead, no hematoma, no altered mental status, follows commands perfectly, doubt joint injury but because of pain over her joints with traumatic fall in the elderly will obtain imaging. Possible rib injury.  No fractures  I have personally viewed and interpreted the imaging and agree with radiologist interpretation.    Noemi Chapel, MD 02/12/15 2126

## 2015-02-12 NOTE — ED Notes (Signed)
Patient arrived via EMS from [facility] off of [address].  Patient was walking back to room and fell.  Fall was witnessed by nursing staff at facility.  He is complaining of left shoulder and left hip pain.  Patient states it is painful to palpate.  He states pain level is a 10.  Per EMS, no spinal pain.  EMS states patient has been A&O x 4.  Patient was given 50 mcg Fentanyl by EMS staff.

## 2015-02-12 NOTE — ED Notes (Signed)
Bed: VE55 Expected date:  Expected time:  Means of arrival:  Comments: 27M SNF fall d/t dizziness/Lt shoulder and hip Given Fentanyl

## 2015-02-12 NOTE — ED Notes (Signed)
Pt given a urinal to give a sample if needed.

## 2015-02-12 NOTE — Discharge Instructions (Signed)
Tylenol or ibuprofen for pain - your xrays show no fractures See your doctor as needed

## 2015-02-24 DIAGNOSIS — Z85828 Personal history of other malignant neoplasm of skin: Secondary | ICD-10-CM | POA: Diagnosis not present

## 2015-02-24 DIAGNOSIS — L570 Actinic keratosis: Secondary | ICD-10-CM | POA: Diagnosis not present

## 2015-02-24 DIAGNOSIS — L814 Other melanin hyperpigmentation: Secondary | ICD-10-CM | POA: Diagnosis not present

## 2015-02-24 DIAGNOSIS — D485 Neoplasm of uncertain behavior of skin: Secondary | ICD-10-CM | POA: Diagnosis not present

## 2015-02-24 DIAGNOSIS — D1801 Hemangioma of skin and subcutaneous tissue: Secondary | ICD-10-CM | POA: Diagnosis not present

## 2015-02-24 DIAGNOSIS — L821 Other seborrheic keratosis: Secondary | ICD-10-CM | POA: Diagnosis not present

## 2015-02-25 ENCOUNTER — Ambulatory Visit (INDEPENDENT_AMBULATORY_CARE_PROVIDER_SITE_OTHER): Payer: Medicare Other | Admitting: Family Medicine

## 2015-02-25 ENCOUNTER — Encounter: Payer: Self-pay | Admitting: Family Medicine

## 2015-02-25 VITALS — BP 125/71 | HR 64 | Temp 97.5°F | Resp 16 | Wt 156.0 lb

## 2015-02-25 DIAGNOSIS — Z23 Encounter for immunization: Secondary | ICD-10-CM

## 2015-02-25 DIAGNOSIS — N183 Chronic kidney disease, stage 3 unspecified: Secondary | ICD-10-CM

## 2015-02-25 DIAGNOSIS — C44319 Basal cell carcinoma of skin of other parts of face: Secondary | ICD-10-CM | POA: Diagnosis not present

## 2015-02-25 DIAGNOSIS — C44311 Basal cell carcinoma of skin of nose: Secondary | ICD-10-CM | POA: Diagnosis not present

## 2015-02-25 NOTE — Progress Notes (Signed)
Pre visit review using our clinic review tool, if applicable. No additional management support is needed unless otherwise documented below in the visit note. 

## 2015-02-25 NOTE — Progress Notes (Signed)
OFFICE VISIT  02/28/2015   CC:  Chief Complaint  Patient presents with  . Follow-up   HPI:    Patient is a 81 y.o. Caucasian male who presents for 3 mo f/u chronic LE venous insuff, HTN, BPH+OAB, CRI stage III.   Latest compression stockings were too small so son is working on getting another pair.  LE edema seems stable to son/pt. Gets up only 1 time to urinate at night.   No bp numbers to report.  Chronic back pain still problematic, asks about getting a tens unit.  He rarely asks for vicodin he says.  Had a fall recently, went to ED, nothing broken.  Still some soreness in L knee.  ROS: no fever, no CP, no SOB, no cough, no abd pain, no n/v   Past Medical History  Diagnosis Date  . Osteoarthritis     hands/wrists  . Depression     antidepressant x 1 yr--situational. Resolved 08/2013 per pt/son.  . Glaucoma   . Hypertension   . 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  No significant residual deficit except short term memory impairment  . Urine incontinence   . Frequent headaches   . Chronic venous insufficiency     compression hose  . Peripheral neuropathy 2013    Dx'd by neuro in [place] at the time the patient was hospitalized briefly for pontine CVA.  . NSTEMI (non-ST elevated myocardial infarction) 06/2011    [place]: Troponin mildly elevated, normal EKG, normal ECHO, pt declined cardiology consult--per old PCP records.  . Basal cell carcinoma 2011    Face  . Chronic renal insufficiency, stage III (moderate) 2013    CrCl in the 50s  . Chronic diastolic heart failure   . Chronic left shoulder pain 09/14/2013  . Normocytic anemia 2015    secondary to CRI (iron studies ok 12/2013)  . Shortness of breath   . Chronic back pain   . Dementia 2016    Dr. Delice Lesch 08/2014--started aricept    Past Surgical History  Procedure Laterality Date  . Cholecystectomy    . Tonsilectomy, adenoidectomy, bilateral myringotomy and tubes    . Transthoracic echocardiogram   12/18/13    Grade 1 diast dysf, o/w normal  . Nm vq lung scan ([facility] hx)  09/2014    Low risk    Outpatient Prescriptions Prior to Visit  Medication Sig Dispense Refill  . aspirin 81 MG chewable tablet Chew 81 mg by mouth daily.    Marland Kitchen atorvastatin (LIPITOR) 20 MG tablet Take 20 mg by mouth every evening.     Marland Kitchen azelastine (OPTIVAR) 0.05 % ophthalmic solution Place 2 drops into both eyes 2 (two) times daily.    . baclofen (LIORESAL) 10 MG tablet Take 10 mg by mouth 3 (three) times daily as needed for muscle spasms.     . citalopram (CELEXA) 10 MG tablet Take 1 tablet (10 mg total) by mouth daily. 30 tablet 6  . dextromethorphan-guaiFENesin (MUCINEX DM) 30-600 MG per 12 hr tablet Take 1 tablet by mouth 2 (two) times daily as needed for cough. 30 tablet 0  . finasteride (PROSCAR) 5 MG tablet Take 1 tablet (5 mg total) by mouth daily. 30 tablet 6  . furosemide (LASIX) 40 MG tablet Take 40 mg by mouth daily.    . hydrALAZINE (APRESOLINE) 10 MG tablet Take 10 mg by mouth 3 (three) times daily.     Marland Kitchen HYDROcodone-acetaminophen (NORCO/VICODIN) 5-325 MG per tablet 1 tab by mouth  every twelve hours to be given only WHEN REQUESTED BY PATIENT. 60 tablet 0  . levobunolol (BETAGAN) 0.5 % ophthalmic solution Place 1 drop into both eyes 2 (two) times daily.    . tamsulosin (FLOMAX) 0.4 MG CAPS capsule Take 2 capsules (0.8 mg total) by mouth daily after supper. 60 capsule 6  . temazepam (RESTORIL) 7.5 MG capsule Take 1 capsule (7.5 mg total) by mouth at bedtime as needed for sleep. 30 capsule 5  . tolterodine (DETROL LA) 2 MG 24 hr capsule 1 cap po qhs (Patient taking differently: Take 2 mg by mouth daily. 1 cap po qhs) 30 capsule 1  . albuterol (PROAIR HFA) 108 (90 BASE) MCG/ACT inhaler Inhale 2 puffs into the lungs every 6 (six) hours as needed for wheezing or shortness of breath. (Patient not taking: Reported on 02/25/2015) 1 Inhaler 0  . azithromycin (ZITHROMAX) 250 MG tablet 2 tabs po qd x 1d, then 1 tab po qd x  4d (Patient not taking: Reported on 02/25/2015) 6 tablet 0  . Menthol, Topical Analgesic, (BIOFREEZE) 4 % GEL Apply 1 application topically 3 (three) times daily.    . predniSONE (DELTASONE) 20 MG tablet 2 tabs po qd x 5d, then 1 tab po qd x 5d (Patient not taking: Reported on 02/25/2015) 15 tablet 0   No facility-administered medications prior to visit.    Allergies  Allergen Reactions  . Altace [Ramipril] Other (See Comments)    As per old records from [place]  . Amoxicillin Other (See Comments)    Per old records from [place]  . Cozaar [Losartan Potassium] Other (See Comments)    As per old records from [place]    ROS As per HPI  PE: Blood pressure 125/71, pulse 64, temperature 97.5 F (36.4 C), temperature source Oral, resp. rate 16, weight 156 lb (70.761 kg), SpO2 96 %. Gen: Alert, well appearing.  Patient is oriented to person, place, time, and situation. CV: RRR, no m/r/g LUNGS: very soft, early insp crackles in both bases, otherwise clear bilaterally, good aeration.  Nonlabored resps EXT: 2 + pitting edema in both LLs.  No cyanosis or clubbing.  LABS:    Chemistry      Component Value Date/Time   NA 141 02/25/2015 1517   K 4.2 02/25/2015 1517   CL 103 02/25/2015 1517   CO2 32 02/25/2015 1517   BUN 24* 02/25/2015 1517   CREATININE 1.09 02/25/2015 1517   CREATININE 1.44* 01/16/2014 1658      Component Value Date/Time   CALCIUM 9.0 02/25/2015 1517   ALKPHOS 78 02/19/2014 0909   AST 20 02/19/2014 0909   ALT 17 02/19/2014 0909   BILITOT 0.4 02/19/2014 0909       IMPRESSION AND PLAN:  1) LE venous insufficiency edema: The current medical regimen is effective;  continue present plan and medications. BMET today.  2) HTN; The current medical regimen is effective;  continue present plan and medications.  3) BPH/OAB: urinary sx's well controlled on current regimen.  4) CRI, stage III: BMET today.  An After Visit Summary was printed and given to the  patient.  FOLLOW UP: Return in about 3 months (around 05/27/2015) for routine chronic illness f/u.

## 2015-02-26 LAB — BASIC METABOLIC PANEL
BUN: 24 mg/dL — ABNORMAL HIGH (ref 6–23)
CALCIUM: 9 mg/dL (ref 8.4–10.5)
CO2: 32 mEq/L (ref 19–32)
Chloride: 103 mEq/L (ref 96–112)
Creatinine, Ser: 1.09 mg/dL (ref 0.40–1.50)
GFR: 67.54 mL/min (ref >60.00)
Glucose, Bld: 116 mg/dL — ABNORMAL HIGH (ref 70–99)
POTASSIUM: 4.2 meq/L (ref 3.5–5.1)
SODIUM: 141 meq/L (ref 135–145)

## 2015-03-17 ENCOUNTER — Encounter: Payer: Self-pay | Admitting: Family Medicine

## 2015-03-19 ENCOUNTER — Ambulatory Visit: Payer: Medicare Other | Admitting: Podiatry

## 2015-03-25 ENCOUNTER — Ambulatory Visit (INDEPENDENT_AMBULATORY_CARE_PROVIDER_SITE_OTHER): Payer: Medicare Other | Admitting: Podiatry

## 2015-03-25 ENCOUNTER — Encounter: Payer: Self-pay | Admitting: Podiatry

## 2015-03-25 DIAGNOSIS — B351 Tinea unguium: Secondary | ICD-10-CM

## 2015-03-25 DIAGNOSIS — M79675 Pain in left toe(s): Secondary | ICD-10-CM

## 2015-03-25 DIAGNOSIS — M79674 Pain in right toe(s): Secondary | ICD-10-CM | POA: Diagnosis not present

## 2015-03-25 NOTE — Progress Notes (Signed)
Patient ID: Lance Kemp, male   DOB: 11-27-1922, 81 y.o.   MRN: [id number] Complaint:  Visit Type: Patient returns to my office for continued preventative foot care services. Complaint: Patient states" my nails have grown long and thick and become painful to walk and wear shoes" Patient has been diagnosed with DM with no complications. He presents for preventative foot care services. No changes to ROS  Podiatric Exam: Vascular: dorsalis pedis and posterior tibial pulses are palpable bilateral. Capillary return is immediate. Temperature gradient is WNL. Skin turgor WNL  Sensorium: Normal Semmes Weinstein monofilament test. Normal tactile sensation bilaterally. Nail Exam: Pt has thick disfigured discolored nails with subungual debris noted bilateral entire nail hallux through fifth toenails Ulcer Exam: There is no evidence of ulcer or pre-ulcerative changes or infection. Orthopedic Exam: Muscle tone and strength are WNL. No limitations in general ROM. No crepitus or effusions noted. Foot type and digits show no abnormalities. Bony prominences are unremarkable. Skin: No Porokeratosis. No infection or ulcers  Diagnosis:  Tinea unguium, Pain in right toe, pain in left toes  Treatment & Plan Procedures and Treatment: Consent by patient was obtained for treatment procedures. The patient understood the discussion of treatment and procedures well. All questions were answered thoroughly reviewed. Debridement of mycotic and hypertrophic toenails, 1 through 5 bilateral and clearing of subungual debris. No ulceration, no infection noted.  Return Visit-Office Procedure: Patient instructed to return to the office for a follow up visit 3 months for continued evaluation and treatment.

## 2015-03-26 ENCOUNTER — Ambulatory Visit: Payer: Medicare Other

## 2015-03-26 ENCOUNTER — Ambulatory Visit: Payer: Medicare Other | Admitting: Radiation Oncology

## 2015-04-02 ENCOUNTER — Telehealth: Payer: Self-pay | Admitting: *Deleted

## 2015-04-06 ENCOUNTER — Telehealth: Payer: Self-pay | Admitting: *Deleted

## 2015-04-06 NOTE — Progress Notes (Signed)
Histology and Location of Primary Skin Cancer:  02/24/2015 Skin, shave biopsy, L ala: Basal cell carcinoma, nodular type Skin, shave biopsy, L glabella above brow: Basal cell carcinoma, infiltrative type.   Lance Kemp presented to Dr. Renda Rolls for a normal dermatologic exam 02/24/2015 and had his biopsy the same day.    Past/Anticipated interventions by patient's surgeon/dermatologist for current problematic lesion, if any: Biopsy performed 02/24/15 by Dr. Jefm Petty.   Past skin cancers, if any: Dr. Harvel Quale performed surgery last year. The area's were below L Ear, and above L eyebrow. He had surgery to remove those at that time. The family reports they were basal cell carcinomas.     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   SAFETY ISSUES:  Prior radiation? No  Pacemaker/ICD? No  Possible current pregnancy? No Is the patient on methotrexate? No Current Complaints / other details:

## 2015-04-06 NOTE — Telephone Encounter (Signed)
  Oncology Nurse Navigator Documentation Referral date to RadOnc/MedOnc: 03/16/15 (referral rec'd 03/31/15 from RadOnc sec) (04/06/15 9324)   Patient Visit Type: Follow-up (04/06/15 1602)     In follow-up to last week's introductory call/VMM, called patient/son to see if they had any questions prior to tomorrow's appt with Dr. Isidore Moos, LVMM.  Gayleen Orem, RN, BSN, [place] [facility] [place] [phone]                   Time Spent with Patient: 15 (04/06/15 1602)

## 2015-04-06 NOTE — Telephone Encounter (Signed)
  Oncology Nurse Navigator Documentation  Referral date to RadOnc/MedOnc: 03/16/15 (referral notification rec'd 03/31/15 from [place]) (04/02/15 1430) Navigator Encounter Type: Introductory phone call (04/02/15 1430)     Placed new referral introductory call to introduce self and confirm next [facility] appt.  LVMM, requested return call.  Gayleen Orem, RN, BSN, [place] [facility] [place] [phone]                     Time Spent with Patient: 15 (04/02/15 1430)

## 2015-04-07 ENCOUNTER — Encounter: Payer: Self-pay | Admitting: *Deleted

## 2015-04-07 ENCOUNTER — Ambulatory Visit
Admission: RE | Admit: 2015-04-07 | Discharge: 2015-04-07 | Disposition: A | Discharge status: Home / Self Care | Payer: Medicare Other | Source: Ambulatory Visit | Attending: Radiation Oncology | Admitting: Radiation Oncology

## 2015-04-07 ENCOUNTER — Encounter: Payer: Self-pay | Admitting: Radiation Oncology

## 2015-04-07 VITALS — BP 125/38 | HR 67 | Temp 97.7°F | Ht 65.0 in | Wt 155.7 lb

## 2015-04-07 DIAGNOSIS — C44310 Basal cell carcinoma of skin of unspecified parts of face: Secondary | ICD-10-CM | POA: Diagnosis not present

## 2015-04-07 DIAGNOSIS — E785 Hyperlipidemia, unspecified: Secondary | ICD-10-CM

## 2015-04-07 DIAGNOSIS — C4490 Unspecified malignant neoplasm of skin, unspecified: Secondary | ICD-10-CM

## 2015-04-07 NOTE — Progress Notes (Signed)
Radiation Oncology         [phone]) [phone] ________________________________  Initial outpatient Consultation  Name: Lance Kemp MRN: [id number]  Date: 04/07/2015  DOB: [birth year]  DP:OEUMPNT,IRWERX H, MD  McGowen, [name], MD   REFERRING PHYSICIAN: [name], MD  DIAGNOSIS: Basal cell carcinoma of the left glabella, infiltrative Basal cell carcinoma of the left ala, nodular   ICD-9-CM ICD-10-CM   1. Skin cancer 173.90 C44.90 Ambulatory referral to Social Work    Lance Kemp is a 81 y.o. 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  Last year he reports basal cell carcinomas were excised from his left ear and his left eyebrow by Dr. Harvel Quale.  More recently, Dr. Jeannine Boga examined him from the waist up in dermatology.  Blade biopsy of the left ala and left glabella above brow was performed.  Pathology revealed findings described above in the diagnosis.  He first began having cancer excisions in [place] in 2012.  He was referred from his family doctor after failure to improve from being frozen with liquid nitrogen.    His new skin areas are not recurrent, he and his son think.  He notes that he felt soreness on his face from the areas and this was alleviated with petroleum jelly.  No other reported symptoms.  PREVIOUS RADIATION THERAPY: No  PAST MEDICAL HISTORY:  has a past medical history of Osteoarthritis; Depression; Glaucoma; Hypertension; CVA (cerebral infarction) (2013); Urine incontinence; Frequent headaches; Chronic venous insufficiency; Peripheral neuropathy ([place]) (2013); NSTEMI (non-ST elevated myocardial infarction) ([place]) (06/2011); Basal cell carcinoma (2011; 2016); Chronic renal insufficiency, stage III (moderate) (2013); Chronic diastolic heart failure ([place]); Chronic left shoulder pain (09/14/2013); Normocytic anemia (2015); Shortness of breath; Chronic back pain; and Dementia (2016).     PAST SURGICAL HISTORY: Past Surgical History  Procedure Laterality Date  . Cholecystectomy    . Tonsilectomy, adenoidectomy, bilateral myringotomy and tubes    . Transthoracic echocardiogram  12/18/13    Grade 1 diast dysf, o/w normal  . Nm vq lung scan ([facility] hx)  09/2014    Low risk    FAMILY HISTORY: family history includes Cancer in his mother; Heart disease in his mother.  SOCIAL HISTORY:  reports that he has quit smoking. His smoking use included Pipe. He has never used smokeless tobacco. He reports that he does not drink alcohol or use illicit drugs.  ALLERGIES: Altace; Amoxicillin; and Cozaar  MEDICATIONS:  Current Outpatient Prescriptions  Medication Sig Dispense Refill  . aspirin 81 MG chewable tablet Chew 81 mg by mouth daily.    Marland Kitchen atorvastatin (LIPITOR) 20 MG tablet Take 20 mg by mouth every evening.     Marland Kitchen azelastine (OPTIVAR) 0.05 % ophthalmic solution Place 2 drops into both eyes 2 (two) times daily.    . baclofen (LIORESAL) 10 MG tablet Take 10 mg by mouth 3 (three) times daily as needed for muscle spasms.     . citalopram (CELEXA) 10 MG tablet Take 1 tablet (10 mg total) by mouth daily. 30 tablet 6  . finasteride (PROSCAR) 5 MG tablet Take 1 tablet (5 mg total) by mouth daily. 30 tablet 6  . furosemide (LASIX) 40 MG tablet Take 40 mg by mouth daily.    . hydrALAZINE (APRESOLINE) 10 MG tablet Take 10 mg by mouth 3 (three) times daily.     Marland Kitchen HYDROcodone-acetaminophen (NORCO/VICODIN) 5-325 MG per tablet 1 tab by mouth every twelve  hours to be given only WHEN REQUESTED BY PATIENT. 60 tablet 0  . levobunolol (BETAGAN) 0.5 % ophthalmic solution Place 1 drop into both eyes 2 (two) times daily.    . tamsulosin (FLOMAX) 0.4 MG CAPS capsule Take 2 capsules (0.8 mg total) by mouth daily after supper. 60 capsule 6  . temazepam (RESTORIL) 7.5 MG capsule Take 1 capsule (7.5 mg total) by mouth at bedtime as needed for sleep. 30 capsule 5  . tolterodine (DETROL LA) 2 MG 24 hr capsule  1 cap po qhs (Patient taking differently: Take 2 mg by mouth daily. 1 cap po qhs) 30 capsule 1  . dextromethorphan-guaiFENesin (MUCINEX DM) 30-600 MG per 12 hr tablet Take 1 tablet by mouth 2 (two) times daily as needed for cough. (Patient not taking: Reported on 04/07/2015) 30 tablet 0   No current facility-administered medications for this encounter.    REVIEW OF SYSTEMS:  Notable for that above.   PHYSICAL EXAM:  height is 5\' 5"  (1.651 m) and weight is 155 lb 11.2 oz (70.625 kg). His temperature is 97.7 F (36.5 C). His blood pressure is 125/38 and his pulse is 67.   Marland Kitchen   General: Alert and oriented, in no acute distress HEENT: Pearly papular lesion that is over the left ala of the nose less than 1 cm in size,  Above medial  left brow a clustered area of small nodules that are erythematous, total area measuring 1-2 cm  Head is normocephalic. Extraocular movements are intact. Oropharynx is clear.  No thrush  No lymphadenopathy in facial nodes. Neck: Neck is supple, no palpable cervical or supraclavicular lymphadenopathy.  No posterior auricular, pre-auricular, no masses. Heart: Regular in rate and rhythm   Chest: Clear to auscultation bilaterally, with no rhonchi, wheezes, or rales. Abdomen: Soft, nontender, nondistended, with no rigidity or guarding. Extremities: Tenderness to palpation in the ankles.  Bilateral leg edema, more in left. Lymphatics: see Neck Exam Skin: No concerning lesions. Psych: affect normal, insight intact. Neuro: no obvious focalities     ECOG = 2  0 - Asymptomatic (Fully active, able to carry on all predisease activities without restriction)  1 - Symptomatic but completely ambulatory (Restricted in physically strenuous activity but ambulatory and able to carry out work of a light or sedentary nature. For example, light housework, office work)  2 - Symptomatic, <50% in bed during the day (Ambulatory and capable of all self care but unable to carry out any work  activities. Up and about more than 50% of waking hours)  3 - Symptomatic, >50% in bed, but not bedbound (Capable of only limited self-care, confined to bed or chair 50% or more of waking hours)  4 - Bedbound (Completely disabled. Cannot carry on any self-care. Totally confined to bed or chair)  5 - Death   Eustace Pen MM, Creech RH, Tormey DC, et al. [phone]). "Toxicity and response criteria of the [facility]". [place] Oncol. 5 (6): 649-55   LABORATORY DATA:  Lab Results  Component Value Date   WBC 7.9 09/25/2014   HGB 11.6* 09/25/2014   HCT 34.0* 09/25/2014   MCV 86.6 09/25/2014   PLT 211 09/25/2014   CMP     Component Value Date/Time   NA 141 02/25/2015 1517   K 4.2 02/25/2015 1517   CL 103 02/25/2015 1517   CO2 32 02/25/2015 1517   GLUCOSE 116* 02/25/2015 1517   BUN 24* 02/25/2015 1517   CREATININE 1.09 02/25/2015 1517  CREATININE 1.44* 01/16/2014 1658   CALCIUM 9.0 02/25/2015 1517   PROT 6.3 02/19/2014 0909   ALBUMIN 3.2* 02/19/2014 0909   AST 20 02/19/2014 0909   ALT 17 02/19/2014 0909   ALKPHOS 78 02/19/2014 0909   BILITOT 0.4 02/19/2014 0909   GFRNONAA 31* 09/25/2014 0211   GFRAA 36* 09/25/2014 0211         RADIOGRAPHY: No results found.    IMPRESSION/PLAN: Basal cell carcinoma, face.  Discussed radiation treatment options.  Discussed recommended regimen of twice weekly treatments x 5 weeks. His son notes that the period is too long for the patient to handle.  He also notes that there will be transportation issue.  They are leaning heavily toward surgery.  I still discussed the procedures and what to expect during treatment, but they are declining radiotherapy.  I wished them the best.     This document serves as a record of services personally performed by Eppie Gibson, MD. It was created on her behalf by Janace Hoard, a trained medical scribe. The creation of this record is based on the scribe's personal observations and the  provider's statements to them. This document has been checked and approved by the attending provider.  __________________________________________   Eppie Gibson, MD

## 2015-04-08 NOTE — Progress Notes (Signed)
  Oncology Nurse Navigator Documentation   Navigator Encounter Type: Initial RadOnc (04/07/15 1525)     Met with patient during initial consult with Dr. Isidore Moos.  He was accompanied by his son.  Patient is currently resident of [facility], skilled nursing. 1. Further introduced myself as his Navigator, explained my role as a member of the Care Team.   2. Provided New Patient Information packet, discussed contents:  Contact information for physician(s), myself, other members of the Care Team.  Advance Directive information ([place] blue pamphlet with LCSW contact info)  Fall Prevention Patient Safety Plan  Appointment Guideline  Financial Assistance Information sheet  ACS Referral form  [facility] campus map with highlight of [place] 3. Provided introductory explanation of radiation treatment including SIM planning and purpose of Aquaplast head and shoulder mask, showed them example.   4. After discussion with Dr. Isidore Moos re proposed RT plan for his facial basal cell carcinomas, he opted for surgery with his dermatologist.  Son stated logistics of twice weekly tmts for 5 wks was not feasible as he provides his dad's transportation and he does not live in [place], regime wd require too much travel time from his home and interfere with his job.  5. I encouraged them to contact me in the future with needs/questions.  Gayleen Orem, RN, BSN, [place] [place] [phone]                         Time Spent with Patient: 60 (04/07/15 1525)

## 2015-04-27 ENCOUNTER — Encounter: Payer: Self-pay | Admitting: *Deleted

## 2015-04-27 NOTE — Progress Notes (Signed)
[facility] Psychosocial Distress Screening Clinical Social Work  Clinical Social Work was referred by distress screening protocol.  The patient scored a 7 on the Psychosocial Distress Thermometer which indicates severe distress. Clinical Social Worker reviewed chart to assess for distress and other psychosocial needs. Pt not scheduled for future treatment, thus no need for future follow up at this time.   ONCBCN DISTRESS SCREENING 04/07/2015  Screening Type Initial Screening  Distress experienced in past week (1-10) 7  Emotional problem type Depression;Boredom  Spiritual/Religous concerns type Loss of sense of purpose  Information Concerns Type Lack of info about treatment  Physical Problem type Pain;Sleep/insomnia;Changes in urination;Loss of appetitie;Swollen arms/legs  Physician notified of physical symptoms Yes  Referral to clinical psychology No  Referral to clinical social work Yes  Referral to dietition No  Referral to financial advocate No  Referral to support programs No  Referral to palliative care No     Clinical Social Worker follow up needed: No.  If yes, follow up plan: Loren Racer, [facility] Phone: [phone] Fax: [phone]

## 2015-05-26 ENCOUNTER — Encounter: Payer: Self-pay | Admitting: Neurology

## 2015-05-26 ENCOUNTER — Ambulatory Visit (INDEPENDENT_AMBULATORY_CARE_PROVIDER_SITE_OTHER): Payer: Medicare Other | Admitting: Neurology

## 2015-05-26 VITALS — BP 124/72 | HR 66 | Ht 64.0 in | Wt 157.0 lb

## 2015-05-26 DIAGNOSIS — E785 Hyperlipidemia, unspecified: Secondary | ICD-10-CM | POA: Diagnosis not present

## 2015-05-26 DIAGNOSIS — I10 Essential (primary) hypertension: Secondary | ICD-10-CM

## 2015-05-26 DIAGNOSIS — F0390 Unspecified dementia without behavioral disturbance: Secondary | ICD-10-CM | POA: Diagnosis not present

## 2015-05-26 DIAGNOSIS — F03A0 Unspecified dementia, mild, without behavioral disturbance, psychotic disturbance, mood disturbance, and anxiety: Secondary | ICD-10-CM

## 2015-05-26 NOTE — Patient Instructions (Signed)
1. Continue all your medications 2. Physical exercise and brain stimulation exercises (crossword puzzles, word search, etc) are important for brain health 3. Follow-up in 1 year, call for any changes

## 2015-05-26 NOTE — Progress Notes (Addendum)
NEUROLOGY FOLLOW UP OFFICE NOTE  Lance Kemp [id number]  HISTORY OF PRESENT ILLNESS: I had the pleasure of seeing Lance Kemp in follow-up in the neurology clinic on 05/26/2015. He was last seen 6 months ago for mild dementia and is again accompanied by his son who helps supplement the history. MMSE in March 2016 was 23/30. He reports doing very well, memory is described as "the best I can do." He has infrequent headaches, no dizziness, focal numbness/tingling/weakness. He has chronic shoulder pain on the left. He had tripped last August, no falls since then. His son feels memory is better, he is happier now that he has his own room in the SNF.   HPI: This is a pleasant 81 yo RH man with a history of hypertension, hyperlipidemia, chronic shoulder pain, with memory loss that became noticeable when he moved to [place] in 2014. He feels his memory is "not too good." He cites instances where he is introduced to someone, then he walks 15 feet away and forgets their name. He misplaces things frequently. He has had some word-finding difficulties. He had been living in [place] by himself, with his son visiting him annually except in 2013. On his visit in 2012, his father was doing absolutely fine. He had a TIA in 2013 when he fell, and when his son came next in August 2014, he noticed a huge difference. His doctor told him not to drive after the TIA in 2013. He denied getting lost driving, and denied any missed bills or medications. He moved to [place] to be closer to his son in September 2014. His son has noticed several things, his short term memory is not good, he cannot recall what he ate prior, or their discussion the day prior, appointments, the day of the week. He seems confused on place and time, and would refer to going down the hallway at his facility as "going downstairs" or the lobby as the "living room" or "family room." His long-term memory is wonderful. His speech is fine, but he has a hard  time finding the correct words to describe a location. He does not focus on a discussion, and can drift off easily when distracted. He needs help with bathing but can dress himself without difficulty. He was started on Aricept which caused significant diarrhea that resolved after this was discontinued.  Diagnostic Data: I personally reviewed MRI brain without contrast done 08/2014 which showed prominence of the ventricular system, slightly out of proportion to the degree of diffuse atrophy; moderate to severe chronic microvascular disease, no acute changes. He does not have any symptoms of normal pressure hydrocephalus, he denies any urinary incontinence, no magnetic gait.  PAST MEDICAL HISTORY: Past Medical History  Diagnosis Date  . Osteoarthritis     hands/wrists  . Depression     antidepressant x 1 yr--situational. Resolved 08/2013 per pt/son.  . Glaucoma   . Hypertension   . 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  No significant residual deficit except short term memory impairment  . Urine incontinence   . Frequent headaches   . Chronic venous insufficiency     compression hose  . Peripheral neuropathy ([place]) 2013    Dx'd by neuro in [place] at the time the patient was hospitalized briefly for [place] CVA.  . NSTEMI (non-ST elevated myocardial infarction) ([place]) 06/2011    [place]: Troponin mildly elevated, normal EKG, normal ECHO, pt declined cardiology consult--per old PCP records.  . Basal cell carcinoma 2011; 2016  Face  . Chronic renal insufficiency, stage III (moderate) 2013    CrCl in the 50s  . Chronic diastolic heart failure ([place])   . Chronic left shoulder pain 09/14/2013  . Normocytic anemia 2015    secondary to CRI (iron studies ok 12/2013)  . Shortness of breath   . Chronic back pain   . Dementia 2016    Dr. Delice Lesch 08/2014--started aricept    MEDICATIONS: Current Outpatient Prescriptions on File Prior to Visit  Medication Sig Dispense Refill  . aspirin 81  MG chewable tablet Chew 81 mg by mouth daily.    Marland Kitchen atorvastatin (LIPITOR) 20 MG tablet Take 20 mg by mouth every evening.     Marland Kitchen azelastine (OPTIVAR) 0.05 % ophthalmic solution Place 2 drops into both eyes 2 (two) times daily.    . baclofen (LIORESAL) 10 MG tablet Take 10 mg by mouth 3 (three) times daily as needed for muscle spasms.     . citalopram (CELEXA) 10 MG tablet Take 1 tablet (10 mg total) by mouth daily. 30 tablet 6  . finasteride (PROSCAR) 5 MG tablet Take 1 tablet (5 mg total) by mouth daily. 30 tablet 6  . furosemide (LASIX) 40 MG tablet Take 40 mg by mouth daily.    . hydrALAZINE (APRESOLINE) 10 MG tablet Take 10 mg by mouth 3 (three) times daily.     Marland Kitchen HYDROcodone-acetaminophen (NORCO/VICODIN) 5-325 MG per tablet 1 tab by mouth every twelve hours to be given only WHEN REQUESTED BY PATIENT. 60 tablet 0  . levobunolol (BETAGAN) 0.5 % ophthalmic solution Place 1 drop into both eyes 2 (two) times daily.    . tamsulosin (FLOMAX) 0.4 MG CAPS capsule Take 2 capsules (0.8 mg total) by mouth daily after supper. 60 capsule 6  . temazepam (RESTORIL) 7.5 MG capsule Take 1 capsule (7.5 mg total) by mouth at bedtime as needed for sleep. 30 capsule 5  . tolterodine (DETROL LA) 2 MG 24 hr capsule 1 cap po qhs (Patient taking differently: Take 2 mg by mouth daily. 1 cap po qhs) 30 capsule 1   No current facility-administered medications on file prior to visit.    ALLERGIES: Allergies  Allergen Reactions  . Altace [Ramipril] Other (See Comments)    As per old records from [place]  . Amoxicillin Other (See Comments)    Per old records from [place]  . Cozaar [Losartan Potassium] Other (See Comments)    As per old records from [place]: Family History  Problem Relation Age of Onset  . Cancer Mother   . Heart disease Mother     SOCIAL HISTORY: Social History   Social History  . Marital Status: Single    Spouse Name: N/A  . Number of Children: 1  . Years of  Education: N/A   Occupational History  . Retired    Social History Main Topics  . Smoking status: Former Smoker    Types: Pipe  . Smokeless tobacco: Never Used  . Alcohol Use: No  . Drug Use: No  . Sexual Activity: No   Other Topics Concern  . Not on file   Social History Narrative   Widower.   Relocated to [place] from [place] 02/2013 to live in [place] (falls while living alone led to this).   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   Pipe smoker until 2014.   Alcohol: social drinker until his 26s, then no alcohol.  REVIEW OF SYSTEMS: Constitutional: No fevers, chills, or sweats, no generalized fatigue, change in appetite Eyes: No visual changes, double vision, eye pain Ear, nose and throat: No hearing loss, ear pain, nasal congestion, sore throat Cardiovascular: No chest pain, palpitations Respiratory:  No shortness of breath at rest or with exertion, wheezes GastrointestinaI: No nausea, vomiting, diarrhea, abdominal pain, fecal incontinence Genitourinary:  No dysuria, urinary retention or frequency Musculoskeletal:  No neck pain, back pain. + shoulder pain Integumentary: No rash, pruritus, skin lesions Neurological: as above Psychiatric: No depression, insomnia, anxiety Endocrine: No palpitations, fatigue, diaphoresis, mood swings, change in appetite, change in weight, increased thirst Hematologic/Lymphatic:  No anemia, purpura, petechiae. Allergic/Immunologic: no itchy/runny eyes, nasal congestion, recent allergic reactions, rashes  PHYSICAL EXAM: Filed Vitals:   05/26/15 1506  BP: 124/72  Pulse: 66   General: No acute distress Head:  Normocephalic/atraumatic Neck: supple, no paraspinal tenderness, full range of motion Heart:  Regular rate and rhythm Lungs:  Clear to auscultation bilaterally Back: No paraspinal tenderness Skin/Extremities: No rash, no edema, he is wearing compression stockings Neurological Exam: alert and oriented  to person, place, and time. No aphasia or dysarthria. Fund of knowledge is appropriate.  Recent and remote memory are intact. 2/3 delayed recall.  Attention and concentration are normal.    Able to name objects and repeat phrases. CDT 4/5 MMSE - Mini Mental State Exam 05/26/2015 08/24/2014  Orientation to time 5 2  Orientation to Place 5 4  Registration 3 3  Attention/ Calculation 4 5  Recall 2 0  Language- name 2 objects 2 2  Language- repeat 1 1  Language- follow 3 step command 3 3  Language- read & follow direction 1 1  Write a sentence 1 1  Copy design 1 1  Total score 28 23   Cranial nerves: Pupils equal, round, reactive to light.  Fundoscopic exam unremarkable, no papilledema. Extraocular movements intact with no nystagmus. Visual fields full. Facial sensation intact. No facial asymmetry. Tongue, uvula, palate midline.  Motor: Bulk and tone normal, muscle strength 5/5 throughout with no pronator drift.  Sensation to light touch intact.  No extinction to double simultaneous stimulation.  Deep tendon reflexes 1+ throughout except for absent ankle jerks bilaterally, toes downgoing.  Finger to nose testing intact.  Gait slow and cautious with walker, no magnetic gait noted, no ataxia. Romberg negative.  IMPRESSION: This is a pleasant 81 yo RH man with a history of hypertension, hyperlipidemia, chronic shoulder pain, who presented with worsening memory and slight behavioral changes since 2014. MMSE in March 2016 was 23/30, indicating mild dementia. His MMSE today is much better, 28/30, his son attributes this to overall better mental health now that he has his own room without stress from roommates. We again discussed option of starting a different cholinesterase inhibitor such as Exelon patch (did not tolerate Aricept), however since he is doing so well, we have agreed to hold off for now.Continue control of vascular risk factors, physical exercise and brain stimulation exercises for brain health.  He will follow-up in 1 year or earlier if needed.   Thank you for allowing me to participate in his care.  Please do not hesitate to call for any questions or concerns.  The duration of this appointment visit was 25 minutes of face-to-face time with the patient.  Greater than 50% of this time was spent in counseling, explanation of diagnosis, planning of further management, and coordination of care.   Ellouise Newer, M.D.   CC:  Dr. Anitra Lauth

## 2015-05-27 ENCOUNTER — Ambulatory Visit (INDEPENDENT_AMBULATORY_CARE_PROVIDER_SITE_OTHER): Payer: Medicare Other | Admitting: Family Medicine

## 2015-05-27 ENCOUNTER — Encounter: Payer: Self-pay | Admitting: Family Medicine

## 2015-05-27 VITALS — BP 125/63 | HR 64 | Temp 97.6°F | Resp 16 | Ht 65.0 in | Wt 158.5 lb

## 2015-05-27 DIAGNOSIS — D649 Anemia, unspecified: Secondary | ICD-10-CM | POA: Diagnosis not present

## 2015-05-27 DIAGNOSIS — I872 Venous insufficiency (chronic) (peripheral): Secondary | ICD-10-CM | POA: Diagnosis not present

## 2015-05-27 DIAGNOSIS — N138 Other obstructive and reflux uropathy: Secondary | ICD-10-CM

## 2015-05-27 DIAGNOSIS — N183 Chronic kidney disease, stage 3 unspecified: Secondary | ICD-10-CM

## 2015-05-27 DIAGNOSIS — I10 Essential (primary) hypertension: Secondary | ICD-10-CM

## 2015-05-27 DIAGNOSIS — N401 Enlarged prostate with lower urinary tract symptoms: Secondary | ICD-10-CM

## 2015-05-27 DIAGNOSIS — G894 Chronic pain syndrome: Secondary | ICD-10-CM | POA: Diagnosis not present

## 2015-05-27 DIAGNOSIS — N3281 Overactive bladder: Secondary | ICD-10-CM

## 2015-05-27 LAB — CBC WITH DIFFERENTIAL/PLATELET
BASOS PCT: 0.6 % (ref 0.0–3.0)
Basophils Absolute: 0 10*3/uL (ref 0.0–0.1)
EOS PCT: 13.3 % — AB (ref 0.0–5.0)
Eosinophils Absolute: 1 10*3/uL — ABNORMAL HIGH (ref 0.0–0.7)
HCT: 33.2 % — ABNORMAL LOW (ref 39.0–52.0)
Hemoglobin: 11.1 g/dL — ABNORMAL LOW (ref 13.0–17.0)
LYMPHS ABS: 1.8 10*3/uL (ref 0.7–4.0)
Lymphocytes Relative: 23.2 % (ref 12.0–46.0)
MCHC: 33.6 g/dL (ref 30.0–36.0)
MCV: 91.8 fl (ref 78.0–100.0)
Monocytes Absolute: 0.9 10*3/uL (ref 0.1–1.0)
Monocytes Relative: 11.8 % (ref 3.0–12.0)
NEUTROS PCT: 51.1 % (ref 43.0–77.0)
Neutro Abs: 4 10*3/uL (ref 1.4–7.7)
Platelets: 197 10*3/uL (ref 150.0–400.0)
RBC: 3.62 Mil/uL — AB (ref 4.22–5.81)
RDW: 13.2 % (ref 11.5–15.5)
WBC: 7.8 10*3/uL (ref 4.0–10.5)

## 2015-05-27 LAB — COMPREHENSIVE METABOLIC PANEL
ALT: 17 U/L (ref 0–53)
AST: 22 U/L (ref 0–37)
Albumin: 3.3 g/dL — ABNORMAL LOW (ref 3.5–5.2)
Alkaline Phosphatase: 80 U/L (ref 39–117)
BUN: 24 mg/dL — AB (ref 6–23)
CO2: 30 mEq/L (ref 19–32)
Calcium: 8.6 mg/dL (ref 8.4–10.5)
Chloride: 104 mEq/L (ref 96–112)
Creatinine, Ser: 1.19 mg/dL (ref 0.40–1.50)
GFR: 61 mL/min (ref >60.00)
GLUCOSE: 103 mg/dL — AB (ref 70–99)
POTASSIUM: 4.3 meq/L (ref 3.5–5.1)
Sodium: 140 mEq/L (ref 135–145)
Total Bilirubin: 0.5 mg/dL (ref 0.2–1.2)
Total Protein: 5.7 g/dL — ABNORMAL LOW (ref 6.0–8.3)

## 2015-05-27 NOTE — Progress Notes (Signed)
Pre visit review using our clinic review tool, if applicable. No additional management support is needed unless otherwise documented below in the visit note. 

## 2015-05-27 NOTE — Progress Notes (Signed)
OFFICE VISIT  05/27/2015   CC:  Chief Complaint  Patient presents with  . Follow-up   HPI:    Patient is a 81 y.o. Caucasian male who presents for 3 mo f/u CRI stage III, chronic LE venous insufficiency, HTN.  Also has chronic LBP and chronic L shoulder pain.  He is on a ASA and statin for hx of MI as well as CVA.  Lipid levels have been normal.  Feeling well, much happier in his own private room at his ALF now! Has some small bleeding sores on both LL's lately.  Wearing compression hose still.  No change in chronic pain, is able to get his pain meds about 50% of the time he requests it.  Gets up to pee once at night. Drinking ensure bid. No longer feels depressed, and since he is in a much happier living situation he would like to ween off the citalopram.   Past Medical History  Diagnosis Date  . Osteoarthritis     hands/wrists  . Depression     antidepressant x 1 yr--situational. Resolved 08/2013 per pt/son.  . Glaucoma   . Hypertension   . 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  No significant residual deficit except short term memory impairment  . Urine incontinence   . Frequent headaches   . Chronic venous insufficiency     compression hose  . Peripheral neuropathy ([place]) 2013    Dx'd by neuro in [place] at the time the patient was hospitalized briefly for [place] CVA.  . NSTEMI (non-ST elevated myocardial infarction) ([place]) 06/2011    [place]: Troponin mildly elevated, normal EKG, normal ECHO, pt declined cardiology consult--per old PCP records.  . Basal cell carcinoma 2011; 2016    Face  . Chronic renal insufficiency, stage III (moderate) 2013    CrCl in the 50s  . Chronic diastolic heart failure ([place])   . Chronic left shoulder pain 09/14/2013  . Normocytic anemia 2015    secondary to CRI (iron studies ok 12/2013)  . Shortness of breath   . Chronic back pain   . Dementia 2016    Dr. Delice Lesch 08/2014--started aricept    Past Surgical History  Procedure  Laterality Date  . Cholecystectomy    . Tonsilectomy, adenoidectomy, bilateral myringotomy and tubes    . Transthoracic echocardiogram  12/18/13    Grade 1 diast dysf, o/w normal  . Nm vq lung scan ([facility] hx)  09/2014    Low risk    Outpatient Prescriptions Prior to Visit  Medication Sig Dispense Refill  . aspirin 81 MG chewable tablet Chew 81 mg by mouth daily.    Marland Kitchen atorvastatin (LIPITOR) 20 MG tablet Take 20 mg by mouth every evening.     Marland Kitchen azelastine (OPTIVAR) 0.05 % ophthalmic solution Place 2 drops into both eyes 2 (two) times daily.    . baclofen (LIORESAL) 10 MG tablet Take 10 mg by mouth 3 (three) times daily as needed for muscle spasms.     . citalopram (CELEXA) 10 MG tablet Take 1 tablet (10 mg total) by mouth daily. 30 tablet 6  . finasteride (PROSCAR) 5 MG tablet Take 1 tablet (5 mg total) by mouth daily. 30 tablet 6  . furosemide (LASIX) 40 MG tablet Take 40 mg by mouth daily.    . hydrALAZINE (APRESOLINE) 10 MG tablet Take 10 mg by mouth 3 (three) times daily.     Marland Kitchen HYDROcodone-acetaminophen (NORCO/VICODIN) 5-325 MG per tablet 1 tab by mouth every  twelve hours to be given only WHEN REQUESTED BY PATIENT. 60 tablet 0  . levobunolol (BETAGAN) 0.5 % ophthalmic solution Place 1 drop into both eyes 2 (two) times daily.    . tamsulosin (FLOMAX) 0.4 MG CAPS capsule Take 2 capsules (0.8 mg total) by mouth daily after supper. 60 capsule 6  . temazepam (RESTORIL) 7.5 MG capsule Take 1 capsule (7.5 mg total) by mouth at bedtime as needed for sleep. 30 capsule 5  . tolterodine (DETROL LA) 2 MG 24 hr capsule 1 cap po qhs (Patient taking differently: Take 2 mg by mouth daily. 1 cap po qhs) 30 capsule 1   No facility-administered medications prior to visit.    Allergies  Allergen Reactions  . Altace [Ramipril] Other (See Comments)    As per old records from [place]  . Amoxicillin Other (See Comments)    Per old records from [place]  . Cozaar [Losartan Potassium] Other (See Comments)     As per old records from [place]    ROS As per HPI  PE: Blood pressure 125/63, pulse 64, temperature 97.6 F (36.4 C), temperature source Oral, resp. rate 16, height 5\' 5"  (1.651 m), weight 158 lb 8 oz (71.895 kg), SpO2 95 %. Gen: Alert, well appearing.  Patient is oriented to person, place, time, and situation. AFFECT: pleasant, lucid thought and speech. CV: RRR, no m/r/g LUNGS: soft, dry insp crackles in both bases, good aeration, nonlabored resps. EXT: 2-3 + pitting edema in both LL's, without erythema or rash or weeping areas.  No areas of bleeding but he has a few very small excoriations/scabs on each LL.  No sign of dermatitis or cellulitis.  LABS:  Lab Results  Component Value Date   TSH 3.390 02/18/2014   Lab Results  Component Value Date   WBC 7.9 09/25/2014   HGB 11.6* 09/25/2014   HCT 34.0* 09/25/2014   MCV 86.6 09/25/2014   PLT 211 09/25/2014   Lab Results  Component Value Date   CREATININE 1.09 02/25/2015   BUN 24* 02/25/2015   NA 141 02/25/2015   K 4.2 02/25/2015   CL 103 02/25/2015   CO2 32 02/25/2015   Lab Results  Component Value Date   ALT 17 02/19/2014   AST 20 02/19/2014   ALKPHOS 78 02/19/2014   BILITOT 0.4 02/19/2014   Lab Results  Component Value Date   CHOL 141 10/24/2013   Lab Results  Component Value Date   HDL 60.60 10/24/2013   Lab Results  Component Value Date   LDLCALC 70 10/24/2013   Lab Results  Component Value Date   TRIG 51.0 10/24/2013   Lab Results  Component Value Date   CHOLHDL 2 10/24/2013   IMPRESSION AND PLAN:  1) Chronic LE venous insufficiency edema: stable.  No change in diuretic dosing. He has a few small excoriations and dry skin on LL's but no dermatitis and no tense edema. Check lytes/cr today.  2) CRI, stage III: lytes/cr check today.  3) Hx of MI and CVA: on statin for secondary prevention. Needs AST/ALT check today.  Tolerating statin well.  4) OAB and BPH: stable/well controlled on current  med regimen.  5) Chronic low back and L shoulder pain: stable, pain meds currently appropriate.  No new rx's given today.  6) Hx of depression: he feels great now, esp since living in a private room.  He and son ask if I can take him off citalopram and I agreed to do this: take 1/2  of 10mg  citalopram qd x 7d, then stop this med completely.  7) Normocytic anemia: f/u CBC today.  Iron studies have been normal.    8) HTN; The current medical regimen is effective;  continue present plan and medications.  An After Visit Summary was printed and given to the patient.  FOLLOW UP: Return in about 4 months (around 09/25/2015) for routine chronic illness f/u.

## 2015-05-28 ENCOUNTER — Encounter: Payer: Self-pay | Admitting: Family Medicine

## 2015-06-24 ENCOUNTER — Ambulatory Visit: Payer: Medicare Other | Admitting: Podiatry

## 2015-06-30 ENCOUNTER — Ambulatory Visit (INDEPENDENT_AMBULATORY_CARE_PROVIDER_SITE_OTHER): Payer: Medicare Other | Admitting: Ophthalmology

## 2015-07-07 DIAGNOSIS — Z7689 Persons encountering health services in other specified circumstances: Secondary | ICD-10-CM

## 2015-07-16 ENCOUNTER — Ambulatory Visit (INDEPENDENT_AMBULATORY_CARE_PROVIDER_SITE_OTHER): Payer: Medicare Other | Admitting: Ophthalmology

## 2015-07-27 ENCOUNTER — Telehealth: Payer: Self-pay | Admitting: Family Medicine

## 2015-07-27 ENCOUNTER — Encounter (HOSPITAL_COMMUNITY): Payer: Self-pay

## 2015-07-27 ENCOUNTER — Emergency Department (HOSPITAL_COMMUNITY): Payer: Medicare Other

## 2015-07-27 ENCOUNTER — Emergency Department (HOSPITAL_COMMUNITY)
Admission: EM | Admit: 2015-07-27 | Discharge: 2015-07-27 | Disposition: A | Discharge status: Home / Self Care | Payer: Medicare Other | Attending: Emergency Medicine | Admitting: Emergency Medicine

## 2015-07-27 DIAGNOSIS — H409 Unspecified glaucoma: Secondary | ICD-10-CM | POA: Insufficient documentation

## 2015-07-27 DIAGNOSIS — I129 Hypertensive chronic kidney disease with stage 1 through stage 4 chronic kidney disease, or unspecified chronic kidney disease: Secondary | ICD-10-CM | POA: Insufficient documentation

## 2015-07-27 DIAGNOSIS — Z880 Allergy status to penicillin: Secondary | ICD-10-CM | POA: Diagnosis not present

## 2015-07-27 DIAGNOSIS — Z87891 Personal history of nicotine dependence: Secondary | ICD-10-CM | POA: Diagnosis not present

## 2015-07-27 DIAGNOSIS — Z8673 Personal history of transient ischemic attack (TIA), and cerebral infarction without residual deficits: Secondary | ICD-10-CM | POA: Diagnosis not present

## 2015-07-27 DIAGNOSIS — G8929 Other chronic pain: Secondary | ICD-10-CM | POA: Insufficient documentation

## 2015-07-27 DIAGNOSIS — Z7982 Long term (current) use of aspirin: Secondary | ICD-10-CM | POA: Insufficient documentation

## 2015-07-27 DIAGNOSIS — R42 Dizziness and giddiness: Secondary | ICD-10-CM | POA: Diagnosis not present

## 2015-07-27 DIAGNOSIS — M1990 Unspecified osteoarthritis, unspecified site: Secondary | ICD-10-CM | POA: Insufficient documentation

## 2015-07-27 DIAGNOSIS — Z85828 Personal history of other malignant neoplasm of skin: Secondary | ICD-10-CM | POA: Diagnosis not present

## 2015-07-27 DIAGNOSIS — Z79899 Other long term (current) drug therapy: Secondary | ICD-10-CM | POA: Insufficient documentation

## 2015-07-27 DIAGNOSIS — R404 Transient alteration of awareness: Secondary | ICD-10-CM | POA: Diagnosis not present

## 2015-07-27 DIAGNOSIS — N183 Chronic kidney disease, stage 3 (moderate): Secondary | ICD-10-CM | POA: Diagnosis not present

## 2015-07-27 DIAGNOSIS — F329 Major depressive disorder, single episode, unspecified: Secondary | ICD-10-CM | POA: Insufficient documentation

## 2015-07-27 DIAGNOSIS — Z862 Personal history of diseases of the blood and blood-forming organs and certain disorders involving the immune mechanism: Secondary | ICD-10-CM | POA: Insufficient documentation

## 2015-07-27 DIAGNOSIS — F0390 Unspecified dementia without behavioral disturbance: Secondary | ICD-10-CM | POA: Diagnosis not present

## 2015-07-27 DIAGNOSIS — I5032 Chronic diastolic (congestive) heart failure: Secondary | ICD-10-CM | POA: Diagnosis not present

## 2015-07-27 DIAGNOSIS — R0602 Shortness of breath: Secondary | ICD-10-CM | POA: Insufficient documentation

## 2015-07-27 DIAGNOSIS — I252 Old myocardial infarction: Secondary | ICD-10-CM | POA: Insufficient documentation

## 2015-07-27 LAB — CBC
HCT: 33.3 % — ABNORMAL LOW (ref 39.0–52.0)
HEMOGLOBIN: 11 g/dL — AB (ref 13.0–17.0)
MCH: 29.6 pg (ref 26.0–34.0)
MCHC: 33 g/dL (ref 30.0–36.0)
MCV: 89.8 fL (ref 78.0–100.0)
Platelets: 221 10*3/uL (ref 150–400)
RBC: 3.71 MIL/uL — AB (ref 4.22–5.81)
RDW: 13.6 % (ref 11.5–15.5)
WBC: 7.2 10*3/uL (ref 4.0–10.5)

## 2015-07-27 LAB — BASIC METABOLIC PANEL
ANION GAP: 8 (ref 5–15)
BUN: 24 mg/dL — ABNORMAL HIGH (ref 6–20)
CALCIUM: 8.9 mg/dL (ref 8.9–10.3)
CHLORIDE: 104 mmol/L (ref 101–111)
CO2: 28 mmol/L (ref 22–32)
Creatinine, Ser: 1.19 mg/dL (ref 0.61–1.24)
GFR calc non Af Amer: 52 mL/min — ABNORMAL LOW (ref >60)
GLUCOSE: 108 mg/dL — AB (ref 65–99)
Potassium: 4.1 mmol/L (ref 3.5–5.1)
Sodium: 140 mmol/L (ref 135–145)

## 2015-07-27 LAB — I-STAT TROPONIN, ED: Troponin i, poc: 0 ng/mL (ref 0.00–0.08)

## 2015-07-27 MED ORDER — MECLIZINE HCL 25 MG PO TABS
25.0000 mg | ORAL_TABLET | Freq: Once | ORAL | Status: AC
  Administered 2015-07-27: 25 mg via ORAL
  Filled 2015-07-27: qty 1

## 2015-07-27 MED ORDER — MECLIZINE HCL 50 MG PO TABS: 50.0000 mg | ORAL_TABLET | Freq: Three times a day (TID) | ORAL | Status: DC | PRN

## 2015-07-27 NOTE — Telephone Encounter (Signed)
With his CV history he should be evaluated, sooner than later. This could be vertigo, dehydration, low BP or new CVA/TIA.

## 2015-07-27 NOTE — Discharge Instructions (Signed)
Benign Positional Vertigo °Vertigo is the feeling that you or your surroundings are moving when they are not. Benign positional vertigo is the most common form of vertigo. The cause of this condition is not serious (is benign). This condition is triggered by certain movements and positions (is positional). This condition can be dangerous if it occurs while you are doing something that could endanger you or others, such as driving.  °CAUSES °In many cases, the cause of this condition is not known. It may be caused by a disturbance in an area of the inner ear that helps your brain to sense movement and balance. This disturbance can be caused by a viral infection (labyrinthitis), head injury, or repetitive motion. °RISK FACTORS °This condition is more likely to develop in: °· Women. °· People who are 50 years of age or older. °SYMPTOMS °Symptoms of this condition usually happen when you move your head or your eyes in different directions. Symptoms may start suddenly, and they usually last for less than a minute. Symptoms may include: °· Loss of balance and falling. °· Feeling like you are spinning or moving. °· Feeling like your surroundings are spinning or moving. °· Nausea and vomiting. °· Blurred vision. °· Dizziness. °· Involuntary eye movement (nystagmus). °Symptoms can be mild and cause only slight annoyance, or they can be severe and interfere with daily life. Episodes of benign positional vertigo may return (recur) over time, and they may be triggered by certain movements. Symptoms may improve over time. °DIAGNOSIS °This condition is usually diagnosed by medical history and a physical exam of the head, neck, and ears. You may be referred to a health care provider who specializes in ear, nose, and throat (ENT) problems (otolaryngologist) or a provider who specializes in disorders of the nervous system (neurologist). You may have additional testing, including: °· MRI. °· A CT scan. °· Eye movement tests. Your  health care provider may ask you to change positions quickly while he or she watches you for symptoms of benign positional vertigo, such as nystagmus. Eye movement may be tested with an electronystagmogram (ENG), caloric stimulation, the Dix-Hallpike test, or the roll test. °· An electroencephalogram (EEG). This records electrical activity in your brain. °· Hearing tests. °TREATMENT °Usually, your health care provider will treat this by moving your head in specific positions to adjust your inner ear back to normal. Surgery may be needed in severe cases, but this is rare. In some cases, benign positional vertigo may resolve on its own in 2-4 weeks. °HOME CARE INSTRUCTIONS °Safety °· Move slowly. Avoid sudden body or head movements. °· Avoid driving. °· Avoid operating heavy machinery. °· Avoid doing any tasks that would be dangerous to you or others if a vertigo episode would occur. °· If you have trouble walking or keeping your balance, try using a cane for stability. If you feel dizzy or unstable, sit down right away. °· Return to your normal activities as told by your health care provider. Ask your health care provider what activities are safe for you. °General Instructions °· Take over-the-counter and prescription medicines only as told by your health care provider. °· Avoid certain positions or movements as told by your health care provider. °· Drink enough fluid to keep your urine clear or pale yellow. °· Keep all follow-up visits as told by your health care provider. This is important. °SEEK MEDICAL CARE IF: °· You have a fever. °· Your condition gets worse or you develop new symptoms. °· Your family or friends   notice any behavioral changes. °· Your nausea or vomiting gets worse. °· You have numbness or a "pins and needles" sensation. °SEEK IMMEDIATE MEDICAL CARE IF: °· You have difficulty speaking or moving. °· You are always dizzy. °· You faint. °· You develop severe headaches. °· You have weakness in your  legs or arms. °· You have changes in your hearing or vision. °· You develop a stiff neck. °· You develop sensitivity to light. °  °This information is not intended to replace advice given to you by your health care provider. Make sure you discuss any questions you have with your health care provider. °  °Document Released: 03/13/2006 Document Revised: 02/24/2015 Document Reviewed: 09/28/2014 °Elsevier Interactive Patient Education ©2016 [employer] ° °Dizziness °Dizziness is a common problem. It is a feeling of unsteadiness or light-headedness. You may feel like you are about to faint. Dizziness can lead to injury if you stumble or fall. Anyone can become dizzy, but dizziness is more common in older adults. This condition can be caused by a number of things, including medicines, dehydration, or illness. °HOME CARE INSTRUCTIONS °Taking these steps may help with your condition: °Eating and Drinking °· Drink enough fluid to keep your urine clear or pale yellow. This helps to keep you from becoming dehydrated. Try to drink more clear fluids, such as water. °· Do not drink alcohol. °· Limit your caffeine intake if directed by your health care provider. °· Limit your salt intake if directed by your health care provider. °Activity °· Avoid making quick movements. °¨ Rise slowly from chairs and steady yourself until you feel okay. °¨ In the morning, first sit up on the side of the bed. When you feel okay, stand slowly while you hold onto something until you know that your balance is fine. °· Move your legs often if you need to stand in one place for a long time. Tighten and relax your muscles in your legs while you are standing. °· Do not drive or operate heavy machinery if you feel dizzy. °· Avoid bending down if you feel dizzy. Place items in your home so that they are easy for you to reach without leaning over. °Lifestyle °· Do not use any tobacco products, including cigarettes, chewing tobacco, or electronic  cigarettes. If you need help quitting, ask your health care provider. °· Try to reduce your stress level, such as with yoga or meditation. Talk with your health care provider if you need help. °General Instructions °· Watch your dizziness for any changes. °· Take medicines only as directed by your health care provider. Talk with your health care provider if you think that your dizziness is caused by a medicine that you are taking. °· Tell a friend or a family member that you are feeling dizzy. If he or she notices any changes in your behavior, have this person call your health care provider. °· Keep all follow-up visits as directed by your health care provider. This is important. °SEEK MEDICAL CARE IF: °· Your dizziness does not go away. °· Your dizziness or light-headedness gets worse. °· You feel nauseous. °· You have reduced hearing. °· You have new symptoms. °· You are unsteady on your feet or you feel like the room is spinning. °SEEK IMMEDIATE MEDICAL CARE IF: °· You vomit or have diarrhea and are unable to eat or drink anything. °· You have problems talking, walking, swallowing, or using your arms, hands, or legs. °· You feel generally weak. °· You are not   thinking clearly or you have trouble forming sentences. It may take a friend or family member to notice this. °· You have chest pain, abdominal pain, shortness of breath, or sweating. °· Your vision changes. °· You notice any bleeding. °· You have a headache. °· You have neck pain or a stiff neck. °· You have a fever. °  °This information is not intended to replace advice given to you by your health care provider. Make sure you discuss any questions you have with your health care provider. °  °Document Released: 11/29/2000 Document Revised: 10/20/2014 Document Reviewed: 06/01/2014 °Elsevier Interactive Patient Education ©2016 [employer] Inc[employer] ° °

## 2015-07-27 NOTE — Telephone Encounter (Signed)
Pt's son calling with info re: patients current state. Has had a new onset of vertigo starting when he woke up this morning. Was not able to dress himself or keep a dental appointment. Feels like he is on a "roller coaster". Son says he was perfectly form last night.

## 2015-07-27 NOTE — ED Notes (Signed)
[facility]- pt coming from [facility]. Pt reports intermittent dizziness when walking X1 week. Pt is alert and oriented X4. Negative for orthostatic changes and unremarkable EKG. Pt is alert and oriented X4.

## 2015-07-27 NOTE — ED Notes (Signed)
Gerald Stabs, son, contacted for transport

## 2015-07-27 NOTE — Telephone Encounter (Signed)
Spoke to pts son and recommended that pt be seen at an urgent care since Dr. Anitra Lauth is out of the office this afternoon and Dr. Raoul Pitch did not have any openings. Pts son wants to know if it would be okay to just wait til tomorrow and see if pt improved. He stated that when pt sits up he gets really dizzy, room starts spinning and he feels like he's going to vomit. Any orders will need to be faxed to [facility]. Please advise. Thanks.

## 2015-07-27 NOTE — Telephone Encounter (Signed)
Spoke to pts son and he stated that he didn't think he could get pt in his car and transport him to ER. I advised him that I could call [facility] and see if they can get EMS out to take pt to ER, pts son agreed with this. I spoke to [place] and she stated that she will get EMS out to take pt to ER for evaluation. Gerald Stabs pts son was advised and voiced understanding.

## 2015-07-27 NOTE — ED Provider Notes (Signed)
CSN: [id number]     Arrival date & time 07/27/15  1540 History   First MD Initiated Contact with Patient 07/27/15 1545     Chief Complaint  Patient presents with  . Dizziness     (Consider location/radiation/quality/duration/timing/severity/associated sxs/prior Treatment) Patient is a 82 y.o. male presenting with dizziness. The history is provided by the patient.  Dizziness Quality:  Room spinning Severity:  Mild Onset quality:  Gradual Duration:  1 week Timing:  Intermittent Progression:  Waxing and waning Chronicity:  New Context: head movement and standing up   Relieved by:  Being still Worsened by:  Standing up Ineffective treatments:  None tried Associated symptoms: shortness of breath   Associated symptoms: no blood in stool, no chest pain, no headaches, no hearing loss, no nausea, no tinnitus and no vomiting   Risk factors: hx of stroke     Past Medical History  Diagnosis Date  . Osteoarthritis     hands/wrists  . Depression     antidepressant x 1 yr--situational. Resolved 08/2013 per pt/son.  . Glaucoma   . Hypertension   . 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  No significant residual deficit except short term memory impairment  . Urine incontinence   . Frequent headaches   . Chronic venous insufficiency     compression hose  . Peripheral neuropathy ([place]) 2013    Dx'd by neuro in [place] at the time the patient was hospitalized briefly for pontine CVA.  . NSTEMI (non-ST elevated myocardial infarction) ([place]) 06/2011    [place]: Troponin mildly elevated, normal EKG, normal ECHO, pt declined cardiology consult--per old PCP records.  . Basal cell carcinoma 2011; 2016    Face  . Chronic renal insufficiency, stage III (moderate) 2013    CrCl in the 50s  . Chronic diastolic heart failure ([place])   . Chronic left shoulder pain 09/14/2013  . Normocytic anemia 2015    secondary to CRI (iron studies ok 12/2013)  . Shortness of breath   . Chronic back pain   .  Dementia 2016    Dr. Delice Lesch 08/2014--started aricept but pt did not tolerate   Past Surgical History  Procedure Laterality Date  . Cholecystectomy    . Tonsilectomy, adenoidectomy, bilateral myringotomy and tubes    . Transthoracic echocardiogram  12/18/13    Grade 1 diast dysf, o/w normal  . Nm vq lung scan ([facility] hx)  09/2014    Low risk   Family History  Problem Relation Age of Onset  . Cancer Mother   . Heart disease Mother    Social History  Substance Use Topics  . Smoking status: Former Smoker    Types: Pipe  . Smokeless tobacco: Never Used  . Alcohol Use: No    Review of Systems  Constitutional: Negative for fever and chills.  HENT: Negative for congestion, hearing loss and tinnitus.   Eyes: Negative for visual disturbance.  Respiratory: Positive for shortness of breath.   Cardiovascular: Negative for chest pain.  Gastrointestinal: Negative for nausea, vomiting and blood in stool.  Genitourinary: Negative.   Musculoskeletal: Negative.   Skin: Positive for wound. Negative for pallor and rash.  Neurological: Positive for dizziness. Negative for headaches.      Allergies  Altace; Amoxicillin; and Cozaar  Home Medications   Prior to Admission medications   Medication Sig Start Date End Date Taking? Authorizing Provider  aspirin 81 MG chewable tablet Chew 81 mg by mouth daily.   Yes Historical Provider, MD  atorvastatin (LIPITOR) 20 MG tablet Take 20 mg by mouth every evening.  10/28/13  Yes Historical Provider, MD  azelastine (OPTIVAR) 0.05 % ophthalmic solution Place 2 drops into both eyes 2 (two) times daily.   Yes Historical Provider, MD  finasteride (PROSCAR) 5 MG tablet Take 1 tablet (5 mg total) by mouth daily. 01/28/14  Yes [name], MD  furosemide (LASIX) 40 MG tablet Take 40 mg by mouth daily.   Yes Historical Provider, MD  hydrALAZINE (APRESOLINE) 10 MG tablet Take 10 mg by mouth 3 (three) times daily.  08/12/13  Yes Historical Provider, MD   levobunolol (BETAGAN) 0.5 % ophthalmic solution Place 1 drop into both eyes 2 (two) times daily.   Yes Historical Provider, MD  tamsulosin (FLOMAX) 0.4 MG CAPS capsule Take 2 capsules (0.8 mg total) by mouth daily after supper. 01/02/14  Yes [name], MD  tolterodine (DETROL LA) 2 MG 24 hr capsule 1 cap po qhs Patient taking differently: Take 2 mg by mouth daily. 1 cap po qhs 10/29/14  Yes [name], MD  citalopram (CELEXA) 10 MG tablet Take 1 tablet (10 mg total) by mouth daily. Patient not taking: Reported on 07/27/2015 09/25/14   [name], MD  HYDROcodone-acetaminophen (NORCO/VICODIN) 5-325 MG per tablet 1 tab by mouth every twelve hours to be given only WHEN REQUESTED BY PATIENT. Patient not taking: Reported on 07/27/2015 10/15/14   [name], MD  meclizine (ANTIVERT) 50 MG tablet Take 1 tablet (50 mg total) by mouth 3 (three) times daily as needed for dizziness. 07/27/15   Heriberto Antigua, MD  temazepam (RESTORIL) 7.5 MG capsule Take 1 capsule (7.5 mg total) by mouth at bedtime as needed for sleep. Patient not taking: Reported on 07/27/2015 01/14/14   [name], MD   BP 121/83 mmHg  Pulse 32  Temp(Src) 97.9 F (36.6 C) (Oral)  Resp 23  SpO2 95% Physical Exam  Constitutional: He is oriented to person, place, and time. He appears well-developed and well-nourished. No distress.  HENT:  Head: Normocephalic and atraumatic.  Mouth/Throat: No oropharyngeal exudate.  Eyes: EOM are normal. Pupils are equal, round, and reactive to light. No scleral icterus.  Neck: Normal range of motion. Neck supple.  Cardiovascular: Normal rate, regular rhythm, normal heart sounds and intact distal pulses.   Pulmonary/Chest: Effort normal and breath sounds normal. He exhibits no tenderness.  Abdominal: Soft. He exhibits no distension. There is no tenderness. There is no rebound and no guarding.  Musculoskeletal: Normal range of motion. He exhibits edema (pitting edema to bilateral lower  extremities with a small nonhealing wound to the left first metatarsal). He exhibits no tenderness.  Neurological: He is alert and oriented to person, place, and time. He has normal strength. He displays no tremor. No cranial nerve deficit or sensory deficit. Coordination normal. GCS eye subscore is 4. GCS verbal subscore is 5. GCS motor subscore is 6.  Dix-Hallpike maneuver performed with positive symptom reproduction of room spinning with head tilted to the left without visible nystagmus  Skin: Skin is warm and dry. No rash noted. He is not diaphoretic. No erythema. No pallor.  Psychiatric: He has a normal mood and affect.  Nursing note and vitals reviewed.   ED Course  Procedures (including critical care time) Labs Review Labs Reviewed  CBC - Abnormal; Notable for the following:    RBC 3.71 (*)    Hemoglobin 11.0 (*)    HCT 33.3 (*)  All other components within normal limits  BASIC METABOLIC PANEL - Abnormal; Notable for the following:    Glucose, Bld 108 (*)    BUN 24 (*)    GFR calc non Af Amer 52 (*)    All other components within normal limits  Randolm Idol, ED    Imaging Review Mr Brain Wo Contrast  07/27/2015  CLINICAL DATA:  Vertigo. Intermittent dizziness while walking for 1 week. EXAM: MRI HEAD WITHOUT CONTRAST TECHNIQUE: Multiplanar, multiecho pulse sequences of the brain and surrounding structures were obtained without intravenous contrast. COMPARISON:  Head CT 12/21/2014 FINDINGS: Advanced disc degeneration is partially visualized in the cervical spine with partial fusion across the C4-5 disc space noted. Moderate ventriculomegaly is unchanged and is felt to reflect cerebral atrophy which is mildly centrally predominant rather than hydrocephalus. Confluent T2 hyperintensities in the cerebral white matter bilaterally and patchy T2 hyperintensities in the pons are nonspecific but compatible with rather extensive chronic small vessel ischemic disease. Prior bilateral  cataract extraction is noted. Paranasal sinuses and mastoid air cells are clear. Major intracranial vascular flow voids are preserved. IMPRESSION: 1. No acute intracranial abnormality. 2. Advanced cerebral atrophy and chronic small vessel ischemic disease. Electronically Signed   By: Logan Bores M.D.   On: 07/27/2015 19:39   I have personally reviewed and evaluated these images and lab results as part of my medical decision-making.   EKG Interpretation   Date/Time:  Tuesday July 27 2015 15:50:54 EST Ventricular Rate:  65 PR Interval:  153 QRS Duration: 98 QT Interval:  442 QTC Calculation: 460 R Axis:   -40 Text Interpretation:  Sinus rhythm Abnormal R-wave progression, early  transition Left ventricular hypertrophy Poor R wave progression No  significant change since last tracing Confirmed by LITTLE MD, RACHEL  [phone]) on 07/27/2015 7:40:30 PM      MDM   Final diagnoses:  Vertigo  Dizzy    Patient is a 82 year old male with a history of CVA, hypertension, CHF who presents with intermittent episodes of the room spinning for the past week worse when he gets up and changes positions suddenly. He denies any fevers, chills, headache, chest pain, or abdominal pain. Further history and exam as above notable for stable vital signs, negative orthostatics. Dix-Hallpike maneuver positive for reproduction of symptoms to the left but no nystagmus. BMP and CBC unremarkable. EKG without acute arrhythmia or ischemic changes. Likely vertigo but given history of brainstem strokes in the past MRI obtained which is negative for acute findings. Symptoms improved with meclizine here.  I have reviewed all imaging and blood work. Patient stable for discharge home.  I have reviewed all results with the patient. Advised to f/u with pcp for further symptomatic management. Will rx meclizine. Patient agrees to stated plan. All questions answered. Advised to call or return to have any questions, new symptoms,  change in symptoms, or symptoms that they do not understand.     Heriberto Antigua, MD 07/28/15 [id number]  Sharlett Iles, MD 07/29/15 [phone]

## 2015-07-29 DIAGNOSIS — H8110 Benign paroxysmal vertigo, unspecified ear: Secondary | ICD-10-CM | POA: Diagnosis not present

## 2015-07-29 DIAGNOSIS — I129 Hypertensive chronic kidney disease with stage 1 through stage 4 chronic kidney disease, or unspecified chronic kidney disease: Secondary | ICD-10-CM | POA: Diagnosis not present

## 2015-07-29 DIAGNOSIS — Z7982 Long term (current) use of aspirin: Secondary | ICD-10-CM | POA: Diagnosis not present

## 2015-07-29 DIAGNOSIS — I872 Venous insufficiency (chronic) (peripheral): Secondary | ICD-10-CM | POA: Diagnosis not present

## 2015-07-29 DIAGNOSIS — N183 Chronic kidney disease, stage 3 (moderate): Secondary | ICD-10-CM | POA: Diagnosis not present

## 2015-07-29 DIAGNOSIS — G609 Hereditary and idiopathic neuropathy, unspecified: Secondary | ICD-10-CM | POA: Diagnosis not present

## 2015-07-29 DIAGNOSIS — R2689 Other abnormalities of gait and mobility: Secondary | ICD-10-CM | POA: Diagnosis not present

## 2015-07-29 DIAGNOSIS — Z8673 Personal history of transient ischemic attack (TIA), and cerebral infarction without residual deficits: Secondary | ICD-10-CM | POA: Diagnosis not present

## 2015-07-29 DIAGNOSIS — Z9181 History of falling: Secondary | ICD-10-CM | POA: Diagnosis not present

## 2015-07-29 DIAGNOSIS — F329 Major depressive disorder, single episode, unspecified: Secondary | ICD-10-CM | POA: Diagnosis not present

## 2015-07-29 DIAGNOSIS — H409 Unspecified glaucoma: Secondary | ICD-10-CM | POA: Diagnosis not present

## 2015-07-29 DIAGNOSIS — M1990 Unspecified osteoarthritis, unspecified site: Secondary | ICD-10-CM | POA: Diagnosis not present

## 2015-07-30 ENCOUNTER — Ambulatory Visit (INDEPENDENT_AMBULATORY_CARE_PROVIDER_SITE_OTHER): Payer: Medicare Other | Admitting: Family Medicine

## 2015-07-30 ENCOUNTER — Encounter: Payer: Self-pay | Admitting: Family Medicine

## 2015-07-30 VITALS — BP 128/69 | HR 67 | Temp 97.6°F | Resp 16 | Ht 65.0 in | Wt 155.2 lb

## 2015-07-30 DIAGNOSIS — R42 Dizziness and giddiness: Secondary | ICD-10-CM | POA: Diagnosis not present

## 2015-07-30 DIAGNOSIS — L600 Ingrowing nail: Secondary | ICD-10-CM | POA: Diagnosis not present

## 2015-07-30 DIAGNOSIS — J209 Acute bronchitis, unspecified: Secondary | ICD-10-CM | POA: Diagnosis not present

## 2015-07-30 DIAGNOSIS — L03031 Cellulitis of right toe: Secondary | ICD-10-CM | POA: Diagnosis not present

## 2015-07-30 MED ORDER — DOXYCYCLINE HYCLATE 100 MG PO TABS: 100.0000 mg | ORAL_TABLET | Freq: Two times a day (BID) | ORAL | Status: DC

## 2015-07-30 NOTE — Progress Notes (Signed)
OFFICE VISIT  07/30/2015   CC:  Chief Complaint  Patient presents with  . Toe Pain    x 1 week   HPI:    Patient is a 82 y.o. Caucasian male who presents with his son Lance Kemp for pain and redness of right great toe that started a week ago--the area just proximal to the nail was the focal area involved. Has been doing soaks at his ALF as per my instructions and it has begun to feel/look better.  No fevers.  Some drainage was reported early on but none lately.  Has hypertrophic and deformed nail on that toe, some question of in-grown aspect to it.  About 3-4 d/a he began to develop episodic vertiginous sensation, sounds like lasts seconds to a few minutes at the most, assoc with nausea but no vomiting.  He doesn't know if it is triggered by head movement or posture change.  No focal neurologic complaint.  Had some muffled sound in R ear the days preceding onset of sx's.  No fevers.  No diarrhea.   Went to ED for the vertigo 07/27/15, [place] reproduced the sx's but no nystagmus observed.  MRI brain w/out contrast showed no acute finding.  Now it sounds like he has started some vestibular PT at his ALF. He feels improved and has not been having the vertigo episodes nearly as much, but feels a general sense of disequilibrium a lot of the time lately.    Has had about 2 wk of coughing illness, some PND as well, says this has been improved the last couple of days.  No fevers or SOB or wheezing.      Past Medical History  Diagnosis Date  . Osteoarthritis     hands/wrists  . Depression     antidepressant x 1 yr--situational. Resolved 08/2013 per pt/son.  . Glaucoma   . Hypertension   . 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  No significant residual deficit except short term memory impairment  . Urine incontinence   . Frequent headaches   . Chronic venous insufficiency     compression hose  . Peripheral neuropathy ([place]) 2013    Dx'd by neuro in [place] at the time the patient was  hospitalized briefly for pontine CVA.  . NSTEMI (non-ST elevated myocardial infarction) ([place]) 06/2011    [place]: Troponin mildly elevated, normal EKG, normal ECHO, pt declined cardiology consult--per old PCP records.  . Basal cell carcinoma 2011; 2016    Face  . Chronic renal insufficiency, stage III (moderate) 2013    CrCl in the 50s  . Chronic diastolic heart failure ([place])   . Chronic left shoulder pain 09/14/2013  . Normocytic anemia 2015    secondary to CRI (iron studies ok 12/2013)  . Shortness of breath   . Chronic back pain   . Dementia 2016    Dr. Delice Lesch 08/2014--started aricept but pt did not tolerate    Past Surgical History  Procedure Laterality Date  . Cholecystectomy    . Tonsilectomy, adenoidectomy, bilateral myringotomy and tubes    . Transthoracic echocardiogram  12/18/13    Grade 1 diast dysf, o/w normal  . Nm vq lung scan ([facility] hx)  09/2014    Low risk    Outpatient Prescriptions Prior to Visit  Medication Sig Dispense Refill  . aspirin 81 MG chewable tablet Chew 81 mg by mouth daily.    Marland Kitchen atorvastatin (LIPITOR) 20 MG tablet Take 20 mg by mouth every evening.     Marland Kitchen  azelastine (OPTIVAR) 0.05 % ophthalmic solution Place 2 drops into both eyes 2 (two) times daily.    . finasteride (PROSCAR) 5 MG tablet Take 1 tablet (5 mg total) by mouth daily. 30 tablet 6  . furosemide (LASIX) 40 MG tablet Take 40 mg by mouth daily.    . hydrALAZINE (APRESOLINE) 10 MG tablet Take 10 mg by mouth 3 (three) times daily.     Marland Kitchen HYDROcodone-acetaminophen (NORCO/VICODIN) 5-325 MG per tablet 1 tab by mouth every twelve hours to be given only WHEN REQUESTED BY PATIENT. 60 tablet 0  . levobunolol (BETAGAN) 0.5 % ophthalmic solution Place 1 drop into both eyes 2 (two) times daily.    . meclizine (ANTIVERT) 50 MG tablet Take 1 tablet (50 mg total) by mouth 3 (three) times daily as needed for dizziness. 8 tablet 0  . tamsulosin (FLOMAX) 0.4 MG CAPS capsule Take 2 capsules (0.8 mg total) by mouth  daily after supper. 60 capsule 6  . tolterodine (DETROL LA) 2 MG 24 hr capsule 1 cap po qhs (Patient taking differently: Take 2 mg by mouth daily. 1 cap po qhs) 30 capsule 1  . citalopram (CELEXA) 10 MG tablet Take 1 tablet (10 mg total) by mouth daily. (Patient not taking: Reported on 07/27/2015) 30 tablet 6  . temazepam (RESTORIL) 7.5 MG capsule Take 1 capsule (7.5 mg total) by mouth at bedtime as needed for sleep. (Patient not taking: Reported on 07/27/2015) 30 capsule 5   No facility-administered medications prior to visit.    Allergies  Allergen Reactions  . Altace [Ramipril] Other (See Comments)    As per old records from [place]  . Amoxicillin Other (See Comments)    Per old records from [place]  . Cozaar [Losartan Potassium] Other (See Comments)    As per old records from [place]    ROS As per HPI  PE: Blood pressure 128/69, pulse 67, temperature 97.6 F (36.4 C), temperature source Oral, resp. rate 16, height 5\' 5"  (1.651 m), weight 155 lb 4 oz (70.421 kg), SpO2 98 %. Gen: Alert, well appearing.  Patient is oriented to person, place, time, and situation. ENT: PERRLA, EOMI, no nystagmus, no abnormal head thrust. Oropharynx: clear.  Nose clear. CV: RRR Chest is clear, no wheezing or rales. Normal symmetric air entry throughout both lung fields. No chest wall deformities or tenderness. EXT: 2+ pittinge edema bilat LEs Right great toe with a mild amount of erythema proximal to the nail fold, without any tenderness or fluctuance or swelling.  Mild tenderness at lateral aspect of toe near where the toenail is mildly in-grown but no swelling or erythema present there.  Nail is deformed and very thick.   Neuro: no focal deficit.  Dix-Halpike: negative with lying patient supine, but with sitting up pt (with head turned to either side) had vertigo with nausea but no nystagmus.  This lasted about 15-20 seconds each time.    LABS: None today   Chemistry      Component Value Date/Time    NA 140 07/27/2015 1615   K 4.1 07/27/2015 1615   CL 104 07/27/2015 1615   CO2 28 07/27/2015 1615   BUN 24* 07/27/2015 1615   CREATININE 1.19 07/27/2015 1615   CREATININE 1.44* 01/16/2014 1658      Component Value Date/Time   CALCIUM 8.9 07/27/2015 1615   ALKPHOS 80 05/27/2015 1444   AST 22 05/27/2015 1444   ALT 17 05/27/2015 1444   BILITOT 0.5 05/27/2015 1444  Lab Results  Component Value Date   WBC 7.2 07/27/2015   HGB 11.0* 07/27/2015   HCT 33.3* 07/27/2015   MCV 89.8 07/27/2015   PLT 221 07/27/2015   IMPRESSION AND PLAN:  1) Right great toe paronychium Improved with soaking.  Add doxycycline 100 mg bid x 10d, continue soaks. If this is resolved at f/u and he still has tenderness at area of ingrown toenail then I'll remove his toenail.  2) Vertigo: vestibular neuronitis vs BPPV. Agree with vestibular rehab. Meclizine prn.  3) Acute bronchitis; resolving.  No meds at this time.  An After Visit Summary was printed and given to the patient.  FOLLOW UP: Return for 30 min procedure visit: toenail removal.

## 2015-07-30 NOTE — Progress Notes (Signed)
Pre visit review using our clinic review tool, if applicable. No additional management support is needed unless otherwise documented below in the visit note. 

## 2015-08-03 DIAGNOSIS — F329 Major depressive disorder, single episode, unspecified: Secondary | ICD-10-CM | POA: Diagnosis not present

## 2015-08-03 DIAGNOSIS — R2689 Other abnormalities of gait and mobility: Secondary | ICD-10-CM | POA: Diagnosis not present

## 2015-08-03 DIAGNOSIS — M1990 Unspecified osteoarthritis, unspecified site: Secondary | ICD-10-CM | POA: Diagnosis not present

## 2015-08-03 DIAGNOSIS — N183 Chronic kidney disease, stage 3 (moderate): Secondary | ICD-10-CM | POA: Diagnosis not present

## 2015-08-03 DIAGNOSIS — H8110 Benign paroxysmal vertigo, unspecified ear: Secondary | ICD-10-CM | POA: Diagnosis not present

## 2015-08-03 DIAGNOSIS — I129 Hypertensive chronic kidney disease with stage 1 through stage 4 chronic kidney disease, or unspecified chronic kidney disease: Secondary | ICD-10-CM | POA: Diagnosis not present

## 2015-08-04 DIAGNOSIS — F329 Major depressive disorder, single episode, unspecified: Secondary | ICD-10-CM | POA: Diagnosis not present

## 2015-08-04 DIAGNOSIS — M1990 Unspecified osteoarthritis, unspecified site: Secondary | ICD-10-CM | POA: Diagnosis not present

## 2015-08-04 DIAGNOSIS — I129 Hypertensive chronic kidney disease with stage 1 through stage 4 chronic kidney disease, or unspecified chronic kidney disease: Secondary | ICD-10-CM | POA: Diagnosis not present

## 2015-08-04 DIAGNOSIS — H8110 Benign paroxysmal vertigo, unspecified ear: Secondary | ICD-10-CM | POA: Diagnosis not present

## 2015-08-04 DIAGNOSIS — R2689 Other abnormalities of gait and mobility: Secondary | ICD-10-CM | POA: Diagnosis not present

## 2015-08-04 DIAGNOSIS — N183 Chronic kidney disease, stage 3 (moderate): Secondary | ICD-10-CM | POA: Diagnosis not present

## 2015-08-06 ENCOUNTER — Telehealth: Payer: Self-pay | Admitting: *Deleted

## 2015-08-06 DIAGNOSIS — F329 Major depressive disorder, single episode, unspecified: Secondary | ICD-10-CM | POA: Diagnosis not present

## 2015-08-06 DIAGNOSIS — M1990 Unspecified osteoarthritis, unspecified site: Secondary | ICD-10-CM | POA: Diagnosis not present

## 2015-08-06 DIAGNOSIS — I129 Hypertensive chronic kidney disease with stage 1 through stage 4 chronic kidney disease, or unspecified chronic kidney disease: Secondary | ICD-10-CM | POA: Diagnosis not present

## 2015-08-06 DIAGNOSIS — R2689 Other abnormalities of gait and mobility: Secondary | ICD-10-CM | POA: Diagnosis not present

## 2015-08-06 DIAGNOSIS — N183 Chronic kidney disease, stage 3 (moderate): Secondary | ICD-10-CM | POA: Diagnosis not present

## 2015-08-06 DIAGNOSIS — H8110 Benign paroxysmal vertigo, unspecified ear: Secondary | ICD-10-CM | POA: Diagnosis not present

## 2015-08-06 NOTE — Telephone Encounter (Signed)
Lance Kemp with [facility] on 08/06/15 at 1:11pm stating that pt was suppose to have a face to face visit with Dr. Anitra Lauth for home health. I'm not sure if this was done. Please advise. Thanks. CB: [phone]

## 2015-08-09 DIAGNOSIS — I129 Hypertensive chronic kidney disease with stage 1 through stage 4 chronic kidney disease, or unspecified chronic kidney disease: Secondary | ICD-10-CM | POA: Diagnosis not present

## 2015-08-09 DIAGNOSIS — N183 Chronic kidney disease, stage 3 (moderate): Secondary | ICD-10-CM | POA: Diagnosis not present

## 2015-08-09 DIAGNOSIS — M1990 Unspecified osteoarthritis, unspecified site: Secondary | ICD-10-CM | POA: Diagnosis not present

## 2015-08-09 DIAGNOSIS — F329 Major depressive disorder, single episode, unspecified: Secondary | ICD-10-CM | POA: Diagnosis not present

## 2015-08-09 DIAGNOSIS — R2689 Other abnormalities of gait and mobility: Secondary | ICD-10-CM | POA: Diagnosis not present

## 2015-08-09 DIAGNOSIS — H8110 Benign paroxysmal vertigo, unspecified ear: Secondary | ICD-10-CM | POA: Diagnosis not present

## 2015-08-11 ENCOUNTER — Ambulatory Visit (INDEPENDENT_AMBULATORY_CARE_PROVIDER_SITE_OTHER): Payer: Medicare Other | Admitting: Family Medicine

## 2015-08-11 ENCOUNTER — Encounter: Payer: Self-pay | Admitting: Family Medicine

## 2015-08-11 VITALS — BP 126/69 | HR 66 | Temp 98.0°F | Resp 16 | Ht 65.0 in | Wt 153.5 lb

## 2015-08-11 DIAGNOSIS — I872 Venous insufficiency (chronic) (peripheral): Secondary | ICD-10-CM

## 2015-08-11 DIAGNOSIS — L600 Ingrowing nail: Secondary | ICD-10-CM

## 2015-08-11 DIAGNOSIS — M1711 Unilateral primary osteoarthritis, right knee: Secondary | ICD-10-CM

## 2015-08-11 DIAGNOSIS — J209 Acute bronchitis, unspecified: Secondary | ICD-10-CM

## 2015-08-11 DIAGNOSIS — M129 Arthropathy, unspecified: Secondary | ICD-10-CM

## 2015-08-11 DIAGNOSIS — L03031 Cellulitis of right toe: Secondary | ICD-10-CM

## 2015-08-11 DIAGNOSIS — N183 Chronic kidney disease, stage 3 unspecified: Secondary | ICD-10-CM

## 2015-08-11 NOTE — Progress Notes (Signed)
OFFICE VISIT  08/11/2015   CC:  Chief Complaint  Patient presents with  . Nail Problem    toenail removal     HPI:    Patient is a 82 y.o. Caucasian male who presents for 2 wk f/u right great toe paronychium and ingrown toenail. Soaked the toe and took 10d course of doxy, says it feels fine now. Has cough last couple weeks or so, getting more persistent.  No fever.  No body aches.  Some runny nose.  No ST or HA.  No wheezing or SOB. The vertigo is pretty much gone.  He has not started therapy for this yet and plans on telling therapist to hold off since it has resolved.    Last couple days his right knee has hurt: started in upper thigh area, hurts worst in knee, also some in lower leg. His LE edema has actually improved some lately in both LLs.      Past Medical History  Diagnosis Date  . Osteoarthritis     hands/wrists  . Depression     antidepressant x 1 yr--situational. Resolved 08/2013 per pt/son.  . Glaucoma   . Hypertension   . 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  No significant residual deficit except short term memory impairment  . Urine incontinence   . Frequent headaches   . Chronic venous insufficiency     compression hose  . Peripheral neuropathy ([place]) 2013    Dx'd by neuro in [place] at the time the patient was hospitalized briefly for pontine CVA.  . NSTEMI (non-ST elevated myocardial infarction) ([place]) 06/2011    [place]: Troponin mildly elevated, normal EKG, normal ECHO, pt declined cardiology consult--per old PCP records.  . Basal cell carcinoma 2011; 2016    Face  . Chronic renal insufficiency, stage III (moderate) 2013    CrCl in the 50s  . Chronic diastolic heart failure ([place])   . Chronic left shoulder pain 09/14/2013  . Normocytic anemia 2015    secondary to CRI (iron studies ok 12/2013)  . Shortness of breath   . Chronic back pain   . Dementia 2016    Dr. Delice Lesch 08/2014--started aricept but pt did not tolerate    Past Surgical  History  Procedure Laterality Date  . Cholecystectomy    . Tonsilectomy, adenoidectomy, bilateral myringotomy and tubes    . Transthoracic echocardiogram  12/18/13    Grade 1 diast dysf, o/w normal  . Nm vq lung scan ([facility] hx)  09/2014    Low risk    Outpatient Prescriptions Prior to Visit  Medication Sig Dispense Refill  . aspirin 81 MG chewable tablet Chew 81 mg by mouth daily.    Marland Kitchen atorvastatin (LIPITOR) 20 MG tablet Take 20 mg by mouth every evening.     Marland Kitchen azelastine (OPTIVAR) 0.05 % ophthalmic solution Place 2 drops into both eyes 2 (two) times daily.    . finasteride (PROSCAR) 5 MG tablet Take 1 tablet (5 mg total) by mouth daily. 30 tablet 6  . furosemide (LASIX) 40 MG tablet Take 40 mg by mouth daily.    . hydrALAZINE (APRESOLINE) 10 MG tablet Take 10 mg by mouth 3 (three) times daily.     Marland Kitchen HYDROcodone-acetaminophen (NORCO/VICODIN) 5-325 MG per tablet 1 tab by mouth every twelve hours to be given only WHEN REQUESTED BY PATIENT. 60 tablet 0  . levobunolol (BETAGAN) 0.5 % ophthalmic solution Place 1 drop into both eyes 2 (two) times daily.    Marland Kitchen  meclizine (ANTIVERT) 50 MG tablet Take 1 tablet (50 mg total) by mouth 3 (three) times daily as needed for dizziness. 8 tablet 0  . tamsulosin (FLOMAX) 0.4 MG CAPS capsule Take 2 capsules (0.8 mg total) by mouth daily after supper. 60 capsule 6  . tolterodine (DETROL LA) 2 MG 24 hr capsule 1 cap po qhs (Patient taking differently: Take 2 mg by mouth daily. 1 cap po qhs) 30 capsule 1  . doxycycline (VIBRA-TABS) 100 MG tablet Take 1 tablet (100 mg total) by mouth 2 (two) times daily. 20 tablet 0   No facility-administered medications prior to visit.    Allergies  Allergen Reactions  . Altace [Ramipril] Other (See Comments)    As per old records from [place]  . Amoxicillin Other (See Comments)    Per old records from [place]  . Cozaar [Losartan Potassium] Other (See Comments)    As per old records from [place]    ROS As per  HPI  PE: Blood pressure 126/69, pulse 66, temperature 98 F (36.7 C), temperature source Oral, resp. rate 16, height 5\' 5"  (1.651 m), weight 153 lb 8 oz (69.627 kg), SpO2 96 %. Gen: Alert, well appearing.  Patient is oriented to person, place, time, and situation. AFFECT: pleasant, lucid thought and speech. CV: RRR, distant S1 and S2, no audible murmur/rub LUNGS: bibasilar early insp crackles and these clear somewhat after he has taken several deep breaths.  Otherwise lungs are clear, good aeration, nonlabored resps. Right leg: no thigh tenderness or redness or cord, no popliteal or calf tenderness, redness, or cord. He has mild TTP in anterior region of R knee and mild pain in R knee anterior aspect with flexion/extension of R knee.  LE's: pitting edema is 1-2+ at the most. Right great toe without any erythema, swelling, warmth, or tenderness.  Pushing on his nail where it is in-grown causes no pain.  LABS:    Chemistry      Component Value Date/Time   NA 140 07/27/2015 1615   K 4.1 07/27/2015 1615   CL 104 07/27/2015 1615   CO2 28 07/27/2015 1615   BUN 24* 07/27/2015 1615   CREATININE 1.19 07/27/2015 1615   CREATININE 1.44* 01/16/2014 1658      Component Value Date/Time   CALCIUM 8.9 07/27/2015 1615   ALKPHOS 80 05/27/2015 1444   AST 22 05/27/2015 1444   ALT 17 05/27/2015 1444   BILITOT 0.5 05/27/2015 1444       IMPRESSION AND PLAN:  1) Paronychium and ingrown toenail (right great toe): resolved s/p abx and soaks.  2) Acute bronchitis. Prednisone 40mg  qd x 5d. Azith x 5d.  3) Right knee arthritis flare: hopefully the 5d course of prednisone for his bronchitis will help his knee.    4) Chronic venous insufficiency edema: improved/stable.  5) CRI stage III: most recent Cr 07/27/15 was 1.19.  An After Visit Summary was printed and given to the patient.  FOLLOW UP: Return in about 3 months (around 11/08/2015) for routine chronic illness f/u.

## 2015-08-11 NOTE — Progress Notes (Signed)
Pre visit review using our clinic review tool, if applicable. No additional management support is needed unless otherwise documented below in the visit note. 

## 2015-08-31 ENCOUNTER — Ambulatory Visit: Payer: Medicare Other | Admitting: Sports Medicine

## 2015-09-07 ENCOUNTER — Encounter: Payer: Self-pay | Admitting: Sports Medicine

## 2015-09-07 ENCOUNTER — Ambulatory Visit (INDEPENDENT_AMBULATORY_CARE_PROVIDER_SITE_OTHER): Payer: Medicare Other | Admitting: Sports Medicine

## 2015-09-07 DIAGNOSIS — M79671 Pain in right foot: Secondary | ICD-10-CM | POA: Diagnosis not present

## 2015-09-07 DIAGNOSIS — B351 Tinea unguium: Secondary | ICD-10-CM

## 2015-09-07 DIAGNOSIS — M79672 Pain in left foot: Secondary | ICD-10-CM

## 2015-09-07 DIAGNOSIS — I739 Peripheral vascular disease, unspecified: Secondary | ICD-10-CM

## 2015-09-07 NOTE — Progress Notes (Signed)
Patient ID: Lance Kemp, male   DOB: May 22, 1923, 82 y.o.   MRN: [id number] Subjective: Lance Kemp is a 82 y.o. male patient seen today in office with complaint of painful thickened and elongated toenails; unable to trim. Patient denies history of Diabetes or Neuropathy. Admits to Vascular disease/edema in legs uses compression stockings. Patient is assisted by son at this visit. Patient has no other pedal complaints at this time.   Patient Active Problem List   Diagnosis Date Noted  . Basal cell carcinoma, face 04/07/2015  . Mild dementia 11/24/2014  . OAB (overactive bladder) 10/17/2014  . Essential hypertension 08/25/2014  . Hyperlipidemia 08/25/2014  . Peripheral edema 04/16/2014  . Chronic diastolic heart failure ([place]) 03/04/2014  . Chronic renal insufficiency, stage III (moderate)   . Chronic shoulder pain 02/19/2014  . SOB (shortness of breath) 02/18/2014  . Acute on chronic diastolic heart failure ([place]) 12/24/2013  . Lumbago 12/18/2013  . History of CVA (cerebrovascular accident) 11/21/2013  . BPH associated with nocturia 11/21/2013  . Short-term memory loss 10/24/2013  . Chronic left shoulder pain 09/14/2013  . Venous insufficiency of both lower extremities 09/14/2013  . HTN (hypertension), benign 09/14/2013    Current Outpatient Prescriptions on File Prior to Visit  Medication Sig Dispense Refill  . aspirin 81 MG chewable tablet Chew 81 mg by mouth daily.    Marland Kitchen atorvastatin (LIPITOR) 20 MG tablet Take 20 mg by mouth every evening.     Marland Kitchen azelastine (OPTIVAR) 0.05 % ophthalmic solution Place 2 drops into both eyes 2 (two) times daily.    . finasteride (PROSCAR) 5 MG tablet Take 1 tablet (5 mg total) by mouth daily. 30 tablet 6  . furosemide (LASIX) 40 MG tablet Take 40 mg by mouth daily.    . hydrALAZINE (APRESOLINE) 10 MG tablet Take 10 mg by mouth 3 (three) times daily.     Marland Kitchen HYDROcodone-acetaminophen (NORCO/VICODIN) 5-325 MG per tablet 1 tab by mouth every twelve hours  to be given only WHEN REQUESTED BY PATIENT. 60 tablet 0  . levobunolol (BETAGAN) 0.5 % ophthalmic solution Place 1 drop into both eyes 2 (two) times daily.    . meclizine (ANTIVERT) 50 MG tablet Take 1 tablet (50 mg total) by mouth 3 (three) times daily as needed for dizziness. 8 tablet 0  . tamsulosin (FLOMAX) 0.4 MG CAPS capsule Take 2 capsules (0.8 mg total) by mouth daily after supper. 60 capsule 6  . tolterodine (DETROL LA) 2 MG 24 hr capsule 1 cap po qhs (Patient taking differently: Take 2 mg by mouth daily. 1 cap po qhs) 30 capsule 1   No current facility-administered medications on file prior to visit.    Allergies  Allergen Reactions  . Altace [Ramipril] Other (See Comments)    As per old records from [place]  . Amoxicillin Other (See Comments)    Per old records from [place]  . Cozaar [Losartan Potassium] Other (See Comments)    As per old records from [place]    Objective: Physical Exam  General: Well developed, nourished, no acute distress, awake, alert and oriented x 3  Vascular: Dorsalis pedis artery 1/4 bilateral, Posterior tibial artery 0/4 bilateral, +1 pitting edema bilateral, skin temperature warm to slightly cool proximal to distal bilateral lower extremities, + varicosities, no pedal hair present bilateral.  Neurological: Gross sensation present via light touch bilateral.   Dermatological: Skin is warm, dry, and supple bilateral, Nails 1-10 are tender, long, thick, and discolored with mild subungal debris, no  webspace macerations present bilateral, no open lesions present bilateral, no callus/corns/hyperkeratotic tissue present bilateral. No signs of infection bilateral.  Musculoskeletal: Mild asymptomatic hammertoe boney deformities noted bilateral. Muscular strength within normal limits without painon range of motion. No pain with calf compression bilateral.  Assessment and Plan:  Problem List Items Addressed This Visit    None    Visit Diagnoses     Onychomycosis    -  Primary    Foot pain, bilateral        PVD (peripheral vascular disease) ([place])           -Examined patient.  -Discussed treatment options for painful mycotic nails. -Mechanically debrided and reduced mycotic nails with sterile nail nipper and dremel nail file without incident. -Recommend cont with compression stockings and lower leg elevation to assist with edema control -Recommend continue with good supportive shoes for foot type -Patient to return in 3 months for follow up nail care/ evaluation or sooner if symptoms worsen.  Landis Martins, DPM

## 2015-09-21 DIAGNOSIS — D485 Neoplasm of uncertain behavior of skin: Secondary | ICD-10-CM | POA: Diagnosis not present

## 2015-09-21 DIAGNOSIS — C4491 Basal cell carcinoma of skin, unspecified: Secondary | ICD-10-CM | POA: Diagnosis not present

## 2015-09-21 DIAGNOSIS — L570 Actinic keratosis: Secondary | ICD-10-CM | POA: Diagnosis not present

## 2015-09-22 DIAGNOSIS — C4442 Squamous cell carcinoma of skin of scalp and neck: Secondary | ICD-10-CM | POA: Diagnosis not present

## 2015-09-24 ENCOUNTER — Encounter: Payer: Self-pay | Admitting: Family Medicine

## 2015-09-24 ENCOUNTER — Ambulatory Visit (INDEPENDENT_AMBULATORY_CARE_PROVIDER_SITE_OTHER): Payer: Medicare Other | Admitting: Family Medicine

## 2015-09-24 VITALS — BP 147/61 | HR 61 | Temp 97.6°F | Resp 16 | Ht 65.0 in | Wt 154.0 lb

## 2015-09-24 DIAGNOSIS — N183 Chronic kidney disease, stage 3 unspecified: Secondary | ICD-10-CM

## 2015-09-24 DIAGNOSIS — R3915 Urgency of urination: Secondary | ICD-10-CM | POA: Diagnosis not present

## 2015-09-24 DIAGNOSIS — I872 Venous insufficiency (chronic) (peripheral): Secondary | ICD-10-CM | POA: Diagnosis not present

## 2015-09-24 DIAGNOSIS — N3281 Overactive bladder: Secondary | ICD-10-CM

## 2015-09-24 DIAGNOSIS — I10 Essential (primary) hypertension: Secondary | ICD-10-CM

## 2015-09-24 LAB — POCT URINALYSIS DIPSTICK
Bilirubin, UA: NEGATIVE
Blood, UA: NEGATIVE
Glucose, UA: NEGATIVE
KETONES UA: NEGATIVE
Leukocytes, UA: NEGATIVE
Nitrite, UA: NEGATIVE
PH UA: 7
PROTEIN UA: NEGATIVE
SPEC GRAV UA: 1.015
Urobilinogen, UA: 0.2

## 2015-09-24 LAB — BASIC METABOLIC PANEL
BUN: 22 mg/dL (ref 7–25)
CALCIUM: 8.8 mg/dL (ref 8.6–10.3)
CO2: 29 mmol/L (ref 20–31)
Chloride: 102 mmol/L (ref 98–110)
Creat: 1.12 mg/dL — ABNORMAL HIGH (ref 0.70–1.11)
GLUCOSE: 80 mg/dL (ref 65–99)
Potassium: 3.9 mmol/L (ref 3.5–5.3)
SODIUM: 140 mmol/L (ref 135–146)

## 2015-09-24 MED ORDER — MIRABEGRON ER 25 MG PO TB24: 25.0000 mg | ORAL_TABLET | Freq: Every day | ORAL | Status: DC

## 2015-09-24 NOTE — Progress Notes (Signed)
Pre visit review using our clinic review tool, if applicable. No additional management support is needed unless otherwise documented below in the visit note. 

## 2015-09-24 NOTE — Progress Notes (Signed)
OFFICE VISIT  09/24/2015   CC:  Chief Complaint  Patient presents with  . Follow-up    Pt is not fasting.    HPI:    Patient is a 82 y.o. Caucasian male who presents accompanied by his son for f/u chronic venous insufficiency, HTN, and CRI stage III.  C/o gradual onset again of having to get up and urinate at night, 2 times usually.   No pain.  Still going frequently during daytime---"more than I used to".  No gross hematuria.  ROS: no HAs, no dizziness, no SOB, no fevers, no CP, no abd pain. Appetite is fine.  He drinks 3 ensures per day.  No report of any bp's taken at his ALF.   His son feels like he has been getting his meds as rx'd.  Past Medical History  Diagnosis Date  . Osteoarthritis     hands/wrists  . Depression     antidepressant x 1 yr--situational. Resolved 08/2013 per pt/son.  . Glaucoma   . Hypertension   . 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  No significant residual deficit except short term memory impairment  . Urine incontinence   . Frequent headaches   . Chronic venous insufficiency     compression hose  . Peripheral neuropathy ([place]) 2013    Dx'd by neuro in [place] at the time the patient was hospitalized briefly for [place]ine CVA.  . NSTEMI (non-ST elevated myocardial infarction) ([place]) 06/2011    [place]: Troponin mildly elevated, normal EKG, normal ECHO, pt declined cardiology consult--per old PCP records.  . Basal cell carcinoma 2011; 2016    Face  . Chronic renal insufficiency, stage III (moderate) 2013    CrCl in the 50s  . Chronic diastolic heart failure ([place])   . Chronic left shoulder pain 09/14/2013  . Normocytic anemia 2015    secondary to CRI (iron studies ok 12/2013)  . Shortness of breath   . Chronic back pain   . Dementia 2016    Dr. Delice Lesch 08/2014--started aricept but pt did not tolerate    Past Surgical History  Procedure Laterality Date  . Cholecystectomy    . Tonsilectomy, adenoidectomy, bilateral myringotomy and tubes     . Transthoracic echocardiogram  12/18/13    Grade 1 diast dysf, o/w normal  . Nm vq lung scan ([facility] hx)  09/2014    Low risk    Outpatient Prescriptions Prior to Visit  Medication Sig Dispense Refill  . aspirin 81 MG chewable tablet Chew 81 mg by mouth daily.    Marland Kitchen atorvastatin (LIPITOR) 20 MG tablet Take 20 mg by mouth every evening.     Marland Kitchen azelastine (OPTIVAR) 0.05 % ophthalmic solution Place 2 drops into both eyes 2 (two) times daily.    . finasteride (PROSCAR) 5 MG tablet Take 1 tablet (5 mg total) by mouth daily. 30 tablet 6  . furosemide (LASIX) 40 MG tablet Take 40 mg by mouth daily.    . hydrALAZINE (APRESOLINE) 10 MG tablet Take 10 mg by mouth 3 (three) times daily.     Marland Kitchen HYDROcodone-acetaminophen (NORCO/VICODIN) 5-325 MG per tablet 1 tab by mouth every twelve hours to be given only WHEN REQUESTED BY PATIENT. 60 tablet 0  . levobunolol (BETAGAN) 0.5 % ophthalmic solution Place 1 drop into both eyes 2 (two) times daily.    . meclizine (ANTIVERT) 50 MG tablet Take 1 tablet (50 mg total) by mouth 3 (three) times daily as needed for dizziness. 8 tablet 0  .  tamsulosin (FLOMAX) 0.4 MG CAPS capsule Take 2 capsules (0.8 mg total) by mouth daily after supper. 60 capsule 6  . tolterodine (DETROL LA) 2 MG 24 hr capsule 1 cap po qhs (Patient taking differently: Take 2 mg by mouth daily. 1 cap po qhs) 30 capsule 1   No facility-administered medications prior to visit.    Allergies  Allergen Reactions  . Altace [Ramipril] Other (See Comments)    As per old records from [place]  . Amoxicillin Other (See Comments)    Per old records from [place]  . Cozaar [Losartan Potassium] Other (See Comments)    As per old records from [place]    ROS As per HPI  PE: Blood pressure 147/61, pulse 61, temperature 97.6 F (36.4 C), temperature source Oral, resp. rate 16, height 5\' 5"  (1.651 m), weight 154 lb (69.854 kg), SpO2 99 %. Gen: Alert, well appearing.  Patient is oriented to person, place,  time, and situation. CV: RRR, distant S1 and S2, no audible m/r/g LUNGS: bibasilar harsh/dry insp crackles c/w his usual breath sounds, nonlabored resps, good aeration in all lung fields. EXT: 2+ pitting edema R LL, 1+ pitting edema Left LL, pt wearing compression hose. No skin breakdown, no weeping, no distention. Marland Kitchen  LABS:  Lab Results  Component Value Date   TSH 3.390 02/18/2014   Lab Results  Component Value Date   WBC 7.2 07/27/2015   HGB 11.0* 07/27/2015   HCT 33.3* 07/27/2015   MCV 89.8 07/27/2015   PLT 221 07/27/2015   Lab Results  Component Value Date   CREATININE 1.19 07/27/2015   BUN 24* 07/27/2015   NA 140 07/27/2015   K 4.1 07/27/2015   CL 104 07/27/2015   CO2 28 07/27/2015   Lab Results  Component Value Date   ALT 17 05/27/2015   AST 22 05/27/2015   ALKPHOS 80 05/27/2015   BILITOT 0.5 05/27/2015   Lab Results  Component Value Date   CHOL 141 10/24/2013   Lab Results  Component Value Date   HDL 60.60 10/24/2013   Lab Results  Component Value Date   LDLCALC 70 10/24/2013   Lab Results  Component Value Date   TRIG 51.0 10/24/2013   Lab Results  Component Value Date   CHOLHDL 2 10/24/2013   Lab Results  Component Value Date   HGBA1C 5.9 03/19/2014   CC UA today: normal.   IMPRESSION AND PLAN:  1) Chronic LE venous insufficiency edema: stable. Check lytes today.  Continue current therapies.  2) HTN; The current medical regimen is effective;  continue present plan and medications. Lytes/cr today.  3) CRI, stage 3: BMET today.  4) OAB: we discussed the fact that he may have to accept the current state of daytime and nighttime symptoms from his OAB and BPH.  However, we decided today to try myrbetriq 25mg  qd and d/c his Detrol LA 2mg  qd.  Continue flomax for his BPH.  His UA showed no sign of UTI today.  An After Visit Summary was printed and given to the patient.  FOLLOW UP: No Follow-up on file.  Signed:  Crissie Sickles, MD            09/24/2015

## 2015-10-07 DIAGNOSIS — G44219 Episodic tension-type headache, not intractable: Secondary | ICD-10-CM | POA: Diagnosis not present

## 2015-11-03 ENCOUNTER — Ambulatory Visit (INDEPENDENT_AMBULATORY_CARE_PROVIDER_SITE_OTHER): Payer: Medicare Other | Admitting: Family Medicine

## 2015-11-03 ENCOUNTER — Encounter: Payer: Self-pay | Admitting: Family Medicine

## 2015-11-03 VITALS — BP 151/55 | HR 62 | Temp 97.5°F | Resp 16 | Ht 65.0 in | Wt 154.0 lb

## 2015-11-03 DIAGNOSIS — K2100 Gastro-esophageal reflux disease with esophagitis, without bleeding: Secondary | ICD-10-CM

## 2015-11-03 DIAGNOSIS — R1310 Dysphagia, unspecified: Secondary | ICD-10-CM | POA: Diagnosis not present

## 2015-11-03 DIAGNOSIS — R351 Nocturia: Secondary | ICD-10-CM | POA: Diagnosis not present

## 2015-11-03 DIAGNOSIS — N401 Enlarged prostate with lower urinary tract symptoms: Secondary | ICD-10-CM | POA: Diagnosis not present

## 2015-11-03 DIAGNOSIS — N3281 Overactive bladder: Secondary | ICD-10-CM

## 2015-11-03 MED ORDER — POLYETHYLENE GLYCOL 3350 17 GM/SCOOP PO POWD: ORAL | Status: DC

## 2015-11-03 MED ORDER — PANTOPRAZOLE SODIUM 40 MG PO TBEC: 40.0000 mg | DELAYED_RELEASE_TABLET | Freq: Every day | ORAL | Status: DC

## 2015-11-03 NOTE — Progress Notes (Signed)
OFFICE VISIT  11/03/2015   CC:  Chief Complaint  Patient presents with  . Follow-up    OAB adn BPH   HPI:    Patient is a 82 y.o. Caucasian male who presents for 6 week f/u OAB + BPH with lower urinary tract obstructive sx's. Last visit I d/c'd his detrol LA and started myrbetriq 25mg  qd.  He says he is no better regarding daytime symptoms:  He has urinary frequency and urgency, occ incontinence.  Stream is "start and stop" type of stream.  However, night-time symptoms are improved: Nocturia x 1.    C/o about 1 mo of constant feeling of something in back of throat, worse after eating.  +Dysphagia.  Rare regurgitation of solid food.  Thin and thick liquids actually feel good and cause no problems. He does feel heartburn/excessive burping rather frequently.  No n/v.  Appetite is good.  No pain with swallowing.  ROS: some hard, difficult to pass stools.  No rectal pain bleeding.  Past Medical History  Diagnosis Date  . Osteoarthritis     hands/wrists  . Depression     antidepressant x 1 yr--situational. Resolved 08/2013 per pt/son.  . Glaucoma   . Hypertension   . 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  No significant residual deficit except short term memory impairment  . Urine incontinence   . Frequent headaches   . Chronic venous insufficiency     compression hose  . Peripheral neuropathy ([place]) 2013    Dx'd by neuro in [place] at the time the patient was hospitalized briefly for pontine CVA.  . NSTEMI (non-ST elevated myocardial infarction) ([place]) 06/2011    [place]: Troponin mildly elevated, normal EKG, normal ECHO, pt declined cardiology consult--per old PCP records.  . Basal cell carcinoma 2011; 2016    Face  . Chronic renal insufficiency, stage III (moderate) 2013    CrCl in the 50s  . Chronic diastolic heart failure ([place])   . Chronic left shoulder pain 09/14/2013  . Normocytic anemia 2015    secondary to CRI (iron studies ok 12/2013)  . Shortness of breath   .  Chronic back pain   . Dementia 2016    Dr. Delice Lesch 08/2014--started aricept but pt did not tolerate  . OAB (overactive bladder)   . BPH with obstruction/lower urinary tract symptoms     Past Surgical History  Procedure Laterality Date  . Cholecystectomy    . Tonsilectomy, adenoidectomy, bilateral myringotomy and tubes    . Transthoracic echocardiogram  12/18/13    Grade 1 diast dysf, o/w normal  . Nm vq lung scan ([facility] hx)  09/2014    Low risk    Outpatient Prescriptions Prior to Visit  Medication Sig Dispense Refill  . aspirin 81 MG chewable tablet Chew 81 mg by mouth daily.    Marland Kitchen atorvastatin (LIPITOR) 20 MG tablet Take 20 mg by mouth every evening.     Marland Kitchen azelastine (OPTIVAR) 0.05 % ophthalmic solution Place 2 drops into both eyes 2 (two) times daily.    . finasteride (PROSCAR) 5 MG tablet Take 1 tablet (5 mg total) by mouth daily. 30 tablet 6  . furosemide (LASIX) 40 MG tablet Take 40 mg by mouth daily.    . hydrALAZINE (APRESOLINE) 10 MG tablet Take 10 mg by mouth 3 (three) times daily.     Marland Kitchen HYDROcodone-acetaminophen (NORCO/VICODIN) 5-325 MG per tablet 1 tab by mouth every twelve hours to be given only WHEN REQUESTED BY PATIENT. [place]  tablet 0  . levobunolol (BETAGAN) 0.5 % ophthalmic solution Place 1 drop into both eyes 2 (two) times daily.    . meclizine (ANTIVERT) 50 MG tablet Take 1 tablet (50 mg total) by mouth 3 (three) times daily as needed for dizziness. 8 tablet 0  . mirabegron ER (MYRBETRIQ) 25 MG TB24 tablet Take 1 tablet (25 mg total) by mouth daily. 30 tablet 12  . tamsulosin (FLOMAX) 0.4 MG CAPS capsule Take 2 capsules (0.8 mg total) by mouth daily after supper. 60 capsule 6   No facility-administered medications prior to visit.    Allergies  Allergen Reactions  . Altace [Ramipril] Other (See Comments)    As per old records from [place]  . Amoxicillin Other (See Comments)    Per old records from [place]  . Cozaar [Losartan Potassium] Other (See Comments)    As per  old records from [place]    ROS As per HPI  PE: Blood pressure 151/55, pulse 62, temperature 97.5 F (36.4 C), temperature source Oral, resp. rate 16, height 5\' 5"  ([id number] m), weight 154 lb (69.854 kg), SpO2 98 %. Gen: Alert, well appearing.  Patient is oriented to person, place, time, and situation. AFFECT: pleasant, lucid thought and speech. [id number]: no injection, icteris, swelling, or exudate.  EOMI, PERRLA. Mouth: lips without lesion/swelling.  Oral mucosa pink and moist. Oropharynx without erythema, exudate, or swelling.  Neck: no mass, tenderness, or thyromegaly   LABS:    Chemistry      Component Value Date/Time   NA 140 09/24/2015 1447   K 3.9 09/24/2015 1447   CL 102 09/24/2015 1447   CO2 29 09/24/2015 1447   BUN 22 09/24/2015 1447   CREATININE 1.12* 09/24/2015 1447   CREATININE 1.19 07/27/2015 1615      Component Value Date/Time   CALCIUM 8.8 09/24/2015 1447   ALKPHOS 80 05/27/2015 1444   AST 22 05/27/2015 1444   ALT 17 05/27/2015 1444   BILITOT 0.5 05/27/2015 1444       IMPRESSION AND PLAN:  1) OAB + BPH: stable daytime, better night-time.  We'll keep things the way they are with his meds.  2) GERD with esophagitis, possible early stricture symptoms. Start pantoprazole 40mg  qd.  3) Constipation: start miralax powder qd--rx given today.  An After Visit Summary was printed and given to the patient.  FOLLOW UP: Return in about 2 months (around 01/03/2016) for f/u GERD/dysphagia.  Signed:  Crissie Sickles, MD           11/03/2015

## 2015-11-03 NOTE — Progress Notes (Signed)
Pre visit review using our clinic review tool, if applicable. No additional management support is needed unless otherwise documented below in the visit note. 

## 2015-12-02 DIAGNOSIS — C4442 Squamous cell carcinoma of skin of scalp and neck: Secondary | ICD-10-CM | POA: Diagnosis not present

## 2015-12-08 DIAGNOSIS — C4442 Squamous cell carcinoma of skin of scalp and neck: Secondary | ICD-10-CM | POA: Diagnosis not present

## 2015-12-14 ENCOUNTER — Ambulatory Visit (INDEPENDENT_AMBULATORY_CARE_PROVIDER_SITE_OTHER): Payer: Medicare Other | Admitting: Sports Medicine

## 2015-12-14 ENCOUNTER — Encounter: Payer: Self-pay | Admitting: Sports Medicine

## 2015-12-14 DIAGNOSIS — B351 Tinea unguium: Secondary | ICD-10-CM

## 2015-12-14 DIAGNOSIS — M79671 Pain in right foot: Secondary | ICD-10-CM | POA: Diagnosis not present

## 2015-12-14 DIAGNOSIS — M79672 Pain in left foot: Secondary | ICD-10-CM

## 2015-12-14 DIAGNOSIS — I739 Peripheral vascular disease, unspecified: Secondary | ICD-10-CM

## 2015-12-14 NOTE — Progress Notes (Signed)
Patient ID: Lance Kemp, male   DOB: 1923-02-21, 82 y.o.   MRN: [id number]   Subjective: Lance Kemp is a 82 y.o. male patient seen today in office with complaint of painful thickened and elongated toenails; unable to trim. Patient denies any changes with medical history since last visit. Patient is assisted by son at this visit who admits only changes are medications added for constipation. Patient has no other pedal complaints at this time.   Patient Active Problem List   Diagnosis Date Noted  . Basal cell carcinoma, face 04/07/2015  . Mild dementia 11/24/2014  . OAB (overactive bladder) 10/17/2014  . Essential hypertension 08/25/2014  . Hyperlipidemia 08/25/2014  . Peripheral edema 04/16/2014  . Chronic diastolic heart failure ([place]) 03/04/2014  . Chronic renal insufficiency, stage III (moderate)   . Chronic shoulder pain 02/19/2014  . SOB (shortness of breath) 02/18/2014  . Acute on chronic diastolic heart failure (Lance Kemp) 12/24/2013  . Lumbago 12/18/2013  . History of CVA (cerebrovascular accident) 11/21/2013  . BPH associated with nocturia 11/21/2013  . Short-term memory loss 10/24/2013  . Chronic left shoulder pain 09/14/2013  . Venous insufficiency of both lower extremities 09/14/2013  . HTN (hypertension), benign 09/14/2013    Current Outpatient Prescriptions on File Prior to Visit  Medication Sig Dispense Refill  . aspirin 81 MG chewable tablet Chew 81 mg by mouth daily.    Marland Kitchen atorvastatin (LIPITOR) 20 MG tablet Take 20 mg by mouth every evening.     Marland Kitchen azelastine (OPTIVAR) 0.05 % ophthalmic solution Place 2 drops into both eyes 2 (two) times daily.    . finasteride (PROSCAR) 5 MG tablet Take 1 tablet (5 mg total) by mouth daily. 30 tablet 6  . furosemide (LASIX) 40 MG tablet Take 40 mg by mouth daily.    . hydrALAZINE (APRESOLINE) 10 MG tablet Take 10 mg by mouth 3 (three) times daily.     Marland Kitchen HYDROcodone-acetaminophen (NORCO/VICODIN) 5-325 MG per tablet 1 tab by mouth every  twelve hours to be given only WHEN REQUESTED BY PATIENT. 60 tablet 0  . levobunolol (BETAGAN) 0.5 % ophthalmic solution Place 1 drop into both eyes 2 (two) times daily.    . meclizine (ANTIVERT) 50 MG tablet Take 1 tablet (50 mg total) by mouth 3 (three) times daily as needed for dizziness. 8 tablet 0  . mirabegron ER (MYRBETRIQ) 25 MG TB24 tablet Take 1 tablet (25 mg total) by mouth daily. 30 tablet 12  . pantoprazole (PROTONIX) 40 MG tablet Take 1 tablet (40 mg total) by mouth daily. 30 tablet 6  . polyethylene glycol powder (GLYCOLAX/MIRALAX) powder 1 capful in 6 oz of water once daily 500 g 12  . tamsulosin (FLOMAX) 0.4 MG CAPS capsule Take 2 capsules (0.8 mg total) by mouth daily after supper. 60 capsule 6   No current facility-administered medications on file prior to visit.    Allergies  Allergen Reactions  . Altace [Ramipril] Other (See Comments)    As per old records from [place]  . Amoxicillin Other (See Comments)    Per old records from [place]  . Cozaar [Losartan Potassium] Other (See Comments)    As per old records from [place]    Objective: Physical Exam  General: Well developed, nourished, no acute distress, awake, alert and oriented x 3  Vascular: Dorsalis pedis artery 1/4 bilateral, Posterior tibial artery 0/4 bilateral, +1 pitting edema bilateral, skin temperature warm to slightly cool proximal to distal bilateral lower extremities, + varicosities, no pedal hair  present bilateral.  Neurological: Gross sensation present via light touch bilateral.   Dermatological: Skin is warm, dry, and supple bilateral, Nails 1-10 are tender, long, thick, and discolored with mild subungal debris, no webspace macerations present bilateral, no open lesions present bilateral, no callus/corns/hyperkeratotic tissue present bilateral. No signs of infection bilateral.  Musculoskeletal: Mild asymptomatic hammertoe boney deformities noted bilateral. Muscular strength within normal limits  without pain on range of motion. No pain with calf compression bilateral.  Assessment and Plan:  Problem List Items Addressed This Visit    None    Visit Diagnoses    Onychomycosis    -  Primary    Foot pain, bilateral        PVD (peripheral vascular disease) ([place])          -Examined patient.  -Discussed treatment options for painful mycotic nails. -Mechanically debrided and reduced mycotic nails with sterile nail nipper and dremel nail file without incident. -Recommend cont with compression stockings and lower leg elevation to assist with edema control -Recommend continue with good supportive shoes for foot type -Patient to return in 3 months for follow up nail care/ evaluation or sooner if symptoms worsen.  Landis Martins, DPM

## 2015-12-30 ENCOUNTER — Encounter: Payer: Self-pay | Admitting: *Deleted

## 2016-01-05 ENCOUNTER — Ambulatory Visit: Payer: Medicare Other | Admitting: Family Medicine

## 2016-01-12 ENCOUNTER — Ambulatory Visit: Payer: Medicare Other | Admitting: Family Medicine

## 2016-02-02 ENCOUNTER — Ambulatory Visit (INDEPENDENT_AMBULATORY_CARE_PROVIDER_SITE_OTHER): Payer: Medicare Other | Admitting: Family Medicine

## 2016-02-02 ENCOUNTER — Encounter: Payer: Self-pay | Admitting: Family Medicine

## 2016-02-02 VITALS — BP 147/64 | HR 62 | Temp 97.5°F | Resp 16 | Ht 65.0 in | Wt 154.2 lb

## 2016-02-02 DIAGNOSIS — N183 Chronic kidney disease, stage 3 unspecified: Secondary | ICD-10-CM

## 2016-02-02 DIAGNOSIS — R609 Edema, unspecified: Secondary | ICD-10-CM | POA: Diagnosis not present

## 2016-02-02 DIAGNOSIS — R600 Localized edema: Secondary | ICD-10-CM

## 2016-02-02 DIAGNOSIS — K219 Gastro-esophageal reflux disease without esophagitis: Secondary | ICD-10-CM | POA: Diagnosis not present

## 2016-02-02 DIAGNOSIS — G253 Myoclonus: Secondary | ICD-10-CM | POA: Diagnosis not present

## 2016-02-02 NOTE — Progress Notes (Signed)
Pre visit review using our clinic review tool, if applicable. No additional management support is needed unless otherwise documented below in the visit note. 

## 2016-02-02 NOTE — Progress Notes (Signed)
OFFICE VISIT  02/02/2016   CC:  Chief Complaint  Patient presents with  . Follow-up    GERD and Dysphagia   HPI:    Patient is a 82 y.o. Caucasian male who presents for 2 mo f/u GERD.  Started pantoprazole 40mg  qd last visit. Says he feels improved, wants to continue on the medication.  Reports about 2 wk hx of sudden jerking of his upper body/arms and he feels cold when it happens: "like a shiver". Happens multiple times per day, randomly (no triggers).  No focal weakness, no paresthesias, no HA's.  No periods of dystonia.  No loss of consciousness.  No episodes in which he jerks repetitively.  Denies ever having this problem before. He has been in more pain the last month or more from his shoulder and back, and often does not get the pain med he has ordered at his ALF.  Pt's son is with him today and says they are working on getting pt's [place] and living will all finished. Pt requests DNR form.  Apparently he already has one at the ALF, needs one to be carried with him when he travels outside the home.   Past Medical History:  Diagnosis Date  . Basal cell carcinoma 2011; 2016   Face  . BPH with obstruction/lower urinary tract symptoms   . Chronic back pain   . Chronic diastolic heart failure ([place])   . Chronic left shoulder pain 09/14/2013  . Chronic renal insufficiency, stage III (moderate) 2013   CrCl in the 50s  . Chronic venous insufficiency    compression hose  . 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  No significant residual deficit except short term memory impairment  . Dementia 2016   Dr. Delice Lesch 08/2014--started aricept but pt did not tolerate  . Depression    antidepressant x 1 yr--situational. Resolved 08/2013 per pt/son.  . Frequent headaches   . Glaucoma   . Hypertension   . Normocytic anemia 2015   secondary to CRI (iron studies ok 12/2013)  . NSTEMI (non-ST elevated myocardial infarction) ([place]) 06/2011   [place]: Troponin mildly elevated, normal EKG,  normal ECHO, pt declined cardiology consult--per old PCP records.  Marland Kitchen OAB (overactive bladder)   . Osteoarthritis    hands/wrists  . Peripheral neuropathy ([place]) 2013   Dx'd by neuro in [place] at the time the patient was hospitalized briefly for pontine CVA.  Marland Kitchen Shortness of breath   . Urine incontinence     Past Surgical History:  Procedure Laterality Date  . CHOLECYSTECTOMY    . NM VQ LUNG SCAN ([place] HX)  09/2014   Low risk  . TONSILECTOMY, ADENOIDECTOMY, BILATERAL MYRINGOTOMY AND TUBES    . TRANSTHORACIC ECHOCARDIOGRAM  12/18/13   Grade 1 diast dysf, o/w normal    Outpatient Medications Prior to Visit  Medication Sig Dispense Refill  . aspirin 81 MG chewable tablet Chew 81 mg by mouth daily.    Marland Kitchen atorvastatin (LIPITOR) 20 MG tablet Take 20 mg by mouth every evening.     Marland Kitchen azelastine (OPTIVAR) 0.05 % ophthalmic solution Place 2 drops into both eyes 2 (two) times daily.    . finasteride (PROSCAR) 5 MG tablet Take 1 tablet (5 mg total) by mouth daily. 30 tablet 6  . furosemide (LASIX) 40 MG tablet Take 40 mg by mouth daily.    . hydrALAZINE (APRESOLINE) 10 MG tablet Take 10 mg by mouth 3 (three) times daily.     Marland Kitchen HYDROcodone-acetaminophen (NORCO/VICODIN)  5-325 MG per tablet 1 tab by mouth every twelve hours to be given only WHEN REQUESTED BY PATIENT. 60 tablet 0  . levobunolol (BETAGAN) 0.5 % ophthalmic solution Place 1 drop into both eyes 2 (two) times daily.    . meclizine (ANTIVERT) 50 MG tablet Take 1 tablet (50 mg total) by mouth 3 (three) times daily as needed for dizziness. 8 tablet 0  . mirabegron ER (MYRBETRIQ) 25 MG TB24 tablet Take 1 tablet (25 mg total) by mouth daily. 30 tablet 12  . pantoprazole (PROTONIX) 40 MG tablet Take 1 tablet (40 mg total) by mouth daily. 30 tablet 6  . polyethylene glycol powder (GLYCOLAX/MIRALAX) powder 1 capful in 6 oz of water once daily 500 g 12  . tamsulosin (FLOMAX) 0.4 MG CAPS capsule Take 2 capsules (0.8 mg total) by mouth daily after  supper. 60 capsule 6   No facility-administered medications prior to visit.     Allergies  Allergen Reactions  . Altace [Ramipril] Other (See Comments)    As per old records from [place]  . Amoxicillin Other (See Comments)    Per old records from [place]  . Cozaar [Losartan Potassium] Other (See Comments)    As per old records from [place]    ROS As per HPI  PE: Blood pressure (!) 147/64, pulse 62, temperature 97.5 F (36.4 C), temperature source Oral, resp. rate 16, height 5\' 5"  (1.651 m), weight 154 lb 4 oz (70 kg), SpO2 93 %. Gen: Alert, well appearing.  Patient is oriented to person, place, time, and situation. AFFECT: pleasant, lucid thought and speech. On 2 occasions today I saw him have a brief, symmetric, upper body/arms myoclonus CV: RRR, distant S1 and s2, no audible murmur/rub/gallop. Chest is clear, no wheezing or rales. Normal symmetric air entry throughout both lung fields. No chest wall deformities or tenderness. EXT: 2+ LE pitting edema, R>L Neuro: CN 2-12 intact bilaterally, strength 5/5 in proximal and distal upper extremities and lower extremities bilaterally.   No tremor.    LABS:    Chemistry      Component Value Date/Time   NA 140 09/24/2015 1447   K 3.9 09/24/2015 1447   CL 102 09/24/2015 1447   CO2 29 09/24/2015 1447   BUN 22 09/24/2015 1447   CREATININE 1.12 (H) 09/24/2015 1447      Component Value Date/Time   CALCIUM 8.8 09/24/2015 1447   ALKPHOS 80 05/27/2015 1444   AST 22 05/27/2015 1444   ALT 17 05/27/2015 1444   BILITOT 0.5 05/27/2015 1444       IMPRESSION AND PLAN:  1) GERD, with dysphagia: improving gradually on daily pantoprazole 40mg .  Continue this.  2) Chronic LE venous insufficiency edema: stable.  Continue low Na diet, compression stockings, and daily lasix. Checking lytes today.  3) Myoclonus: mild.  Unclear etiology.  Will check BMET, Mag, phos. Watchful waiting approach for now.   If worsens then will consider  neuroimaging vs neurology referral.  4) CRI stage II/III: GFR low 60s usually.  Check lytes/cr today.  An After Visit Summary was printed and given to the patient.  FOLLOW UP: Return in about 3 months (around 05/04/2016) for routine chronic illness f/u.  Signed:  Crissie Sickles, MD           02/02/2016

## 2016-02-03 LAB — MAGNESIUM: Magnesium: 2.1 mg/dL (ref 1.5–2.5)

## 2016-02-03 LAB — BASIC METABOLIC PANEL
BUN: 18 mg/dL (ref 6–23)
CALCIUM: 9.1 mg/dL (ref 8.4–10.5)
CHLORIDE: 101 meq/L (ref 96–112)
CO2: 31 meq/L (ref 19–32)
Creatinine, Ser: 1.16 mg/dL (ref 0.40–1.50)
GFR: 62.73 mL/min (ref >60.00)
Glucose, Bld: 111 mg/dL — ABNORMAL HIGH (ref 70–99)
POTASSIUM: 4.1 meq/L (ref 3.5–5.1)
Sodium: 140 mEq/L (ref 135–145)

## 2016-02-03 LAB — PHOSPHORUS: PHOSPHORUS: 3.9 mg/dL (ref 2.3–4.6)

## 2016-03-01 DIAGNOSIS — L570 Actinic keratosis: Secondary | ICD-10-CM | POA: Diagnosis not present

## 2016-03-01 DIAGNOSIS — C44310 Basal cell carcinoma of skin of unspecified parts of face: Secondary | ICD-10-CM | POA: Diagnosis not present

## 2016-03-01 DIAGNOSIS — C4442 Squamous cell carcinoma of skin of scalp and neck: Secondary | ICD-10-CM | POA: Diagnosis not present

## 2016-03-01 DIAGNOSIS — D485 Neoplasm of uncertain behavior of skin: Secondary | ICD-10-CM | POA: Diagnosis not present

## 2016-03-14 ENCOUNTER — Other Ambulatory Visit: Payer: Self-pay | Admitting: Family Medicine

## 2016-03-14 ENCOUNTER — Ambulatory Visit: Payer: Medicare Other | Admitting: Sports Medicine

## 2016-03-14 MED ORDER — HYDROCODONE-ACETAMINOPHEN 5-325 MG PO TABS: ORAL_TABLET | ORAL | 0 refills | Status: DC

## 2016-03-16 DIAGNOSIS — C44311 Basal cell carcinoma of skin of nose: Secondary | ICD-10-CM | POA: Diagnosis not present

## 2016-03-28 ENCOUNTER — Encounter: Payer: Self-pay | Admitting: Sports Medicine

## 2016-03-28 ENCOUNTER — Ambulatory Visit (INDEPENDENT_AMBULATORY_CARE_PROVIDER_SITE_OTHER): Payer: Medicare Other | Admitting: Sports Medicine

## 2016-03-28 DIAGNOSIS — M79671 Pain in right foot: Secondary | ICD-10-CM | POA: Diagnosis not present

## 2016-03-28 DIAGNOSIS — B351 Tinea unguium: Secondary | ICD-10-CM

## 2016-03-28 DIAGNOSIS — M79672 Pain in left foot: Secondary | ICD-10-CM

## 2016-03-28 DIAGNOSIS — I739 Peripheral vascular disease, unspecified: Secondary | ICD-10-CM

## 2016-03-28 NOTE — Progress Notes (Signed)
Patient ID: Lance Kemp, male   DOB: 21-Apr-1923, 82 y.o.   MRN: [id number]   Subjective: Lance Kemp is a 82 y.o. male patient seen today in office with complaint of painful thickened and elongated toenails; unable to trim. Patient denies any changes with medical history since last visit. Patient is assisted by son at this visit who admits only changes are medications added for constipation which was noted prior. Patient has no other pedal complaints at this time.   Patient Active Problem List   Diagnosis Date Noted  . Basal cell carcinoma, face 04/07/2015  . Mild dementia 11/24/2014  . OAB (overactive bladder) 10/17/2014  . Essential hypertension 08/25/2014  . Hyperlipidemia 08/25/2014  . Peripheral edema 04/16/2014  . Chronic diastolic heart failure ([place]) 03/04/2014  . Chronic renal insufficiency, stage III (moderate)   . Chronic shoulder pain 02/19/2014  . SOB (shortness of breath) 02/18/2014  . Acute on chronic diastolic heart failure ([place]) 12/24/2013  . Lumbago 12/18/2013  . History of CVA (cerebrovascular accident) 11/21/2013  . BPH associated with nocturia 11/21/2013  . Short-term memory loss 10/24/2013  . Chronic left shoulder pain 09/14/2013  . Venous insufficiency of both lower extremities 09/14/2013  . HTN (hypertension), benign 09/14/2013    Current Outpatient Prescriptions on File Prior to Visit  Medication Sig Dispense Refill  . aspirin 81 MG chewable tablet Chew 81 mg by mouth daily.    Marland Kitchen atorvastatin (LIPITOR) 20 MG tablet Take 20 mg by mouth every evening.     Marland Kitchen azelastine (OPTIVAR) 0.05 % ophthalmic solution Place 2 drops into both eyes 2 (two) times daily.    . finasteride (PROSCAR) 5 MG tablet Take 1 tablet (5 mg total) by mouth daily. 30 tablet 6  . furosemide (LASIX) 40 MG tablet Take 40 mg by mouth daily.    . hydrALAZINE (APRESOLINE) 10 MG tablet Take 10 mg by mouth 3 (three) times daily.     Marland Kitchen HYDROcodone-acetaminophen (NORCO/VICODIN) 5-325 MG tablet 1  tab by mouth every twelve hours to be given only WHEN REQUESTED BY PATIENT. 60 tablet 0  . levobunolol (BETAGAN) 0.5 % ophthalmic solution Place 1 drop into both eyes 2 (two) times daily.    . meclizine (ANTIVERT) 50 MG tablet Take 1 tablet (50 mg total) by mouth 3 (three) times daily as needed for dizziness. 8 tablet 0  . mirabegron ER (MYRBETRIQ) 25 MG TB24 tablet Take 1 tablet (25 mg total) by mouth daily. 30 tablet 12  . pantoprazole (PROTONIX) 40 MG tablet Take 1 tablet (40 mg total) by mouth daily. 30 tablet 6  . polyethylene glycol powder (GLYCOLAX/MIRALAX) powder 1 capful in 6 oz of water once daily 500 g 12  . tamsulosin (FLOMAX) 0.4 MG CAPS capsule Take 2 capsules (0.8 mg total) by mouth daily after supper. 60 capsule 6   No current facility-administered medications on file prior to visit.     Allergies  Allergen Reactions  . Altace [Ramipril] Other (See Comments)    As per old records from [place]  . Amoxicillin Other (See Comments)    Per old records from [place]  . Cozaar [Losartan Potassium] Other (See Comments)    As per old records from [place]    Objective: Physical Exam  General: Well developed, nourished, no acute distress, awake, alert and oriented x 3  Vascular: Dorsalis pedis artery 1/4 bilateral, Posterior tibial artery 0/4 bilateral, +1 pitting edema bilateral, skin temperature warm to slightly cool proximal to distal bilateral lower extremities, +  varicosities, no pedal hair present bilateral.  Neurological: Gross sensation present via light touch bilateral.   Dermatological: Skin is warm, dry, and supple bilateral, Nails 1-10 are tender, long, thick, and discolored with mild subungal debris, no webspace macerations present bilateral, no open lesions present bilateral, no callus/corns/hyperkeratotic tissue present bilateral. No signs of infection bilateral.  Musculoskeletal: Mild asymptomatic hammertoe boney deformities noted bilateral. Muscular strength  within normal limits without pain on range of motion. No pain with calf compression bilateral.  Assessment and Plan:  Problem List Items Addressed This Visit    None    Visit Diagnoses    Onychomycosis    -  Primary   Foot pain, bilateral       PVD (peripheral vascular disease) ([place])         -Examined patient.  -Discussed treatment options for painful mycotic nails. -Mechanically debrided and reduced mycotic nails with sterile nail nipper and dremel nail file without incident. -Recommend cont with compression stockings and lower leg elevation to assist with edema control -Recommend continue with good supportive shoes for foot type -Patient to return in 3 months for follow up nail care/ evaluation or sooner if symptoms worsen.  Landis Martins, DPM

## 2016-04-19 ENCOUNTER — Encounter (HOSPITAL_COMMUNITY): Payer: Self-pay

## 2016-04-19 ENCOUNTER — Emergency Department (HOSPITAL_COMMUNITY)
Admission: EM | Admit: 2016-04-19 | Discharge: 2016-04-19 | Disposition: A | Discharge status: Home / Self Care | Payer: Medicare Other | Attending: Emergency Medicine | Admitting: Emergency Medicine

## 2016-04-19 ENCOUNTER — Emergency Department (HOSPITAL_COMMUNITY): Payer: Medicare Other

## 2016-04-19 DIAGNOSIS — I5032 Chronic diastolic (congestive) heart failure: Secondary | ICD-10-CM | POA: Diagnosis not present

## 2016-04-19 DIAGNOSIS — R079 Chest pain, unspecified: Secondary | ICD-10-CM | POA: Insufficient documentation

## 2016-04-19 DIAGNOSIS — Z87891 Personal history of nicotine dependence: Secondary | ICD-10-CM | POA: Diagnosis not present

## 2016-04-19 DIAGNOSIS — N183 Chronic kidney disease, stage 3 (moderate): Secondary | ICD-10-CM | POA: Insufficient documentation

## 2016-04-19 DIAGNOSIS — Z66 Do not resuscitate: Secondary | ICD-10-CM | POA: Diagnosis not present

## 2016-04-19 DIAGNOSIS — I252 Old myocardial infarction: Secondary | ICD-10-CM | POA: Insufficient documentation

## 2016-04-19 DIAGNOSIS — I130 Hypertensive heart and chronic kidney disease with heart failure and stage 1 through stage 4 chronic kidney disease, or unspecified chronic kidney disease: Secondary | ICD-10-CM | POA: Diagnosis not present

## 2016-04-19 DIAGNOSIS — Z8673 Personal history of transient ischemic attack (TIA), and cerebral infarction without residual deficits: Secondary | ICD-10-CM | POA: Insufficient documentation

## 2016-04-19 DIAGNOSIS — Z7982 Long term (current) use of aspirin: Secondary | ICD-10-CM | POA: Insufficient documentation

## 2016-04-19 LAB — CBC
HEMATOCRIT: 32.6 % — AB (ref 39.0–52.0)
HEMOGLOBIN: 11 g/dL — AB (ref 13.0–17.0)
MCH: 30.5 pg (ref 26.0–34.0)
MCHC: 33.7 g/dL (ref 30.0–36.0)
MCV: 90.3 fL (ref 78.0–100.0)
Platelets: 187 10*3/uL (ref 150–400)
RBC: 3.61 MIL/uL — AB (ref 4.22–5.81)
RDW: 13.3 % (ref 11.5–15.5)
WBC: 7.7 10*3/uL (ref 4.0–10.5)

## 2016-04-19 LAB — BASIC METABOLIC PANEL
ANION GAP: 8 (ref 5–15)
BUN: 17 mg/dL (ref 6–20)
CO2: 27 mmol/L (ref 22–32)
Calcium: 8.9 mg/dL (ref 8.9–10.3)
Chloride: 106 mmol/L (ref 101–111)
Creatinine, Ser: 1.31 mg/dL — ABNORMAL HIGH (ref 0.61–1.24)
GFR calc Af Amer: 53 mL/min — ABNORMAL LOW (ref >60)
GFR, EST NON AFRICAN AMERICAN: 46 mL/min — AB (ref >60)
Glucose, Bld: 125 mg/dL — ABNORMAL HIGH (ref 65–99)
POTASSIUM: 3.6 mmol/L (ref 3.5–5.1)
SODIUM: 141 mmol/L (ref 135–145)

## 2016-04-19 LAB — I-STAT TROPONIN, ED
Troponin i, poc: 0.01 ng/mL (ref 0.00–0.08)
Troponin i, poc: 0.01 ng/mL (ref 0.00–0.08)

## 2016-04-19 LAB — BRAIN NATRIURETIC PEPTIDE: B NATRIURETIC PEPTIDE 5: 55.4 pg/mL (ref 0.0–100.0)

## 2016-04-19 NOTE — ED Notes (Signed)
[facility] called for transport.  

## 2016-04-19 NOTE — ED Provider Notes (Signed)
[facility] DEPT Provider Note   CSN: [id number] Arrival date & time: 04/19/16  0551     History   Chief Complaint Chief Complaint  Patient presents with  . Chest Pain    HPI Lance Kemp is a 82 y.o. male.  Patient with CHF, dementia, CVA, and more as listed below presents to the ED from [facility] with a chief complaint of chest pain.  Patient states that his chest pain began yesterday.  He states that he has some associated shortness of breath.  Patient states that he was unable to sleep because of his symptoms.  He denies any cough or fever, but has had some chills.  There are no other associated symptoms.  There are no modifying factors.  Patient is DNR.   The history is provided by the patient. No language interpreter was used.    Past Medical History:  Diagnosis Date  . Basal cell carcinoma 2011; 2016   Face  . BPH with obstruction/lower urinary tract symptoms   . Chronic back pain   . Chronic diastolic heart failure ([place])   . Chronic left shoulder pain 09/14/2013  . Chronic renal insufficiency, stage III (moderate) 2013   CrCl in the 50s-60s  . Chronic venous insufficiency    compression hose  . 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  No significant residual deficit except short term memory impairment  . Dementia 2016   Dr. Delice Lesch 08/2014--started aricept but pt did not tolerate  . Depression    antidepressant x 1 yr--situational. Resolved 08/2013 per pt/son.  . Frequent headaches   . Glaucoma   . Hypertension   . Normocytic anemia 2015   secondary to CRI (iron studies ok 12/2013)  . NSTEMI (non-ST elevated myocardial infarction) ([place]) 06/2011   [place]: Troponin mildly elevated, normal EKG, normal ECHO, pt declined cardiology consult--per old PCP records.  Marland Kitchen OAB (overactive bladder)   . Osteoarthritis    hands/wrists  . Peripheral neuropathy ([place]) 2013   Dx'd by neuro in [place] at the time the patient was hospitalized briefly for pontine  CVA.  Marland Kitchen Shortness of breath   . Urine incontinence     Patient Active Problem List   Diagnosis Date Noted  . Basal cell carcinoma, face 04/07/2015  . Mild dementia 11/24/2014  . OAB (overactive bladder) 10/17/2014  . Essential hypertension 08/25/2014  . Hyperlipidemia 08/25/2014  . Peripheral edema 04/16/2014  . Chronic diastolic heart failure ([place]) 03/04/2014  . Chronic renal insufficiency, stage III (moderate)   . Chronic shoulder pain 02/19/2014  . SOB (shortness of breath) 02/18/2014  . Acute on chronic diastolic heart failure ([place]) 12/24/2013  . Lumbago 12/18/2013  . History of CVA (cerebrovascular accident) 11/21/2013  . BPH associated with nocturia 11/21/2013  . Short-term memory loss 10/24/2013  . Chronic left shoulder pain 09/14/2013  . Venous insufficiency of both lower extremities 09/14/2013  . HTN (hypertension), benign 09/14/2013    Past Surgical History:  Procedure Laterality Date  . CHOLECYSTECTOMY    . NM VQ LUNG SCAN ([place] [facility]X)  09/2014   Low risk  . TONSILECTOMY, ADENOIDECTOMY, BILATERAL MYRINGOTOMY AND TUBES    . TRANSTHORACIC ECHOCARDIOGRAM  12/18/13   Grade 1 diast dysf, o/w normal       Home Medications    Prior to Admission medications   Medication Sig Start Date End Date Taking? Authorizing Provider  aspirin 81 MG chewable tablet Chew 81 mg by mouth daily.    Historical Provider, MD  atorvastatin (  LIPITOR) 20 MG tablet Take 20 mg by mouth every evening.  10/28/13   Historical Provider, MD  azelastine (OPTIVAR) 0.05 % ophthalmic solution Place 2 drops into both eyes 2 (two) times daily.    Historical Provider, MD  finasteride (PROSCAR) 5 MG tablet Take 1 tablet (5 mg total) by mouth daily. 01/28/14   [name], MD  furosemide (LASIX) 40 MG tablet Take 40 mg by mouth daily.    Historical Provider, MD  hydrALAZINE (APRESOLINE) 10 MG tablet Take 10 mg by mouth 3 (three) times daily.  08/12/13   Historical Provider, MD  HYDROcodone-acetaminophen  (NORCO/VICODIN) 5-325 MG tablet 1 tab by mouth every twelve hours to be given only WHEN REQUESTED BY PATIENT. 03/14/16   [name], MD  levobunolol (BETAGAN) 0.5 % ophthalmic solution Place 1 drop into both eyes 2 (two) times daily.    Historical Provider, MD  meclizine (ANTIVERT) 50 MG tablet Take 1 tablet (50 mg total) by mouth 3 (three) times daily as needed for dizziness. 07/27/15   Heriberto Ant[place]ua, MD  mirabegron ER (MYRBETRIQ) 25 MG TB24 tablet Take 1 tablet (25 mg total) by mouth daily. 09/24/15   [name], MD  pantoprazole (PROTONIX) 40 MG tablet Take 1 tablet (40 mg total) by mouth daily. 11/03/15   [name], MD  polyethylene glycol powder (GLYCOLAX/MIRALAX) powder 1 capful in 6 oz of water once daily 11/03/15   [name], MD  tamsulosin (FLOMAX) 0.4 MG CAPS capsule Take 2 capsules (0.8 mg total) by mouth daily after supper. 01/02/14   [name], MD    Family History Family History  Problem Relation Age of Onset  . Cancer Mother   . Heart disease Mother     Social History Social History  Substance Use Topics  . Smoking status: Former Smoker    Types: Pipe  . Smokeless tobacco: Never Used  . Alcohol use No     Allergies   Altace [ramipril]; Amoxicillin; and Cozaar [losartan potassium]   Review of Systems Review of Systems  Respiratory: Positive for shortness of breath.   Cardiovascular: Positive for chest pain.  All other systems reviewed and are negative.    Physical Exam Updated Vital Signs Pulse 68   Temp 97.3 F (36.3 C)   Resp 17   Ht 5\' 7"  (1.702 m)   Wt 70.8 kg   SpO2 99%   BMI 24.43 kg/m   Physical Exam  Constitutional: He is oriented to person, place, and time. He appears well-developed and well-nourished.  HENT:  Head: Normocephalic and atraumatic.  Eyes: Conjunctivae and EOM are normal. Pupils are equal, round, and reactive to light. Right eye exhibits no discharge. Left eye exhibits no discharge. No scleral icterus.    Neck: Normal range of motion. Neck supple. No JVD present.  Cardiovascular: Normal rate, regular rhythm and normal heart sounds.  Exam reveals no gallop and no friction rub.   No murmur heard. Pulmonary/Chest: Effort normal and breath sounds normal. No respiratory distress. He has no wheezes. He has no rales. He exhibits no tenderness.  Abdominal: Soft. He exhibits no distension and no mass. There is no tenderness. There is no rebound and no guarding.  Musculoskeletal: Normal range of motion. He exhibits no edema or tenderness.  Neurological: He is alert and oriented to person, place, and time.  Skin: Skin is warm and dry.  Psychiatric: He has a normal mood and affect. His behavior is normal. Judgment and thought  content normal.  Nursing note and vitals reviewed.    ED Treatments / Results  Labs (all labs ordered are listed, but only abnormal results are displayed) Labs Reviewed  BASIC METABOLIC PANEL - Abnormal; Notable for the following:       Result Value   Glucose, Bld 125 (*)    Creatinine, Ser 1.31 (*)    GFR calc non Af Amer 46 (*)    GFR calc Af Amer 53 (*)    All other components within normal limits  CBC - Abnormal; Notable for the following:    RBC 3.61 (*)    Hemoglobin 11.0 (*)    HCT 32.6 (*)    All other components within normal limits  BRAIN NATRIURETIC PEPTIDE  I-STAT TROPOININ, ED  I-STAT TROPOININ, ED    EKG  EKG Interpretation  Date/Time:  Wednesday April 19 2016 05:59:12 EDT Ventricular Rate:  67 PR Interval:    QRS Duration: 99 QT Interval:  430 QTC Calculation: 454 R Axis:   -34 Text Interpretation:  Sinus rhythm Abnormal R-wave progression, early transition LVH with secondary repolarization abnormality agree. no sig change from old Confirmed by Johnney Killian, MD, Jeannie Done [phone]) on 04/19/2016 9:51:57 AM       Radiology Dg Chest 2 View  Result Date: 04/19/2016 CLINICAL DATA:  Chest pain, onset after dinner last night. EXAM: CHEST  2 VIEW  COMPARISON:  02/12/2015 FINDINGS: The lungs are clear. The pulmonary vasculature is normal. Heart size is normal. Hilar and mediastinal contours are unremarkable. There is no pleural effusion. IMPRESSION: No active cardiopulmonary disease. Electronically Signed   By: Andreas Newport M.D.   On: 04/19/2016 06:52    Procedures Procedures (including critical care time)  Medications Ordered in ED Medications - No data to display   Initial Impression / Assessment and Plan / ED Course  I have reviewed the triage vital signs and the nursing notes.  Pertinent labs & imaging results that were available during my care of the patient were reviewed by me and considered in my medical decision making (see chart for details).  Clinical Course    Patient with chest pain.  Hx of dementia.  Basic labs are reassuring. Chest x-ray unremarkable, no evidence of pneumonia, or fluid overoad. BNP is not elevated. Initial troponin is negative.  Will check delta troponin.  Delta troponin is normal. Patient seen by and discussed with Dr. Johnney Killian, who agrees with plan for discharge back to facility.    Final Clinical Impressions(s) / ED Diagnoses   Final diagnoses:  Chest pain, unspecified type    New Prescriptions New Prescriptions   No medications on file     Montine Circle, PA-C 04/19/16 [place], MD 04/24/16 [phone]

## 2016-04-19 NOTE — ED Triage Notes (Signed)
Pt from [facility] for chest pain. Pt started having pain yesterday 10-31 around 4:30pm was given oxycodon at facility no relief. Pt could not sleep and became nauseas so called EMS to come to the hospital. Pt c/o of mild SOB with sats at 100% and 2L for comfort.

## 2016-04-19 NOTE — ED Notes (Signed)
Notified [facility] for transportation back home 

## 2016-04-25 ENCOUNTER — Encounter (HOSPITAL_COMMUNITY): Payer: Self-pay | Admitting: Emergency Medicine

## 2016-04-25 ENCOUNTER — Inpatient Hospital Stay (HOSPITAL_COMMUNITY)
Admission: EM | Admit: 2016-04-25 | Discharge: 2016-04-27 | DRG: 603 | Disposition: A | Discharge status: Skilled Nursing Facility | Payer: Medicare Other | Attending: Nephrology | Admitting: Nephrology

## 2016-04-25 DIAGNOSIS — I5032 Chronic diastolic (congestive) heart failure: Secondary | ICD-10-CM | POA: Diagnosis not present

## 2016-04-25 DIAGNOSIS — G629 Polyneuropathy, unspecified: Secondary | ICD-10-CM | POA: Diagnosis not present

## 2016-04-25 DIAGNOSIS — M1990 Unspecified osteoarthritis, unspecified site: Secondary | ICD-10-CM | POA: Diagnosis present

## 2016-04-25 DIAGNOSIS — Z8673 Personal history of transient ischemic attack (TIA), and cerebral infarction without residual deficits: Secondary | ICD-10-CM

## 2016-04-25 DIAGNOSIS — I10 Essential (primary) hypertension: Secondary | ICD-10-CM | POA: Diagnosis not present

## 2016-04-25 DIAGNOSIS — Z85828 Personal history of other malignant neoplasm of skin: Secondary | ICD-10-CM

## 2016-04-25 DIAGNOSIS — L03116 Cellulitis of left lower limb: Secondary | ICD-10-CM | POA: Diagnosis not present

## 2016-04-25 DIAGNOSIS — Z66 Do not resuscitate: Secondary | ICD-10-CM | POA: Diagnosis present

## 2016-04-25 DIAGNOSIS — Z888 Allergy status to other drugs, medicaments and biological substances status: Secondary | ICD-10-CM

## 2016-04-25 DIAGNOSIS — D649 Anemia, unspecified: Secondary | ICD-10-CM | POA: Diagnosis present

## 2016-04-25 DIAGNOSIS — R413 Other amnesia: Secondary | ICD-10-CM | POA: Diagnosis present

## 2016-04-25 DIAGNOSIS — F0390 Unspecified dementia without behavioral disturbance: Secondary | ICD-10-CM | POA: Diagnosis not present

## 2016-04-25 DIAGNOSIS — I130 Hypertensive heart and chronic kidney disease with heart failure and stage 1 through stage 4 chronic kidney disease, or unspecified chronic kidney disease: Secondary | ICD-10-CM | POA: Diagnosis present

## 2016-04-25 DIAGNOSIS — N2889 Other specified disorders of kidney and ureter: Secondary | ICD-10-CM | POA: Diagnosis present

## 2016-04-25 DIAGNOSIS — Z8249 Family history of ischemic heart disease and other diseases of the circulatory system: Secondary | ICD-10-CM

## 2016-04-25 DIAGNOSIS — Z881 Allergy status to other antibiotic agents status: Secondary | ICD-10-CM

## 2016-04-25 DIAGNOSIS — I252 Old myocardial infarction: Secondary | ICD-10-CM

## 2016-04-25 DIAGNOSIS — I872 Venous insufficiency (chronic) (peripheral): Secondary | ICD-10-CM | POA: Diagnosis present

## 2016-04-25 DIAGNOSIS — G8194 Hemiplegia, unspecified affecting left nondominant side: Secondary | ICD-10-CM

## 2016-04-25 DIAGNOSIS — Z809 Family history of malignant neoplasm, unspecified: Secondary | ICD-10-CM

## 2016-04-25 DIAGNOSIS — R030 Elevated blood-pressure reading, without diagnosis of hypertension: Secondary | ICD-10-CM | POA: Diagnosis not present

## 2016-04-25 DIAGNOSIS — R2242 Localized swelling, mass and lump, left lower limb: Secondary | ICD-10-CM | POA: Diagnosis not present

## 2016-04-25 DIAGNOSIS — Z7982 Long term (current) use of aspirin: Secondary | ICD-10-CM

## 2016-04-25 DIAGNOSIS — N183 Chronic kidney disease, stage 3 unspecified: Secondary | ICD-10-CM | POA: Diagnosis present

## 2016-04-25 DIAGNOSIS — Z87891 Personal history of nicotine dependence: Secondary | ICD-10-CM

## 2016-04-25 DIAGNOSIS — E876 Hypokalemia: Secondary | ICD-10-CM | POA: Diagnosis present

## 2016-04-25 DIAGNOSIS — L0390 Cellulitis, unspecified: Secondary | ICD-10-CM | POA: Diagnosis present

## 2016-04-25 LAB — CBC WITH DIFFERENTIAL/PLATELET
BASOS ABS: 0.1 10*3/uL (ref 0.0–0.1)
BASOS PCT: 1 %
EOS ABS: 0.7 10*3/uL (ref 0.0–0.7)
Eosinophils Relative: 8 %
HCT: 36.2 % — ABNORMAL LOW (ref 39.0–52.0)
HEMOGLOBIN: 12.2 g/dL — AB (ref 13.0–17.0)
Lymphocytes Relative: 30 %
Lymphs Abs: 2.4 10*3/uL (ref 0.7–4.0)
MCH: 30.6 pg (ref 26.0–34.0)
MCHC: 33.7 g/dL (ref 30.0–36.0)
MCV: 90.7 fL (ref 78.0–100.0)
MONOS PCT: 13 %
Monocytes Absolute: 1 10*3/uL (ref 0.1–1.0)
NEUTROS ABS: 3.9 10*3/uL (ref 1.7–7.7)
NEUTROS PCT: 48 %
Platelets: 176 10*3/uL (ref 150–400)
RBC: 3.99 MIL/uL — ABNORMAL LOW (ref 4.22–5.81)
RDW: 13.3 % (ref 11.5–15.5)
WBC: 8 10*3/uL (ref 4.0–10.5)

## 2016-04-25 LAB — BASIC METABOLIC PANEL
ANION GAP: 8 (ref 5–15)
BUN: 17 mg/dL (ref 6–20)
CALCIUM: 8.9 mg/dL (ref 8.9–10.3)
CHLORIDE: 104 mmol/L (ref 101–111)
CO2: 29 mmol/L (ref 22–32)
CREATININE: 1.2 mg/dL (ref 0.61–1.24)
GFR calc non Af Amer: 51 mL/min — ABNORMAL LOW (ref >60)
GFR, EST AFRICAN AMERICAN: 59 mL/min — AB (ref >60)
Glucose, Bld: 115 mg/dL — ABNORMAL HIGH (ref 65–99)
Potassium: 3.3 mmol/L — ABNORMAL LOW (ref 3.5–5.1)
Sodium: 141 mmol/L (ref 135–145)

## 2016-04-25 LAB — URINALYSIS, ROUTINE W REFLEX MICROSCOPIC
BILIRUBIN URINE: NEGATIVE
Glucose, UA: NEGATIVE mg/dL
Hgb urine dipstick: NEGATIVE
KETONES UR: NEGATIVE mg/dL
LEUKOCYTES UA: NEGATIVE
NITRITE: NEGATIVE
Protein, ur: NEGATIVE mg/dL
SPECIFIC GRAVITY, URINE: 1.007 (ref 1.005–1.030)
pH: 7.5 (ref 5.0–8.0)

## 2016-04-25 MED ORDER — ENOXAPARIN SODIUM 40 MG/0.4ML ~~LOC~~ SOLN
40.0000 mg | Freq: Every day | SUBCUTANEOUS | Status: DC
  Administered 2016-04-25 – 2016-04-26 (×2): 40 mg via SUBCUTANEOUS
  Filled 2016-04-25 (×2): qty 0.4

## 2016-04-25 MED ORDER — ATORVASTATIN CALCIUM 10 MG PO TABS
20.0000 mg | ORAL_TABLET | Freq: Every day | ORAL | Status: DC
  Administered 2016-04-25 – 2016-04-26 (×2): 20 mg via ORAL
  Filled 2016-04-25: qty 2
  Filled 2016-04-25: qty 1

## 2016-04-25 MED ORDER — MECLIZINE HCL 25 MG PO TABS: 50.0000 mg | ORAL_TABLET | Freq: Three times a day (TID) | ORAL | Status: DC | PRN

## 2016-04-25 MED ORDER — FUROSEMIDE 40 MG PO TABS
40.0000 mg | ORAL_TABLET | Freq: Every day | ORAL | Status: DC
  Administered 2016-04-26 – 2016-04-27 (×2): 40 mg via ORAL
  Filled 2016-04-25 (×2): qty 1

## 2016-04-25 MED ORDER — LEVOBUNOLOL HCL 0.5 % OP SOLN
1.0000 [drp] | Freq: Two times a day (BID) | OPHTHALMIC | Status: DC
  Administered 2016-04-25 – 2016-04-27 (×4): 1 [drp] via OPHTHALMIC
  Filled 2016-04-25: qty 5

## 2016-04-25 MED ORDER — ACETAMINOPHEN 325 MG PO TABS: 650.0000 mg | ORAL_TABLET | Freq: Four times a day (QID) | ORAL | Status: DC | PRN

## 2016-04-25 MED ORDER — VANCOMYCIN HCL IN DEXTROSE 750-5 MG/150ML-% IV SOLN
750.0000 mg | INTRAVENOUS | Status: DC
  Administered 2016-04-26: 750 mg via INTRAVENOUS
  Filled 2016-04-25 (×2): qty 150

## 2016-04-25 MED ORDER — MIRABEGRON ER 25 MG PO TB24
25.0000 mg | ORAL_TABLET | Freq: Every day | ORAL | Status: DC
  Administered 2016-04-26 – 2016-04-27 (×2): 25 mg via ORAL
  Filled 2016-04-25 (×2): qty 1

## 2016-04-25 MED ORDER — ASPIRIN 81 MG PO CHEW
81.0000 mg | CHEWABLE_TABLET | Freq: Every day | ORAL | Status: DC
  Administered 2016-04-26 – 2016-04-27 (×2): 81 mg via ORAL
  Filled 2016-04-25 (×2): qty 1

## 2016-04-25 MED ORDER — HYDROCODONE-ACETAMINOPHEN 5-325 MG PO TABS: 1.0000 | ORAL_TABLET | Freq: Four times a day (QID) | ORAL | Status: DC | PRN

## 2016-04-25 MED ORDER — VANCOMYCIN HCL IN DEXTROSE 1-5 GM/200ML-% IV SOLN
1000.0000 mg | INTRAVENOUS | Status: AC
  Administered 2016-04-25: 1000 mg via INTRAVENOUS
  Filled 2016-04-25: qty 200

## 2016-04-25 MED ORDER — TAMSULOSIN HCL 0.4 MG PO CAPS
0.8000 mg | ORAL_CAPSULE | Freq: Every day | ORAL | Status: DC
  Administered 2016-04-25 – 2016-04-26 (×2): 0.8 mg via ORAL
  Filled 2016-04-25: qty 2

## 2016-04-25 MED ORDER — FINASTERIDE 5 MG PO TABS
5.0000 mg | ORAL_TABLET | Freq: Every day | ORAL | Status: DC
  Administered 2016-04-26 – 2016-04-27 (×2): 5 mg via ORAL
  Filled 2016-04-25 (×2): qty 1

## 2016-04-25 MED ORDER — POLYETHYLENE GLYCOL 3350 17 G PO PACK
17.0000 g | PACK | Freq: Every day | ORAL | Status: DC
  Administered 2016-04-26 – 2016-04-27 (×2): 17 g via ORAL
  Filled 2016-04-25 (×2): qty 1

## 2016-04-25 MED ORDER — ONDANSETRON HCL 4 MG PO TABS: 4.0000 mg | ORAL_TABLET | Freq: Four times a day (QID) | ORAL | Status: DC | PRN

## 2016-04-25 MED ORDER — ACETAMINOPHEN 650 MG RE SUPP: 650.0000 mg | Freq: Four times a day (QID) | RECTAL | Status: DC | PRN

## 2016-04-25 MED ORDER — ONDANSETRON HCL 4 MG/2ML IJ SOLN: 4.0000 mg | Freq: Four times a day (QID) | INTRAMUSCULAR | Status: DC | PRN

## 2016-04-25 MED ORDER — PANTOPRAZOLE SODIUM 40 MG PO TBEC
40.0000 mg | DELAYED_RELEASE_TABLET | Freq: Every day | ORAL | Status: DC
  Administered 2016-04-26 – 2016-04-27 (×2): 40 mg via ORAL
  Filled 2016-04-25 (×2): qty 1

## 2016-04-25 MED ORDER — HYDRALAZINE HCL 10 MG PO TABS
10.0000 mg | ORAL_TABLET | Freq: Three times a day (TID) | ORAL | Status: DC
  Administered 2016-04-25 – 2016-04-27 (×5): 10 mg via ORAL
  Filled 2016-04-25 (×6): qty 1

## 2016-04-25 NOTE — ED Triage Notes (Signed)
Per EMS, patient has left leg edema. Patient is prescribed lasix. Patient did not take any of his medications because "he was doing an experiment to see if he could get rid of his headache." Patient ambulates with walker. Patient is from [facility]

## 2016-04-25 NOTE — ED Provider Notes (Signed)
[facility] DEPT Provider Note   CSN: [id number] Arrival date & time: 04/25/16  1539     History   Chief Complaint Chief Complaint  Patient presents with  . Leg Swelling    Left    HPI Lance Kemp is a 82 y.o. male.  Left lower extremity redness and swelling since earlier today. No fever, sweats, chills. This has never happened before. Patient is tender to palpation in this area.      Past Medical History:  Diagnosis Date  . Basal cell carcinoma 2011; 2016   Face  . BPH with obstruction/lower urinary tract symptoms   . Chronic back pain   . Chronic diastolic heart failure ([place])   . Chronic left shoulder pain 09/14/2013  . Chronic renal insufficiency, stage III (moderate) 2013   CrCl in the 50s-60s  . Chronic venous insufficiency    compression hose  . 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  No significant residual deficit except short term memory impairment  . Dementia 2016   Dr. Delice Lesch 08/2014--started aricept but pt did not tolerate  . Depression    antidepressant x 1 yr--situational. Resolved 08/2013 per pt/son.  . Frequent headaches   . Glaucoma   . Hypertension   . Normocytic anemia 2015   secondary to CRI (iron studies ok 12/2013)  . NSTEMI (non-ST elevated myocardial infarction) ([place]) 06/2011   [place]: Troponin mildly elevated, normal EKG, normal ECHO, pt declined cardiology consult--per old PCP records.  Marland Kitchen OAB (overactive bladder)   . Osteoarthritis    hands/wrists  . Peripheral neuropathy ([place]) 2013   Dx'd by neuro in [place] at the time the patient was hospitalized briefly for pontine CVA.  Marland Kitchen Shortness of breath   . Urine incontinence     Patient Active Problem List   Diagnosis Date Noted  . Cellulitis of left lower extremity 04/25/2016  . Basal cell carcinoma, face 04/07/2015  . Mild dementia 11/24/2014  . OAB (overactive bladder) 10/17/2014  . Essential hypertension 08/25/2014  . Hyperlipidemia 08/25/2014  . Peripheral edema  04/16/2014  . Chronic diastolic heart failure ([place]) 03/04/2014  . Chronic renal insufficiency, stage III (moderate)   . Chronic shoulder pain 02/19/2014  . SOB (shortness of breath) 02/18/2014  . Acute on chronic diastolic heart failure ([place]) 12/24/2013  . Lumbago 12/18/2013  . History of CVA (cerebrovascular accident) 11/21/2013  . BPH associated with nocturia 11/21/2013  . Short-term memory loss 10/24/2013  . Chronic left shoulder pain 09/14/2013  . Venous insufficiency of both lower extremities 09/14/2013  . HTN (hypertension), benign 09/14/2013    Past Surgical History:  Procedure Laterality Date  . CHOLECYSTECTOMY    . NM VQ LUNG SCAN ([place] [facility])  09/2014   Low risk  . TONSILECTOMY, ADENOIDECTOMY, BILATERAL MYRINGOTOMY AND TUBES    . TRANSTHORACIC ECHOCARDIOGRAM  12/18/13   Grade 1 diast dysf, o/w normal       Home Medications    Prior to Admission medications   Medication Sig Start Date End Date Taking? Authorizing Provider  aspirin 81 MG chewable tablet Chew 81 mg by mouth daily.   Yes Historical Provider, MD  atorvastatin (LIPITOR) 20 MG tablet Take 20 mg by mouth at bedtime.    Yes Historical Provider, MD  azelastine (OPTIVAR) 0.05 % ophthalmic solution Place 1 drop into both eyes 2 (two) times daily.    Yes Historical Provider, MD  finasteride (PROSCAR) 5 MG tablet Take 1 tablet (5 mg total) by mouth daily. 01/28/14  Yes  [name], MD  furosemide (LASIX) 40 MG tablet Take 40 mg by mouth daily.   Yes Historical Provider, MD  hydrALAZINE (APRESOLINE) 10 MG tablet Take 10 mg by mouth 3 (three) times daily.    Yes Historical Provider, MD  HYDROcodone-acetaminophen (NORCO/VICODIN) 5-325 MG tablet Take 1 tablet by mouth every 6 (six) hours as needed for moderate pain.   Yes Historical Provider, MD  levobunolol (BETAGAN) 0.5 % ophthalmic solution Place 1 drop into both eyes 2 (two) times daily.   Yes Historical Provider, MD  meclizine (ANTIVERT) 50 MG tablet Take 1 tablet  (50 mg total) by mouth 3 (three) times daily as needed for dizziness. 07/27/15  Yes Heriberto Antigua, MD  mirabegron ER (MYRBETRIQ) 25 MG TB24 tablet Take 1 tablet (25 mg total) by mouth daily. 09/24/15  Yes [name], MD  pantoprazole (PROTONIX) 40 MG tablet Take 1 tablet (40 mg total) by mouth daily. 11/03/15  Yes [name], MD  polyethylene glycol (MIRALAX / GLYCOLAX) packet Take 17 g by mouth daily.   Yes Historical Provider, MD  tamsulosin (FLOMAX) 0.4 MG CAPS capsule Take 0.8 mg by mouth at bedtime.   Yes Historical Provider, MD    Family History Family History  Problem Relation Age of Onset  . Cancer Mother   . Heart disease Mother     Social History Social History  Substance Use Topics  . Smoking status: Former Smoker    Types: Pipe  . Smokeless tobacco: Never Used  . Alcohol use No     Allergies   Altace [ramipril]; Amoxicillin; and Cozaar [losartan potassium]   Review of Systems Review of Systems  All other systems reviewed and are negative.    Physical Exam Updated Vital Signs BP 134/68 (BP Location: Right Arm)   Pulse 76   Temp 97.3 F (36.3 C) (Axillary)   Resp 18   Ht 5\' 7"  (1.702 m)   Wt 156 lb (70.8 kg)   SpO2 96%   BMI 24.43 kg/m   Physical Exam  Constitutional: He is oriented to person, place, and time. He appears well-developed and well-nourished.  HENT:  Head: Normocephalic and atraumatic.  Eyes: Conjunctivae are normal.  Neck: Neck supple.  Cardiovascular: Normal rate and regular rhythm.   Pulmonary/Chest: Effort normal and breath sounds normal.  Abdominal: Soft. Bowel sounds are normal.  Musculoskeletal: Normal range of motion.  Neurological: He is alert and oriented to person, place, and time.  Skin:  Left lower extremity: Area of erythema and edema from the proximal tibia is distally to the ankle.  Psychiatric: He has a normal mood and affect. His behavior is normal.  Nursing note and vitals reviewed.    ED Treatments /  Results  Labs (all labs ordered are listed, but only abnormal results are displayed) Labs Reviewed  CBC WITH DIFFERENTIAL/PLATELET - Abnormal; Notable for the following:       Result Value   RBC 3.99 (*)    Hemoglobin 12.2 (*)    HCT 36.2 (*)    All other components within normal limits  BASIC METABOLIC PANEL - Abnormal; Notable for the following:    Potassium 3.3 (*)    Glucose, Bld 115 (*)    GFR calc non Af Amer 51 (*)    GFR calc Af Amer 59 (*)    All other components within normal limits  URINALYSIS, ROUTINE W REFLEX MICROSCOPIC (NOT AT [facility])    EKG  EKG Interpretation None  Radiology No results found.  Procedures Procedures (including critical care time)  Medications Ordered in ED Medications  vancomycin (VANCOCIN) IVPB 750 mg/150 ml premix (not administered)  vancomycin (VANCOCIN) IVPB 1000 mg/200 mL premix (0 mg Intravenous Stopped 04/25/16 1933)     Initial Impression / Assessment and Plan / ED Course  I have reviewed the triage vital signs and the nursing notes.  Pertinent labs & imaging results that were available during my care of the patient were reviewed by me and considered in my medical decision making (see chart for details).  Clinical Course     Patient has developed a cellulitis. He does not appear septic. IV vancomycin. Admit to observation.  Final Clinical Impressions(s) / ED Diagnoses   Final diagnoses:  Cellulitis of left lower extremity    New Prescriptions New Prescriptions   No medications on file     Nat Christen, MD 04/25/16 1944

## 2016-04-25 NOTE — ED Notes (Signed)
Bed: WA17 Expected date:  Expected time:  Means of arrival:  Comments: EMS- 82yo M, LLE edema

## 2016-04-25 NOTE — Progress Notes (Signed)
Pharmacy Antibiotic Note  Lance Kemp is a 82 y.o. male admitted on 04/25/2016 with LLE edema, cellulitis.  Pharmacy has been consulted for Vancomycin dosing.  Plan:  Vancomycin 1g IV once, then 750mg  IV q24h.  Measure Vanc trough at steady state.  Goal VT 10-15 for cellulitis.  Follow up renal fxn, culture results, and clinical course.  Height: 5\' 7"  (170.2 cm) Weight: 156 lb (70.8 kg) IBW/kg (Calculated) : 66.1  Temp (24hrs), Avg:97.3 F (36.3 C), Min:97.3 F (36.3 C), Max:97.3 F (36.3 C)   Recent Labs Lab 04/19/16 0609  WBC 7.7  CREATININE 1.31*    Estimated Creatinine Clearance: 34.3 mL/min (by C-G formula based on SCr of 1.31 mg/dL (H)).    Allergies  Allergen Reactions  . Altace [Ramipril] Other (See Comments)    Reaction:  Unknown   . Amoxicillin Other (See Comments)    Reaction:  Unknown  Has patient had a PCN reaction causing immediate rash, facial/tongue/throat swelling, SOB or lightheadedness with hypotension: Unsure Has patient had a PCN reaction causing severe rash involving mucus membranes or skin necrosis: Unsure Has patient had a PCN reaction that required hospitalization Unsure Has patient had a PCN reaction occurring within the last 10 years: Unsure If all of the above answers are "NO", then may proceed with Cephalosporin use.  Blanchard Kelch [Losartan Potassium] Other (See Comments)    Reaction:  Unknown     Antimicrobials this admission: 11/7 Vancomycin >>    Dose adjustments this admission:   Microbiology results:  Thank you for allowing pharmacy to be a part of this patient's care.  Gretta Arab PharmD, BCPS Pager [phone] 04/25/2016 6:13 PM

## 2016-04-25 NOTE — H&P (Signed)
History and Physical    Lance Kemp [id number] DOB: 05/20/1923 DOA: 04/25/2016  PCP: [name], MD  Patient coming from: Nursing home.  Chief Complaint: Left leg swelling and pain.  HPI: Lance Kemp is a 82 y.o. male with history of CHF, chronic venous insufficiency, hypertension, memory issues and chronic anemia presents to the ER because of sudden worsening of swelling and erythema and pain of the left lower extremity. Patient states since morning patient started developing swelling erythema and pain of the left leg involving the left anterior shin. He denies any fever chills or trauma or insect bite.. Patient states he usually wears stockings and yesterday he did notice some fluid seeping from the skin. On exam patient has erythema and swelling of the left leg and since it developed quickly patient had been admitted for IV antibiotics and observation.  ED Course: Patient was started on vancomycin.  Review of Systems: As per HPI, rest all negative.   Past Medical History:  Diagnosis Date  . Basal cell carcinoma 2011; 2016   Face  . BPH with obstruction/lower urinary tract symptoms   . Chronic back pain   . Chronic diastolic heart failure ([place])   . Chronic left shoulder pain 09/14/2013  . Chronic renal insufficiency, stage III (moderate) 2013   CrCl in the 50s-60s  . Chronic venous insufficiency    compression hose  . 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  No significant residual deficit except short term memory impairment  . Dementia 2016   Dr. Delice Lesch 08/2014--started aricept but pt did not tolerate  . Depression    antidepressant x 1 yr--situational. Resolved 08/2013 per pt/son.  . Frequent headaches   . Glaucoma   . Hypertension   . Normocytic anemia 2015   secondary to CRI (iron studies ok 12/2013)  . NSTEMI (non-ST elevated myocardial infarction) ([place]) 06/2011   [place]: Troponin mildly elevated, normal EKG, normal ECHO, pt declined cardiology  consult--per old PCP records.  Marland Kitchen OAB (overactive bladder)   . Osteoarthritis    hands/wrists  . Peripheral neuropathy ([place]) 2013   Dx'd by neuro in [place] at the time the patient was hospitalized briefly for pontine CVA.  Marland Kitchen Shortness of breath   . Urine incontinence     Past Surgical History:  Procedure Laterality Date  . CHOLECYSTECTOMY    . NM VQ LUNG SCAN ([place] [facility]X)  09/2014   Low risk  . TONSILECTOMY, ADENOIDECTOMY, BILATERAL MYRINGOTOMY AND TUBES    . TRANSTHORACIC ECHOCARDIOGRAM  12/18/13   Grade 1 diast dysf, o/w normal     reports that he has quit smoking. His smoking use included Pipe. He has never used smokeless tobacco. He reports that he does not drink alcohol or use drugs.  Allergies  Allergen Reactions  . Altace [Ramipril] Other (See Comments)    Reaction:  Unknown   . Amoxicillin Other (See Comments)    Reaction:  Unknown  Has patient had a PCN reaction causing immediate rash, facial/tongue/throat swelling, SOB or lightheadedness with hypotension: Unsure Has patient had a PCN reaction causing severe rash involving mucus membranes or skin necrosis: Unsure Has patient had a PCN reaction that required hospitalization Unsure Has patient had a PCN reaction occurring within the last 10 years: Unsure If all of the above answers are "NO", then may proceed with Cephalosporin use.  [name] Potassium] Other (See Comments)    Reaction:  Unknown     Family History  Problem Relation Age of Onset  .  Cancer Mother   . Heart disease Mother     Prior to Admission medications   Medication Sig Start Date End Date Taking? Authorizing Provider  aspirin 81 MG chewable tablet Chew 81 mg by mouth daily.   Yes Historical Provider, MD  atorvastatin (LIPITOR) 20 MG tablet Take 20 mg by mouth at bedtime.    Yes Historical Provider, MD  azelastine (OPTIVAR) 0.05 % ophthalmic solution Place 1 drop into both eyes 2 (two) times daily.    Yes Historical Provider, MD  finasteride  (PROSCAR) 5 MG tablet Take 1 tablet (5 mg total) by mouth daily. 01/28/14  Yes [name], MD  furosemide (LASIX) 40 MG tablet Take 40 mg by mouth daily.   Yes Historical Provider, MD  hydrALAZINE (APRESOLINE) 10 MG tablet Take 10 mg by mouth 3 (three) times daily.    Yes Historical Provider, MD  HYDROcodone-acetaminophen (NORCO/VICODIN) 5-325 MG tablet Take 1 tablet by mouth every 6 (six) hours as needed for moderate pain.   Yes Historical Provider, MD  levobunolol (BETAGAN) 0.5 % ophthalmic solution Place 1 drop into both eyes 2 (two) times daily.   Yes Historical Provider, MD  meclizine (ANTIVERT) 50 MG tablet Take 1 tablet (50 mg total) by mouth 3 (three) times daily as needed for dizziness. 07/27/15  Yes Heriberto Antigua, MD  mirabegron ER (MYRBETRIQ) 25 MG TB24 tablet Take 1 tablet (25 mg total) by mouth daily. 09/24/15  Yes [name], MD  pantoprazole (PROTONIX) 40 MG tablet Take 1 tablet (40 mg total) by mouth daily. 11/03/15  Yes [name], MD  polyethylene glycol (MIRALAX / GLYCOLAX) packet Take 17 g by mouth daily.   Yes Historical Provider, MD  tamsulosin (FLOMAX) 0.4 MG CAPS capsule Take 0.8 mg by mouth at bedtime.   Yes Historical Provider, MD    Physical Exam: Vitals:   04/25/16 1830 04/25/16 1900 04/25/16 2030 04/25/16 2130  BP: (!) 134/118 (!) 152/118 148/56 (!) 146/53  Pulse: 70     Resp: 21 13 20 10   Temp:      TempSrc:      SpO2: 100%     Weight:      Height:          Constitutional: Moderately built and nourished. Vitals:   04/25/16 1830 04/25/16 1900 04/25/16 2030 04/25/16 2130  BP: (!) 134/118 (!) 152/118 148/56 (!) 146/53  Pulse: 70     Resp: 21 13 20 10   Temp:      TempSrc:      SpO2: 100%     Weight:      Height:       Eyes: Anicteric no pallor. ENMT: No discharge from the ears eyes nose or mouth. Neck: No mass felt. No JVD appreciated. Respiratory: No rhonchi or crepitations. Cardiovascular: S1-S2 heard no murmurs appreciated. Abdomen:  Soft nontender bowel sounds present. No guarding or rigidity. Musculoskeletal: Swelling of the left lower extremity involving the anterior shin from the foot to the knee. Skin: Erythema of the left lower extremity. Skin appears warm over the area. Neurologic: Alert awake oriented to name and place. Moves all extremities. Psychiatric: Has some difficulty recalling recent incidents.   Labs on Admission: I have personally reviewed following labs and imaging studies  CBC:  Recent Labs Lab 04/19/16 0609 04/25/16 1805  WBC 7.7 8.0  NEUTROABS  --  3.9  HGB 11.0* 12.2*  HCT 32.6* 36.2*  MCV 90.3 90.7  PLT 187 0000000   Basic Metabolic  Panel:  Recent Labs Lab 04/19/16 0609 04/25/16 1805  NA 141 141  K 3.6 3.3*  CL 106 104  CO2 27 29  GLUCOSE 125* 115*  BUN 17 17  CREATININE 1.31* 1.20  CALCIUM 8.9 8.9   GFR: Estimated Creatinine Clearance: 37.5 mL/min (by C-G formula based on SCr of 1.2 mg/dL). Liver Function Tests: No results for input(s): AST, ALT, ALKPHOS, BILITOT, PROT, ALBUMIN in the last 168 hours. No results for input(s): LIPASE, AMYLASE in the last 168 hours. No results for input(s): AMMONIA in the last 168 hours. Coagulation Profile: No results for input(s): INR, PROTIME in the last 168 hours. Cardiac Enzymes: No results for input(s): CKTOTAL, CKMB, CKMBINDEX, TROPONINI in the last 168 hours. BNP (last 3 results) No results for input(s): PROBNP in the last 8760 hours. HbA1C: No results for input(s): HGBA1C in the last 72 hours. CBG: No results for input(s): GLUCAP in the last 168 hours. Lipid Profile: No results for input(s): CHOL, HDL, LDLCALC, TRIG, CHOLHDL, LDLDIRECT in the last 72 hours. Thyroid Function Tests: No results for input(s): TSH, T4TOTAL, FREET4, T3FREE, THYROIDAB in the last 72 hours. Anemia Panel: No results for input(s): VITAMINB12, FOLATE, FERRITIN, TIBC, IRON, RETICCTPCT in the last 72 hours. Urine analysis:    Component Value Date/Time    COLORURINE YELLOW 04/25/2016 1833   APPEARANCEUR CLEAR 04/25/2016 1833   LABSPEC 1.007 04/25/2016 1833   PHURINE 7.5 04/25/2016 1833   GLUCOSEU NEGATIVE 04/25/2016 1833   HGBUR NEGATIVE 04/25/2016 1833   BILIRUBINUR NEGATIVE 04/25/2016 1833   BILIRUBINUR neg 09/24/2015 1504   KETONESUR NEGATIVE 04/25/2016 1833   PROTEINUR NEGATIVE 04/25/2016 1833   UROBILINOGEN 0.2 09/24/2015 1504   UROBILINOGEN 0.2 09/10/2014 1052   NITRITE NEGATIVE 04/25/2016 1833   LEUKOCYTESUR NEGATIVE 04/25/2016 1833   Sepsis Labs: @LABRCNTIP (procalcitonin:4,lacticidven:4) )No results found for this or any previous visit (from the past 240 hour(s)).   Radiological Exams on Admission: No results found.    Assessment/Plan Principal Problem:   Cellulitis of left lower extremity Active Problems:   Venous insufficiency of both lower extremities   HTN (hypertension), benign   Short-term memory loss   History of CVA (cerebrovascular accident)   Chronic renal insufficiency, stage III (moderate)   Chronic diastolic heart failure ([facility])   Cellulitis    1. Cellulitis of the left lower extremity -  patient has been placed on vancomycin. Closely observe clinically. Check Dopplers to rule out DVT. Keep left leg raised while sleeping.  2. CHF with chronic venous insufficiency, Appears compensated  - continue Lasix 40 mg daily. 3. Hypertension on hydralazine.  4. Chronic anemia - follow CBC. 5. History of CVA on aspirin and statins.   DVT prophylaxis: Lovenox. Code Status: DO NOT RESUSCITATE.  Family Communication: Discussed with patient.  Disposition Plan: Nursing home.  Consults called: None.  Admission status: Observation.    Rise Patience MD [facility] Pager [phone].  If 7PM-7AM, please contact night-coverage [email or web address] Password [email or web address]  04/25/2016, 10:19 PM

## 2016-04-25 NOTE — ED Notes (Signed)
Stuck pt twice was unsuccessful  

## 2016-04-25 NOTE — Clinical Social Work Note (Signed)
Clinical Social Work Assessment  Patient Details  Name: Lance Kemp MRN: [id number] Date of Birth: [birth year]  Date of referral:  04/25/16               Reason for consult:  Discharge Planning                Permission sought to share information with:  Facility Art therapist granted to share information::  Yes, Verbal Permission Granted  Name::        Agency::     Relationship::     Contact Information:     Housing/Transportation Living arrangements for the past 2 months:  [place] of Information:  Adult Children Lance Kemp) Patient Interpreter Needed:  None Criminal Activity/Legal Involvement Pertinent to Current Situation/Hospitalization:    Significant Relationships:  Adult Children Lance Kemp) Lives with:  Facility Resident Do you feel safe going back to the place where you live?  Yes Need for family participation in patient care:  Yes (Comment)  Care giving concerns:  Unknown at this time.    Social Worker assessment / plan:  CSW contacted patients son, Lance Kemp, to confirm patients current living arrangement. Patient is currently living at [facility] and has been at their assisted living facility for 3 years. Patient will be returning once medical stable. Son, Lance Kemp, would like to be updated on patients discharge status.  Employment status:  Retired Forensic scientist:  Medicare PT Recommendations:  Not assessed at this time Information / Referral to community resources:     Patient/Family's Response to care:  Unknown at this time.   Patient/Family's Understanding of and Emotional Response to Diagnosis, Current Treatment, and Prognosis: Unknown at this time.   Emotional Assessment Appearance:  Appears stated age Attitude/Demeanor/Rapport:    Affect (typically observed):  Pleasant, Accepting, Calm Orientation:  Oriented to Self, Oriented to Place, Oriented to  Time, Oriented to Situation Alcohol / Substance use:     Psych involvement (Current and /or in the community):     Discharge Needs  Concerns to be addressed:  No discharge needs identified Readmission within the last 30 days:  No Current discharge risk:  None Barriers to Discharge:  No Barriers Identified   Weston Anna, LCSW 04/25/2016, 10:41 PM

## 2016-04-26 ENCOUNTER — Observation Stay (HOSPITAL_BASED_OUTPATIENT_CLINIC_OR_DEPARTMENT_OTHER): Payer: Medicare Other

## 2016-04-26 DIAGNOSIS — Z881 Allergy status to other antibiotic agents status: Secondary | ICD-10-CM | POA: Diagnosis not present

## 2016-04-26 DIAGNOSIS — I252 Old myocardial infarction: Secondary | ICD-10-CM | POA: Diagnosis not present

## 2016-04-26 DIAGNOSIS — D649 Anemia, unspecified: Secondary | ICD-10-CM | POA: Diagnosis present

## 2016-04-26 DIAGNOSIS — G629 Polyneuropathy, unspecified: Secondary | ICD-10-CM | POA: Diagnosis present

## 2016-04-26 DIAGNOSIS — I5032 Chronic diastolic (congestive) heart failure: Secondary | ICD-10-CM

## 2016-04-26 DIAGNOSIS — M7989 Other specified soft tissue disorders: Secondary | ICD-10-CM

## 2016-04-26 DIAGNOSIS — N183 Chronic kidney disease, stage 3 (moderate): Secondary | ICD-10-CM

## 2016-04-26 DIAGNOSIS — I130 Hypertensive heart and chronic kidney disease with heart failure and stage 1 through stage 4 chronic kidney disease, or unspecified chronic kidney disease: Secondary | ICD-10-CM | POA: Diagnosis present

## 2016-04-26 DIAGNOSIS — E876 Hypokalemia: Secondary | ICD-10-CM | POA: Diagnosis present

## 2016-04-26 DIAGNOSIS — Z8673 Personal history of transient ischemic attack (TIA), and cerebral infarction without residual deficits: Secondary | ICD-10-CM | POA: Diagnosis not present

## 2016-04-26 DIAGNOSIS — L03116 Cellulitis of left lower limb: Secondary | ICD-10-CM | POA: Diagnosis present

## 2016-04-26 DIAGNOSIS — F0390 Unspecified dementia without behavioral disturbance: Secondary | ICD-10-CM | POA: Diagnosis present

## 2016-04-26 DIAGNOSIS — I872 Venous insufficiency (chronic) (peripheral): Secondary | ICD-10-CM | POA: Diagnosis present

## 2016-04-26 DIAGNOSIS — Z888 Allergy status to other drugs, medicaments and biological substances status: Secondary | ICD-10-CM | POA: Diagnosis not present

## 2016-04-26 DIAGNOSIS — Z66 Do not resuscitate: Secondary | ICD-10-CM | POA: Diagnosis present

## 2016-04-26 DIAGNOSIS — Z8249 Family history of ischemic heart disease and other diseases of the circulatory system: Secondary | ICD-10-CM | POA: Diagnosis not present

## 2016-04-26 DIAGNOSIS — Z87891 Personal history of nicotine dependence: Secondary | ICD-10-CM | POA: Diagnosis not present

## 2016-04-26 DIAGNOSIS — M1990 Unspecified osteoarthritis, unspecified site: Secondary | ICD-10-CM | POA: Diagnosis present

## 2016-04-26 DIAGNOSIS — Z809 Family history of malignant neoplasm, unspecified: Secondary | ICD-10-CM | POA: Diagnosis not present

## 2016-04-26 DIAGNOSIS — Z7982 Long term (current) use of aspirin: Secondary | ICD-10-CM | POA: Diagnosis not present

## 2016-04-26 DIAGNOSIS — Z85828 Personal history of other malignant neoplasm of skin: Secondary | ICD-10-CM | POA: Diagnosis not present

## 2016-04-26 LAB — BASIC METABOLIC PANEL
Anion gap: 9 (ref 5–15)
BUN: 14 mg/dL (ref 6–20)
CALCIUM: 8.9 mg/dL (ref 8.9–10.3)
CO2: 29 mmol/L (ref 22–32)
CREATININE: 1.15 mg/dL (ref 0.61–1.24)
Chloride: 104 mmol/L (ref 101–111)
GFR calc non Af Amer: 54 mL/min — ABNORMAL LOW (ref >60)
Glucose, Bld: 111 mg/dL — ABNORMAL HIGH (ref 65–99)
Potassium: 3.2 mmol/L — ABNORMAL LOW (ref 3.5–5.1)
SODIUM: 142 mmol/L (ref 135–145)

## 2016-04-26 LAB — CBC
HCT: 36.8 % — ABNORMAL LOW (ref 39.0–52.0)
Hemoglobin: 12.3 g/dL — ABNORMAL LOW (ref 13.0–17.0)
MCH: 30.6 pg (ref 26.0–34.0)
MCHC: 33.4 g/dL (ref 30.0–36.0)
MCV: 91.5 fL (ref 78.0–100.0)
PLATELETS: 216 10*3/uL (ref 150–400)
RBC: 4.02 MIL/uL — AB (ref 4.22–5.81)
RDW: 13.4 % (ref 11.5–15.5)
WBC: 8.9 10*3/uL (ref 4.0–10.5)

## 2016-04-26 MED ORDER — POTASSIUM CHLORIDE 20 MEQ/15ML (10%) PO SOLN
40.0000 meq | Freq: Every day | ORAL | Status: AC
  Administered 2016-04-26 – 2016-04-27 (×2): 40 meq via ORAL
  Filled 2016-04-26 (×2): qty 30

## 2016-04-26 NOTE — Progress Notes (Signed)
*  PRELIMINARY RESULTS* Vascular Ultrasound Left lower extremity venous duplex has been completed.  Preliminary findings: technically difficult to evaluate venous system due to patient violently coughing through test. No obvious evidence of DVT or baker's cyst.   Landry Mellow, RDMS, RVT  04/26/2016, 11:19 AM

## 2016-04-26 NOTE — Progress Notes (Signed)
Arrived at room for Venous Duplex. 9:30 am, patient was being bathed. Was told it would be 10 min. Checked back in at 9:45 am, was told again it would be 10 min.   Will check back for test as time permits.   Landry Mellow, RDMS, RVT

## 2016-04-26 NOTE — Progress Notes (Signed)
LCSWA spoke with  [name] at [facility]. The facility expects patient to return once medically stable. LCSWA will continue to follow and assist with patient disposition.

## 2016-04-26 NOTE — Progress Notes (Signed)
PROGRESS NOTE    Lance Kemp  [id number] DOB: 01-Feb-1923 DOA: 04/25/2016 PCP: [name], MD   Brief Narrative: 82 y.o. male with history of CHF, chronic venous insufficiency, hypertension, memory issues and chronic anemia presents to the ER because of sudden worsening of swelling and erythema and pain of the left lower extremity.  Assessment & Plan:   # Cellulitis of left lower extremity: Doppler ultrasound negative for DVT. Continue IV vancomycin. Observe for clinical response. Continue supportive care and keep left leg raised while on bed. May be able to switch to oral antibiotics tomorrow if improvement in rash.  # Essential hypertension: Monitor blood pressure. Currently on Lasix, hydralazine.  #Chronic diastolic heart failure ([place]): Looks euvolemic. Continue oral Lasix.  #Hypokalemia: Replete his potassium. Repeat lab in the morning.  Active Problems:   Venous insufficiency of both lower extremities   Short-term memory loss   History of CVA (cerebrovascular accident)   Chronic renal insufficiency, stage III (moderate): Serum creatinine level around baseline.    DVT prophylaxis: Lovenox subcutaneous Code Status: DO NOT RESUSCITATE Family Communication: No family present at bedside Disposition Plan: Likely discharge to SNF in 1-2 days    Consultants:   None  Procedures: None Antimicrobials: IV vancomycin  Subjective: Patient was seen and examined at bedside. Patient still has left leg pain and erythema. Denied fever, chills, nausea, vomiting, chest pain or shortness of breath.   Objective: Vitals:   04/25/16 2130 04/25/16 2328 04/26/16 0444 04/26/16 1302  BP: (!) 146/53 (!) 155/69 (!) 152/79 (!) 143/66  Pulse:  69 79 64  Resp: 10 16 18 18   Temp:   97.4 F (36.3 C) 98.1 F (36.7 C)  TempSrc:  Oral Oral Oral  SpO2:  100% 97% 98%  Weight:      Height:        Intake/Output Summary (Last 24 hours) at 04/26/16 1549 Last data filed at 04/26/16  0833  Gross per 24 hour  Intake              360 ml  Output              750 ml  Net             -390 ml   Filed Weights   04/25/16 1547  Weight: 70.8 kg (156 lb)    Examination:  General exam: Appears calm and comfortable  Respiratory system: Clear to auscultation. Respiratory effort normal. No wheezing or crackle Cardiovascular system: S1 & S2 heard, RRR.  No pedal edema. Gastrointestinal system: Abdomen is nondistended, soft and nontender. Normal bowel sounds heard. Central nervous system: Alert and oriented. No focal neurological deficits. Extremities: Symmetric 5 x 5 power. Skin: Left leg erythema, tenderness and warmth especially in the anterior shin up to knee. Psychiatry: Judgement and insight appear normal. Mood & affect appropriate.     Data Reviewed: I have personally reviewed following labs and imaging studies  CBC:  Recent Labs Lab 04/25/16 1805 04/26/16 0515  WBC 8.0 8.9  NEUTROABS 3.9  --   HGB 12.2* 12.3*  HCT 36.2* 36.8*  MCV 90.7 91.5  PLT 176 123XX123   Basic Metabolic Panel:  Recent Labs Lab 04/25/16 1805 04/26/16 0515  NA 141 142  K 3.3* 3.2*  CL 104 104  CO2 29 29  GLUCOSE 115* 111*  BUN 17 14  CREATININE 1.20 1.15  CALCIUM 8.9 8.9   GFR: Estimated Creatinine Clearance: 39.1 mL/min (by C-G formula based on SCr of 1.15  mg/dL). Liver Function Tests: No results for input(s): AST, ALT, ALKPHOS, BILITOT, PROT, ALBUMIN in the last 168 hours. No results for input(s): LIPASE, AMYLASE in the last 168 hours. No results for input(s): AMMONIA in the last 168 hours. Coagulation Profile: No results for input(s): INR, PROTIME in the last 168 hours. Cardiac Enzymes: No results for input(s): CKTOTAL, CKMB, CKMBINDEX, TROPONINI in the last 168 hours. BNP (last 3 results) No results for input(s): PROBNP in the last 8760 hours. HbA1C: No results for input(s): HGBA1C in the last 72 hours. CBG: No results for input(s): GLUCAP in the last 168  hours. Lipid Profile: No results for input(s): CHOL, HDL, LDLCALC, TRIG, CHOLHDL, LDLDIRECT in the last 72 hours. Thyroid Function Tests: No results for input(s): TSH, T4TOTAL, FREET4, T3FREE, THYROIDAB in the last 72 hours. Anemia Panel: No results for input(s): VITAMINB12, FOLATE, FERRITIN, TIBC, IRON, RETICCTPCT in the last 72 hours. Sepsis Labs: No results for input(s): PROCALCITON, LATICACIDVEN in the last 168 hours.  No results found for this or any previous visit (from the past 240 hour(s)).       Radiology Studies: No results found.      Scheduled Meds: . aspirin  81 mg Oral Daily  . atorvastatin  20 mg Oral QHS  . enoxaparin (LOVENOX) injection  40 mg Subcutaneous QHS  . finasteride  5 mg Oral Daily  . furosemide  40 mg Oral Daily  . hydrALAZINE  10 mg Oral TID  . levobunolol  1 drop Both Eyes BID  . mirabegron ER  25 mg Oral Daily  . pantoprazole  40 mg Oral Daily  . polyethylene glycol  17 g Oral Daily  . potassium chloride  40 mEq Oral Daily  . tamsulosin  0.8 mg Oral QHS  . vancomycin  750 mg Intravenous Q24H   Continuous Infusions:   LOS: 0 days    Time spent: 26 minutes.    Kayra Crowell Tanna Furry, MD [facility] Pager [phone]  If 7PM-7AM, please contact night-coverage [email or web address] Password [facility] 04/26/2016, 3:49 PM

## 2016-04-27 LAB — BASIC METABOLIC PANEL
ANION GAP: 8 (ref 5–15)
BUN: 15 mg/dL (ref 6–20)
CALCIUM: 8.7 mg/dL — AB (ref 8.9–10.3)
CO2: 29 mmol/L (ref 22–32)
CREATININE: 1.13 mg/dL (ref 0.61–1.24)
Chloride: 104 mmol/L (ref 101–111)
GFR, EST NON AFRICAN AMERICAN: 55 mL/min — AB (ref >60)
Glucose, Bld: 110 mg/dL — ABNORMAL HIGH (ref 65–99)
Potassium: 3.6 mmol/L (ref 3.5–5.1)
SODIUM: 141 mmol/L (ref 135–145)

## 2016-04-27 MED ORDER — DOXYCYCLINE HYCLATE 100 MG PO CAPS: 100.0000 mg | ORAL_CAPSULE | Freq: Two times a day (BID) | ORAL | 0 refills | Status: AC

## 2016-04-27 MED ORDER — DOXYCYCLINE HYCLATE 100 MG PO CAPS: 100.0000 mg | ORAL_CAPSULE | Freq: Two times a day (BID) | ORAL | 0 refills | Status: DC

## 2016-04-27 NOTE — Progress Notes (Addendum)
LCSWA confirmed pt.son will transport pt. Back to facility. RN notified to give packet on arrival. Nurse given report # No other needs identified.   Kathrin Greathouse, Latanya Presser, MSW Clinical Social Worker 5E and Psychiatric Service Line [phone] 04/27/2016  2:46 PM

## 2016-04-27 NOTE — Progress Notes (Signed)
Report given to [place] at [facility]. Left phone number incase she has any further questions.

## 2016-04-27 NOTE — Discharge Summary (Addendum)
Physician Discharge Summary  Lance Kemp [id number] DOB: 09/08/1922 DOA: 04/25/2016  PCP: [name], MD  Admit date: 04/25/2016 Discharge date: 04/27/2016  Admitted From: ALF Disposition: ALF  Recommendations for Outpatient Follow-up:  1. Follow up with PCP in 1-2 weeks 2. Please obtain BMP/CBC in one week   Home Health:no Equipment/Devices:no Discharge Condition:stable CODE STATUS:DNR Diet recommendation:heart healthy  Brief/Interim Summary:82 y.o.malewith history of CHF, chronic venous insufficiency, hypertension, memory issues and chronic anemia presents to the ER because of sudden worsening of swelling and erythema and pain of the left lower extremity.  # Cellulitis of left lower extremity: Doppler ultrasound negative for DVT. Treated with IV vancomycin in the hospital with improvement in the past. Advised to provide supportive care and to elevate legs while in bed. Plan to discharge with oral doxycycline for 5 days.  # Essential hypertension: Monitor blood pressure. Currently on Lasix, hydralazine.  #Chronic diastolic heart failure ([place]): Looks euvolemic. Continue oral Lasix.  #Hypokalemia: Improved.  Patient reported feeling much better. His left lower extremity erythema significantly improved. Discussed with the Education officer, museum. I think patient is medically stable to transfer his care to Assisted living facility with outpatient follow-up.  Discharge Diagnoses:  Principal Problem:   Cellulitis of left lower extremity Active Problems:   Venous insufficiency of both lower extremities   HTN (hypertension), benign   Short-term memory loss   History of CVA (cerebrovascular accident)   Chronic renal insufficiency, stage III (moderate)   Chronic diastolic heart failure (HCC)   Cellulitis    Discharge Instructions  Discharge Instructions    Call MD for:  difficulty breathing, headache or visual disturbances    Complete by:  As directed    Call MD for:   hives    Complete by:  As directed    Call MD for:  persistant dizziness or light-headedness    Complete by:  As directed    Call MD for:  persistant nausea and vomiting    Complete by:  As directed    Call MD for:  temperature >100.4    Complete by:  As directed    Diet - low sodium heart healthy    Complete by:  As directed    Increase activity slowly    Complete by:  As directed        Medication List    TAKE these medications   aspirin 81 MG chewable tablet Chew 81 mg by mouth daily.   atorvastatin 20 MG tablet Commonly known as:  LIPITOR Take 20 mg by mouth at bedtime.   azelastine 0.05 % ophthalmic solution Commonly known as:  OPTIVAR Place 1 drop into both eyes 2 (two) times daily.   doxycycline 100 MG capsule Commonly known as:  VIBRAMYCIN Take 1 capsule (100 mg total) by mouth 2 (two) times daily.   finasteride 5 MG tablet Commonly known as:  PROSCAR Take 1 tablet (5 mg total) by mouth daily.   furosemide 40 MG tablet Commonly known as:  LASIX Take 40 mg by mouth daily.   hydrALAZINE 10 MG tablet Commonly known as:  APRESOLINE Take 10 mg by mouth 3 (three) times daily.   HYDROcodone-acetaminophen 5-325 MG tablet Commonly known as:  NORCO/VICODIN Take 1 tablet by mouth every 6 (six) hours as needed for moderate pain.   levobunolol 0.5 % ophthalmic solution Commonly known as:  BETAGAN Place 1 drop into both eyes 2 (two) times daily.   meclizine 50 MG tablet Commonly known as:  ANTIVERT Take 1  tablet (50 mg total) by mouth 3 (three) times daily as needed for dizziness.   mirabegron ER 25 MG Tb24 tablet Commonly known as:  MYRBETRIQ Take 1 tablet (25 mg total) by mouth daily.   pantoprazole 40 MG tablet Commonly known as:  PROTONIX Take 1 tablet (40 mg total) by mouth daily.   polyethylene glycol packet Commonly known as:  MIRALAX / GLYCOLAX Take 17 g by mouth daily.   tamsulosin 0.4 MG Caps capsule Commonly known as:  FLOMAX Take 0.8 mg by  mouth at bedtime.      Follow-up Information    MCGOWEN,PHILIP H, MD. Schedule an appointment as soon as possible for a visit in 1 week(s).   Specialty:  Family Medicine Contact information: [address] [place] [address] [phone]          Allergies  Allergen Reactions  . Altace [Ramipril] Other (See Comments)    Reaction:  Unknown   . Amoxicillin Other (See Comments)    Reaction:  Unknown  Has patient had a PCN reaction causing immediate rash, facial/tongue/throat swelling, SOB or lightheadedness with hypotension: Unsure Has patient had a PCN reaction causing severe rash involving mucus membranes or skin necrosis: Unsure Has patient had a PCN reaction that required hospitalization Unsure Has patient had a PCN reaction occurring within the last 10 years: Unsure If all of the above answers are "NO", then may proceed with Cephalosporin use.  Blanchard Kelch [Losartan Potassium] Other (See Comments)    Reaction:  Unknown     Consultations: None  Procedures/Studies: None  Subjective: Patient was seen and examined at bedside. Patient denied fever, chills, nausea, vomiting, chest pain, shortness of breath. He reported that left lower extremity pain and swelling has significantly improved. He wants to go to nursing home today.   Discharge Exam: Vitals:   04/27/16 0515 04/27/16 0600  BP: (!) 144/55   Pulse: (!) 58 64  Resp: 18   Temp: 98.3 F (36.8 C)    Vitals:   04/26/16 1302 04/26/16 2134 04/27/16 0515 04/27/16 0600  BP: (!) 143/66 132/60 (!) 144/55   Pulse: 64 64 (!) 58 64  Resp: 18 18 18    Temp: 98.1 F (36.7 C) 97.8 F (36.6 C) 98.3 F (36.8 C)   TempSrc: Oral Oral Oral   SpO2: 98% 96% 99%   Weight:      Height:        General: Pt is alert, awake, not in acute distress Cardiovascular: RRR, S1/S2 +, no rubs, no gallops Respiratory: CTA bilaterally, no wheezing, no rhonchi Abdominal: Soft, NT, ND, bowel sounds + Extremities: no edema, no  cyanosis.Left lower extremity erythema improving. Mild warmth. No tenderness.    The results of significant diagnostics from this hospitalization (including imaging, microbiology, ancillary and laboratory) are listed below for reference.     Microbiology: No results found for this or any previous visit (from the past 240 hour(s)).   Labs: BNP (last 3 results)  Recent Labs  04/19/16 0706  BNP 123XX123   Basic Metabolic Panel:  Recent Labs Lab 04/25/16 1805 04/26/16 0515 04/27/16 0510  NA 141 142 141  K 3.3* 3.2* 3.6  CL 104 104 104  CO2 29 29 29   GLUCOSE 115* 111* 110*  BUN 17 14 15   CREATININE 1.20 1.15 1.13  CALCIUM 8.9 8.9 8.7*   Liver Function Tests: No results for input(s): AST, ALT, ALKPHOS, BILITOT, PROT, ALBUMIN in the last 168 hours. No results for input(s):  LIPASE, AMYLASE in the last 168 hours. No results for input(s): AMMONIA in the last 168 hours. CBC:  Recent Labs Lab 04/25/16 1805 04/26/16 0515  WBC 8.0 8.9  NEUTROABS 3.9  --   HGB 12.2* 12.3*  HCT 36.2* 36.8*  MCV 90.7 91.5  PLT 176 216   Cardiac Enzymes: No results for input(s): CKTOTAL, CKMB, CKMBINDEX, TROPONINI in the last 168 hours. BNP: Invalid input(s): POCBNP CBG: No results for input(s): GLUCAP in the last 168 hours. D-Dimer No results for input(s): DDIMER in the last 72 hours. Hgb A1c No results for input(s): HGBA1C in the last 72 hours. Lipid Profile No results for input(s): CHOL, HDL, LDLCALC, TRIG, CHOLHDL, LDLDIRECT in the last 72 hours. Thyroid function studies No results for input(s): TSH, T4TOTAL, T3FREE, THYROIDAB in the last 72 hours.  Invalid input(s): FREET3 Anemia work up No results for input(s): VITAMINB12, FOLATE, FERRITIN, TIBC, IRON, RETICCTPCT in the last 72 hours. Urinalysis    Component Value Date/Time   COLORURINE YELLOW 04/25/2016 1833   APPEARANCEUR CLEAR 04/25/2016 1833   LABSPEC 1.007 04/25/2016 1833   PHURINE 7.5 04/25/2016 1833   GLUCOSEU  NEGATIVE 04/25/2016 1833   HGBUR NEGATIVE 04/25/2016 1833   BILIRUBINUR NEGATIVE 04/25/2016 1833   BILIRUBINUR neg 09/24/2015 1504   KETONESUR NEGATIVE 04/25/2016 1833   PROTEINUR NEGATIVE 04/25/2016 1833   UROBILINOGEN 0.2 09/24/2015 1504   UROBILINOGEN 0.2 09/10/2014 1052   NITRITE NEGATIVE 04/25/2016 1833   LEUKOCYTESUR NEGATIVE 04/25/2016 1833   Sepsis Labs Invalid input(s): PROCALCITONIN,  WBC,  LACTICIDVEN Microbiology No results found for this or any previous visit (from the past 240 hour(s)).   Time coordinating discharge: 26 minutes  SIGNED:   Rosita Fire, MD  [facility] 04/27/2016, 11:27 AM  If 7PM-7AM, please contact night-coverage [email or web address] Password [email or web address]

## 2016-04-27 NOTE — Progress Notes (Signed)
[facility] faxed clinical information to facility. Waiting for pt. Son to return call to inform him of pt. D/c and return to ALF facility.   Kathrin Greathouse, Latanya Presser, MSW Clinical Social Worker 5E and Psychiatric Service Line [phone] 04/27/2016  1:00 PM

## 2016-04-27 NOTE — Consult Note (Signed)
   [facility] Inpatient Consult   04/27/2016  Taku Pepin [birth year] [id number]   Patient screened for potential [facility] Care Management services. Chart reviewed. Noted current discharge plan is to return to ALF today.  There are no identifiable [facility] Care Management needs at this time. If patient's post hospital needs change, please place a [facility] Care Management consult. For questions please contact:  Marthenia Rolling, [place], RN,BSN [facility] Liaison [phone]

## 2016-04-27 NOTE — NC FL2 (Signed)
[place] LEVEL OF CARE SCREENING TOOL     IDENTIFICATION  Patient Name: Lance Kemp Birthdate: March 17, 1923 Sex: male Admission Date (Current Location): 04/25/2016  [facility] and [place] Number:  Herbalist and Address:  [facility],  [place] [address] [place]      Provider Number: [id number]  Attending Physician Name and Address:  Rosita Fire, MD  Relative Name and Phone Number:       Current Level of Care: Hospital Recommended Level of Care: [place] Prior Approval Number:    Date Approved/Denied:   PASRR Number:    Discharge Plan:  ([place] )    Current Diagnoses: Patient Active Problem List   Diagnosis Date Noted  . Cellulitis of left lower extremity 04/25/2016  . Cellulitis 04/25/2016  . Basal cell carcinoma, face 04/07/2015  . Mild dementia 11/24/2014  . OAB (overactive bladder) 10/17/2014  . Essential hypertension 08/25/2014  . Hyperlipidemia 08/25/2014  . Peripheral edema 04/16/2014  . Chronic diastolic heart failure ([place]) 03/04/2014  . Chronic renal insufficiency, stage III (moderate)   . Chronic shoulder pain 02/19/2014  . SOB (shortness of breath) 02/18/2014  . Acute on chronic diastolic heart failure ([place]) 12/24/2013  . Lumbago 12/18/2013  . History of CVA (cerebrovascular accident) 11/21/2013  . BPH associated with nocturia 11/21/2013  . Short-term memory loss 10/24/2013  . Chronic left shoulder pain 09/14/2013  . Venous insufficiency of both lower extremities 09/14/2013  . HTN (hypertension), benign 09/14/2013    Orientation RESPIRATION BLADDER Height & Weight     Self, Place, Situation  Normal Continent Weight: 156 lb (70.8 kg) Height:  5\' 7"  (170.2 cm)  BEHAVIORAL SYMPTOMS/MOOD NEUROLOGICAL BOWEL NUTRITION STATUS      Continent Diet (See Discharge Summary )  AMBULATORY STATUS COMMUNICATION OF NEEDS Skin   Limited Assist Verbally Normal, Other (Comment)  (Cellulitis of Lower left extremity )                       Personal Care Assistance Level of Assistance  Bathing, Feeding, Dressing Bathing Assistance: Limited assistance Feeding assistance: Independent Dressing Assistance: Independent     Functional Limitations Info  Sight, Hearing, Speech Sight Info: Adequate Hearing Info: Adequate Speech Info: Adequate    SPECIAL CARE FACTORS FREQUENCY                       Contractures Contractures Info: Not present    Additional Factors Info  Code Status Code Status Info: DNR             Current Medications (04/27/2016):  This is the current hospital active medication list Current Facility-Administered Medications  Medication Dose Route Frequency Provider Last Rate Last Dose  . acetaminophen (TYLENOL) tablet 650 mg  650 mg Oral Q6H PRN Rise Patience, MD       Or  . acetaminophen (TYLENOL) suppository 650 mg  650 mg Rectal Q6H PRN Rise Patience, MD      . aspirin chewable tablet 81 mg  81 mg Oral Daily Rise Patience, MD   81 mg at 04/26/16 1057  . atorvastatin (LIPITOR) tablet 20 mg  20 mg Oral QHS Rise Patience, MD   20 mg at 04/26/16 2226  . enoxaparin (LOVENOX) injection 40 mg  40 mg Subcutaneous QHS Rise Patience, MD   40 mg at 04/26/16 2227  . finasteride (PROSCAR) tablet 5 mg  5  mg Oral Daily Rise Patience, MD   5 mg at 04/26/16 1055  . furosemide (LASIX) tablet 40 mg  40 mg Oral Daily Rise Patience, MD   40 mg at 04/26/16 1053  . hydrALAZINE (APRESOLINE) tablet 10 mg  10 mg Oral TID Rise Patience, MD   10 mg at 04/26/16 2227  . HYDROcodone-acetaminophen (NORCO/VICODIN) 5-325 MG per tablet 1 tablet  1 tablet Oral Q6H PRN Rise Patience, MD      . levobunolol (BETAGAN) 0.5 % ophthalmic solution 1 drop  1 drop Both Eyes BID Rise Patience, MD   1 drop at 04/26/16 2227  . meclizine (ANTIVERT) tablet 50 mg  50 mg Oral TID PRN Rise Patience, MD      .  mirabegron ER [employer]) tablet 25 mg  25 mg Oral Daily Rise Patience, MD   25 mg at 04/26/16 1056  . ondansetron (ZOFRAN) tablet 4 mg  4 mg Oral Q6H PRN Rise Patience, MD       Or  . ondansetron [facility]) injection 4 mg  4 mg Intravenous Q6H PRN Rise Patience, MD      . pantoprazole (PROTONIX) EC tablet 40 mg  40 mg Oral Daily Rise Patience, MD   40 mg at 04/26/16 1054  . polyethylene glycol (MIRALAX / GLYCOLAX) packet 17 g  17 g Oral Daily Rise Patience, MD   17 g at 04/26/16 1055  . potassium chloride 20 MEQ/15ML (10%) solution 40 mEq  40 mEq Oral Daily Dron Tanna Furry, MD   40 mEq at 04/26/16 1055  . tamsulosin (FLOMAX) capsule 0.8 mg  0.8 mg Oral QHS Rise Patience, MD   0.8 mg at 04/26/16 2227  . vancomycin (VANCOCIN) IVPB 750 mg/150 ml premix  750 mg Intravenous Q24H Randa Spike, RPH   750 mg at 04/26/16 2004     Discharge Medications: Please see discharge summary for a list of discharge medications.  TAKE these medications   aspirin 81 MG chewable tablet Chew 81 mg by mouth daily.  atorvastatin 20 MG tablet Commonly known as:  LIPITOR Take 20 mg by mouth at bedtime.  azelastine 0.05 % ophthalmic solution Commonly known as:  OPTIVAR Place 1 drop into both eyes 2 (two) times daily.  doxycycline 100 MG capsule Commonly known as:  VIBRAMYCIN Take 1 capsule (100 mg total) by mouth 2 (two) times daily.  finasteride 5 MG tablet Commonly known as:  PROSCAR Take 1 tablet (5 mg total) by mouth daily.  furosemide 40 MG tablet Commonly known as:  LASIX Take 40 mg by mouth daily.  hydrALAZINE 10 MG tablet Commonly known as:  APRESOLINE Take 10 mg by mouth 3 (three) times daily.  HYDROcodone-acetaminophen 5-325 MG tablet Commonly known as:  NORCO/VICODIN Take 1 tablet by mouth every 6 (six) hours as needed for moderate pain.  levobunolol 0.5 % ophthalmic solution Commonly known as:  BETAGAN Place 1 drop into both eyes 2 (two) times  daily.  meclizine 50 MG tablet Commonly known as:  ANTIVERT Take 1 tablet (50 mg total) by mouth 3 (three) times daily as needed for dizziness.  mirabegron ER 25 MG Tb24 tablet Commonly known as:  MYRBETRIQ Take 1 tablet (25 mg total) by mouth daily.  pantoprazole 40 MG tablet Commonly known as:  PROTONIX Take 1 tablet (40 mg total) by mouth daily.  polyethylene glycol packet Commonly known as:  MIRALAX / GLYCOLAX Take 17 g by  mouth daily.  tamsulosin 0.4 MG Caps capsule Commonly known as:  FLOMAX Take 0.8 mg by mouth at bedtime.     Relevant Imaging Results:  Relevant Lab Results:   Additional Information [id number]  Lia Hopping, LCSW

## 2016-04-27 NOTE — Progress Notes (Signed)
Initial Nutrition Assessment  DOCUMENTATION CODES:   Not applicable  INTERVENTION:  If patient does not end up discharging today, will order Ensure Enlive BID.  Encouraged adequate intake of protein and calories with meals. Reviewed options patient can choose if he does not like the meal served that day (sandwiches, yogurt, cottage cheese). Also encouraged patient to talk to Registered Dietitian at facility.  NUTRITION DIAGNOSIS:   Inadequate oral intake related to poor appetite (does not like food at [facility]) as evidenced by per patient/family report.  GOAL:   Patient will meet greater than or equal to 90% of their needs  MONITOR:   PO intake, Labs, Weight trends, Supplement acceptance, I & O's  REASON FOR ASSESSMENT:   Malnutrition Screening Tool    ASSESSMENT:   82 y.o. male with history of CHF, chronic venous insufficiency, hypertension, memory issues and chronic anemia presents to the ER because of sudden worsening of swelling and erythema and pain of the left lower extremity.  Patient reports appetite is "not too hearty," but the reason for his poor intake is that he does not like the food provided at [facility] facility. He reports he has enjoyed the food here so he has been eating better. At [facility], he reports drinking at least 2 Ensure daily, but sometimes up to 3 or 4 if he does not like the food served that day. Reports weight has been stable at about 155 lbs with no weight loss. Patient does not want RD to order Ensure because he reports he is leaving soon and he has some in his refrigerator at the facility that his son purchased for him.  Meal Completion: 0-75%  Medications reviewed and include: Lasix 40 mg daily, pantoprazole, Miralax, potassium chloride 40 mEq daily.  Labs reviewed: Potassium now WNL (was low 11/7 & 11/8), Glucose 110.   Patient refused Nutrition-Focused Physical Exam. No overt signs of wasting noted on face, shoulders,  arms that RD could visualize.  Discussed with RN. Patient likely discharging today back to [facility].   Diet Order:  Diet Heart Room service appropriate? Yes; Fluid consistency: Thin  Skin:  Reviewed, no issues  Last BM:  04/26/2016  Height:   Ht Readings from Last 1 Encounters:  04/25/16 5\' 7"  (1.702 m)    Weight:   Wt Readings from Last 1 Encounters:  04/25/16 156 lb (70.8 kg)    Ideal Body Weight:  67.27 kg  BMI:  Body mass index is 24.43 kg/m.  Estimated Nutritional Needs:   Kcal:  1595-1730 (MSJ x 1.2-1.3)  Protein:  70-85 grams (1-1.2 grams/kg)  Fluid:  >/= 1.7 L/day (25 ml/kg)  EDUCATION NEEDS:   No education needs identified at this time  Willey Blade, MS, RD, LDN Pager: [phone] After Hours Pager: [phone]

## 2016-04-28 ENCOUNTER — Telehealth: Payer: Self-pay

## 2016-04-28 NOTE — Telephone Encounter (Signed)
Called patient's son Gerald Stabs, left message to return call to complete TCM call and confirm upcoming appt.

## 2016-05-01 NOTE — Telephone Encounter (Signed)
2nd attempt for TCM call, LM for patients son (christ) to return call. Will need TCM and confirm upcoming appt.

## 2016-05-02 NOTE — Telephone Encounter (Signed)
3rd attempt for TCM call. LM for patients son (chris) to return call. Will need TCM and confirm upcoming appt.

## 2016-05-04 ENCOUNTER — Encounter: Payer: Self-pay | Admitting: Family Medicine

## 2016-05-04 ENCOUNTER — Ambulatory Visit (INDEPENDENT_AMBULATORY_CARE_PROVIDER_SITE_OTHER): Payer: Medicare Other | Admitting: Family Medicine

## 2016-05-04 VITALS — BP 150/68 | HR 67 | Temp 98.0°F | Resp 20 | Wt 153.5 lb

## 2016-05-04 DIAGNOSIS — M791 Myalgia: Secondary | ICD-10-CM

## 2016-05-04 DIAGNOSIS — I10 Essential (primary) hypertension: Secondary | ICD-10-CM

## 2016-05-04 DIAGNOSIS — E876 Hypokalemia: Secondary | ICD-10-CM

## 2016-05-04 DIAGNOSIS — L03116 Cellulitis of left lower limb: Secondary | ICD-10-CM | POA: Diagnosis not present

## 2016-05-04 DIAGNOSIS — R351 Nocturia: Secondary | ICD-10-CM

## 2016-05-04 DIAGNOSIS — N401 Enlarged prostate with lower urinary tract symptoms: Secondary | ICD-10-CM

## 2016-05-04 DIAGNOSIS — N3941 Urge incontinence: Secondary | ICD-10-CM

## 2016-05-04 DIAGNOSIS — K219 Gastro-esophageal reflux disease without esophagitis: Secondary | ICD-10-CM

## 2016-05-04 DIAGNOSIS — R609 Edema, unspecified: Secondary | ICD-10-CM

## 2016-05-04 DIAGNOSIS — M7918 Myalgia, other site: Secondary | ICD-10-CM

## 2016-05-04 NOTE — Progress Notes (Signed)
OFFICE VISIT  05/04/2016   CC:  Chief Complaint  Patient presents with  . Hospitalization Follow-up   HPI:    Patient is a 82 y.o. Caucasian male who presents for hospital f/u, recent admission to [facility] for cellulitis of left lower extremity. Admitted 11/7-11/9, 2017.  D/c'd back to his ALF.   Reviewed hosp d/c summary/records.  Main issue from hosp to f/u on are infection and mild hypokalemia. He was sent home on 5 days of doxycycline, which he has finished.  IV Vanco was given in hospital.  He is getting ready to move into a house of his own right across the street from his son--getting out of ALF! Son wants him off hydrocodone: they had been giving him this bid at his ALF and he was oversedated.  He has stable urge incontinence and BPH obstructive sx's (mainly nocturia), that is complicated by the necessity of lasix every day.  ROS: no cough, no SOB, no CP, no n/v/d.  He has chronic left shoulder and LB pain. Chronic discomfort in both LL's.  Having daily HA's lately--start at top of head where he had a skin lesion removed recently and spread to involve entire head.     Past Medical History:  Diagnosis Date  . Basal cell carcinoma 2011; 2016   Face  . BPH with obstruction/lower urinary tract symptoms   . Chronic back pain   . Chronic diastolic heart failure ([place])   . Chronic left shoulder pain 09/14/2013  . Chronic renal insufficiency, stage III (moderate) 2013   CrCl in the 50s-60s  . Chronic venous insufficiency    compression hose  . 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  No significant residual deficit except short term memory impairment  . Dementia 2016   Dr. Delice Lesch 08/2014--started aricept but pt did not tolerate  . Depression    antidepressant x 1 yr--situational. Resolved 08/2013 per pt/son.  . Frequent headaches   . Glaucoma   . Hypertension   . Normocytic anemia 2015   secondary to CRI (iron studies ok 12/2013)  . NSTEMI (non-ST elevated myocardial  infarction) ([place]) 06/2011   [place]: Troponin mildly elevated, normal EKG, normal ECHO, pt declined cardiology consult--per old PCP records.  Marland Kitchen OAB (overactive bladder)   . Osteoarthritis    hands/wrists  . Peripheral neuropathy ([place]) 2013   Dx'd by neuro in [place] at the time the patient was hospitalized briefly for pontine CVA.  Marland Kitchen Shortness of breath   . Urine incontinence     Past Surgical History:  Procedure Laterality Date  . CHOLECYSTECTOMY    . NM VQ LUNG SCAN ([place] [facility])  09/2014   Low risk  . TONSILECTOMY, ADENOIDECTOMY, BILATERAL MYRINGOTOMY AND TUBES    . TRANSTHORACIC ECHOCARDIOGRAM  12/18/13   Grade 1 diast dysf, o/w normal    Outpatient Medications Prior to Visit  Medication Sig Dispense Refill  . aspirin 81 MG chewable tablet Chew 81 mg by mouth daily.    Marland Kitchen atorvastatin (LIPITOR) 20 MG tablet Take 20 mg by mouth at bedtime.     Marland Kitchen azelastine (OPTIVAR) 0.05 % ophthalmic solution Place 1 drop into both eyes 2 (two) times daily.     . finasteride (PROSCAR) 5 MG tablet Take 1 tablet (5 mg total) by mouth daily. 30 tablet 6  . furosemide (LASIX) 40 MG tablet Take 40 mg by mouth daily.    . hydrALAZINE (APRESOLINE) 10 MG tablet Take 10 mg by mouth 3 (three) times daily.     Marland Kitchen  levobunolol (BETAGAN) 0.5 % ophthalmic solution Place 1 drop into both eyes 2 (two) times daily.    . meclizine (ANTIVERT) 50 MG tablet Take 1 tablet (50 mg total) by mouth 3 (three) times daily as needed for dizziness. 8 tablet 0  . mirabegron ER (MYRBETRIQ) 25 MG TB24 tablet Take 1 tablet (25 mg total) by mouth daily. 30 tablet 12  . polyethylene glycol (MIRALAX / GLYCOLAX) packet Take 17 g by mouth daily.    . tamsulosin (FLOMAX) 0.4 MG CAPS capsule Take 0.8 mg by mouth at bedtime.    Marland Kitchen HYDROcodone-acetaminophen (NORCO/VICODIN) 5-325 MG tablet Take 1 tablet by mouth every 6 (six) hours as needed for moderate pain.    . pantoprazole (PROTONIX) 40 MG tablet Take 1 tablet (40 mg total) by mouth daily. 30  tablet 6   No facility-administered medications prior to visit.     Allergies  Allergen Reactions  . Altace [Ramipril] Other (See Comments)    Reaction:  Unknown   . Amoxicillin Other (See Comments)    Reaction:  Unknown  Has patient had a PCN reaction causing immediate rash, facial/tongue/throat swelling, SOB or lightheadedness with hypotension: Unsure Has patient had a PCN reaction causing severe rash involving mucus membranes or skin necrosis: Unsure Has patient had a PCN reaction that required hospitalization Unsure Has patient had a PCN reaction occurring within the last 10 years: Unsure If all of the above answers are "NO", then may proceed with Cephalosporin use.  Blanchard Kelch [Losartan Potassium] Other (See Comments)    Reaction:  Unknown     ROS As per HPI  PE: Blood pressure (!) 150/68, pulse 67, temperature 98 F (36.7 C), resp. rate 20, weight 153 lb 8 oz (69.6 kg), SpO2 96 %. Gen: Alert, well appearing.  Patient is oriented to person, place, time, and situation. CV: RRR, distant S1 and S2 no m/r/g.   LUNGS: CTA bilat, nonlabored resps, good aeration in all lung fields. EXT: 2+ pitting edema in both LL's, with hypersensitivity to palpation of LL's bilat.  No erythema, rash, or warmth of either lower leg.  LABS:    Chemistry      Component Value Date/Time   NA 141 04/27/2016 0510   K 3.6 04/27/2016 0510   CL 104 04/27/2016 0510   CO2 29 04/27/2016 0510   BUN 15 04/27/2016 0510   CREATININE 1.13 04/27/2016 0510   CREATININE 1.12 (H) 09/24/2015 1447      Component Value Date/Time   CALCIUM 8.7 (L) 04/27/2016 0510   ALKPHOS 80 05/27/2015 1444   AST 22 05/27/2015 1444   ALT 17 05/27/2015 1444   BILITOT 0.5 05/27/2015 1444     Lab Results  Component Value Date   WBC 8.9 04/26/2016   HGB 12.3 (L) 04/26/2016   HCT 36.8 (L) 04/26/2016   MCV 91.5 04/26/2016   PLT 216 04/26/2016   IMPRESSION AND PLAN:  1) Left lower leg cellulitis: resolved.  2) Bilat LE  venous insufficiency edema: stable on lasix 40mg  qd. Monitor BMET today.  3) Hx of GER/LPR: all pharyngeal sx's gone, pt can swallow w/out problem now: we'll d/c pantoprazole today.  4) Urge incontinence + BPH with nocturia: continue myrbetriq, flomax, and proscar.  5) Chronic musculoskeletal pain (+ HA's s/p scalp skin lesion removal lately): stop all hydrocodone--orders written today for his ALF.  He may have tylenol 1000 mg q6h prn.  6) HTN: stable on hydralazine and lasix.  An After Visit Summary was printed and  given to the patient.  FOLLOW UP: Return in about 3 months (around 08/04/2016) for routine chronic illness f/u.  Signed:  Crissie Sickles, MD           05/04/2016

## 2016-05-05 DIAGNOSIS — I872 Venous insufficiency (chronic) (peripheral): Secondary | ICD-10-CM | POA: Diagnosis not present

## 2016-05-05 DIAGNOSIS — N183 Chronic kidney disease, stage 3 (moderate): Secondary | ICD-10-CM | POA: Diagnosis not present

## 2016-05-05 DIAGNOSIS — Z85828 Personal history of other malignant neoplasm of skin: Secondary | ICD-10-CM | POA: Diagnosis not present

## 2016-05-05 DIAGNOSIS — D649 Anemia, unspecified: Secondary | ICD-10-CM | POA: Diagnosis not present

## 2016-05-05 DIAGNOSIS — Z7982 Long term (current) use of aspirin: Secondary | ICD-10-CM | POA: Diagnosis not present

## 2016-05-05 DIAGNOSIS — Z872 Personal history of diseases of the skin and subcutaneous tissue: Secondary | ICD-10-CM | POA: Diagnosis not present

## 2016-05-05 DIAGNOSIS — M1990 Unspecified osteoarthritis, unspecified site: Secondary | ICD-10-CM

## 2016-05-05 DIAGNOSIS — G8929 Other chronic pain: Secondary | ICD-10-CM

## 2016-05-05 DIAGNOSIS — F329 Major depressive disorder, single episode, unspecified: Secondary | ICD-10-CM | POA: Diagnosis not present

## 2016-05-05 DIAGNOSIS — E785 Hyperlipidemia, unspecified: Secondary | ICD-10-CM | POA: Diagnosis not present

## 2016-05-05 DIAGNOSIS — I130 Hypertensive heart and chronic kidney disease with heart failure and stage 1 through stage 4 chronic kidney disease, or unspecified chronic kidney disease: Secondary | ICD-10-CM | POA: Diagnosis not present

## 2016-05-05 DIAGNOSIS — I5032 Chronic diastolic (congestive) heart failure: Secondary | ICD-10-CM | POA: Diagnosis not present

## 2016-05-05 DIAGNOSIS — F0390 Unspecified dementia without behavioral disturbance: Secondary | ICD-10-CM

## 2016-05-08 DIAGNOSIS — I872 Venous insufficiency (chronic) (peripheral): Secondary | ICD-10-CM | POA: Diagnosis not present

## 2016-05-08 DIAGNOSIS — N183 Chronic kidney disease, stage 3 (moderate): Secondary | ICD-10-CM | POA: Diagnosis not present

## 2016-05-08 DIAGNOSIS — F329 Major depressive disorder, single episode, unspecified: Secondary | ICD-10-CM | POA: Diagnosis not present

## 2016-05-08 DIAGNOSIS — I130 Hypertensive heart and chronic kidney disease with heart failure and stage 1 through stage 4 chronic kidney disease, or unspecified chronic kidney disease: Secondary | ICD-10-CM | POA: Diagnosis not present

## 2016-05-08 DIAGNOSIS — I5032 Chronic diastolic (congestive) heart failure: Secondary | ICD-10-CM | POA: Diagnosis not present

## 2016-05-08 DIAGNOSIS — Z872 Personal history of diseases of the skin and subcutaneous tissue: Secondary | ICD-10-CM | POA: Diagnosis not present

## 2016-05-12 DIAGNOSIS — N183 Chronic kidney disease, stage 3 (moderate): Secondary | ICD-10-CM | POA: Diagnosis not present

## 2016-05-12 DIAGNOSIS — Z872 Personal history of diseases of the skin and subcutaneous tissue: Secondary | ICD-10-CM | POA: Diagnosis not present

## 2016-05-12 DIAGNOSIS — I5032 Chronic diastolic (congestive) heart failure: Secondary | ICD-10-CM | POA: Diagnosis not present

## 2016-05-12 DIAGNOSIS — F329 Major depressive disorder, single episode, unspecified: Secondary | ICD-10-CM | POA: Diagnosis not present

## 2016-05-12 DIAGNOSIS — I872 Venous insufficiency (chronic) (peripheral): Secondary | ICD-10-CM | POA: Diagnosis not present

## 2016-05-12 DIAGNOSIS — I130 Hypertensive heart and chronic kidney disease with heart failure and stage 1 through stage 4 chronic kidney disease, or unspecified chronic kidney disease: Secondary | ICD-10-CM | POA: Diagnosis not present

## 2016-05-15 DIAGNOSIS — I130 Hypertensive heart and chronic kidney disease with heart failure and stage 1 through stage 4 chronic kidney disease, or unspecified chronic kidney disease: Secondary | ICD-10-CM | POA: Diagnosis not present

## 2016-05-15 DIAGNOSIS — I872 Venous insufficiency (chronic) (peripheral): Secondary | ICD-10-CM | POA: Diagnosis not present

## 2016-05-15 DIAGNOSIS — F329 Major depressive disorder, single episode, unspecified: Secondary | ICD-10-CM | POA: Diagnosis not present

## 2016-05-15 DIAGNOSIS — N183 Chronic kidney disease, stage 3 (moderate): Secondary | ICD-10-CM | POA: Diagnosis not present

## 2016-05-15 DIAGNOSIS — I5032 Chronic diastolic (congestive) heart failure: Secondary | ICD-10-CM | POA: Diagnosis not present

## 2016-05-15 DIAGNOSIS — Z872 Personal history of diseases of the skin and subcutaneous tissue: Secondary | ICD-10-CM | POA: Diagnosis not present

## 2016-05-18 DIAGNOSIS — I130 Hypertensive heart and chronic kidney disease with heart failure and stage 1 through stage 4 chronic kidney disease, or unspecified chronic kidney disease: Secondary | ICD-10-CM | POA: Diagnosis not present

## 2016-05-18 DIAGNOSIS — Z872 Personal history of diseases of the skin and subcutaneous tissue: Secondary | ICD-10-CM | POA: Diagnosis not present

## 2016-05-18 DIAGNOSIS — F329 Major depressive disorder, single episode, unspecified: Secondary | ICD-10-CM | POA: Diagnosis not present

## 2016-05-18 DIAGNOSIS — I5032 Chronic diastolic (congestive) heart failure: Secondary | ICD-10-CM | POA: Diagnosis not present

## 2016-05-18 DIAGNOSIS — I872 Venous insufficiency (chronic) (peripheral): Secondary | ICD-10-CM | POA: Diagnosis not present

## 2016-05-18 DIAGNOSIS — N183 Chronic kidney disease, stage 3 (moderate): Secondary | ICD-10-CM | POA: Diagnosis not present

## 2016-05-25 DIAGNOSIS — I872 Venous insufficiency (chronic) (peripheral): Secondary | ICD-10-CM | POA: Diagnosis not present

## 2016-05-25 DIAGNOSIS — I5032 Chronic diastolic (congestive) heart failure: Secondary | ICD-10-CM | POA: Diagnosis not present

## 2016-05-25 DIAGNOSIS — N183 Chronic kidney disease, stage 3 (moderate): Secondary | ICD-10-CM | POA: Diagnosis not present

## 2016-05-25 DIAGNOSIS — I130 Hypertensive heart and chronic kidney disease with heart failure and stage 1 through stage 4 chronic kidney disease, or unspecified chronic kidney disease: Secondary | ICD-10-CM | POA: Diagnosis not present

## 2016-05-25 DIAGNOSIS — Z872 Personal history of diseases of the skin and subcutaneous tissue: Secondary | ICD-10-CM | POA: Diagnosis not present

## 2016-05-25 DIAGNOSIS — F329 Major depressive disorder, single episode, unspecified: Secondary | ICD-10-CM | POA: Diagnosis not present

## 2016-05-26 ENCOUNTER — Ambulatory Visit (INDEPENDENT_AMBULATORY_CARE_PROVIDER_SITE_OTHER): Payer: Medicare Other | Admitting: Neurology

## 2016-05-26 ENCOUNTER — Encounter: Payer: Self-pay | Admitting: Neurology

## 2016-05-26 VITALS — BP 130/70 | HR 72 | Ht 67.0 in | Wt 155.6 lb

## 2016-05-26 DIAGNOSIS — G609 Hereditary and idiopathic neuropathy, unspecified: Secondary | ICD-10-CM

## 2016-05-26 DIAGNOSIS — F0390 Unspecified dementia without behavioral disturbance: Secondary | ICD-10-CM | POA: Diagnosis not present

## 2016-05-26 DIAGNOSIS — M792 Neuralgia and neuritis, unspecified: Secondary | ICD-10-CM

## 2016-05-26 DIAGNOSIS — F03A0 Unspecified dementia, mild, without behavioral disturbance, psychotic disturbance, mood disturbance, and anxiety: Secondary | ICD-10-CM

## 2016-05-26 MED ORDER — RIVASTIGMINE TARTRATE 1.5 MG PO CAPS: 1.5000 mg | ORAL_CAPSULE | Freq: Two times a day (BID) | ORAL | 6 refills | Status: DC

## 2016-05-26 NOTE — Progress Notes (Addendum)
NEUROLOGY FOLLOW UP OFFICE NOTE  Lance Kemp UA:[id number]  HISTORY OF PRESENT ILLNESS: I had the pleasure of seeing Lance Kemp in follow-up in the neurology clinic on 05/31/2016. He was last seen a year ago for mild dementia and is again accompanied by his son who helps supplement the history. MMSE in December 2016 was 28/30 (23/30 in March 2016). He continues to do well over the past year, he and his some report memory has been stable. His son bought the house across the street, the patient will be moving out of his ALF and live next door, where he will have in home care 2-3 times a week. His son will provide meals and administer his medications. Since his last visit, he has been having terrible headaches since a scalp biopsy was done mid-October. Pain is localized to where the biopsy was done, then would radiate outward. No associated nausea/vomiting, vision changes. It is tender to touch.  HPI: This is a pleasant 82 yo RH man with a history of hypertension, hyperlipidemia, chronic shoulder pain, with memory loss that became noticeable when he moved to [place] in 2014. He feels his memory is "not too good." He cites instances where he is introduced to someone, then he walks 15 feet away and forgets their name. He misplaces things frequently. He has had some word-finding difficulties. He had been living in [place] by himself, with his son visiting him annually except in 2013. On his visit in 2012, his father was doing absolutely fine. He had a TIA in 2013 when he fell, and when his son came next in August 2014, he noticed a huge difference. His doctor told him not to drive after the TIA in 2013. He denied getting lost driving, and denied any missed bills or medications. He moved to [place] to be closer to his son in September 2014. His son has noticed several things, his short term memory is not good, he cannot recall what he ate prior, or their discussion the day prior, appointments, the day of  the week. He seems confused on place and time, and would refer to going down the hallway at his facility as "going downstairs" or the lobby as the "living room" or "family room." His long-term memory is wonderful. His speech is fine, but he has a hard time finding the correct words to describe a location. He does not focus on a discussion, and can drift off easily when distracted. He needs help with bathing but can dress himself without difficulty. He was started on Aricept which caused significant diarrhea that resolved after this was discontinued.  Diagnostic Data: I personally reviewed MRI brain without contrast done 08/2014 which showed prominence of the ventricular system, slightly out of proportion to the degree of diffuse atrophy; moderate to severe chronic microvascular disease, no acute changes. He does not have any symptoms of normal pressure hydrocephalus, he denies any urinary incontinence, no magnetic gait.  PAST MEDICAL HISTORY: Past Medical History:  Diagnosis Date  . Basal cell carcinoma 2011; 2016   Face  . BPH with obstruction/lower urinary tract symptoms   . Chronic back pain   . Chronic diastolic heart failure ([place])   . Chronic left shoulder pain 09/14/2013  . Chronic renal insufficiency, stage III (moderate) 2013   CrCl in the 50s-60s  . Chronic venous insufficiency    compression hose  . 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  No significant residual deficit except short term memory impairment  . Dementia  2016   Dr. Delice Lesch 08/2014--started aricept but pt did not tolerate  . Depression    antidepressant x 1 yr--situational. Resolved 08/2013 per pt/son.  . Frequent headaches   . Glaucoma   . Hypertension   . Normocytic anemia 2015   secondary to CRI (iron studies ok 12/2013)  . NSTEMI (non-ST elevated myocardial infarction) ([place]) 06/2011   [place]: Troponin mildly elevated, normal EKG, normal ECHO, pt declined cardiology consult--per old PCP records.  Marland Kitchen OAB  (overactive bladder)   . Osteoarthritis    hands/wrists  . Peripheral neuropathy ([place]) 2013   Dx'd by neuro in [place] at the time the patient was hospitalized briefly for pontine CVA.  Marland Kitchen Shortness of breath   . Urine incontinence     MEDICATIONS: Current Outpatient Prescriptions on File Prior to Visit  Medication Sig Dispense Refill  . aspirin 81 MG chewable tablet Chew 81 mg by mouth daily.    Marland Kitchen atorvastatin (LIPITOR) 20 MG tablet Take 20 mg by mouth at bedtime.     Marland Kitchen azelastine (OPTIVAR) 0.05 % ophthalmic solution Place 1 drop into both eyes 2 (two) times daily.     . finasteride (PROSCAR) 5 MG tablet Take 1 tablet (5 mg total) by mouth daily. 30 tablet 6  . furosemide (LASIX) 40 MG tablet Take 40 mg by mouth daily.    . hydrALAZINE (APRESOLINE) 10 MG tablet Take 10 mg by mouth 3 (three) times daily.     Marland Kitchen levobunolol (BETAGAN) 0.5 % ophthalmic solution Place 1 drop into both eyes 2 (two) times daily.    . meclizine (ANTIVERT) 50 MG tablet Take 1 tablet (50 mg total) by mouth 3 (three) times daily as needed for dizziness. 8 tablet 0  . mirabegron ER (MYRBETRIQ) 25 MG TB24 tablet Take 1 tablet (25 mg total) by mouth daily. 30 tablet 12  . polyethylene glycol (MIRALAX / GLYCOLAX) packet Take 17 g by mouth daily.    . tamsulosin (FLOMAX) 0.4 MG CAPS capsule Take 0.8 mg by mouth at bedtime.     No current facility-administered medications on file prior to visit.     ALLERGIES: Allergies  Allergen Reactions  . Altace [Ramipril] Other (See Comments)    Reaction:  Unknown   . Amoxicillin Other (See Comments)    Reaction:  Unknown  Has patient had a PCN reaction causing immediate rash, facial/tongue/throat swelling, SOB or lightheadedness with hypotension: Unsure Has patient had a PCN reaction causing severe rash involving mucus membranes or skin necrosis: Unsure Has patient had a PCN reaction that required hospitalization Unsure Has patient had a PCN reaction occurring within the  last 10 years: Unsure If all of the above answers are "NO", then may proceed with Cephalosporin use.  [name] Potassium] Other (See Comments)    Reaction:  Unknown     FAMILY HISTORY: Family History  Problem Relation Age of Onset  . Cancer Mother   . Heart disease Mother     SOCIAL HISTORY: Social History   Social History  . Marital status: Single    Spouse name: N/A  . Number of children: 1  . Years of education: N/A   Occupational History  . Retired    Social History Main Topics  . Smoking status: Former Smoker    Types: Pipe  . Smokeless tobacco: Never Used  . Alcohol use No  . Drug use: No  . Sexual activity: No   Other Topics Concern  . Not on file  Social History Narrative   Widower.   Relocated to [place] from [place] 02/2013 to live in [place] (falls while living alone led to this).   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   Pipe smoker until 2014.   Alcohol: social drinker until his 59s, then no alcohol.          REVIEW OF SYSTEMS: Constitutional: No fevers, chills, or sweats, no generalized fatigue, change in appetite Eyes: No visual changes, double vision, eye pain Ear, nose and throat: No hearing loss, ear pain, nasal congestion, sore throat Cardiovascular: No chest pain, palpitations Respiratory:  No shortness of breath at rest or with exertion, wheezes GastrointestinaI: No nausea, vomiting, diarrhea, abdominal pain, fecal incontinence Genitourinary:  No dysuria, urinary retention or frequency Musculoskeletal:  No neck pain, back pain. + shoulder pain Integumentary: No rash, pruritus, skin lesions Neurological: as above Psychiatric: No depression, insomnia, anxiety Endocrine: No palpitations, fatigue, diaphoresis, mood swings, change in appetite, change in weight, increased thirst Hematologic/Lymphatic:  No anemia, purpura, petechiae. Allergic/Immunologic: no itchy/runny eyes, nasal congestion, recent allergic  reactions, rashes  PHYSICAL EXAM: Vitals:   05/26/16 1532  BP: 130/70  Pulse: 72   General: No acute distress Head:  Normocephalic/atraumatic, some tenderness to palpation on right parietal region Neck: supple, no paraspinal tenderness, full range of motion Heart:  Regular rate and rhythm Lungs:  Clear to auscultation bilaterally Back: No paraspinal tenderness Skin/Extremities: No rash, no edema Neurological Exam: alert and oriented to person, place, and time. No aphasia or dysarthria. Fund of knowledge is appropriate.  Recent and remote memory are intact. 2/3 delayed recall.  Attention and concentration are normal.    Able to name objects and repeat phrases. CDT 4/5 MMSE - Mini Mental State Exam 05/26/2016 05/26/2015 08/24/2014  Orientation to time 1 5 2   Orientation to Place 4 5 4   Registration 3 3 3   Attention/ Calculation 4 4 5   Recall 0 2 0  Language- name 2 objects 2 2 2   Language- repeat 1 1 1   Language- follow 3 step command 3 3 3   Language- read & follow direction 1 1 1   Write a sentence 1 1 1   Copy design 1 1 1   Total score 21 28 23    Cranial nerves: Pupils equal, round, reactive to light.  Extraocular movements intact with no nystagmus. Visual fields full. Facial sensation intact. No facial asymmetry. Tongue, uvula, palate midline.  Motor: Bulk and tone normal, muscle strength 5/5 throughout with no pronator drift.  Sensation to light touch intact.  No extinction to double simultaneous stimulation.  Deep tendon reflexes 1+ throughout except for absent ankle jerks bilaterally, toes downgoing.  Finger to nose testing intact.  Gait slow and cautious with walker, no magnetic gait noted, no ataxia. Romberg negative.  IMPRESSION: This is a pleasant 82 yo RH man with a history of hypertension, hyperlipidemia, chronic shoulder pain, who presented with worsening memory and slight behavioral changes since 2014. MMSE today 21/30 (previously 28/30 in December 2016, 23/30 in March 2016),  indicating mild dementia. He had side effects on Aricept and is agreeable to starting Exelon 1.5mg  BID. Side effects were discussed. If he tolerates this fine, his son will call us to update and dose will be increased to 3mg  BID. He will be moving next door to his son with in-home care, continue close supervision. He does not drive. Continue control of vascular risk factors, physical exercise and brain stimulation exercises for brain health. Headaches appear to be localized  where biopsy was done, he may take Tylenol prn and try lidocaine ointment. I am hesitant to start medications such as gabapentin due to side effects, his son agrees. He will follow-up in 6 months or earlier if needed.   Thank you for allowing me to participate in his care.  Please do not hesitate to call for any questions or concerns.  The duration of this appointment visit was 25 minutes of face-to-face time with the patient.  Greater than 50% of this time was spent in counseling, explanation of diagnosis, planning of further management, and coordination of care.   Ellouise Newer, M.D.   CC: Dr. Anitra Lauth

## 2016-05-26 NOTE — Patient Instructions (Addendum)
1. Start Exelon 1.5mg  twice a day. After 3 months, call our office and we will plan to increase it to 3mg  twice a day 2. Continue Tylenol as needed for head pain. Try lidocaine ointment on scalp 3. Follow-up in 6 months, call for any changes

## 2016-06-01 ENCOUNTER — Encounter: Payer: Self-pay | Admitting: Family Medicine

## 2016-06-02 ENCOUNTER — Other Ambulatory Visit: Payer: Self-pay | Admitting: Family Medicine

## 2016-06-02 DIAGNOSIS — F0390 Unspecified dementia without behavioral disturbance: Secondary | ICD-10-CM

## 2016-06-02 DIAGNOSIS — F03A0 Unspecified dementia, mild, without behavioral disturbance, psychotic disturbance, mood disturbance, and anxiety: Secondary | ICD-10-CM

## 2016-06-02 MED ORDER — TAMSULOSIN HCL 0.4 MG PO CAPS: 0.8000 mg | ORAL_CAPSULE | Freq: Every day | ORAL | 6 refills | Status: DC

## 2016-06-02 MED ORDER — RIVASTIGMINE TARTRATE 1.5 MG PO CAPS: 1.5000 mg | ORAL_CAPSULE | Freq: Two times a day (BID) | ORAL | 6 refills | Status: DC

## 2016-06-02 MED ORDER — MIRABEGRON ER 25 MG PO TB24: 25.0000 mg | ORAL_TABLET | Freq: Every day | ORAL | 6 refills | Status: DC

## 2016-06-02 MED ORDER — MECLIZINE HCL 32 MG PO TABS: 32.0000 mg | ORAL_TABLET | Freq: Three times a day (TID) | ORAL | 6 refills | Status: DC | PRN

## 2016-06-02 MED ORDER — ATORVASTATIN CALCIUM 20 MG PO TABS: 20.0000 mg | ORAL_TABLET | Freq: Every day | ORAL | 6 refills | Status: DC

## 2016-06-02 MED ORDER — FINASTERIDE 5 MG PO TABS: 5.0000 mg | ORAL_TABLET | Freq: Every day | ORAL | 6 refills | Status: DC

## 2016-06-02 MED ORDER — LEVOBUNOLOL HCL 0.5 % OP SOLN: 1.0000 [drp] | Freq: Two times a day (BID) | OPHTHALMIC | 6 refills | Status: DC

## 2016-06-02 MED ORDER — FUROSEMIDE 40 MG PO TABS: 40.0000 mg | ORAL_TABLET | Freq: Every day | ORAL | 6 refills | Status: DC

## 2016-06-02 MED ORDER — HYDRALAZINE HCL 10 MG PO TABS: 10.0000 mg | ORAL_TABLET | Freq: Three times a day (TID) | ORAL | 6 refills | Status: DC

## 2016-06-02 MED ORDER — AZELASTINE HCL 0.05 % OP SOLN: 1.0000 [drp] | Freq: Two times a day (BID) | OPHTHALMIC | 6 refills | Status: DC

## 2016-06-02 MED ORDER — POLYETHYLENE GLYCOL 3350 17 GM/SCOOP PO POWD: 17.0000 g | Freq: Two times a day (BID) | ORAL | 6 refills | Status: DC | PRN

## 2016-06-02 NOTE — Telephone Encounter (Signed)
Please advise.  I'm not sure if they need 30 or 90 day. I left message for pts son Gerald Stabs to call back (I would recommend sending 30 day w/ no refills until we hear back).

## 2016-06-02 NOTE — Telephone Encounter (Signed)
Correction: pt will need Rx's by Sunday, 06/04/16.

## 2016-06-02 NOTE — Telephone Encounter (Signed)
Patient's son came into the office and states that he needs all pt's Rx's sent to [facility] because, as of 06/06/16, pt will no longer living at [facility] facility.   Can you please RX all medications to [facility]?

## 2016-06-05 NOTE — Telephone Encounter (Signed)
Left message on pts son cell advising that Rx's have been sent.

## 2016-06-15 ENCOUNTER — Telehealth: Payer: Self-pay | Admitting: Family Medicine

## 2016-06-15 ENCOUNTER — Ambulatory Visit: Payer: Medicare Other

## 2016-06-15 NOTE — Telephone Encounter (Signed)
Patient's son states patient is refusing to get his flu shot. Appt canceled

## 2016-06-15 NOTE — Telephone Encounter (Signed)
Noted. Chart updated.

## 2016-06-21 ENCOUNTER — Telehealth: Payer: Self-pay | Admitting: Family Medicine

## 2016-06-21 ENCOUNTER — Telehealth: Payer: Self-pay | Admitting: Neurology

## 2016-06-21 ENCOUNTER — Other Ambulatory Visit: Payer: Self-pay

## 2016-06-21 DIAGNOSIS — F03A0 Unspecified dementia, mild, without behavioral disturbance, psychotic disturbance, mood disturbance, and anxiety: Secondary | ICD-10-CM

## 2016-06-21 DIAGNOSIS — F0390 Unspecified dementia without behavioral disturbance: Secondary | ICD-10-CM

## 2016-06-21 MED ORDER — TAMSULOSIN HCL 0.4 MG PO CAPS: 0.8000 mg | ORAL_CAPSULE | Freq: Every day | ORAL | 6 refills | Status: DC

## 2016-06-21 MED ORDER — AZELASTINE HCL 0.05 % OP SOLN: 1.0000 [drp] | Freq: Two times a day (BID) | OPHTHALMIC | 6 refills | Status: DC

## 2016-06-21 MED ORDER — HYDRALAZINE HCL 10 MG PO TABS: 10.0000 mg | ORAL_TABLET | Freq: Three times a day (TID) | ORAL | 6 refills | Status: DC

## 2016-06-21 MED ORDER — FINASTERIDE 5 MG PO TABS: 5.0000 mg | ORAL_TABLET | Freq: Every day | ORAL | 6 refills | Status: DC

## 2016-06-21 MED ORDER — POLYETHYLENE GLYCOL 3350 17 GM/SCOOP PO POWD: 17.0000 g | Freq: Two times a day (BID) | ORAL | 6 refills | Status: DC | PRN

## 2016-06-21 MED ORDER — MECLIZINE HCL 32 MG PO TABS: 32.0000 mg | ORAL_TABLET | Freq: Three times a day (TID) | ORAL | 6 refills | Status: DC | PRN

## 2016-06-21 MED ORDER — RIVASTIGMINE TARTRATE 1.5 MG PO CAPS: 1.5000 mg | ORAL_CAPSULE | Freq: Two times a day (BID) | ORAL | 6 refills | Status: DC

## 2016-06-21 MED ORDER — FUROSEMIDE 40 MG PO TABS: 40.0000 mg | ORAL_TABLET | Freq: Every day | ORAL | 6 refills | Status: DC

## 2016-06-21 MED ORDER — MIRABEGRON ER 25 MG PO TB24: 25.0000 mg | ORAL_TABLET | Freq: Every day | ORAL | 6 refills | Status: DC

## 2016-06-21 MED ORDER — ATORVASTATIN CALCIUM 20 MG PO TABS: 20.0000 mg | ORAL_TABLET | Freq: Every day | ORAL | 6 refills | Status: DC

## 2016-06-21 MED ORDER — LEVOBUNOLOL HCL 0.5 % OP SOLN: 1.0000 [drp] | Freq: Two times a day (BID) | OPHTHALMIC | 6 refills | Status: AC

## 2016-06-21 NOTE — Telephone Encounter (Signed)
Notified patients son RX sent.

## 2016-06-21 NOTE — Telephone Encounter (Signed)
This has already been done back in December 2017 when pts son came in requesting this. Left detailed message on sons cell vm.

## 2016-06-21 NOTE — Telephone Encounter (Signed)
Lance Kemp 2022/11/27. He wanted to update the pharmacy to [facility] # [phone]. The medication that was prescribed by Dr. Delice Lesch needs to be sent to that location. His son Jerold Coombe  # [phone]. Thank you

## 2016-06-21 NOTE — Addendum Note (Signed)
Addended by: Onalee Hua on: 06/21/2016 04:26 PM   Modules accepted: Orders

## 2016-06-21 NOTE — Telephone Encounter (Signed)
Patient's son Gerald Stabs calling to report patient is no longer at assisted living facility.  He will need all his meds refilled as he only has 1 week's supply remaining.  Patient no longer uses [facility], he now only uses [facility].  Please send all refills to new pharmacy.

## 2016-06-21 NOTE — Telephone Encounter (Signed)
Pts son called back and stated that he just left [facility] and that they did not have Rx's. I reviewed chart and seen that Rx's were sent to [facility] instead of [facility]. Rx's resent to correct pharmacy.

## 2016-06-27 ENCOUNTER — Ambulatory Visit (INDEPENDENT_AMBULATORY_CARE_PROVIDER_SITE_OTHER): Payer: Medicare Other | Admitting: Sports Medicine

## 2016-06-27 ENCOUNTER — Encounter: Payer: Self-pay | Admitting: Sports Medicine

## 2016-06-27 DIAGNOSIS — I739 Peripheral vascular disease, unspecified: Secondary | ICD-10-CM | POA: Diagnosis not present

## 2016-06-27 DIAGNOSIS — M79672 Pain in left foot: Secondary | ICD-10-CM

## 2016-06-27 DIAGNOSIS — M79671 Pain in right foot: Secondary | ICD-10-CM | POA: Diagnosis not present

## 2016-06-27 DIAGNOSIS — B351 Tinea unguium: Secondary | ICD-10-CM | POA: Diagnosis not present

## 2016-06-27 NOTE — Progress Notes (Signed)
Patient ID: Maddex Alltop, male   DOB: [birth year], 83 y.o.   MRN: [id number]   Subjective: Miccah Cockerell is a 83 y.o. male patient seen today in office with complaint of painful thickened and elongated toenails; unable to trim. Patient denies any changes with medical history since last visit. Patient is assisted by son at this visit who states that dad has moved in a house across the street and is no longer at nursing facility. Patient has no other pedal complaints at this time.   Patient Active Problem List   Diagnosis Date Noted  . Cellulitis of left lower extremity 04/25/2016  . Cellulitis 04/25/2016  . Basal cell carcinoma, face 04/07/2015  . Mild dementia 11/24/2014  . OAB (overactive bladder) 10/17/2014  . Essential hypertension 08/25/2014  . Hyperlipidemia 08/25/2014  . Peripheral edema 04/16/2014  . Chronic diastolic heart failure ([place]) 03/04/2014  . Chronic renal insufficiency, stage III (moderate)   . Chronic shoulder pain 02/19/2014  . SOB (shortness of breath) 02/18/2014  . Acute on chronic diastolic heart failure ([place]) 12/24/2013  . Lumbago 12/18/2013  . History of CVA (cerebrovascular accident) 11/21/2013  . BPH associated with nocturia 11/21/2013  . Short-term memory loss 10/24/2013  . Chronic left shoulder pain 09/14/2013  . Venous insufficiency of both lower extremities 09/14/2013  . HTN (hypertension), benign 09/14/2013    Current Outpatient Prescriptions on File Prior to Visit  Medication Sig Dispense Refill  . aspirin 81 MG chewable tablet Chew 81 mg by mouth daily.    Marland Kitchen atorvastatin (LIPITOR) 20 MG tablet Take 1 tablet (20 mg total) by mouth at bedtime. 30 tablet 6  . azelastine (OPTIVAR) 0.05 % ophthalmic solution Place 1 drop into both eyes 2 (two) times daily. 6 mL 6  . finasteride (PROSCAR) 5 MG tablet Take 1 tablet (5 mg total) by mouth daily. 30 tablet 6  . furosemide (LASIX) 40 MG tablet Take 1 tablet (40 mg total) by mouth daily. 30 tablet 6  .  hydrALAZINE (APRESOLINE) 10 MG tablet Take 1 tablet (10 mg total) by mouth 3 (three) times daily. 90 tablet 6  . levobunolol (BETAGAN) 0.5 % ophthalmic solution Place 1 drop into both eyes 2 (two) times daily. 5 mL 6  . meclizine (ANTIVERT) 32 MG tablet Take 1 tablet (32 mg total) by mouth 3 (three) times daily as needed. 30 tablet 6  . mirabegron ER (MYRBETRIQ) 25 MG TB24 tablet Take 1 tablet (25 mg total) by mouth daily. 30 tablet 6  . polyethylene glycol powder (GLYCOLAX/MIRALAX) powder Take 17 g by mouth 2 (two) times daily as needed. 3350 g 6  . rivastigmine (EXELON) 1.5 MG capsule Take 1 capsule (1.5 mg total) by mouth 2 (two) times daily. 60 capsule 6  . tamsulosin (FLOMAX) 0.4 MG CAPS capsule Take 2 capsules (0.8 mg total) by mouth at bedtime. 30 capsule 6   No current facility-administered medications on file prior to visit.     Allergies  Allergen Reactions  . Altace [Ramipril] Other (See Comments)    Reaction:  Unknown   . Amoxicillin Other (See Comments)    Reaction:  Unknown  Has patient had a PCN reaction causing immediate rash, facial/tongue/throat swelling, SOB or lightheadedness with hypotension: Unsure Has patient had a PCN reaction causing severe rash involving mucus membranes or skin necrosis: Unsure Has patient had a PCN reaction that required hospitalization Unsure Has patient had a PCN reaction occurring within the last 10 years: Unsure If all of the above answers  are "NO", then may proceed with Cephalosporin use.  Blanchard Kelch [Losartan Potassium] Other (See Comments)    Reaction:  Unknown     Objective: Physical Exam  General: Well developed, nourished, no acute distress, awake, alert and oriented x 3  Vascular: Dorsalis pedis artery 1/4 bilateral, Posterior tibial artery 0/4 bilateral, +1 pitting edema bilateral, skin temperature warm to slightly cool proximal to distal bilateral lower extremities, + varicosities, no pedal hair present bilateral.  Neurological:  Gross sensation present via light touch bilateral.   Dermatological: Skin is warm, dry, and supple bilateral, Nails 1-10 are tender, long, thick, and discolored with mild subungal debris, no webspace macerations present bilateral, no open lesions present bilateral, no callus/corns/hyperkeratotic tissue present bilateral. No signs of infection bilateral.  Musculoskeletal: Mild asymptomatic hammertoe boney deformities noted bilateral. Muscular strength within normal limits without pain on range of motion. No pain with calf compression bilateral.  Assessment and Plan:  Problem List Items Addressed This Visit    None    Visit Diagnoses    Onychomycosis    -  Primary   Foot pain, bilateral       PVD (peripheral vascular disease) ([place])         -Examined patient.  -Discussed treatment options for painful mycotic nails. -Mechanically debrided and reduced mycotic nails with sterile nail nipper and dremel nail file without incident. -Recommend cont with compression stockings and lower leg elevation to assist with edema control -Recommend continue with good supportive shoes for foot type -Patient to return in 3 months for follow up nail care/ evaluation or sooner if symptoms worsen.  Landis Martins, DPM

## 2016-06-30 ENCOUNTER — Encounter: Payer: Self-pay | Admitting: Family Medicine

## 2016-06-30 ENCOUNTER — Ambulatory Visit (INDEPENDENT_AMBULATORY_CARE_PROVIDER_SITE_OTHER): Payer: Medicare Other | Admitting: Family Medicine

## 2016-06-30 VITALS — BP 118/65 | HR 65 | Temp 97.9°F | Resp 16 | Ht 67.0 in | Wt 149.5 lb

## 2016-06-30 DIAGNOSIS — R233 Spontaneous ecchymoses: Secondary | ICD-10-CM

## 2016-06-30 DIAGNOSIS — Z23 Encounter for immunization: Secondary | ICD-10-CM

## 2016-06-30 LAB — CBC WITH DIFFERENTIAL/PLATELET
BASOS ABS: 69 {cells}/uL (ref 0–200)
BASOS PCT: 1 %
EOS ABS: 552 {cells}/uL — AB (ref 15–500)
Eosinophils Relative: 8 %
HCT: 35 % — ABNORMAL LOW (ref 38.5–50.0)
HEMOGLOBIN: 11.4 g/dL — AB (ref 13.2–17.1)
LYMPHS ABS: 2208 {cells}/uL (ref 850–3900)
Lymphocytes Relative: 32 %
MCH: 30.2 pg (ref 27.0–33.0)
MCHC: 32.6 g/dL (ref 32.0–36.0)
MCV: 92.8 fL (ref 80.0–100.0)
MONO ABS: 828 {cells}/uL (ref 200–950)
MONOS PCT: 12 %
MPV: 11.6 fL (ref 7.5–12.5)
NEUTROS ABS: 3243 {cells}/uL (ref 1500–7800)
Neutrophils Relative %: 47 %
PLATELETS: 194 10*3/uL (ref 140–400)
RBC: 3.77 MIL/uL — ABNORMAL LOW (ref 4.20–5.80)
RDW: 13.5 % (ref 11.0–15.0)
WBC: 6.9 10*3/uL (ref 3.8–10.8)

## 2016-06-30 LAB — PROTIME-INR
INR: 1.1
Prothrombin Time: 11.2 s (ref 9.0–11.5)

## 2016-06-30 LAB — APTT: APTT: 25 s (ref 22–34)

## 2016-06-30 NOTE — Progress Notes (Signed)
OFFICE VISIT  06/30/2016   CC:  Chief Complaint  Patient presents with  . Bleeding/Bruising    Bruising on upper right arm, no known injury   HPI:    Patient is a 83 y.o. Caucasian male who presents for itchy, bruising-type rash in the right upper arm the last 10d or so.  It has partially resolved over the last 2-3 d per son. No trauma noted.  Exelon was most recent med started (by neuro, 05/23/16).  He takes one ASA 81mg  qd. No other unexplained bruising.  No hematuria or blood in stool.  No nosebleeds. It itches where the bruising is but there is no pain. He does say he feels unusually tired today.  Past Medical History:  Diagnosis Date  . Basal cell carcinoma 2011; 2016   Face  . BPH with obstruction/lower urinary tract symptoms   . Chronic back pain   . Chronic diastolic heart failure ([place])   . Chronic left shoulder pain 09/14/2013  . Chronic renal insufficiency, stage III (moderate) 2013   CrCl in the 50s-60s  . Chronic venous insufficiency    compression hose  . 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  No significant residual deficit except short term memory impairment  . Dementia 2016   Dr. Delice Lesch 08/2014--started aricept but pt did not tolerate.  Exelon trial started by Dr. Delice Lesch 05/2016.  Marland Kitchen Depression    antidepressant x 1 yr--situational. Resolved 08/2013 per pt/son.  . Frequent headaches   . Glaucoma   . Hypertension   . Normocytic anemia 2015   secondary to CRI (iron studies ok 12/2013)  . NSTEMI (non-ST elevated myocardial infarction) ([place]) 06/2011   [place]: Troponin mildly elevated, normal EKG, normal ECHO, pt declined cardiology consult--per old PCP records.  Marland Kitchen OAB (overactive bladder)   . Osteoarthritis    hands/wrists  . Peripheral neuropathy ([place]) 2013   Dx'd by neuro in [place] at the time the patient was hospitalized briefly for pontine CVA.  Marland Kitchen Shortness of breath   . Urine incontinence     Past Surgical History:  Procedure Laterality Date   . CHOLECYSTECTOMY    . NM VQ LUNG SCAN ([place] [facility])  09/2014   Low risk  . TONSILECTOMY, ADENOIDECTOMY, BILATERAL MYRINGOTOMY AND TUBES    . TRANSTHORACIC ECHOCARDIOGRAM  12/18/13   Grade 1 diast dysf, o/w normal    Outpatient Medications Prior to Visit  Medication Sig Dispense Refill  . aspirin 81 MG chewable tablet Chew 81 mg by mouth daily.    Marland Kitchen atorvastatin (LIPITOR) 20 MG tablet Take 1 tablet (20 mg total) by mouth at bedtime. 30 tablet 6  . azelastine (OPTIVAR) 0.05 % ophthalmic solution Place 1 drop into both eyes 2 (two) times daily. 6 mL 6  . finasteride (PROSCAR) 5 MG tablet Take 1 tablet (5 mg total) by mouth daily. 30 tablet 6  . furosemide (LASIX) 40 MG tablet Take 1 tablet (40 mg total) by mouth daily. 30 tablet 6  . hydrALAZINE (APRESOLINE) 10 MG tablet Take 1 tablet (10 mg total) by mouth 3 (three) times daily. 90 tablet 6  . levobunolol (BETAGAN) 0.5 % ophthalmic solution Place 1 drop into both eyes 2 (two) times daily. 5 mL 6  . mirabegron ER (MYRBETRIQ) 25 MG TB24 tablet Take 1 tablet (25 mg total) by mouth daily. 30 tablet 6  . polyethylene glycol powder (GLYCOLAX/MIRALAX) powder Take 17 g by mouth 2 (two) times daily as needed. 3350 g 6  . rivastigmine (  EXELON) 1.5 MG capsule Take 1 capsule (1.5 mg total) by mouth 2 (two) times daily. 60 capsule 6  . tamsulosin (FLOMAX) 0.4 MG CAPS capsule Take 2 capsules (0.8 mg total) by mouth at bedtime. 30 capsule 6  . meclizine (ANTIVERT) 32 MG tablet Take 1 tablet (32 mg total) by mouth 3 (three) times daily as needed. (Patient not taking: Reported on 06/30/2016) 30 tablet 6   No facility-administered medications prior to visit.     Allergies  Allergen Reactions  . Altace [Ramipril] Other (See Comments)    Reaction:  Unknown   . Amoxicillin Other (See Comments)    Reaction:  Unknown  Has patient had a PCN reaction causing immediate rash, facial/tongue/throat swelling, SOB or lightheadedness with hypotension: Unsure Has patient  had a PCN reaction causing severe rash involving mucus membranes or skin necrosis: Unsure Has patient had a PCN reaction that required hospitalization Unsure Has patient had a PCN reaction occurring within the last 10 years: Unsure If all of the above answers are "NO", then may proceed with Cephalosporin use.  Blanchard Kelch [Losartan Potassium] Other (See Comments)    Reaction:  Unknown     ROS As per HPI  PE: Blood pressure 118/65, pulse 65, temperature 97.9 F (36.6 C), temperature source Oral, resp. rate 16, height 5\' 7"  (1.702 m), weight 149 lb 8 oz (67.8 kg), SpO2 95 %. Gen: Alert, well appearing.  Patient is oriented to person, place, time, and situation. AFFECT: pleasant, lucid thought and speech. CV: RRR, no m/r/g.   LUNGS: CTA bilat, nonlabored resps, good aeration in all lung fields. SKIN: R upper arm with splotchy ecchymoses, no erythema or warmth or tenderness.  No subQ nodules to suggest hematomas.  One small ecchymoses (nickel-sized) on distal aspect of L upper arm.  Otherwise skin is without ecchymoses.  LABS:  Lab Results  Component Value Date   WBC 8.9 04/26/2016   HGB 12.3 (L) 04/26/2016   HCT 36.8 (L) 04/26/2016   MCV 91.5 04/26/2016   PLT 216 04/26/2016     Chemistry      Component Value Date/Time   NA 141 04/27/2016 0510   K 3.6 04/27/2016 0510   CL 104 04/27/2016 0510   CO2 29 04/27/2016 0510   BUN 15 04/27/2016 0510   CREATININE 1.13 04/27/2016 0510   CREATININE 1.12 (H) 09/24/2015 1447      Component Value Date/Time   CALCIUM 8.7 (L) 04/27/2016 0510   ALKPHOS 80 05/27/2015 1444   AST 22 05/27/2015 1444   ALT 17 05/27/2015 1444   BILITOT 0.5 05/27/2015 1444     IMPRESSION AND PLAN:  Spontaneous ecchymoses L upper arm (vs ecchymoses secondary to incidental trauma). Reassured pt, doubt any systemic process/bleeding disorder going on here. Check CBC, PT/INR, PTT. Flu vaccine given today.  An After Visit Summary was printed and given to the  patient.  FOLLOW UP: Return keep appt already set for Feb 2018.  Signed:  Crissie Sickles, MD           06/30/2016

## 2016-06-30 NOTE — Progress Notes (Signed)
Pre visit review using our clinic review tool, if applicable. No additional management support is needed unless otherwise documented below in the visit note. 

## 2016-08-03 ENCOUNTER — Encounter: Payer: Self-pay | Admitting: Family Medicine

## 2016-08-03 ENCOUNTER — Ambulatory Visit (INDEPENDENT_AMBULATORY_CARE_PROVIDER_SITE_OTHER): Payer: Medicare Other | Admitting: Family Medicine

## 2016-08-03 VITALS — BP 114/74 | HR 60 | Temp 97.5°F | Resp 16 | Ht 67.0 in | Wt 147.5 lb

## 2016-08-03 DIAGNOSIS — N401 Enlarged prostate with lower urinary tract symptoms: Secondary | ICD-10-CM | POA: Diagnosis not present

## 2016-08-03 DIAGNOSIS — Z8673 Personal history of transient ischemic attack (TIA), and cerebral infarction without residual deficits: Secondary | ICD-10-CM | POA: Diagnosis not present

## 2016-08-03 DIAGNOSIS — I10 Essential (primary) hypertension: Secondary | ICD-10-CM | POA: Diagnosis not present

## 2016-08-03 DIAGNOSIS — L299 Pruritus, unspecified: Secondary | ICD-10-CM | POA: Diagnosis not present

## 2016-08-03 DIAGNOSIS — R5381 Other malaise: Secondary | ICD-10-CM | POA: Diagnosis not present

## 2016-08-03 DIAGNOSIS — M47812 Spondylosis without myelopathy or radiculopathy, cervical region: Secondary | ICD-10-CM

## 2016-08-03 DIAGNOSIS — M4692 Unspecified inflammatory spondylopathy, cervical region: Secondary | ICD-10-CM

## 2016-08-03 DIAGNOSIS — G4486 Cervicogenic headache: Secondary | ICD-10-CM

## 2016-08-03 DIAGNOSIS — R51 Headache: Secondary | ICD-10-CM

## 2016-08-03 DIAGNOSIS — I872 Venous insufficiency (chronic) (peripheral): Secondary | ICD-10-CM

## 2016-08-03 MED ORDER — FEXOFENADINE HCL 60 MG PO TABS: ORAL_TABLET | ORAL | 3 refills | Status: DC

## 2016-08-03 NOTE — Progress Notes (Signed)
Pre visit review using our clinic review tool, if applicable. No additional management support is needed unless otherwise documented below in the visit note. 

## 2016-08-03 NOTE — Progress Notes (Signed)
OFFICE VISIT  08/03/2016   CC:  Chief Complaint  Patient presents with  . Follow-up    [facility],    HPI:    Patient is a 83 y.o. Caucasian male who presents for 3 mo f/u bilat LE venous insufficiency edema, urge incont+BPH with LUTS, CRI stage III, and HTN. Settling into new home nicely.  Son lives across the street. Gets home health: assistance with showering.   Eating well.    BPH: Empties bladder well.  Has nocturia x 1, sometimes 2. No dizziness felt at any time.  HTN: Not monitoring bp at home.  He takes hydralazine as rx'd.  LL swelling is minimal lately b/c he's been wearing his compression stockings every day, taking lasix 40mg  qd as rx'd as well.  Rash on back and legs, onset about 10d/a, very itchy.  However, pt shows me his skin today and the rash is gone.  No hives described.  Still c/o pretty much daily HA, starts at occiput region and spreads forward to eventually involve entire head. No dizziness, no nausea, no photo/phono phobia.  No vision c/o.    Past Medical History:  Diagnosis Date  . Basal cell carcinoma 2011; 2016   Face  . BPH with obstruction/lower urinary tract symptoms   . Chronic back pain   . Chronic diastolic heart failure ([place])   . Chronic left shoulder pain 09/14/2013  . Chronic renal insufficiency, stage III (moderate) 2013   CrCl in the 50s-60s  . Chronic venous insufficiency    compression hose  . 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  No significant residual deficit except short term memory impairment  . Dementia 2016   Dr. Delice Lesch 08/2014--started aricept but pt did not tolerate.  Exelon trial started by Dr. Delice Lesch 05/2016.  Marland Kitchen Depression    antidepressant x 1 yr--situational. Resolved 08/2013 per pt/son.  . Frequent headaches   . Glaucoma   . Hypertension   . Normocytic anemia 2015   secondary to CRI (iron studies ok 12/2013)  . NSTEMI (non-ST elevated myocardial infarction) ([place]) 06/2011   [place]: Troponin mildly elevated,  normal EKG, normal ECHO, pt declined cardiology consult--per old PCP records.  Marland Kitchen OAB (overactive bladder)   . Osteoarthritis    hands/wrists  . Peripheral neuropathy ([place]) 2013   Dx'd by neuro in [place] at the time the patient was hospitalized briefly for pontine CVA.  Marland Kitchen Shortness of breath   . Urine incontinence     Past Surgical History:  Procedure Laterality Date  . CHOLECYSTECTOMY    . NM VQ LUNG SCAN ([place] [facility])  09/2014   Low risk  . TONSILECTOMY, ADENOIDECTOMY, BILATERAL MYRINGOTOMY AND TUBES    . TRANSTHORACIC ECHOCARDIOGRAM  12/18/13   Grade 1 diast dysf, o/w normal    Outpatient Medications Prior to Visit  Medication Sig Dispense Refill  . aspirin 81 MG chewable tablet Chew 81 mg by mouth daily.    Marland Kitchen atorvastatin (LIPITOR) 20 MG tablet Take 1 tablet (20 mg total) by mouth at bedtime. 30 tablet 6  . azelastine (OPTIVAR) 0.05 % ophthalmic solution Place 1 drop into both eyes 2 (two) times daily. 6 mL 6  . finasteride (PROSCAR) 5 MG tablet Take 1 tablet (5 mg total) by mouth daily. 30 tablet 6  . furosemide (LASIX) 40 MG tablet Take 1 tablet (40 mg total) by mouth daily. 30 tablet 6  . hydrALAZINE (APRESOLINE) 10 MG tablet Take 1 tablet (10 mg total) by mouth 3 (three) times  daily. 90 tablet 6  . levobunolol (BETAGAN) 0.5 % ophthalmic solution Place 1 drop into both eyes 2 (two) times daily. 5 mL 6  . mirabegron ER (MYRBETRIQ) 25 MG TB24 tablet Take 1 tablet (25 mg total) by mouth daily. 30 tablet 6  . polyethylene glycol powder (GLYCOLAX/MIRALAX) powder Take 17 g by mouth 2 (two) times daily as needed. 3350 g 6  . rivastigmine (EXELON) 1.5 MG capsule Take 1 capsule (1.5 mg total) by mouth 2 (two) times daily. 60 capsule 6  . tamsulosin (FLOMAX) 0.4 MG CAPS capsule Take 2 capsules (0.8 mg total) by mouth at bedtime. 30 capsule 6   No facility-administered medications prior to visit.     Allergies  Allergen Reactions  . Altace [Ramipril] Other (See Comments)    Reaction:   Unknown   . Amoxicillin Other (See Comments)    Reaction:  Unknown  Has patient had a PCN reaction causing immediate rash, facial/tongue/throat swelling, SOB or lightheadedness with hypotension: Unsure Has patient had a PCN reaction causing severe rash involving mucus membranes or skin necrosis: Unsure Has patient had a PCN reaction that required hospitalization Unsure Has patient had a PCN reaction occurring within the last 10 years: Unsure If all of the above answers are "NO", then may proceed with Cephalosporin use.  Blanchard Kelch [Losartan Potassium] Other (See Comments)    Reaction:  Unknown     ROS As per HPI  PE: Blood pressure 114/74, pulse 60, temperature 97.5 F (36.4 C), temperature source Oral, resp. rate 16, height 5\' 7"  (1.702 m), weight 147 lb 8 oz (66.9 kg), SpO2 97 %. Gen: Alert, well appearing.  Patient is oriented to person, place, time, and situation. AFFECT: pleasant, lucid thought and speech.  He walks with a rolling walker. CV: RRR  LUNGS: CTA bilat, nonlabored resps, good aeration in all lung fields. EXT: no clubbing or cyanosis.  Trace pitting edema in both LL's. SKIN: no rash Neck: ROM limited in extension and lateral bending.  No pain with ROM or palpation.   Neuro: CN 2-12 intact bilaterally, strength 5/5 in proximal and distal upper extremities and lower extremities bilaterally. No tremor.   LABS:    Chemistry      Component Value Date/Time   NA 141 04/27/2016 0510   K 3.6 04/27/2016 0510   CL 104 04/27/2016 0510   CO2 29 04/27/2016 0510   BUN 15 04/27/2016 0510   CREATININE 1.13 04/27/2016 0510   CREATININE 1.12 (H) 09/24/2015 1447      Component Value Date/Time   CALCIUM 8.7 (L) 04/27/2016 0510   ALKPHOS 80 05/27/2015 1444   AST 22 05/27/2015 1444   ALT 17 05/27/2015 1444   BILITOT 0.5 05/27/2015 1444     Lab Results  Component Value Date   WBC 6.9 06/30/2016   HGB 11.4 (L) 06/30/2016   HCT 35.0 (L) 06/30/2016   MCV 92.8 06/30/2016   PLT  194 06/30/2016    IMPRESSION AND PLAN:  1) chronic LE venous insufficiency edema: stable.  Continue compression stockings daily and 40mg  lasix qd. Check lytes/cr today.  2) HTN; The current medical regimen is effective;  continue present plan and medications.  3) CRI stage III: recheck lytes/cr today.  4) BPH with LUTS (? + some urge incontinence): doing well on current regimen of proscar and TWO alpha blockers--he is not having any side effects on this regimen so I'll continue it.  5) Headaches: suspect cervicogenic HA related to c-spine osteoarthritis. Will  try to arrange in home PT.  6) Pruritis; with pt report of rash at some point lately (it is gone presently); consider changing to hypoallergenic laundry detergent or bath soap/shampoo.  Allegra 60mg  bid rx'd to use prn itching.  7) Debilitated pt: I will order [facility] PT for therapeutic exercises to increase strength and endurance.  An After Visit Summary was printed and given to the patient.  FOLLOW UP: Return in about 3 months (around 10/31/2016) for routine chronic illness f/u.  Signed:  Crissie Sickles, MD           08/03/2016

## 2016-08-04 LAB — COMPREHENSIVE METABOLIC PANEL
ALT: 20 U/L (ref 0–53)
AST: 20 U/L (ref 0–37)
Albumin: 3.5 g/dL (ref 3.5–5.2)
Alkaline Phosphatase: 76 U/L (ref 39–117)
BUN: 21 mg/dL (ref 6–23)
CHLORIDE: 104 meq/L (ref 96–112)
CO2: 31 meq/L (ref 19–32)
CREATININE: 1.18 mg/dL (ref 0.40–1.50)
Calcium: 8.9 mg/dL (ref 8.4–10.5)
GFR: 61.43 mL/min (ref >60.00)
Glucose, Bld: 92 mg/dL (ref 70–99)
Potassium: 4.1 mEq/L (ref 3.5–5.1)
SODIUM: 141 meq/L (ref 135–145)
Total Bilirubin: 0.6 mg/dL (ref 0.2–1.2)
Total Protein: 6 g/dL (ref 6.0–8.3)

## 2016-08-10 DIAGNOSIS — M545 Low back pain: Secondary | ICD-10-CM | POA: Diagnosis not present

## 2016-08-10 DIAGNOSIS — I5032 Chronic diastolic (congestive) heart failure: Secondary | ICD-10-CM | POA: Diagnosis not present

## 2016-08-10 DIAGNOSIS — Z8673 Personal history of transient ischemic attack (TIA), and cerebral infarction without residual deficits: Secondary | ICD-10-CM | POA: Diagnosis not present

## 2016-08-10 DIAGNOSIS — Z7982 Long term (current) use of aspirin: Secondary | ICD-10-CM | POA: Diagnosis not present

## 2016-08-10 DIAGNOSIS — Z87891 Personal history of nicotine dependence: Secondary | ICD-10-CM | POA: Diagnosis not present

## 2016-08-10 DIAGNOSIS — G4489 Other headache syndrome: Secondary | ICD-10-CM | POA: Diagnosis not present

## 2016-08-10 DIAGNOSIS — M25512 Pain in left shoulder: Secondary | ICD-10-CM | POA: Diagnosis not present

## 2016-08-10 DIAGNOSIS — I130 Hypertensive heart and chronic kidney disease with heart failure and stage 1 through stage 4 chronic kidney disease, or unspecified chronic kidney disease: Secondary | ICD-10-CM | POA: Diagnosis not present

## 2016-08-10 DIAGNOSIS — I872 Venous insufficiency (chronic) (peripheral): Secondary | ICD-10-CM | POA: Diagnosis not present

## 2016-08-10 DIAGNOSIS — M47812 Spondylosis without myelopathy or radiculopathy, cervical region: Secondary | ICD-10-CM | POA: Diagnosis not present

## 2016-08-10 DIAGNOSIS — F0390 Unspecified dementia without behavioral disturbance: Secondary | ICD-10-CM | POA: Diagnosis not present

## 2016-08-10 DIAGNOSIS — N183 Chronic kidney disease, stage 3 (moderate): Secondary | ICD-10-CM | POA: Diagnosis not present

## 2016-08-15 DIAGNOSIS — I872 Venous insufficiency (chronic) (peripheral): Secondary | ICD-10-CM | POA: Diagnosis not present

## 2016-08-15 DIAGNOSIS — M47812 Spondylosis without myelopathy or radiculopathy, cervical region: Secondary | ICD-10-CM | POA: Diagnosis not present

## 2016-08-15 DIAGNOSIS — M25512 Pain in left shoulder: Secondary | ICD-10-CM | POA: Diagnosis not present

## 2016-08-15 DIAGNOSIS — M545 Low back pain: Secondary | ICD-10-CM | POA: Diagnosis not present

## 2016-08-15 DIAGNOSIS — I130 Hypertensive heart and chronic kidney disease with heart failure and stage 1 through stage 4 chronic kidney disease, or unspecified chronic kidney disease: Secondary | ICD-10-CM | POA: Diagnosis not present

## 2016-08-15 DIAGNOSIS — G4489 Other headache syndrome: Secondary | ICD-10-CM | POA: Diagnosis not present

## 2016-08-17 DIAGNOSIS — I872 Venous insufficiency (chronic) (peripheral): Secondary | ICD-10-CM | POA: Diagnosis not present

## 2016-08-17 DIAGNOSIS — M47812 Spondylosis without myelopathy or radiculopathy, cervical region: Secondary | ICD-10-CM | POA: Diagnosis not present

## 2016-08-17 DIAGNOSIS — M25512 Pain in left shoulder: Secondary | ICD-10-CM | POA: Diagnosis not present

## 2016-08-17 DIAGNOSIS — G4489 Other headache syndrome: Secondary | ICD-10-CM | POA: Diagnosis not present

## 2016-08-17 DIAGNOSIS — I130 Hypertensive heart and chronic kidney disease with heart failure and stage 1 through stage 4 chronic kidney disease, or unspecified chronic kidney disease: Secondary | ICD-10-CM | POA: Diagnosis not present

## 2016-08-17 DIAGNOSIS — M545 Low back pain: Secondary | ICD-10-CM | POA: Diagnosis not present

## 2016-08-21 DIAGNOSIS — Z8673 Personal history of transient ischemic attack (TIA), and cerebral infarction without residual deficits: Secondary | ICD-10-CM | POA: Diagnosis not present

## 2016-08-21 DIAGNOSIS — I872 Venous insufficiency (chronic) (peripheral): Secondary | ICD-10-CM | POA: Diagnosis not present

## 2016-08-21 DIAGNOSIS — I10 Essential (primary) hypertension: Secondary | ICD-10-CM | POA: Diagnosis not present

## 2016-08-21 DIAGNOSIS — R5381 Other malaise: Secondary | ICD-10-CM | POA: Diagnosis not present

## 2016-08-22 DIAGNOSIS — M25512 Pain in left shoulder: Secondary | ICD-10-CM | POA: Diagnosis not present

## 2016-08-22 DIAGNOSIS — G4489 Other headache syndrome: Secondary | ICD-10-CM | POA: Diagnosis not present

## 2016-08-22 DIAGNOSIS — M47812 Spondylosis without myelopathy or radiculopathy, cervical region: Secondary | ICD-10-CM | POA: Diagnosis not present

## 2016-08-22 DIAGNOSIS — I130 Hypertensive heart and chronic kidney disease with heart failure and stage 1 through stage 4 chronic kidney disease, or unspecified chronic kidney disease: Secondary | ICD-10-CM | POA: Diagnosis not present

## 2016-08-22 DIAGNOSIS — M545 Low back pain: Secondary | ICD-10-CM | POA: Diagnosis not present

## 2016-08-22 DIAGNOSIS — I872 Venous insufficiency (chronic) (peripheral): Secondary | ICD-10-CM | POA: Diagnosis not present

## 2016-08-24 DIAGNOSIS — G4489 Other headache syndrome: Secondary | ICD-10-CM | POA: Diagnosis not present

## 2016-08-24 DIAGNOSIS — M545 Low back pain: Secondary | ICD-10-CM | POA: Diagnosis not present

## 2016-08-24 DIAGNOSIS — I130 Hypertensive heart and chronic kidney disease with heart failure and stage 1 through stage 4 chronic kidney disease, or unspecified chronic kidney disease: Secondary | ICD-10-CM | POA: Diagnosis not present

## 2016-08-24 DIAGNOSIS — M25512 Pain in left shoulder: Secondary | ICD-10-CM | POA: Diagnosis not present

## 2016-08-24 DIAGNOSIS — I872 Venous insufficiency (chronic) (peripheral): Secondary | ICD-10-CM | POA: Diagnosis not present

## 2016-08-24 DIAGNOSIS — M47812 Spondylosis without myelopathy or radiculopathy, cervical region: Secondary | ICD-10-CM | POA: Diagnosis not present

## 2016-08-29 DIAGNOSIS — M47812 Spondylosis without myelopathy or radiculopathy, cervical region: Secondary | ICD-10-CM | POA: Diagnosis not present

## 2016-08-29 DIAGNOSIS — M25512 Pain in left shoulder: Secondary | ICD-10-CM | POA: Diagnosis not present

## 2016-08-29 DIAGNOSIS — M545 Low back pain: Secondary | ICD-10-CM | POA: Diagnosis not present

## 2016-08-29 DIAGNOSIS — G4489 Other headache syndrome: Secondary | ICD-10-CM | POA: Diagnosis not present

## 2016-08-29 DIAGNOSIS — I872 Venous insufficiency (chronic) (peripheral): Secondary | ICD-10-CM | POA: Diagnosis not present

## 2016-08-29 DIAGNOSIS — I130 Hypertensive heart and chronic kidney disease with heart failure and stage 1 through stage 4 chronic kidney disease, or unspecified chronic kidney disease: Secondary | ICD-10-CM | POA: Diagnosis not present

## 2016-08-31 DIAGNOSIS — M545 Low back pain: Secondary | ICD-10-CM | POA: Diagnosis not present

## 2016-08-31 DIAGNOSIS — G4489 Other headache syndrome: Secondary | ICD-10-CM | POA: Diagnosis not present

## 2016-08-31 DIAGNOSIS — M25512 Pain in left shoulder: Secondary | ICD-10-CM | POA: Diagnosis not present

## 2016-08-31 DIAGNOSIS — I130 Hypertensive heart and chronic kidney disease with heart failure and stage 1 through stage 4 chronic kidney disease, or unspecified chronic kidney disease: Secondary | ICD-10-CM | POA: Diagnosis not present

## 2016-08-31 DIAGNOSIS — I872 Venous insufficiency (chronic) (peripheral): Secondary | ICD-10-CM | POA: Diagnosis not present

## 2016-08-31 DIAGNOSIS — M47812 Spondylosis without myelopathy or radiculopathy, cervical region: Secondary | ICD-10-CM | POA: Diagnosis not present

## 2016-09-05 DIAGNOSIS — M545 Low back pain: Secondary | ICD-10-CM | POA: Diagnosis not present

## 2016-09-05 DIAGNOSIS — G4489 Other headache syndrome: Secondary | ICD-10-CM | POA: Diagnosis not present

## 2016-09-05 DIAGNOSIS — M47812 Spondylosis without myelopathy or radiculopathy, cervical region: Secondary | ICD-10-CM | POA: Diagnosis not present

## 2016-09-05 DIAGNOSIS — M25512 Pain in left shoulder: Secondary | ICD-10-CM | POA: Diagnosis not present

## 2016-09-05 DIAGNOSIS — I872 Venous insufficiency (chronic) (peripheral): Secondary | ICD-10-CM | POA: Diagnosis not present

## 2016-09-05 DIAGNOSIS — I130 Hypertensive heart and chronic kidney disease with heart failure and stage 1 through stage 4 chronic kidney disease, or unspecified chronic kidney disease: Secondary | ICD-10-CM | POA: Diagnosis not present

## 2016-09-07 ENCOUNTER — Other Ambulatory Visit: Payer: Self-pay | Admitting: Family Medicine

## 2016-09-08 NOTE — Telephone Encounter (Signed)
[place].  RF request for tamsulosin LOV: 08/03/16 Next ov: 11/01/16 Last written: 06/21/16 #30 w/ 6RF - incorrect qty sent

## 2016-09-26 ENCOUNTER — Ambulatory Visit (INDEPENDENT_AMBULATORY_CARE_PROVIDER_SITE_OTHER): Payer: Medicare Other | Admitting: Sports Medicine

## 2016-09-26 DIAGNOSIS — M79671 Pain in right foot: Secondary | ICD-10-CM

## 2016-09-26 DIAGNOSIS — I739 Peripheral vascular disease, unspecified: Secondary | ICD-10-CM | POA: Diagnosis not present

## 2016-09-26 DIAGNOSIS — M79672 Pain in left foot: Secondary | ICD-10-CM

## 2016-09-26 DIAGNOSIS — B351 Tinea unguium: Secondary | ICD-10-CM | POA: Diagnosis not present

## 2016-09-26 NOTE — Progress Notes (Signed)
Patient ID: Lance Kemp, male   DOB: 1922/07/16, 83 y.o.   MRN: [id number]   Subjective: Lance Kemp is a 83 y.o. male patient seen today in office with complaint of painful thickened and elongated toenails; unable to trim. Patient denies any changes with medical history since last visit. Patient is assisted by son at this visit who states that dad has moved in a house across the street and is adjusting well. Patient has no other pedal complaints at this time.   Patient Active Problem List   Diagnosis Date Noted  . Cellulitis of left lower extremity 04/25/2016  . Cellulitis 04/25/2016  . Basal cell carcinoma, face 04/07/2015  . Mild dementia 11/24/2014  . OAB (overactive bladder) 10/17/2014  . Essential hypertension 08/25/2014  . Hyperlipidemia 08/25/2014  . Peripheral edema 04/16/2014  . Chronic diastolic heart failure ([place]) 03/04/2014  . Chronic renal insufficiency, stage III (moderate)   . Chronic shoulder pain 02/19/2014  . SOB (shortness of breath) 02/18/2014  . Acute on chronic diastolic heart failure ([place]) 12/24/2013  . Lumbago 12/18/2013  . History of CVA (cerebrovascular accident) 11/21/2013  . BPH associated with nocturia 11/21/2013  . Short-term memory loss 10/24/2013  . Chronic left shoulder pain 09/14/2013  . Venous insufficiency of both lower extremities 09/14/2013  . HTN (hypertension), benign 09/14/2013    Current Outpatient Prescriptions on File Prior to Visit  Medication Sig Dispense Refill  . aspirin 81 MG chewable tablet Chew 81 mg by mouth daily.    Marland Kitchen atorvastatin (LIPITOR) 20 MG tablet Take 1 tablet (20 mg total) by mouth at bedtime. 30 tablet 6  . azelastine (OPTIVAR) 0.05 % ophthalmic solution Place 1 drop into both eyes 2 (two) times daily. 6 mL 6  . fexofenadine (ALLEGRA) 60 MG tablet 1 tab po bid prn itching 60 tablet 3  . finasteride (PROSCAR) 5 MG tablet Take 1 tablet (5 mg total) by mouth daily. 30 tablet 6  . furosemide (LASIX) 40 MG tablet Take  1 tablet (40 mg total) by mouth daily. 30 tablet 6  . hydrALAZINE (APRESOLINE) 10 MG tablet Take 1 tablet (10 mg total) by mouth 3 (three) times daily. 90 tablet 6  . levobunolol (BETAGAN) 0.5 % ophthalmic solution Place 1 drop into both eyes 2 (two) times daily. 5 mL 6  . mirabegron ER (MYRBETRIQ) 25 MG TB24 tablet Take 1 tablet (25 mg total) by mouth daily. 30 tablet 6  . polyethylene glycol powder (GLYCOLAX/MIRALAX) powder Take 17 g by mouth 2 (two) times daily as needed. 3350 g 6  . rivastigmine (EXELON) 1.5 MG capsule Take 1 capsule (1.5 mg total) by mouth 2 (two) times daily. 60 capsule 6  . tamsulosin (FLOMAX) 0.4 MG CAPS capsule TAKE 2 CAPSULES BY MOUTH AT BEDTIME 60 capsule 6   No current facility-administered medications on file prior to visit.     Allergies  Allergen Reactions  . Altace [Ramipril] Other (See Comments)    Reaction:  Unknown   . Amoxicillin Other (See Comments)    Reaction:  Unknown  Has patient had a PCN reaction causing immediate rash, facial/tongue/throat swelling, SOB or lightheadedness with hypotension: Unsure Has patient had a PCN reaction causing severe rash involving mucus membranes or skin necrosis: Unsure Has patient had a PCN reaction that required hospitalization Unsure Has patient had a PCN reaction occurring within the last 10 years: Unsure If all of the above answers are "NO", then may proceed with Cephalosporin use.  [name] Potassium] Other (  See Comments)    Reaction:  Unknown     Objective: Physical Exam  General: Well developed, nourished, no acute distress, awake, alert and oriented x 3  Vascular: Dorsalis pedis artery 1/4 bilateral, Posterior tibial artery 0/4 bilateral, +1 pitting edema bilateral, skin temperature warm to slightly cool proximal to distal bilateral lower extremities, + varicosities, no pedal hair present bilateral.  Neurological: Gross sensation present via light touch bilateral.   Dermatological: Skin is  warm, dry, and supple bilateral, Nails 1-10 are tender, long, thick, and discolored with mild subungal debris, no webspace macerations present bilateral, no open lesions present bilateral, no callus/corns/hyperkeratotic tissue present bilateral. No signs of infection bilateral.  Musculoskeletal: Mild asymptomatic hammertoe boney deformities noted bilateral. Muscular strength within normal limits without pain on range of motion. No pain with calf compression bilateral.  Assessment and Plan:  Problem List Items Addressed This Visit    None    Visit Diagnoses    Onychomycosis    -  Primary   Foot pain, bilateral       PVD (peripheral vascular disease) ([place])         -Examined patient.  -Discussed treatment options for painful mycotic nails. -Mechanically debrided and reduced mycotic nails with sterile nail nipper and dremel nail file without incident. -Recommend cont with compression stockings and lower leg elevation to assist with edema control -Recommend continue with good supportive shoes for foot type -Patient to return in 3 months for follow up nail care/ evaluation or sooner if symptoms worsen.  Landis Martins, DPM

## 2016-10-05 DIAGNOSIS — H40033 Anatomical narrow angle, bilateral: Secondary | ICD-10-CM | POA: Diagnosis not present

## 2016-10-05 DIAGNOSIS — H04123 Dry eye syndrome of bilateral lacrimal glands: Secondary | ICD-10-CM | POA: Diagnosis not present

## 2016-11-01 ENCOUNTER — Encounter: Payer: Self-pay | Admitting: Family Medicine

## 2016-11-01 ENCOUNTER — Ambulatory Visit (INDEPENDENT_AMBULATORY_CARE_PROVIDER_SITE_OTHER): Payer: Medicare Other | Admitting: Family Medicine

## 2016-11-01 VITALS — BP 124/69 | HR 71 | Temp 97.8°F | Resp 16 | Ht 67.0 in | Wt 146.5 lb

## 2016-11-01 DIAGNOSIS — I10 Essential (primary) hypertension: Secondary | ICD-10-CM

## 2016-11-01 DIAGNOSIS — L989 Disorder of the skin and subcutaneous tissue, unspecified: Secondary | ICD-10-CM | POA: Diagnosis not present

## 2016-11-01 DIAGNOSIS — N183 Chronic kidney disease, stage 3 unspecified: Secondary | ICD-10-CM

## 2016-11-01 DIAGNOSIS — R51 Headache: Secondary | ICD-10-CM | POA: Diagnosis not present

## 2016-11-01 DIAGNOSIS — I872 Venous insufficiency (chronic) (peripheral): Secondary | ICD-10-CM

## 2016-11-01 DIAGNOSIS — M47812 Spondylosis without myelopathy or radiculopathy, cervical region: Secondary | ICD-10-CM

## 2016-11-01 DIAGNOSIS — G4486 Cervicogenic headache: Secondary | ICD-10-CM

## 2016-11-01 LAB — BASIC METABOLIC PANEL
BUN: 23 mg/dL (ref 6–23)
CO2: 30 mEq/L (ref 19–32)
Calcium: 8.8 mg/dL (ref 8.4–10.5)
Chloride: 103 mEq/L (ref 96–112)
Creatinine, Ser: 1.14 mg/dL (ref 0.40–1.50)
GFR: 63.89 mL/min (ref >60.00)
GLUCOSE: 101 mg/dL — AB (ref 70–99)
POTASSIUM: 4.1 meq/L (ref 3.5–5.1)
SODIUM: 141 meq/L (ref 135–145)

## 2016-11-01 MED ORDER — TRAMADOL HCL 50 MG PO TABS: 50.0000 mg | ORAL_TABLET | Freq: Four times a day (QID) | ORAL | 5 refills | Status: DC | PRN

## 2016-11-01 MED ORDER — FUROSEMIDE 40 MG PO TABS: ORAL_TABLET | ORAL | 6 refills | Status: DC

## 2016-11-01 NOTE — Progress Notes (Signed)
OFFICE VISIT  11/01/2016   CC:  Chief Complaint  Patient presents with  . Follow-up    RCI,    HPI:    Patient is a 83 y.o. Caucasian male who presents for 3 mo f/u HTN, CRI stage III, chronic venous insufficiency edema.  LE venous insuff: swelling is minimal, takes 40mg  lasix daily and wears compression hose daily, tries to eat low Na diet. He gets up to urinate about 1-2 times per night.  HTN: no home bp monitoring, but a fairly recent [place] check was normal--same as today's.  CRI III: he avoids NSAIDs and tries to pay close attention to hydration.  He still c/o daily headaches that start in occipital region and extend some up the back of head to the vertex of skull. No nausea or photo/phono phobia.  No dizziness, no vision or hearing abnormalities associated. These have been going on for years, seem worse gradually over the years.  He has documented C spine DDD/DJD but interestingly he denies much neck pain.  He does have significant neck stiffness chronically. Tylenol does not help his pain. In the past (when he lived in [place]) he was taking tramadol for pain and tolerating this well.   Past Medical History:  Diagnosis Date  . Basal cell carcinoma 2011; 2016   Face  . BPH with obstruction/lower urinary tract symptoms   . Chronic back pain   . Chronic diastolic heart failure ([place])   . Chronic left shoulder pain 09/14/2013  . Chronic renal insufficiency, stage III (moderate) 2013   CrCl in the 50s-60s  . Chronic venous insufficiency    compression hose  . 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  No significant residual deficit except short term memory impairment  . Dementia 2016   Dr. Delice Lesch 08/2014--started aricept but pt did not tolerate.  Exelon trial started by Dr. Delice Lesch 05/2016.  Marland Kitchen Depression    antidepressant x 1 yr--situational. Resolved 08/2013 per pt/son.  . Frequent headaches   . Glaucoma   . Hypertension   . Normocytic anemia 2015   secondary  to CRI (iron studies ok 12/2013)  . NSTEMI (non-ST elevated myocardial infarction) ([place]) 06/2011   [place]: Troponin mildly elevated, normal EKG, normal ECHO, pt declined cardiology consult--per old PCP records.  Marland Kitchen OAB (overactive bladder)   . Osteoarthritis    hands/wrists  . Peripheral neuropathy 2013   Dx'd by neuro in [place] at the time the patient was hospitalized briefly for pontine CVA.  Marland Kitchen Shortness of breath   . Urine incontinence     Past Surgical History:  Procedure Laterality Date  . CHOLECYSTECTOMY    . NM VQ LUNG SCAN ([place] [facility])  09/2014   Low risk  . TONSILECTOMY, ADENOIDECTOMY, BILATERAL MYRINGOTOMY AND TUBES    . TRANSTHORACIC ECHOCARDIOGRAM  12/18/13   Grade 1 diast dysf, o/w normal    Outpatient Medications Prior to Visit  Medication Sig Dispense Refill  . aspirin 81 MG chewable tablet Chew 81 mg by mouth daily.    Marland Kitchen atorvastatin (LIPITOR) 20 MG tablet Take 1 tablet (20 mg total) by mouth at bedtime. 30 tablet 6  . azelastine (OPTIVAR) 0.05 % ophthalmic solution Place 1 drop into both eyes 2 (two) times daily. 6 mL 6  . fexofenadine (ALLEGRA) 60 MG tablet 1 tab po bid prn itching 60 tablet 3  . finasteride (PROSCAR) 5 MG tablet Take 1 tablet (5 mg total) by mouth daily. 30 tablet 6  . hydrALAZINE (APRESOLINE)  10 MG tablet Take 1 tablet (10 mg total) by mouth 3 (three) times daily. 90 tablet 6  . levobunolol (BETAGAN) 0.5 % ophthalmic solution Place 1 drop into both eyes 2 (two) times daily. 5 mL 6  . mirabegron ER (MYRBETRIQ) 25 MG TB24 tablet Take 1 tablet (25 mg total) by mouth daily. 30 tablet 6  . polyethylene glycol powder (GLYCOLAX/MIRALAX) powder Take 17 g by mouth 2 (two) times daily as needed. 3350 g 6  . rivastigmine (EXELON) 1.5 MG capsule Take 1 capsule (1.5 mg total) by mouth 2 (two) times daily. 60 capsule 6  . tamsulosin (FLOMAX) 0.4 MG CAPS capsule TAKE 2 CAPSULES BY MOUTH AT BEDTIME 60 capsule 6  . furosemide (LASIX) 40 MG tablet Take 1 tablet (40 mg  total) by mouth daily. 30 tablet 6   No facility-administered medications prior to visit.     Allergies  Allergen Reactions  . Altace [Ramipril] Other (See Comments)    Reaction:  Unknown   . Amoxicillin Other (See Comments)    Reaction:  Unknown  Has patient had a PCN reaction causing immediate rash, facial/tongue/throat swelling, SOB or lightheadedness with hypotension: Unsure Has patient had a PCN reaction causing severe rash involving mucus membranes or skin necrosis: Unsure Has patient had a PCN reaction that required hospitalization Unsure Has patient had a PCN reaction occurring within the last 10 years: Unsure If all of the above answers are "NO", then may proceed with Cephalosporin use.  Blanchard Kelch [Losartan Potassium] Other (See Comments)    Reaction:  Unknown     ROS As per HPI  PE: Blood pressure 124/69, pulse 71, temperature 97.8 F (36.6 C), temperature source Oral, resp. rate 16, height 5\' 7"  (1.702 m), weight 146 lb 8 oz (66.5 kg), SpO2 96 %. Gen: Alert, well appearing.  Patient is oriented to person, place, time, and situation. AFFECT: pleasant, lucid thought and speech. CV: RRR, no m/r/g.   LUNGS: CTA bilat, nonlabored resps, good aeration in all lung fields. EXT: trace bilat pitting edema (wearing compression stockings daily!).  No clubbing or cyanosis. SKIN: 2cm oblong, crusted papule just lateral to R eyebrow.  1 cm oval, crusted papule at base of R thumb.  LABS:  Lab Results  Component Value Date   TSH 3.390 02/18/2014   Lab Results  Component Value Date   WBC 6.9 06/30/2016   HGB 11.4 (L) 06/30/2016   HCT 35.0 (L) 06/30/2016   MCV 92.8 06/30/2016   PLT 194 06/30/2016   Lab Results  Component Value Date   CREATININE 1.18 08/03/2016   BUN 21 08/03/2016   NA 141 08/03/2016   K 4.1 08/03/2016   CL 104 08/03/2016   CO2 31 08/03/2016   Lab Results  Component Value Date   ALT 20 08/03/2016   AST 20 08/03/2016   ALKPHOS 76 08/03/2016   BILITOT  0.6 08/03/2016   Lab Results  Component Value Date   CHOL 141 10/24/2013   Lab Results  Component Value Date   HDL 60.60 10/24/2013   Lab Results  Component Value Date   LDLCALC 70 10/24/2013   Lab Results  Component Value Date   TRIG 51.0 10/24/2013   Lab Results  Component Value Date   CHOLHDL 2 10/24/2013   Lab Results  Component Value Date   HGBA1C 5.9 03/19/2014   IMPRESSION AND PLAN:  1) Cervicogenic headaches: recommended a course of PT--will order today. Start tramadol 50mg , 1 tab q6h prn, #60, RF  x 5.  2) HTN; The current medical regimen is effective;  continue present plan and medications. Lytes/Cr today.  3) CRI stage III: avoiding NSAIDs, hydrating adequately.  Check BMET today.  4) LE venous insufficiency edema: doing well with current measures, and in the interest of helping diminish his BPH symptoms we will decrease his lasix dosing to 1/2 of 40mg  tab qd.  5) Skin lesions: pt/son request referral to [place] (Dr. Onalee Hua office), so I ordered this today. These look like SCC or BCC.  An After Visit Summary was printed and given to the patient.  FOLLOW UP: Return in about 3 months (around 02/01/2017) for routine chronic illness f/u.  Signed:  Crissie Sickles, MD           11/01/2016

## 2016-11-02 ENCOUNTER — Encounter: Payer: Self-pay | Admitting: *Deleted

## 2016-11-07 DIAGNOSIS — N183 Chronic kidney disease, stage 3 (moderate): Secondary | ICD-10-CM | POA: Diagnosis not present

## 2016-11-07 DIAGNOSIS — M47812 Spondylosis without myelopathy or radiculopathy, cervical region: Secondary | ICD-10-CM | POA: Diagnosis not present

## 2016-11-07 DIAGNOSIS — I130 Hypertensive heart and chronic kidney disease with heart failure and stage 1 through stage 4 chronic kidney disease, or unspecified chronic kidney disease: Secondary | ICD-10-CM | POA: Diagnosis not present

## 2016-11-07 DIAGNOSIS — Z7982 Long term (current) use of aspirin: Secondary | ICD-10-CM | POA: Diagnosis not present

## 2016-11-07 DIAGNOSIS — R51 Headache: Secondary | ICD-10-CM | POA: Diagnosis not present

## 2016-11-07 DIAGNOSIS — F0390 Unspecified dementia without behavioral disturbance: Secondary | ICD-10-CM | POA: Diagnosis not present

## 2016-11-07 DIAGNOSIS — I509 Heart failure, unspecified: Secondary | ICD-10-CM | POA: Diagnosis not present

## 2016-11-09 DIAGNOSIS — M47812 Spondylosis without myelopathy or radiculopathy, cervical region: Secondary | ICD-10-CM | POA: Diagnosis not present

## 2016-11-09 DIAGNOSIS — R51 Headache: Secondary | ICD-10-CM | POA: Diagnosis not present

## 2016-11-09 DIAGNOSIS — I509 Heart failure, unspecified: Secondary | ICD-10-CM | POA: Diagnosis not present

## 2016-11-09 DIAGNOSIS — I130 Hypertensive heart and chronic kidney disease with heart failure and stage 1 through stage 4 chronic kidney disease, or unspecified chronic kidney disease: Secondary | ICD-10-CM | POA: Diagnosis not present

## 2016-11-09 DIAGNOSIS — N183 Chronic kidney disease, stage 3 (moderate): Secondary | ICD-10-CM | POA: Diagnosis not present

## 2016-11-09 DIAGNOSIS — F0390 Unspecified dementia without behavioral disturbance: Secondary | ICD-10-CM | POA: Diagnosis not present

## 2016-11-14 DIAGNOSIS — M47812 Spondylosis without myelopathy or radiculopathy, cervical region: Secondary | ICD-10-CM | POA: Diagnosis not present

## 2016-11-14 DIAGNOSIS — R51 Headache: Secondary | ICD-10-CM | POA: Diagnosis not present

## 2016-11-14 DIAGNOSIS — I130 Hypertensive heart and chronic kidney disease with heart failure and stage 1 through stage 4 chronic kidney disease, or unspecified chronic kidney disease: Secondary | ICD-10-CM | POA: Diagnosis not present

## 2016-11-14 DIAGNOSIS — F0390 Unspecified dementia without behavioral disturbance: Secondary | ICD-10-CM | POA: Diagnosis not present

## 2016-11-14 DIAGNOSIS — I509 Heart failure, unspecified: Secondary | ICD-10-CM | POA: Diagnosis not present

## 2016-11-14 DIAGNOSIS — N183 Chronic kidney disease, stage 3 (moderate): Secondary | ICD-10-CM | POA: Diagnosis not present

## 2016-11-16 DIAGNOSIS — M47812 Spondylosis without myelopathy or radiculopathy, cervical region: Secondary | ICD-10-CM | POA: Diagnosis not present

## 2016-11-16 DIAGNOSIS — F0390 Unspecified dementia without behavioral disturbance: Secondary | ICD-10-CM | POA: Diagnosis not present

## 2016-11-16 DIAGNOSIS — N183 Chronic kidney disease, stage 3 (moderate): Secondary | ICD-10-CM | POA: Diagnosis not present

## 2016-11-16 DIAGNOSIS — I130 Hypertensive heart and chronic kidney disease with heart failure and stage 1 through stage 4 chronic kidney disease, or unspecified chronic kidney disease: Secondary | ICD-10-CM | POA: Diagnosis not present

## 2016-11-16 DIAGNOSIS — R51 Headache: Secondary | ICD-10-CM | POA: Diagnosis not present

## 2016-11-16 DIAGNOSIS — I509 Heart failure, unspecified: Secondary | ICD-10-CM | POA: Diagnosis not present

## 2016-11-21 DIAGNOSIS — R51 Headache: Secondary | ICD-10-CM | POA: Diagnosis not present

## 2016-11-21 DIAGNOSIS — M47812 Spondylosis without myelopathy or radiculopathy, cervical region: Secondary | ICD-10-CM | POA: Diagnosis not present

## 2016-11-21 DIAGNOSIS — I130 Hypertensive heart and chronic kidney disease with heart failure and stage 1 through stage 4 chronic kidney disease, or unspecified chronic kidney disease: Secondary | ICD-10-CM | POA: Diagnosis not present

## 2016-11-21 DIAGNOSIS — I509 Heart failure, unspecified: Secondary | ICD-10-CM | POA: Diagnosis not present

## 2016-11-21 DIAGNOSIS — F0390 Unspecified dementia without behavioral disturbance: Secondary | ICD-10-CM | POA: Diagnosis not present

## 2016-11-21 DIAGNOSIS — N183 Chronic kidney disease, stage 3 (moderate): Secondary | ICD-10-CM | POA: Diagnosis not present

## 2016-11-23 DIAGNOSIS — I130 Hypertensive heart and chronic kidney disease with heart failure and stage 1 through stage 4 chronic kidney disease, or unspecified chronic kidney disease: Secondary | ICD-10-CM | POA: Diagnosis not present

## 2016-11-23 DIAGNOSIS — F0390 Unspecified dementia without behavioral disturbance: Secondary | ICD-10-CM | POA: Diagnosis not present

## 2016-11-23 DIAGNOSIS — R51 Headache: Secondary | ICD-10-CM | POA: Diagnosis not present

## 2016-11-23 DIAGNOSIS — M47812 Spondylosis without myelopathy or radiculopathy, cervical region: Secondary | ICD-10-CM | POA: Diagnosis not present

## 2016-11-23 DIAGNOSIS — I509 Heart failure, unspecified: Secondary | ICD-10-CM | POA: Diagnosis not present

## 2016-11-23 DIAGNOSIS — N183 Chronic kidney disease, stage 3 (moderate): Secondary | ICD-10-CM | POA: Diagnosis not present

## 2016-11-24 ENCOUNTER — Ambulatory Visit: Payer: Medicare Other | Admitting: Neurology

## 2016-11-29 DIAGNOSIS — I130 Hypertensive heart and chronic kidney disease with heart failure and stage 1 through stage 4 chronic kidney disease, or unspecified chronic kidney disease: Secondary | ICD-10-CM | POA: Diagnosis not present

## 2016-11-29 DIAGNOSIS — M47812 Spondylosis without myelopathy or radiculopathy, cervical region: Secondary | ICD-10-CM | POA: Diagnosis not present

## 2016-11-29 DIAGNOSIS — R51 Headache: Secondary | ICD-10-CM | POA: Diagnosis not present

## 2016-11-29 DIAGNOSIS — F0390 Unspecified dementia without behavioral disturbance: Secondary | ICD-10-CM | POA: Diagnosis not present

## 2016-11-29 DIAGNOSIS — I509 Heart failure, unspecified: Secondary | ICD-10-CM | POA: Diagnosis not present

## 2016-11-29 DIAGNOSIS — N183 Chronic kidney disease, stage 3 (moderate): Secondary | ICD-10-CM | POA: Diagnosis not present

## 2016-11-30 DIAGNOSIS — F0390 Unspecified dementia without behavioral disturbance: Secondary | ICD-10-CM | POA: Diagnosis not present

## 2016-11-30 DIAGNOSIS — I509 Heart failure, unspecified: Secondary | ICD-10-CM | POA: Diagnosis not present

## 2016-11-30 DIAGNOSIS — I130 Hypertensive heart and chronic kidney disease with heart failure and stage 1 through stage 4 chronic kidney disease, or unspecified chronic kidney disease: Secondary | ICD-10-CM | POA: Diagnosis not present

## 2016-11-30 DIAGNOSIS — M47812 Spondylosis without myelopathy or radiculopathy, cervical region: Secondary | ICD-10-CM | POA: Diagnosis not present

## 2016-11-30 DIAGNOSIS — R51 Headache: Secondary | ICD-10-CM | POA: Diagnosis not present

## 2016-11-30 DIAGNOSIS — N183 Chronic kidney disease, stage 3 (moderate): Secondary | ICD-10-CM | POA: Diagnosis not present

## 2016-12-05 DIAGNOSIS — I130 Hypertensive heart and chronic kidney disease with heart failure and stage 1 through stage 4 chronic kidney disease, or unspecified chronic kidney disease: Secondary | ICD-10-CM | POA: Diagnosis not present

## 2016-12-05 DIAGNOSIS — I509 Heart failure, unspecified: Secondary | ICD-10-CM | POA: Diagnosis not present

## 2016-12-05 DIAGNOSIS — R51 Headache: Secondary | ICD-10-CM | POA: Diagnosis not present

## 2016-12-05 DIAGNOSIS — M47812 Spondylosis without myelopathy or radiculopathy, cervical region: Secondary | ICD-10-CM | POA: Diagnosis not present

## 2016-12-05 DIAGNOSIS — N183 Chronic kidney disease, stage 3 (moderate): Secondary | ICD-10-CM | POA: Diagnosis not present

## 2016-12-05 DIAGNOSIS — F0390 Unspecified dementia without behavioral disturbance: Secondary | ICD-10-CM | POA: Diagnosis not present

## 2016-12-07 DIAGNOSIS — R51 Headache: Secondary | ICD-10-CM | POA: Diagnosis not present

## 2016-12-07 DIAGNOSIS — F0390 Unspecified dementia without behavioral disturbance: Secondary | ICD-10-CM | POA: Diagnosis not present

## 2016-12-07 DIAGNOSIS — M47812 Spondylosis without myelopathy or radiculopathy, cervical region: Secondary | ICD-10-CM | POA: Diagnosis not present

## 2016-12-07 DIAGNOSIS — N183 Chronic kidney disease, stage 3 (moderate): Secondary | ICD-10-CM | POA: Diagnosis not present

## 2016-12-07 DIAGNOSIS — I130 Hypertensive heart and chronic kidney disease with heart failure and stage 1 through stage 4 chronic kidney disease, or unspecified chronic kidney disease: Secondary | ICD-10-CM | POA: Diagnosis not present

## 2016-12-07 DIAGNOSIS — I509 Heart failure, unspecified: Secondary | ICD-10-CM | POA: Diagnosis not present

## 2016-12-12 DIAGNOSIS — C44319 Basal cell carcinoma of skin of other parts of face: Secondary | ICD-10-CM | POA: Diagnosis not present

## 2016-12-12 DIAGNOSIS — C4441 Basal cell carcinoma of skin of scalp and neck: Secondary | ICD-10-CM | POA: Diagnosis not present

## 2016-12-12 DIAGNOSIS — D229 Melanocytic nevi, unspecified: Secondary | ICD-10-CM | POA: Diagnosis not present

## 2016-12-12 DIAGNOSIS — C44622 Squamous cell carcinoma of skin of right upper limb, including shoulder: Secondary | ICD-10-CM | POA: Diagnosis not present

## 2016-12-26 ENCOUNTER — Other Ambulatory Visit: Payer: Self-pay | Admitting: Neurology

## 2016-12-26 ENCOUNTER — Other Ambulatory Visit: Payer: Self-pay | Admitting: Family Medicine

## 2016-12-26 ENCOUNTER — Ambulatory Visit: Payer: Medicare Other | Admitting: Sports Medicine

## 2016-12-26 DIAGNOSIS — F0390 Unspecified dementia without behavioral disturbance: Secondary | ICD-10-CM

## 2016-12-26 DIAGNOSIS — F03A0 Unspecified dementia, mild, without behavioral disturbance, psychotic disturbance, mood disturbance, and anxiety: Secondary | ICD-10-CM

## 2016-12-26 NOTE — Telephone Encounter (Signed)
[place].  RF request for myrbetriq LOV: 11/01/16 Next ov: 02/01/17 Last written: 06/21/16 #30 w/ 6RF

## 2017-01-02 ENCOUNTER — Ambulatory Visit: Payer: Medicare Other | Admitting: Sports Medicine

## 2017-01-04 DIAGNOSIS — C44319 Basal cell carcinoma of skin of other parts of face: Secondary | ICD-10-CM | POA: Diagnosis not present

## 2017-01-23 ENCOUNTER — Encounter: Payer: Self-pay | Admitting: Sports Medicine

## 2017-01-23 ENCOUNTER — Ambulatory Visit (INDEPENDENT_AMBULATORY_CARE_PROVIDER_SITE_OTHER): Payer: Medicare Other | Admitting: Sports Medicine

## 2017-01-23 DIAGNOSIS — I739 Peripheral vascular disease, unspecified: Secondary | ICD-10-CM | POA: Diagnosis not present

## 2017-01-23 DIAGNOSIS — B351 Tinea unguium: Secondary | ICD-10-CM

## 2017-01-23 DIAGNOSIS — M79672 Pain in left foot: Secondary | ICD-10-CM | POA: Diagnosis not present

## 2017-01-23 DIAGNOSIS — M79671 Pain in right foot: Secondary | ICD-10-CM | POA: Diagnosis not present

## 2017-01-23 NOTE — Progress Notes (Signed)
Patient ID: Lance Kemp, male   DOB: 12-26-1922, 83 y.o.   MRN: [id number]   Subjective: Lance Kemp is a 83 y.o. male patient seen today in office with complaint of painful thickened and elongated toenails; unable to trim. Patient denies any changes with medical history since last visit. Patient is assisted by son at this visit. Patient has no other pedal complaints at this time.   Patient Active Problem List   Diagnosis Date Noted  . Cellulitis of left lower extremity 04/25/2016  . Cellulitis 04/25/2016  . Basal cell carcinoma, face 04/07/2015  . Mild dementia 11/24/2014  . OAB (overactive bladder) 10/17/2014  . Essential hypertension 08/25/2014  . Hyperlipidemia 08/25/2014  . Peripheral edema 04/16/2014  . Chronic diastolic heart failure ([place]) 03/04/2014  . Chronic renal insufficiency, stage III (moderate)   . Chronic shoulder pain 02/19/2014  . SOB (shortness of breath) 02/18/2014  . Acute on chronic diastolic heart failure ([place]) 12/24/2013  . Lumbago 12/18/2013  . History of CVA (cerebrovascular accident) 11/21/2013  . BPH associated with nocturia 11/21/2013  . Short-term memory loss 10/24/2013  . Chronic left shoulder pain 09/14/2013  . Venous insufficiency of both lower extremities 09/14/2013  . HTN (hypertension), benign 09/14/2013    Current Outpatient Prescriptions on File Prior to Visit  Medication Sig Dispense Refill  . aspirin 81 MG chewable tablet Chew 81 mg by mouth daily.    Marland Kitchen atorvastatin (LIPITOR) 20 MG tablet Take 1 tablet (20 mg total) by mouth at bedtime. 30 tablet 6  . azelastine (OPTIVAR) 0.05 % ophthalmic solution Place 1 drop into both eyes 2 (two) times daily. 6 mL 6  . fexofenadine (ALLEGRA) 60 MG tablet 1 tab po bid prn itching 60 tablet 3  . finasteride (PROSCAR) 5 MG tablet Take 1 tablet (5 mg total) by mouth daily. 30 tablet 6  . furosemide (LASIX) 40 MG tablet 1/2 tab po qd 15 tablet 6  . hydrALAZINE (APRESOLINE) 10 MG tablet Take 1 tablet (10  mg total) by mouth 3 (three) times daily. 90 tablet 6  . levobunolol (BETAGAN) 0.5 % ophthalmic solution Place 1 drop into both eyes 2 (two) times daily. 5 mL 6  . MYRBETRIQ 25 MG TB24 tablet TAKE ONE TABLET BY MOUTH EVERY DAY 30 tablet 5  . polyethylene glycol powder (GLYCOLAX/MIRALAX) powder Take 17 g by mouth 2 (two) times daily as needed. 3350 g 6  . rivastigmine (EXELON) 1.5 MG capsule Take 1 capsule (1.5 mg total) by mouth 2 (two) times daily. 60 capsule 6  . rivastigmine (EXELON) 1.5 MG capsule TAKE 1 CAPSULE BY MOUTH TWICE DAILY 180 capsule 0  . tamsulosin (FLOMAX) 0.4 MG CAPS capsule TAKE 2 CAPSULES BY MOUTH AT BEDTIME 60 capsule 6  . traMADol (ULTRAM) 50 MG tablet Take 1 tablet (50 mg total) by mouth every 6 (six) hours as needed. 60 tablet 5   No current facility-administered medications on file prior to visit.     Allergies  Allergen Reactions  . Altace [Ramipril] Other (See Comments)    Reaction:  Unknown   . Amoxicillin Other (See Comments)    Reaction:  Unknown  Has patient had a PCN reaction causing immediate rash, facial/tongue/throat swelling, SOB or lightheadedness with hypotension: Unsure Has patient had a PCN reaction causing severe rash involving mucus membranes or skin necrosis: Unsure Has patient had a PCN reaction that required hospitalization Unsure Has patient had a PCN reaction occurring within the last 10 years: Unsure If all of the  above answers are "NO", then may proceed with Cephalosporin use.  Blanchard Kelch [Losartan Potassium] Other (See Comments)    Reaction:  Unknown     Objective: Physical Exam  General: Well developed, nourished, no acute distress, awake, alert and oriented x 3  Vascular: Dorsalis pedis artery 1/4 bilateral, Posterior tibial artery 0/4 bilateral, +1 pitting edema bilateral, skin temperature warm to slightly cool proximal to distal bilateral lower extremities, + varicosities, no pedal hair present bilateral.  Neurological: Gross  sensation present via light touch bilateral.   Dermatological: Skin is warm, dry, and supple bilateral, Nails 1-10 are tender, long, thick, and discolored with mild subungal debris, no webspace macerations present bilateral, no open lesions present bilateral, no callus/corns/hyperkeratotic tissue present bilateral. No signs of infection bilateral.  Musculoskeletal: Mild asymptomatic hammertoe boney deformities noted bilateral. Muscular strength within normal limits without pain on range of motion. No pain with calf compression bilateral.  Assessment and Plan:  Problem List Items Addressed This Visit    None    Visit Diagnoses    Onychomycosis    -  Primary   Foot pain, bilateral       PVD (peripheral vascular disease) ([place])         -Examined patient.  -Discussed treatment options for painful mycotic nails. -Mechanically debrided and reduced mycotic nails with sterile nail nipper and dremel nail file without incident. -Recommend cont with compression stockings and lower leg elevation to assist with edema control -Recommend continue with good supportive shoes for foot type -Patient to return in 3 months for follow up nail care/ evaluation or sooner if symptoms worsen.  Landis Martins, DPM

## 2017-01-24 ENCOUNTER — Other Ambulatory Visit: Payer: Self-pay | Admitting: Family Medicine

## 2017-01-25 MED ORDER — FUROSEMIDE 40 MG PO TABS: ORAL_TABLET | ORAL | 5 refills | Status: DC

## 2017-01-25 MED ORDER — ATORVASTATIN CALCIUM 20 MG PO TABS: 20.0000 mg | ORAL_TABLET | Freq: Every day | ORAL | 5 refills | Status: DC

## 2017-01-25 NOTE — Telephone Encounter (Signed)
[facility].  

## 2017-01-25 NOTE — Addendum Note (Signed)
Addended by: Onalee Hua on: 01/25/2017 07:54 AM   Modules accepted: Orders

## 2017-02-01 ENCOUNTER — Encounter: Payer: Self-pay | Admitting: Family Medicine

## 2017-02-01 ENCOUNTER — Ambulatory Visit (INDEPENDENT_AMBULATORY_CARE_PROVIDER_SITE_OTHER): Payer: Medicare Other | Admitting: Family Medicine

## 2017-02-01 VITALS — BP 109/53 | HR 65 | Temp 98.7°F | Resp 16 | Ht 67.0 in | Wt 144.8 lb

## 2017-02-01 DIAGNOSIS — R609 Edema, unspecified: Secondary | ICD-10-CM

## 2017-02-01 NOTE — Patient Instructions (Signed)
Take hydralazine, 1 10 mg tab every evening for 5 days, then discontinue this medication.  Buy an upper arm bp cuff for home: check bp and HR daily, call if bp consistently greater than 140/90.

## 2017-02-01 NOTE — Progress Notes (Signed)
OFFICE VISIT  02/01/2017   CC:  Chief Complaint  Patient presents with  . Follow-up    RCI   HPI:    Patient is a 83 y.o. Caucasian male who presents for 3 mo f/u HTN, CRI stage III, and chronic LE venous insufficiency edema. He was also having cervicogenic HAs which I rx'd tramadol and PT for. PT did come for an hour 1 x/week for 1 mo.  Tramadol use made him feel loopy/sedated and he said he didn't want to take it anymore + it didn't help the pain.  Still having HA's, mostly occipital but some in forehead region.  He has to stop taking the myrbetriq b/c it has become too costly.  No home bp monitoring is being done.  Pt compliant with hydralazine but usually only gets it morning and evening, misses mid day dose.  Wt is down 2 lbs, says he is eating same as always.  Takes 1/2 of 40 mg lasix daily.  No dizziness.  Gets up 2 times per night to urinate. Says swelling in legs is stable, wears compression hose daily.   Past Medical History:  Diagnosis Date  . Basal cell carcinoma 2011; 2016   Face  . BPH with obstruction/lower urinary tract symptoms   . Chronic back pain   . Chronic diastolic heart failure ([place])   . Chronic left shoulder pain 09/14/2013  . Chronic renal insufficiency, stage III (moderate) 2013   CrCl in the 50s-60s  . Chronic venous insufficiency    compression hose  . 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  No significant residual deficit except short term memory impairment  . Dementia 2016   Dr. Delice Lesch 08/2014--started aricept but pt did not tolerate.  Exelon trial started by Dr. Delice Lesch 05/2016.  Marland Kitchen Depression    antidepressant x 1 yr--situational. Resolved 08/2013 per pt/son.  . Frequent headaches   . Glaucoma   . Hypertension   . Normocytic anemia 2015   secondary to CRI (iron studies ok 12/2013)  . NSTEMI (non-ST elevated myocardial infarction) ([place]) 06/2011   [place]: Troponin mildly elevated, normal EKG, normal ECHO, pt declined cardiology  consult--per old PCP records.  Marland Kitchen OAB (overactive bladder)   . Osteoarthritis    hands/wrists  . Peripheral neuropathy 2013   Dx'd by neuro in [place] at the time the patient was hospitalized briefly for pontine CVA.  Marland Kitchen Shortness of breath   . Urine incontinence     Past Surgical History:  Procedure Laterality Date  . CHOLECYSTECTOMY    . NM VQ LUNG SCAN ([place] [facility])  09/2014   Low risk  . TONSILECTOMY, ADENOIDECTOMY, BILATERAL MYRINGOTOMY AND TUBES    . TRANSTHORACIC ECHOCARDIOGRAM  12/18/13   Grade 1 diast dysf, o/w normal    Outpatient Medications Prior to Visit  Medication Sig Dispense Refill  . aspirin 81 MG chewable tablet Chew 81 mg by mouth daily.    Marland Kitchen atorvastatin (LIPITOR) 20 MG tablet Take 1 tablet (20 mg total) by mouth at bedtime. 30 tablet 5  . azelastine (OPTIVAR) 0.05 % ophthalmic solution Place 1 drop into both eyes 2 (two) times daily. 6 mL 6  . fexofenadine (ALLEGRA) 60 MG tablet 1 tab po bid prn itching 60 tablet 3  . finasteride (PROSCAR) 5 MG tablet TAKE ONE TABLET BY MOUTH EVERY DAY 30 tablet 5  . furosemide (LASIX) 40 MG tablet 1/2 tab po qd 15 tablet 5  . levobunolol (BETAGAN) 0.5 % ophthalmic solution Place 1 drop  into both eyes 2 (two) times daily. 5 mL 6  . polyethylene glycol powder (GLYCOLAX/MIRALAX) powder Take 17 g by mouth 2 (two) times daily as needed. 3350 g 6  . rivastigmine (EXELON) 1.5 MG capsule Take 1 capsule (1.5 mg total) by mouth 2 (two) times daily. 60 capsule 6  . rivastigmine (EXELON) 1.5 MG capsule TAKE 1 CAPSULE BY MOUTH TWICE DAILY 180 capsule 0  . tamsulosin (FLOMAX) 0.4 MG CAPS capsule TAKE 2 CAPSULES BY MOUTH AT BEDTIME 60 capsule 6  . traMADol (ULTRAM) 50 MG tablet Take 1 tablet (50 mg total) by mouth every 6 (six) hours as needed. 60 tablet 5  . hydrALAZINE (APRESOLINE) 10 MG tablet TAKE ONE TABLET BY MOUTH 3 TIMES DAILY 90 tablet 5  . MYRBETRIQ 25 MG TB24 tablet TAKE ONE TABLET BY MOUTH EVERY DAY 30 tablet 5   No  facility-administered medications prior to visit.     Allergies  Allergen Reactions  . Altace [Ramipril] Other (See Comments)    Reaction:  Unknown   . Amoxicillin Other (See Comments)    Reaction:  Unknown  Has patient had a PCN reaction causing immediate rash, facial/tongue/throat swelling, SOB or lightheadedness with hypotension: Unsure Has patient had a PCN reaction causing severe rash involving mucus membranes or skin necrosis: Unsure Has patient had a PCN reaction that required hospitalization Unsure Has patient had a PCN reaction occurring within the last 10 years: Unsure If all of the above answers are "NO", then may proceed with Cephalosporin use.  Blanchard Kelch [Losartan Potassium] Other (See Comments)    Reaction:  Unknown     ROS As per HPI  PE: Blood pressure (!) 109/53, pulse 65, temperature 98.7 F (37.1 C), temperature source Temporal, resp. rate 16, height 5\' 7"  (1.702 m), weight 144 lb 12 oz (65.7 kg), SpO2 96 %. Gen: Alert, well appearing.  Patient is oriented to person, place, time, and situation. AFFECT: pleasant, lucid thought and speech. CV: RRR, no m/r/g.   LUNGS: CTA bilat, nonlabored resps, good aeration in all lung fields. EXT: 1+ pitting edema bilat LL's, wearing compression hose.  No erythema or skin breakdown.  LABS:    Chemistry      Component Value Date/Time   NA 141 11/01/2016 1533   K 4.1 11/01/2016 1533   CL 103 11/01/2016 1533   CO2 30 11/01/2016 1533   BUN 23 11/01/2016 1533   CREATININE 1.14 11/01/2016 1533   CREATININE 1.12 (H) 09/24/2015 1447      Component Value Date/Time   CALCIUM 8.8 11/01/2016 1533   ALKPHOS 76 08/03/2016 1534   AST 20 08/03/2016 1534   ALT 20 08/03/2016 1534   BILITOT 0.6 08/03/2016 1534     Lab Results  Component Value Date   WBC 6.9 06/30/2016   HGB 11.4 (L) 06/30/2016   HCT 35.0 (L) 06/30/2016   MCV 92.8 06/30/2016   PLT 194 06/30/2016    IMPRESSION AND PLAN:  1) HTN: bp low-normal today.  I will  ween him off hydralazine. His son will get a home bp cuff and monitor bp, call if consistently >140/90.  2) Headaches: tension/cervicogenic component--no improvement with PT and he had adverse side effects from tramadol.  Will see if the hydralazine is the cause of these--ween him off of this med: take 10 mg tab once daily for 5d, then stop.  3) LE venous insufficiency edema: looking great.  Continue low Na diet, compression stockings, and 1/2 of 40mg  lasix qd.  We have some room to space out the lasix to qod dosing, or change it to prn in the future.  4) CRI stage III: tried to draw blood for BMET today but pt had a mild vasovagal reaction. We can put this off until next f/u visit in 3 mo since these labs have been stable over the last 3 f/u visits.  An After Visit Summary was printed and given to the patient.  FOLLOW UP: Return in about 3 months (around 05/04/2017) for routine chronic illness f/u.  Signed:  Crissie Sickles, MD           02/01/2017

## 2017-02-04 ENCOUNTER — Encounter (HOSPITAL_COMMUNITY): Payer: Self-pay | Admitting: *Deleted

## 2017-02-04 ENCOUNTER — Emergency Department (HOSPITAL_COMMUNITY): Payer: Medicare Other

## 2017-02-04 ENCOUNTER — Observation Stay (HOSPITAL_COMMUNITY)
Admission: EM | Admit: 2017-02-04 | Discharge: 2017-02-05 | Disposition: A | Discharge status: Home / Self Care | Payer: Medicare Other | Attending: Internal Medicine | Admitting: Internal Medicine

## 2017-02-04 DIAGNOSIS — Z85828 Personal history of other malignant neoplasm of skin: Secondary | ICD-10-CM | POA: Insufficient documentation

## 2017-02-04 DIAGNOSIS — R0789 Other chest pain: Secondary | ICD-10-CM | POA: Diagnosis not present

## 2017-02-04 DIAGNOSIS — N183 Chronic kidney disease, stage 3 (moderate): Secondary | ICD-10-CM | POA: Diagnosis not present

## 2017-02-04 DIAGNOSIS — Z79899 Other long term (current) drug therapy: Secondary | ICD-10-CM | POA: Diagnosis not present

## 2017-02-04 DIAGNOSIS — Z7982 Long term (current) use of aspirin: Secondary | ICD-10-CM | POA: Insufficient documentation

## 2017-02-04 DIAGNOSIS — I252 Old myocardial infarction: Secondary | ICD-10-CM | POA: Insufficient documentation

## 2017-02-04 DIAGNOSIS — Z87891 Personal history of nicotine dependence: Secondary | ICD-10-CM | POA: Insufficient documentation

## 2017-02-04 DIAGNOSIS — R42 Dizziness and giddiness: Secondary | ICD-10-CM

## 2017-02-04 DIAGNOSIS — R51 Headache: Secondary | ICD-10-CM | POA: Diagnosis not present

## 2017-02-04 DIAGNOSIS — R079 Chest pain, unspecified: Secondary | ICD-10-CM | POA: Diagnosis not present

## 2017-02-04 DIAGNOSIS — R531 Weakness: Secondary | ICD-10-CM | POA: Diagnosis not present

## 2017-02-04 DIAGNOSIS — I10 Essential (primary) hypertension: Secondary | ICD-10-CM | POA: Diagnosis not present

## 2017-02-04 DIAGNOSIS — I130 Hypertensive heart and chronic kidney disease with heart failure and stage 1 through stage 4 chronic kidney disease, or unspecified chronic kidney disease: Secondary | ICD-10-CM | POA: Diagnosis not present

## 2017-02-04 DIAGNOSIS — Z8673 Personal history of transient ischemic attack (TIA), and cerebral infarction without residual deficits: Secondary | ICD-10-CM | POA: Diagnosis not present

## 2017-02-04 DIAGNOSIS — G4489 Other headache syndrome: Secondary | ICD-10-CM | POA: Diagnosis not present

## 2017-02-04 DIAGNOSIS — R0602 Shortness of breath: Secondary | ICD-10-CM | POA: Diagnosis not present

## 2017-02-04 DIAGNOSIS — R519 Headache, unspecified: Secondary | ICD-10-CM

## 2017-02-04 DIAGNOSIS — I5032 Chronic diastolic (congestive) heart failure: Secondary | ICD-10-CM | POA: Diagnosis not present

## 2017-02-04 DIAGNOSIS — R061 Stridor: Secondary | ICD-10-CM | POA: Diagnosis not present

## 2017-02-04 LAB — CBC WITH DIFFERENTIAL/PLATELET
Basophils Absolute: 0 10*3/uL (ref 0.0–0.1)
Basophils Relative: 0 %
EOS ABS: 0.3 10*3/uL (ref 0.0–0.7)
EOS PCT: 4 %
HCT: 30.9 % — ABNORMAL LOW (ref 39.0–52.0)
HEMOGLOBIN: 10.8 g/dL — AB (ref 13.0–17.0)
Lymphocytes Relative: 20 %
Lymphs Abs: 1.4 10*3/uL (ref 0.7–4.0)
MCH: 31.8 pg (ref 26.0–34.0)
MCHC: 35 g/dL (ref 30.0–36.0)
MCV: 90.9 fL (ref 78.0–100.0)
MONOS PCT: 10 %
Monocytes Absolute: 0.7 10*3/uL (ref 0.1–1.0)
NEUTROS ABS: 4.5 10*3/uL (ref 1.7–7.7)
NEUTROS PCT: 66 %
PLATELETS: 170 10*3/uL (ref 150–400)
RBC: 3.4 MIL/uL — ABNORMAL LOW (ref 4.22–5.81)
RDW: 13.4 % (ref 11.5–15.5)
WBC: 6.8 10*3/uL (ref 4.0–10.5)

## 2017-02-04 LAB — COMPREHENSIVE METABOLIC PANEL
ALBUMIN: 3.1 g/dL — AB (ref 3.5–5.0)
ALT: 22 U/L (ref 17–63)
ANION GAP: 9 (ref 5–15)
AST: 28 U/L (ref 15–41)
Alkaline Phosphatase: 68 U/L (ref 38–126)
BILIRUBIN TOTAL: 1.3 mg/dL — AB (ref 0.3–1.2)
BUN: 16 mg/dL (ref 6–20)
CALCIUM: 8.6 mg/dL — AB (ref 8.9–10.3)
CO2: 22 mmol/L (ref 22–32)
Chloride: 107 mmol/L (ref 101–111)
Creatinine, Ser: 0.96 mg/dL (ref 0.61–1.24)
GFR calc non Af Amer: >60 mL/min (ref >60)
GLUCOSE: 99 mg/dL (ref 65–99)
POTASSIUM: 3.6 mmol/L (ref 3.5–5.1)
Sodium: 138 mmol/L (ref 135–145)
TOTAL PROTEIN: 6 g/dL — AB (ref 6.5–8.1)

## 2017-02-04 LAB — URINALYSIS, ROUTINE W REFLEX MICROSCOPIC
BILIRUBIN URINE: NEGATIVE
Glucose, UA: NEGATIVE mg/dL
Hgb urine dipstick: NEGATIVE
KETONES UR: NEGATIVE mg/dL
LEUKOCYTES UA: NEGATIVE
NITRITE: NEGATIVE
PH: 9 — AB (ref 5.0–8.0)
PROTEIN: NEGATIVE mg/dL
Specific Gravity, Urine: 1.01 (ref 1.005–1.030)

## 2017-02-04 LAB — TROPONIN I: Troponin I: <0.03 ng/mL (ref <0.03)

## 2017-02-04 LAB — SEDIMENTATION RATE: SED RATE: 10 mm/h (ref 0–16)

## 2017-02-04 MED ORDER — TRAMADOL HCL 50 MG PO TABS: 50.0000 mg | ORAL_TABLET | Freq: Four times a day (QID) | ORAL | Status: DC | PRN

## 2017-02-04 MED ORDER — RIVASTIGMINE TARTRATE 1.5 MG PO CAPS
1.5000 mg | ORAL_CAPSULE | Freq: Two times a day (BID) | ORAL | Status: DC
  Administered 2017-02-04 – 2017-02-05 (×3): 1.5 mg via ORAL
  Filled 2017-02-04 (×7): qty 1

## 2017-02-04 MED ORDER — SODIUM CHLORIDE 0.9 % IV BOLUS (SEPSIS)
500.0000 mL | Freq: Once | INTRAVENOUS | Status: AC
  Administered 2017-02-04: 500 mL via INTRAVENOUS

## 2017-02-04 MED ORDER — TAMSULOSIN HCL 0.4 MG PO CAPS
0.8000 mg | ORAL_CAPSULE | Freq: Every day | ORAL | Status: DC
  Administered 2017-02-04: 0.8 mg via ORAL
  Filled 2017-02-04: qty 2

## 2017-02-04 MED ORDER — ASPIRIN 81 MG PO CHEW
81.0000 mg | CHEWABLE_TABLET | Freq: Every day | ORAL | Status: DC
Start: 2017-02-04 — End: 2017-02-05
  Administered 2017-02-04 – 2017-02-05 (×2): 81 mg via ORAL
  Filled 2017-02-04 (×2): qty 1

## 2017-02-04 MED ORDER — POLYETHYLENE GLYCOL 3350 17 G PO PACK: 17.0000 g | PACK | Freq: Two times a day (BID) | ORAL | Status: DC | PRN

## 2017-02-04 MED ORDER — LEVOBUNOLOL HCL 0.5 % OP SOLN
1.0000 [drp] | Freq: Two times a day (BID) | OPHTHALMIC | Status: DC
  Administered 2017-02-04 – 2017-02-05 (×3): 1 [drp] via OPHTHALMIC
  Filled 2017-02-04: qty 5

## 2017-02-04 MED ORDER — ATORVASTATIN CALCIUM 20 MG PO TABS
20.0000 mg | ORAL_TABLET | Freq: Every day | ORAL | Status: DC
  Administered 2017-02-04: 20 mg via ORAL
  Filled 2017-02-04 (×2): qty 1

## 2017-02-04 MED ORDER — FINASTERIDE 5 MG PO TABS
5.0000 mg | ORAL_TABLET | Freq: Every day | ORAL | Status: DC
Start: 2017-02-04 — End: 2017-02-05
  Administered 2017-02-04 – 2017-02-05 (×2): 5 mg via ORAL
  Filled 2017-02-04 (×4): qty 1

## 2017-02-04 MED ORDER — AMLODIPINE BESYLATE 5 MG PO TABS
5.0000 mg | ORAL_TABLET | Freq: Every day | ORAL | Status: DC
  Administered 2017-02-04 – 2017-02-05 (×2): 5 mg via ORAL
  Filled 2017-02-04 (×2): qty 1

## 2017-02-04 NOTE — ED Triage Notes (Signed)
Pt comes in from home for right temporal head pain starting at 0700. States it's like no pain he has ever had. NIH 0, per EMS. Pt has had right eye blurriness as well. While in route here, pt began having chest pain. EDP Cook at bedside

## 2017-02-04 NOTE — ED Provider Notes (Signed)
[facility] DEPT Provider Note   CSN: [id number] Arrival date & time: 02/04/17  0825     History   Chief Complaint Chief Complaint  Patient presents with  . Headache  . Chest Pain    HPI Lance Kemp is a 83 y.o. male.  Level V caveat for urgent need for intervention. Patient lives independently at home. He reports a right temporal headache, sense of room spinning, and anterior chest pain at approximately 0700 today. He has previously been well. No fever, chills, cough, dysuria. He states he is unable to stand up independently.      Past Medical History:  Diagnosis Date  . Basal cell carcinoma 2011; 2016   Face  . BPH with obstruction/lower urinary tract symptoms   . Chronic back pain   . Chronic diastolic heart failure ([place])   . Chronic left shoulder pain 09/14/2013  . Chronic renal insufficiency, stage III (moderate) 2013   CrCl in the 50s-60s  . Chronic venous insufficiency    compression hose  . 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  No significant residual deficit except short term memory impairment  . Dementia 2016   Dr. Delice Lesch 08/2014--started aricept but pt did not tolerate.  Exelon trial started by Dr. Delice Lesch 05/2016.  Marland Kitchen Depression    antidepressant x 1 yr--situational. Resolved 08/2013 per pt/son.  . Frequent headaches   . Glaucoma   . Hypertension   . Normocytic anemia 2015   secondary to CRI (iron studies ok 12/2013)  . NSTEMI (non-ST elevated myocardial infarction) ([place]) 06/2011   [place]: Troponin mildly elevated, normal EKG, normal ECHO, pt declined cardiology consult--per old PCP records.  Marland Kitchen OAB (overactive bladder)   . Osteoarthritis    hands/wrists  . Peripheral neuropathy 2013   Dx'd by neuro in [place] at the time the patient was hospitalized briefly for pontine CVA.  Marland Kitchen Shortness of breath   . Urine incontinence     Patient Active Problem List   Diagnosis Date Noted  . Vertigo 02/04/2017  . Cellulitis of left lower extremity  04/25/2016  . Cellulitis 04/25/2016  . Basal cell carcinoma, face 04/07/2015  . Mild dementia 11/24/2014  . OAB (overactive bladder) 10/17/2014  . Essential hypertension 08/25/2014  . Hyperlipidemia 08/25/2014  . Peripheral edema 04/16/2014  . Chronic diastolic heart failure ([place]) 03/04/2014  . Chronic renal insufficiency, stage III (moderate)   . Chronic shoulder pain 02/19/2014  . SOB (shortness of breath) 02/18/2014  . Acute on chronic diastolic heart failure ([place]) 12/24/2013  . Lumbago 12/18/2013  . History of CVA (cerebrovascular accident) 11/21/2013  . BPH associated with nocturia 11/21/2013  . Short-term memory loss 10/24/2013  . Chronic left shoulder pain 09/14/2013  . Venous insufficiency of both lower extremities 09/14/2013  . HTN (hypertension), benign 09/14/2013    Past Surgical History:  Procedure Laterality Date  . CHOLECYSTECTOMY    . NM VQ LUNG SCAN ([place] [facility])  09/2014   Low risk  . TONSILECTOMY, ADENOIDECTOMY, BILATERAL MYRINGOTOMY AND TUBES    . TRANSTHORACIC ECHOCARDIOGRAM  12/18/13   Grade 1 diast dysf, o/w normal       Home Medications    Prior to Admission medications   Medication Sig Start Date End Date Taking? Authorizing Provider  aspirin 81 MG chewable tablet Chew 81 mg by mouth daily.   Yes [provider]  atorvastatin (LIPITOR) 20 MG tablet Take 1 tablet (20 mg total) by mouth at bedtime. 01/25/17  Yes McGowen, [name], MD  azelastine (OPTIVAR) 0.05 % ophthalmic solution Place 1 drop into both eyes 2 (two) times daily. 06/21/16  Yes McGowen, [name], MD  fexofenadine (ALLEGRA) 60 MG tablet 1 tab po bid prn itching 08/03/16  Yes McGowen, [name], MD  finasteride (PROSCAR) 5 MG tablet TAKE ONE TABLET BY MOUTH EVERY DAY 01/25/17  Yes McGowen, [name], MD  furosemide (LASIX) 40 MG tablet 1/2 tab po qd 01/25/17  Yes McGowen, [name], MD  levobunolol (BETAGAN) 0.5 % ophthalmic solution Place 1 drop into both eyes 2 (two) times daily. 06/21/16  Yes  McGowen, [name], MD  polyethylene glycol powder (GLYCOLAX/MIRALAX) powder Take 17 g by mouth 2 (two) times daily as needed. 06/21/16  Yes McGowen, [name], MD  rivastigmine (EXELON) 1.5 MG capsule TAKE 1 CAPSULE BY MOUTH TWICE DAILY 12/26/16  Yes Cameron Sprang, MD  tamsulosin (FLOMAX) 0.4 MG CAPS capsule TAKE 2 CAPSULES BY MOUTH AT BEDTIME 09/08/16  Yes McGowen, [name], MD  traMADol (ULTRAM) 50 MG tablet Take 1 tablet (50 mg total) by mouth every 6 (six) hours as needed. 11/01/16  Yes McGowen, [name], MD    Family History Family History  Problem Relation Age of Onset  . Cancer Mother   . Heart disease Mother     Social History Social History  Substance Use Topics  . Smoking status: Former Smoker    Types: Pipe  . Smokeless tobacco: Never Used  . Alcohol use No     Allergies   Altace [ramipril]; Amoxicillin; and Cozaar [losartan potassium]   Review of Systems Review of Systems  Unable to perform ROS: Acuity of condition     Physical Exam Updated Vital Signs BP (!) 135/53 (BP Location: Right Arm)   Pulse 62   Temp 97.9 F (36.6 C)   Resp 20   Ht 5\' 8"  (1.727 m)   Wt 65.3 kg (144 lb)   SpO2 95%   BMI 21.90 kg/m   Physical Exam  Constitutional:  Knows name and place. Stated it was Monday.  HENT:  Head: Normocephalic and atraumatic.  Eyes: Conjunctivae are normal.  Neck: Neck supple.  Cardiovascular: Normal rate and regular rhythm.   Pulmonary/Chest: Effort normal and breath sounds normal.  Abdominal: Soft. Bowel sounds are normal.  Musculoskeletal: Normal range of motion.  Neurological: He is alert.  Skin: Skin is warm and dry.  Psychiatric: He has a normal mood and affect.  Nursing note and vitals reviewed.    ED Treatments / Results  Labs (all labs ordered are listed, but only abnormal results are displayed) Labs Reviewed  COMPREHENSIVE METABOLIC PANEL - Abnormal; Notable for the following:       Result Value   Calcium 8.6 (*)    Total Protein  6.0 (*)    Albumin 3.1 (*)    Total Bilirubin 1.3 (*)    All other components within normal limits  URINALYSIS, ROUTINE W REFLEX MICROSCOPIC - Abnormal; Notable for the following:    pH 9.0 (*)    All other components within normal limits  CBC WITH DIFFERENTIAL/PLATELET - Abnormal; Notable for the following:    RBC 3.40 (*)    Hemoglobin 10.8 (*)    HCT 30.9 (*)    All other components within normal limits  TROPONIN I  CBC WITH DIFFERENTIAL/PLATELET  SEDIMENTATION RATE    EKG  EKG Interpretation  Date/Time:  Sunday February 04 2017 08:29:49 EDT Ventricular Rate:  61 PR Interval:    QRS Duration:  101 QT Interval:  426 QTC Calculation: 430 R Axis:   -29 Text Interpretation:  Sinus rhythm Borderline left axis deviation Abnormal R-wave progression, late transition Repol abnrm suggests ischemia, lateral leads Confirmed by Nat Christen [phone]) on 02/04/2017 10:55:57 AM       Radiology Ct Head Wo Contrast  Result Date: 02/04/2017 CLINICAL DATA:  Right temporal headache starting at 7 a.m. EXAM: CT HEAD WITHOUT CONTRAST TECHNIQUE: Contiguous axial images were obtained from the base of the skull through the vertex without intravenous contrast. COMPARISON:  12/21/2014. FINDINGS: Brain: No evidence of acute infarction, hemorrhage, hydrocephalus, extra-axial collection or mass lesion/mass effect. There is advanced generalized atrophy with ventriculomegaly. Chronic small vessel ischemia with confluent periventricular low-density. Vascular: Atherosclerotic calcification.  No hyperdense vessel. Skull: No acute or aggressive finding. Sinuses/Orbits: Cataract resection.  No acute finding. Other: There is a skin lesion along the anterior right temporal scalp measuring 11 mm in diameter and 5 mm in depth. IMPRESSION: 1. No acute finding. 2. Advanced atrophy and chronic small vessel ischemia. 3. Nonspecific skin lesion along the anterior right temporal fossa that is new or progressed from 2016. Please  correlate with skin exam. Electronically Signed   By: Monte Fantasia M.D.   On: 02/04/2017 09:09   Dg Chest Port 1 View  Result Date: 02/04/2017 CLINICAL DATA:  Pt experiencing SOB x1day. Former smoker. Hx of HTN and cancer. EXAM: PORTABLE CHEST 1 VIEW COMPARISON:  04/19/2016 FINDINGS: The heart size and mediastinal contours are within normal limits. Both lungs are clear. No pleural effusion or pneumothorax. The visualized skeletal structures are intact. IMPRESSION: No active disease. Electronically Signed   By: Lajean Manes M.D.   On: 02/04/2017 09:14    Procedures Procedures (including critical care time)  Medications Ordered in ED Medications  atorvastatin (LIPITOR) tablet 20 mg (not administered)  finasteride (PROSCAR) tablet 5 mg (not administered)  rivastigmine (EXELON) capsule 1.5 mg (not administered)  traMADol (ULTRAM) tablet 50 mg (not administered)  tamsulosin (FLOMAX) capsule 0.8 mg (not administered)  levobunolol (BETAGAN) 0.5 % ophthalmic solution 1 drop (not administered)  polyethylene glycol (MIRALAX / GLYCOLAX) packet 17 g (not administered)  aspirin chewable tablet 81 mg (not administered)  amLODipine (NORVASC) tablet 5 mg (not administered)  sodium chloride 0.9 % bolus 500 mL (0 mLs Intravenous Stopped 02/04/17 1035)  sodium chloride 0.9 % bolus 500 mL (0 mLs Intravenous Stopped 02/04/17 1137)     Initial Impression / Assessment and Plan / ED Course  I have reviewed the triage vital signs and the nursing notes.  Pertinent labs & imaging results that were available during my care of the patient were reviewed by me and considered in my medical decision making (see chart for details).     Uncertain etiology of patient's symptom complex. Will admit to general medicine for observation. Discussed with his son at [phone].  Final Clinical Impressions(s) / ED Diagnoses   Final diagnoses:  Vertigo  Chest pain, unspecified type  Right temporal headache    New  Prescriptions New Prescriptions   No medications on file     Nat Christen, MD 02/04/17 1300

## 2017-02-04 NOTE — H&P (Signed)
History and Physical    Lance Kemp CBS:[id number] DOB: 06/16/1923 DOA: 02/04/2017   PCP: [name], MD   Patient coming from: home   Chief Complaint: Headache   HPI: Lance Kemp is a 83 y.o. male with medical history significant of HTN,TIA,SKIN CANCER presents to the ER today after he woke up the morning complaining of right temporal frontal headache with blurry vision AND lightheadedness and vertigo like symptoms he called his daughter and she called EMS , in the ER he was found to be stable CAT scan is negative and all the labs are within normal limits symptoms resolved with the tramadol because of his age and clinical abilities MRI was recommended to rule out any left or stroke test reason for admission.    Review of Systems: As per HPI otherwise 10 point review of systems negative.  Currently negative  Past Medical History:  Diagnosis Date  . Basal cell carcinoma 2011; 2016   Face  . BPH with obstruction/lower urinary tract symptoms   . Chronic back pain   . Chronic diastolic heart failure ([place])   . Chronic left shoulder pain 09/14/2013  . Chronic renal insufficiency, stage III (moderate) 2013   CrCl in the 50s-60s  . Chronic venous insufficiency    compression hose  . 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  No significant residual deficit except short term memory impairment  . Dementia 2016   Dr. Delice Lesch 08/2014--started aricept but pt did not tolerate.  Exelon trial started by Dr. Delice Lesch 05/2016.  Marland Kitchen Depression    antidepressant x 1 yr--situational. Resolved 08/2013 per pt/son.  . Frequent headaches   . Glaucoma   . Hypertension   . Normocytic anemia 2015   secondary to CRI (iron studies ok 12/2013)  . NSTEMI (non-ST elevated myocardial infarction) ([place]) 06/2011   [place]: Troponin mildly elevated, normal EKG, normal ECHO, pt declined cardiology consult--per old PCP records.  Marland Kitchen OAB (overactive bladder)   . Osteoarthritis    hands/wrists  .  Peripheral neuropathy 2013   Dx'd by neuro in [place] at the time the patient was hospitalized briefly for pontine CVA.  Marland Kitchen Shortness of breath   . Urine incontinence     Past Surgical History:  Procedure Laterality Date  . CHOLECYSTECTOMY    . NM VQ LUNG SCAN ([place] HX)  09/2014   Low risk  . TONSILECTOMY, ADENOIDECTOMY, BILATERAL MYRINGOTOMY AND TUBES    . TRANSTHORACIC ECHOCARDIOGRAM  12/18/13   Grade 1 diast dysf, o/w normal     reports that he has quit smoking. His smoking use included Pipe. He has never used smokeless tobacco. He reports that he does not drink alcohol or use drugs.  Allergies  Allergen Reactions  . Altace [Ramipril] Other (See Comments)    Reaction:  Unknown   . Amoxicillin Other (See Comments)    Reaction:  Unknown  Has patient had a PCN reaction causing immediate rash, facial/tongue/throat swelling, SOB or lightheadedness with hypotension: Unsure Has patient had a PCN reaction causing severe rash involving mucus membranes or skin necrosis: Unsure Has patient had a PCN reaction that required hospitalization Unsure Has patient had a PCN reaction occurring within the last 10 years: Unsure If all of the above answers are "NO", then may proceed with Cephalosporin use.  [name] Potassium] Other (See Comments)    Reaction:  Unknown     Family History  Problem Relation Age of Onset  . Cancer Mother   . Heart disease  Mother      Prior to Admission medications   Medication Sig Start Date End Date Taking? Authorizing Provider  aspirin 81 MG chewable tablet Chew 81 mg by mouth daily.   Yes [provider]  atorvastatin (LIPITOR) 20 MG tablet Take 1 tablet (20 mg total) by mouth at bedtime. 01/25/17  Yes McGowen, [name], MD  azelastine (OPTIVAR) 0.05 % ophthalmic solution Place 1 drop into both eyes 2 (two) times daily. 06/21/16  Yes McGowen, [name], MD  fexofenadine (ALLEGRA) 60 MG tablet 1 tab po bid prn itching 08/03/16  Yes McGowen, [name],  MD  finasteride (PROSCAR) 5 MG tablet TAKE ONE TABLET BY MOUTH EVERY DAY 01/25/17  Yes McGowen, [name], MD  furosemide (LASIX) 40 MG tablet 1/2 tab po qd 01/25/17  Yes McGowen, [name], MD  levobunolol (BETAGAN) 0.5 % ophthalmic solution Place 1 drop into both eyes 2 (two) times daily. 06/21/16  Yes McGowen, [name], MD  polyethylene glycol powder (GLYCOLAX/MIRALAX) powder Take 17 g by mouth 2 (two) times daily as needed. 06/21/16  Yes McGowen, [name], MD  rivastigmine (EXELON) 1.5 MG capsule TAKE 1 CAPSULE BY MOUTH TWICE DAILY 12/26/16  Yes Cameron Sprang, MD  tamsulosin (FLOMAX) 0.4 MG CAPS capsule TAKE 2 CAPSULES BY MOUTH AT BEDTIME 09/08/16  Yes McGowen, [name], MD  traMADol (ULTRAM) 50 MG tablet Take 1 tablet (50 mg total) by mouth every 6 (six) hours as needed. 11/01/16  Yes [name], MD    Physical Exam: Vitals:   02/04/17 0830 02/04/17 0937 02/04/17 1030 02/04/17 1156  BP: (!) 154/78  (!) 150/57 (!) 135/53  Pulse: (!) 59  65 62  Resp: 16  (!) 23 20  Temp: (!) 97.5 F (36.4 C) 97.9 F (36.6 C)    TempSrc: Oral     SpO2: 99%  98% 95%  Weight: 65.3 kg (144 lb)     Height: 5\' 8"  (1.727 m)         Constitutional: NAD, calm, comfortable Vitals:   02/04/17 0830 02/04/17 0937 02/04/17 1030 02/04/17 1156  BP: (!) 154/78  (!) 150/57 (!) 135/53  Pulse: (!) 59  65 62  Resp: 16  (!) 23 20  Temp: (!) 97.5 F (36.4 C) 97.9 F (36.6 C)    TempSrc: Oral     SpO2: 99%  98% 95%  Weight: 65.3 kg (144 lb)     Height: 5\' 8"  (1.727 m)      Eyes: PERRL, lids and conjunctivae normal ENMT: Mucous membranes are moist. Posterior pharynx clear of any exudate or lesions.Normal dentition.  Neck: normal, supple, no masses, no thyromegaly Respiratory: clear to auscultation bilaterally, no wheezing, no crackles. Normal respiratory effort. No accessory muscle use.  Cardiovascular: Regular rate and rhythm, no murmurs / rubs / gallops. No extremity edema. 2+ pedal pulses. No carotid bruits.    Abdomen: no tenderness, no masses palpated. No hepatosplenomegaly. Bowel sounds positive.  Musculoskeletal: no clubbing / cyanosis. No joint deformity upper and lower extremities. Good ROM, no contractures. Normal muscle tone.  Skin: no rashes, lesions, ulcers. No induration Neurologic: CN 2-12 grossly intact. Sensation intact, DTR normal. Strength 5/5 in all 4.  Psychiatric: Normal judgment and insight. Alert and oriented x 3. Normal mood.  Labs on Admission: I have personally reviewed following labs and imaging studies  CBC:  Recent Labs Lab 02/04/17 1137  WBC 6.8  NEUTROABS 4.5  HGB 10.8*  HCT 30.9*  MCV 90.9  PLT 086   Basic Metabolic Panel:  Recent Labs Lab 02/04/17 0831  NA 138  K 3.6  CL 107  CO2 22  GLUCOSE 99  BUN 16  CREATININE 0.96  CALCIUM 8.6*   GFR: Estimated Creatinine Clearance: 46.3 mL/min (by C-G formula based on SCr of 0.96 mg/dL). Liver Function Tests:  Recent Labs Lab 02/04/17 0831  AST 28  ALT 22  ALKPHOS 68  BILITOT 1.3*  PROT 6.0*  ALBUMIN 3.1*   No results for input(s): LIPASE, AMYLASE in the last 168 hours. No results for input(s): AMMONIA in the last 168 hours. Coagulation Profile: No results for input(s): INR, PROTIME in the last 168 hours. Cardiac Enzymes:  Recent Labs Lab 02/04/17 0831  TROPONINI <0.03   BNP (last 3 results) No results for input(s): PROBNP in the last 8760 hours. HbA1C: No results for input(s): HGBA1C in the last 72 hours. CBG: No results for input(s): GLUCAP in the last 168 hours. Lipid Profile: No results for input(s): CHOL, HDL, LDLCALC, TRIG, CHOLHDL, LDLDIRECT in the last 72 hours. Thyroid Function Tests: No results for input(s): TSH, T4TOTAL, FREET4, T3FREE, THYROIDAB in the last 72 hours. Anemia Panel: No results for input(s): VITAMINB12, FOLATE, FERRITIN, TIBC, IRON, RETICCTPCT in the last 72 hours. Urine analysis:    Component Value Date/Time   COLORURINE YELLOW 02/04/2017 0831    APPEARANCEUR CLEAR 02/04/2017 0831   LABSPEC 1.010 02/04/2017 0831   PHURINE 9.0 (H) 02/04/2017 0831   GLUCOSEU NEGATIVE 02/04/2017 0831   HGBUR NEGATIVE 02/04/2017 0831   BILIRUBINUR NEGATIVE 02/04/2017 0831   BILIRUBINUR neg 09/24/2015 1504   KETONESUR NEGATIVE 02/04/2017 0831   PROTEINUR NEGATIVE 02/04/2017 0831   UROBILINOGEN 0.2 09/24/2015 1504   UROBILINOGEN 0.2 09/10/2014 1052   NITRITE NEGATIVE 02/04/2017 0831   LEUKOCYTESUR NEGATIVE 02/04/2017 0831   Sepsis Labs: @LABRCNTIP (procalcitonin:4,lacticidven:4) )No results found for this or any previous visit (from the past 240 hour(s)).   Radiological Exams on Admission: Ct Head Wo Contrast  Result Date: 02/04/2017 CLINICAL DATA:  Right temporal headache starting at 7 a.m. EXAM: CT HEAD WITHOUT CONTRAST TECHNIQUE: Contiguous axial images were obtained from the base of the skull through the vertex without intravenous contrast. COMPARISON:  12/21/2014. FINDINGS: Brain: No evidence of acute infarction, hemorrhage, hydrocephalus, extra-axial collection or mass lesion/mass effect. There is advanced generalized atrophy with ventriculomegaly. Chronic small vessel ischemia with confluent periventricular low-density. Vascular: Atherosclerotic calcification.  No hyperdense vessel. Skull: No acute or aggressive finding. Sinuses/Orbits: Cataract resection.  No acute finding. Other: There is a skin lesion along the anterior right temporal scalp measuring 11 mm in diameter and 5 mm in depth. IMPRESSION: 1. No acute finding. 2. Advanced atrophy and chronic small vessel ischemia. 3. Nonspecific skin lesion along the anterior right temporal fossa that is new or progressed from 2016. Please correlate with skin exam. Electronically Signed   By: Monte Fantasia M.D.   On: 02/04/2017 09:09   Dg Chest Port 1 View  Result Date: 02/04/2017 CLINICAL DATA:  Pt experiencing SOB x1day. Former smoker. Hx of HTN and cancer. EXAM: PORTABLE CHEST 1 VIEW COMPARISON:   04/19/2016 FINDINGS: The heart size and mediastinal contours are within normal limits. Both lungs are clear. No pleural effusion or pneumothorax. The visualized skeletal structures are intact. IMPRESSION: No active disease. Electronically Signed   By: Lajean Manes M.D.   On: 02/04/2017 09:14    EKG: Independently reviewed.   Assessment/Plan   1-headache with vertigo possible lacunar stroke in elderly male we  are going to attempt observation with MRI of the brain to be done if it's negative we can do physical therapy and evaluate for discharge home. 2-hypertension : gentle controlled with Norvasc 5 mg daily .    DVT prophylaxis: Lovenox Code Status: FULL  Family Communication:NONE Disposition Plan: HOME  Consults called:NONE Admission status:OBS    Waldron Session MD [facility]   If 7PM-7AM, please contact night-coverage [email or web address] Password [facility]  02/04/2017, 12:46 PM

## 2017-02-05 ENCOUNTER — Observation Stay (HOSPITAL_COMMUNITY): Payer: Medicare Other

## 2017-02-05 DIAGNOSIS — R42 Dizziness and giddiness: Secondary | ICD-10-CM | POA: Diagnosis not present

## 2017-02-05 DIAGNOSIS — R51 Headache: Secondary | ICD-10-CM

## 2017-02-05 NOTE — Care Management Note (Signed)
Case Management Note  Patient Details  Name: Lance Kemp MRN: [id number] Date of Birth: 1922-10-26  Subjective/Objective:                  Admitted r/o CVA. Pt is from home, lives in his own home, family lives across street and are very active. Pt ind with ADL's. Uses RW with ambulation. Pt was recently DC'd from [facility] services through [facility]. Per son PCP told pt to hold off on PT due to neuropathy. PT has been recommended at DC. discussed with pt son and they ask to give [facility] the referral but tell them to hold referral until son/pt discusses further with PCP on Friday. No needs communicated by pt/son.   Action/Plan: DC home today. Brad, [facility] rep, aware of referral and will obtain pt info from chart.   Expected Discharge Date:  02/05/17               Expected Discharge Plan:  Home/Self Care  In-House Referral:  NA  Discharge planning Services  CM Consult  Post Acute Care Choice:  NA Choice offered to:  NA  Status of Service:  Completed, signed off  Sherald Barge, RN 02/05/2017, 11:23 AM

## 2017-02-05 NOTE — Progress Notes (Signed)
Pt discharged home today per Dr. Burnis Medin. Pt's IV site D/C'd and WDL. Pt's VSS. Pt provided with home medication list, discharge instructions and prescriptions. Verbalized understanding. Pt left floor via WC in stable condition accompanied by NT.

## 2017-02-05 NOTE — Care Management Obs Status (Signed)
[place] NOTIFICATION   Patient Details  Name: Kayon Dozier MRN: [id number] Date of Birth: December 18, 1922   Medicare Observation Status Notification Given:  Other (see comment) (DC <24hrs)    Sherald Barge, RN 02/05/2017, 11:16 AM

## 2017-02-05 NOTE — Discharge Summary (Signed)
Physician Discharge Summary  Lance Kemp NFA:[id number] DOB: Aug 15, 1922 DOA: 02/04/2017  PCP: [name], MD  Admit date: 02/04/2017 Discharge date: 02/05/2017  Admitted From: Home DispositionHome   Recommendations for Outpatient Follow-up:  1. Follow up with PCP in 1-2 weeks   Home Health:YES Equipment/Devices:no  Discharge Condition:Stable,  Diet recommendation: Heart Healthy   Brief/Interim Summary:   83 y.o. male with medical history significant of HTN,TIA,SKIN CANCER presents to the ER today after he woke up the morning complaining of right temporal frontal headache with blurry vision AND lightheadedness and vertigo like symptoms he called his daughter and she called EMS , in the ER he was found to be stable CAT scan is negative and all the labs are within normal limits symptoms resolved with the tramadol because of his age and clinical abilities MRI was recommended , I admitted him for obs and with his improvement and age I recommended ASA only to be given and continue home therapy .  Discharge Diagnoses:  Active Problems:   Vertigo    Discharge Instructions  Discharge Instructions    Diet - low sodium heart healthy    Complete by:  As directed    Increase activity slowly    Complete by:  As directed      Allergies as of 02/05/2017      Reactions   Altace [ramipril] Other (See Comments)   Reaction:  Unknown    Amoxicillin Other (See Comments)   Reaction:  Unknown  Has patient had a PCN reaction causing immediate rash, facial/tongue/throat swelling, SOB or lightheadedness with hypotension: Unsure Has patient had a PCN reaction causing severe rash involving mucus membranes or skin necrosis: Unsure Has patient had a PCN reaction that required hospitalization Unsure Has patient had a PCN reaction occurring within the last 10 years: Unsure If all of the above answers are "NO", then may proceed with Cephalosporin use.   Cozaar [losartan Potassium] Other (See  Comments)   Reaction:  Unknown    Tramadol Other (See Comments)   Makes patient feel very drowsy/almost too strong to take      Medication List    STOP taking these medications   furosemide 40 MG tablet Commonly known as:  LASIX     TAKE these medications   aspirin 81 MG chewable tablet Chew 81 mg by mouth daily.   atorvastatin 20 MG tablet Commonly known as:  LIPITOR Take 1 tablet (20 mg total) by mouth at bedtime.   azelastine 0.05 % ophthalmic solution Commonly known as:  OPTIVAR Place 1 drop into both eyes 2 (two) times daily.   finasteride 5 MG tablet Commonly known as:  PROSCAR TAKE ONE TABLET BY MOUTH EVERY DAY   hydrALAZINE 10 MG tablet Commonly known as:  APRESOLINE Take 10 mg by mouth 3 (three) times daily.   ICAPS AREDS 2 PO Take 1 capsule by mouth 2 (two) times daily.   levobunolol 0.5 % ophthalmic solution Commonly known as:  BETAGAN Place 1 drop into both eyes 2 (two) times daily.   MYRBETRIQ 25 MG Tb24 tablet Generic drug:  mirabegron ER Take 25 mg by mouth daily.   polyethylene glycol powder powder Commonly known as:  GLYCOLAX/MIRALAX Take 17 g by mouth 2 (two) times daily as needed.   rivastigmine 1.5 MG capsule Commonly known as:  EXELON TAKE 1 CAPSULE BY MOUTH TWICE DAILY   tamsulosin 0.4 MG Caps capsule Commonly known as:  FLOMAX TAKE 2 CAPSULES BY MOUTH AT BEDTIME  Allergies  Allergen Reactions  . Altace [Ramipril] Other (See Comments)    Reaction:  Unknown   . Amoxicillin Other (See Comments)    Reaction:  Unknown  Has patient had a PCN reaction causing immediate rash, facial/tongue/throat swelling, SOB or lightheadedness with hypotension: Unsure Has patient had a PCN reaction causing severe rash involving mucus membranes or skin necrosis: Unsure Has patient had a PCN reaction that required hospitalization Unsure Has patient had a PCN reaction occurring within the last 10 years: Unsure If all of the above answers are  "NO", then may proceed with Cephalosporin use.  [name] Potassium] Other (See Comments)    Reaction:  Unknown   . Tramadol Other (See Comments)    Makes patient feel very drowsy/almost too strong to take    Consultations:  None     Procedures/Studies: Ct Head Wo Contrast  Result Date: 02/04/2017 CLINICAL DATA:  Right temporal headache starting at 7 a.m. EXAM: CT HEAD WITHOUT CONTRAST TECHNIQUE: Contiguous axial images were obtained from the base of the skull through the vertex without intravenous contrast. COMPARISON:  12/21/2014. FINDINGS: Brain: No evidence of acute infarction, hemorrhage, hydrocephalus, extra-axial collection or mass lesion/mass effect. There is advanced generalized atrophy with ventriculomegaly. Chronic small vessel ischemia with confluent periventricular low-density. Vascular: Atherosclerotic calcification.  No hyperdense vessel. Skull: No acute or aggressive finding. Sinuses/Orbits: Cataract resection.  No acute finding. Other: There is a skin lesion along the anterior right temporal scalp measuring 11 mm in diameter and 5 mm in depth. IMPRESSION: 1. No acute finding. 2. Advanced atrophy and chronic small vessel ischemia. 3. Nonspecific skin lesion along the anterior right temporal fossa that is new or progressed from 2016. Please correlate with skin exam. Electronically Signed   By: Monte Fantasia M.D.   On: 02/04/2017 09:09   Dg Chest Port 1 View  Result Date: 02/04/2017 CLINICAL DATA:  Pt experiencing SOB x1day. Former smoker. Hx of HTN and cancer. EXAM: PORTABLE CHEST 1 VIEW COMPARISON:  04/19/2016 FINDINGS: The heart size and mediastinal contours are within normal limits. Both lungs are clear. No pleural effusion or pneumothorax. The visualized skeletal structures are intact. IMPRESSION: No active disease. Electronically Signed   By: Lajean Manes M.D.   On: 02/04/2017 09:14    (Echo, Carotid, EGD, Colonoscopy, ERCP)    Subjective:   Discharge  Exam: Vitals:   02/05/17 0500 02/05/17 1053  BP: (!) 148/67   Pulse: (!) 55 63  Resp: 16   Temp: (!) 97.5 F (36.4 C)   SpO2: 99%    Vitals:   02/04/17 1837 02/04/17 2136 02/05/17 0500 02/05/17 1053  BP: (!) 124/56 (!) 142/52 (!) 148/67   Pulse: 61 63 (!) 55 63  Resp: 16 16 16    Temp: 97.9 F (36.6 C) 98.4 F (36.9 C) (!) 97.5 F (36.4 C)   TempSrc: Oral Oral Oral   SpO2: 97% 98% 99%   Weight: 64.9 kg (143 lb)     Height: 5\' 8"  (1.727 m)       General: Pt is alert, awake, not in acute distress Cardiovascular: RRR, S1/S2 +, no rubs, no gallops Respiratory: CTA bilaterally, no wheezing, no rhonchi Abdominal: Soft, NT, ND, bowel sounds + Extremities: no edema, no cyanosis    The results of significant diagnostics from this hospitalization (including imaging, microbiology, ancillary and laboratory) are listed below for reference.     Microbiology: No results found for this or any previous visit (from the past 240 hour(s)).  Labs: BNP (last 3 results)  Recent Labs  04/19/16 0706  BNP 82.9   Basic Metabolic Panel:  Recent Labs Lab 02/04/17 0831  NA 138  K 3.6  CL 107  CO2 22  GLUCOSE 99  BUN 16  CREATININE 0.96  CALCIUM 8.6*   Liver Function Tests:  Recent Labs Lab 02/04/17 0831  AST 28  ALT 22  ALKPHOS 68  BILITOT 1.3*  PROT 6.0*  ALBUMIN 3.1*   No results for input(s): LIPASE, AMYLASE in the last 168 hours. No results for input(s): AMMONIA in the last 168 hours. CBC:  Recent Labs Lab 02/04/17 1137  WBC 6.8  NEUTROABS 4.5  HGB 10.8*  HCT 30.9*  MCV 90.9  PLT 170   Cardiac Enzymes:  Recent Labs Lab 02/04/17 0831  TROPONINI <0.03   BNP: Invalid input(s): POCBNP CBG: No results for input(s): GLUCAP in the last 168 hours. D-Dimer No results for input(s): DDIMER in the last 72 hours. Hgb A1c No results for input(s): HGBA1C in the last 72 hours. Lipid Profile No results for input(s): CHOL, HDL, LDLCALC, TRIG, CHOLHDL,  LDLDIRECT in the last 72 hours. Thyroid function studies No results for input(s): TSH, T4TOTAL, T3FREE, THYROIDAB in the last 72 hours.  Invalid input(s): FREET3 Anemia work up No results for input(s): VITAMINB12, FOLATE, FERRITIN, TIBC, IRON, RETICCTPCT in the last 72 hours. Urinalysis    Component Value Date/Time   COLORURINE YELLOW 02/04/2017 0831   APPEARANCEUR CLEAR 02/04/2017 0831   LABSPEC 1.010 02/04/2017 0831   PHURINE 9.0 (H) 02/04/2017 0831   GLUCOSEU NEGATIVE 02/04/2017 0831   HGBUR NEGATIVE 02/04/2017 0831   BILIRUBINUR NEGATIVE 02/04/2017 0831   BILIRUBINUR neg 09/24/2015 1504   KETONESUR NEGATIVE 02/04/2017 0831   PROTEINUR NEGATIVE 02/04/2017 0831   UROBILINOGEN 0.2 09/24/2015 1504   UROBILINOGEN 0.2 09/10/2014 1052   NITRITE NEGATIVE 02/04/2017 0831   LEUKOCYTESUR NEGATIVE 02/04/2017 0831   Sepsis Labs Invalid input(s): PROCALCITONIN,  WBC,  LACTICIDVEN Microbiology No results found for this or any previous visit (from the past 240 hour(s)).   Time coordinating discharge: Over 30 minutes  SIGNED:   Waldron Session, MD  [facility] 02/05/2017, 10:57 AM Pager   If 7PM-7AM, please contact night-coverage [email or web address] Password [email or web address]

## 2017-02-05 NOTE — Evaluation (Signed)
Physical Therapy Evaluation Patient Details Name: Lance Kemp MRN: [id number] DOB: 1923/04/05 Today's Date: 02/05/2017   History of Present Illness  Lance Kemp is a 83yo white male who comes from [facility] from home due to YHA, dizziness, and visual disturbance. PMH: HTN, TIA, skin CA, CVA, NSTEMI, peripheral neuropathy, chronic low back pain. Pt lives alone, family nearby who assist with tranport, IADL, and housework. Aide 1x/week with bathing. Pt reports hsi balance has progressively worsened over the past several month.   Clinical Impression  Pt admitted with above diagnosis. Pt currently with functional limitations due to the deficits listed below (see "PT Problem List"). Upon entry, the patient is received semirecumbent in bed, no family/caregiver present. The pt is awake and agreeable to participate. No acute distress noted at this time, reporting his HA and dizziness are resolved. The pt is alert and oriented x3, pleasant, conversational, and following simple and multi-step commands consistently. Manual muscle testing screening reveals 5/5 strength grossly, symmetrical, and subjectively unchanged to baseline: grips are strong. Functional mobility assessment demonstrates heavy balance impairment (consistent with chronic PND) moderate strength impairment in BLE, the pt now requiring mod-max effort for bed mobility and transfers, whereas the patient performed these at a higher level of independence PTA. He is dependent on RW for balance as he is at baseline. Empirically, the patient demonstrates increased risk of recurrent falls AEB gait speed <0.28m/s, forward reach <5", and multiple LOB demonstrated throughout session. Pt denies visual disturbance. Pt will benefit from skilled PT intervention to increase independence and safety with basic mobility in preparation for discharge to the venue listed below.       Follow Up Recommendations Home health PT    Equipment Recommendations  None recommended by  PT    Recommendations for Other Services       Precautions / Restrictions Precautions Precautions: Fall Restrictions Weight Bearing Restrictions: No Other Position/Activity Restrictions: careful with RUE due to chronic soreness.       Mobility  Bed Mobility Overal bed mobility: Modified Independent                Transfers Overall transfer level: Modified independent Equipment used: Rolling walker (2 wheeled) Transfers: Sit to/from Stand Sit to Stand: Modified independent (Device/Increase time)         General transfer comment: heavy effort required.   Ambulation/Gait Ambulation/Gait assistance: Supervision Ambulation Distance (Feet): 130 Feet Assistive device: Rolling walker (2 wheeled) Gait Pattern/deviations: Step-to pattern Gait velocity: 0.88m/s  Gait velocity interpretation: <1.8 ft/sec, indicative of risk for recurrent falls General Gait Details: pauses periodically to rest   Stairs            Wheelchair Mobility    Modified Rankin (Stroke Patients Only)       Balance Overall balance assessment: History of Falls;Modified Independent                                           Pertinent Vitals/Pain Pain Assessment: No/denies pain    Home Living Family/patient expects to be discharged to:: Private residence Living Arrangements: Alone Available Help at Discharge: Family (Son/DIL are accross the street ) Type of Home: House Home Access: Ramped entrance     Home Layout: One level Home Equipment: Environmental consultant - 2 wheels;Shower seat - built in      Prior Function Level of Independence: Needs assistance   Gait / Transfers Assistance  Needed: household distances with RW d/t balance deficits, aide assists with bathing.   ADL's / Homemaking Assistance Needed: ModI for dressing and toiletting.   Comments: 1 fall in three months in driveway      Hand Dominance   Dominant Hand: Right    Extremity/Trunk Assessment   Upper  Extremity Assessment Upper Extremity Assessment:  (grips equla symetrical, BUE grossly 5/5 )    Lower Extremity Assessment Lower Extremity Assessment:  (BLE 5/5. )    Cervical / Trunk Assessment Cervical / Trunk Assessment: Kyphotic  Communication   Communication: No difficulties  Cognition Arousal/Alertness: Awake/alert Behavior During Therapy: WFL for tasks assessed/performed Overall Cognitive Status: Within Functional Limits for tasks assessed                                        General Comments      Exercises     Assessment/Plan    PT Assessment Patient needs continued PT services  PT Problem List Decreased strength;Decreased balance;Decreased mobility       PT Treatment Interventions Therapeutic activities;Therapeutic exercise;Balance training;Functional mobility training;Patient/family education    PT Goals (Current goals can be found in the Care Plan section)  Acute Rehab PT Goals Patient Stated Goal: improve balance, regain strength  PT Goal Formulation: With patient Time For Goal Achievement: 02/19/17 Potential to Achieve Goals: Good    Frequency Min 2X/week   Barriers to discharge        Co-evaluation               AM-PAC PT "6 Clicks" Daily Activity  Outcome Measure Difficulty turning over in bed (including adjusting bedclothes, sheets and blankets)?: A Little Difficulty moving from lying on back to sitting on the side of the bed? : A Lot Difficulty sitting down on and standing up from a chair with arms (e.g., wheelchair, bedside commode, etc,.)?: A Lot Help needed moving to and from a bed to chair (including a wheelchair)?: A Little Help needed walking in hospital room?: A Little Help needed climbing 3-5 steps with a railing? : A Lot 6 Click Score: 15    End of Session Equipment Utilized During Treatment: Gait belt Activity Tolerance: Patient tolerated treatment well;Patient limited by fatigue Patient left: in bed;with  call bell/phone within reach;with bed alarm set Nurse Communication: Mobility status PT Visit Diagnosis: Unsteadiness on feet (R26.81);Muscle weakness (generalized) (M62.81)    Time: 0867-6195 PT Time Calculation (min) (ACUTE ONLY): 27 min   Charges:   PT Evaluation $PT Eval Moderate Complexity: 1 Mod PT Treatments $Therapeutic Activity: 8-22 mins   PT G Codes:   PT G-Codes **NOT FOR INPATIENT CLASS** Functional Assessment Tool Used: AM-PAC 6 Clicks Basic Mobility Functional Limitation: Mobility: Walking and moving around Mobility: Walking and Moving Around Current Status (K9326): At least 40 percent but less than 60 percent impaired, limited or restricted Mobility: Walking and Moving Around Goal Status [phone]): At least 40 percent but less than 60 percent impaired, limited or restricted    11:05 AM, 02/05/17 Etta Grandchild, PT, DPT Physical Therapist - [place] [phone] (Office)    Joh Rao C 02/05/2017, 11:02 AM

## 2017-02-06 ENCOUNTER — Telehealth: Payer: Self-pay

## 2017-02-06 NOTE — Telephone Encounter (Signed)
LM (on son's voicemail) requesting call back to schedule hosp f/u and complete TCM.

## 2017-02-07 NOTE — Telephone Encounter (Signed)
LM requesting patient son Gerald Stabs) to return call to complete TCM and schedule hospital f/u appt.

## 2017-02-08 NOTE — Telephone Encounter (Signed)
3rd attempt at reaching patient to complete TCM and schedule hosp f/u.

## 2017-02-11 ENCOUNTER — Observation Stay (HOSPITAL_COMMUNITY)
Admission: EM | Admit: 2017-02-11 | Discharge: 2017-02-12 | Disposition: A | Discharge status: Home / Self Care | Payer: Medicare Other | Attending: Internal Medicine | Admitting: Internal Medicine

## 2017-02-11 ENCOUNTER — Emergency Department (HOSPITAL_COMMUNITY): Payer: Medicare Other

## 2017-02-11 ENCOUNTER — Encounter (HOSPITAL_COMMUNITY): Payer: Self-pay | Admitting: Emergency Medicine

## 2017-02-11 DIAGNOSIS — R351 Nocturia: Secondary | ICD-10-CM

## 2017-02-11 DIAGNOSIS — R079 Chest pain, unspecified: Secondary | ICD-10-CM | POA: Diagnosis not present

## 2017-02-11 DIAGNOSIS — R748 Abnormal levels of other serum enzymes: Secondary | ICD-10-CM

## 2017-02-11 DIAGNOSIS — G8194 Hemiplegia, unspecified affecting left nondominant side: Secondary | ICD-10-CM

## 2017-02-11 DIAGNOSIS — D649 Anemia, unspecified: Secondary | ICD-10-CM | POA: Diagnosis present

## 2017-02-11 DIAGNOSIS — I5032 Chronic diastolic (congestive) heart failure: Secondary | ICD-10-CM | POA: Insufficient documentation

## 2017-02-11 DIAGNOSIS — Z87891 Personal history of nicotine dependence: Secondary | ICD-10-CM | POA: Insufficient documentation

## 2017-02-11 DIAGNOSIS — I10 Essential (primary) hypertension: Secondary | ICD-10-CM | POA: Diagnosis present

## 2017-02-11 DIAGNOSIS — N401 Enlarged prostate with lower urinary tract symptoms: Secondary | ICD-10-CM | POA: Diagnosis not present

## 2017-02-11 DIAGNOSIS — R531 Weakness: Secondary | ICD-10-CM

## 2017-02-11 DIAGNOSIS — Z79899 Other long term (current) drug therapy: Secondary | ICD-10-CM | POA: Diagnosis not present

## 2017-02-11 DIAGNOSIS — N183 Chronic kidney disease, stage 3 unspecified: Secondary | ICD-10-CM | POA: Diagnosis present

## 2017-02-11 DIAGNOSIS — I129 Hypertensive chronic kidney disease with stage 1 through stage 4 chronic kidney disease, or unspecified chronic kidney disease: Secondary | ICD-10-CM | POA: Diagnosis not present

## 2017-02-11 DIAGNOSIS — M542 Cervicalgia: Secondary | ICD-10-CM | POA: Diagnosis not present

## 2017-02-11 DIAGNOSIS — I252 Old myocardial infarction: Secondary | ICD-10-CM | POA: Insufficient documentation

## 2017-02-11 DIAGNOSIS — I951 Orthostatic hypotension: Secondary | ICD-10-CM | POA: Diagnosis not present

## 2017-02-11 DIAGNOSIS — Z8673 Personal history of transient ischemic attack (TIA), and cerebral infarction without residual deficits: Secondary | ICD-10-CM | POA: Insufficient documentation

## 2017-02-11 DIAGNOSIS — E785 Hyperlipidemia, unspecified: Secondary | ICD-10-CM | POA: Diagnosis present

## 2017-02-11 DIAGNOSIS — M6281 Muscle weakness (generalized): Principal | ICD-10-CM | POA: Insufficient documentation

## 2017-02-11 DIAGNOSIS — Y92009 Unspecified place in unspecified non-institutional (private) residence as the place of occurrence of the external cause: Secondary | ICD-10-CM

## 2017-02-11 DIAGNOSIS — I110 Hypertensive heart disease with heart failure: Secondary | ICD-10-CM | POA: Insufficient documentation

## 2017-02-11 DIAGNOSIS — R404 Transient alteration of awareness: Secondary | ICD-10-CM | POA: Diagnosis not present

## 2017-02-11 DIAGNOSIS — S0990XA Unspecified injury of head, initial encounter: Secondary | ICD-10-CM | POA: Diagnosis not present

## 2017-02-11 DIAGNOSIS — D509 Iron deficiency anemia, unspecified: Secondary | ICD-10-CM | POA: Insufficient documentation

## 2017-02-11 DIAGNOSIS — R061 Stridor: Secondary | ICD-10-CM | POA: Diagnosis not present

## 2017-02-11 DIAGNOSIS — S199XXA Unspecified injury of neck, initial encounter: Secondary | ICD-10-CM | POA: Diagnosis not present

## 2017-02-11 DIAGNOSIS — Z7982 Long term (current) use of aspirin: Secondary | ICD-10-CM | POA: Insufficient documentation

## 2017-02-11 DIAGNOSIS — F03A0 Unspecified dementia, mild, without behavioral disturbance, psychotic disturbance, mood disturbance, and anxiety: Secondary | ICD-10-CM | POA: Diagnosis present

## 2017-02-11 DIAGNOSIS — R7989 Other specified abnormal findings of blood chemistry: Secondary | ICD-10-CM | POA: Diagnosis not present

## 2017-02-11 DIAGNOSIS — R110 Nausea: Secondary | ICD-10-CM | POA: Diagnosis not present

## 2017-02-11 DIAGNOSIS — W19XXXA Unspecified fall, initial encounter: Secondary | ICD-10-CM

## 2017-02-11 DIAGNOSIS — R778 Other specified abnormalities of plasma proteins: Secondary | ICD-10-CM | POA: Diagnosis present

## 2017-02-11 DIAGNOSIS — F0390 Unspecified dementia without behavioral disturbance: Secondary | ICD-10-CM | POA: Diagnosis not present

## 2017-02-11 DIAGNOSIS — R51 Headache: Secondary | ICD-10-CM | POA: Diagnosis not present

## 2017-02-11 DIAGNOSIS — G4489 Other headache syndrome: Secondary | ICD-10-CM | POA: Diagnosis not present

## 2017-02-11 HISTORY — DX: Dizziness and giddiness: R42

## 2017-02-11 LAB — COMPREHENSIVE METABOLIC PANEL
ALT: 20 U/L (ref 17–63)
ANION GAP: 5 (ref 5–15)
AST: 25 U/L (ref 15–41)
Albumin: 3.1 g/dL — ABNORMAL LOW (ref 3.5–5.0)
Alkaline Phosphatase: 69 U/L (ref 38–126)
BUN: 15 mg/dL (ref 6–20)
CO2: 26 mmol/L (ref 22–32)
Calcium: 8.5 mg/dL — ABNORMAL LOW (ref 8.9–10.3)
Chloride: 108 mmol/L (ref 101–111)
Creatinine, Ser: 0.96 mg/dL (ref 0.61–1.24)
GLUCOSE: 104 mg/dL — AB (ref 65–99)
POTASSIUM: 3.8 mmol/L (ref 3.5–5.1)
Sodium: 139 mmol/L (ref 135–145)
TOTAL PROTEIN: 5.5 g/dL — AB (ref 6.5–8.1)
Total Bilirubin: 0.8 mg/dL (ref 0.3–1.2)

## 2017-02-11 LAB — URINALYSIS, ROUTINE W REFLEX MICROSCOPIC
BILIRUBIN URINE: NEGATIVE
GLUCOSE, UA: NEGATIVE mg/dL
HGB URINE DIPSTICK: NEGATIVE
KETONES UR: NEGATIVE mg/dL
LEUKOCYTES UA: NEGATIVE
Nitrite: NEGATIVE
PROTEIN: NEGATIVE mg/dL
Specific Gravity, Urine: 1.015 (ref 1.005–1.030)
pH: 8 (ref 5.0–8.0)

## 2017-02-11 LAB — CBC WITH DIFFERENTIAL/PLATELET
Basophils Absolute: 0 10*3/uL (ref 0.0–0.1)
Basophils Relative: 0 %
Eosinophils Absolute: 0.3 10*3/uL (ref 0.0–0.7)
Eosinophils Relative: 4 %
HCT: 30.5 % — ABNORMAL LOW (ref 39.0–52.0)
HEMOGLOBIN: 10.6 g/dL — AB (ref 13.0–17.0)
LYMPHS ABS: 1.8 10*3/uL (ref 0.7–4.0)
LYMPHS PCT: 24 %
MCH: 31.6 pg (ref 26.0–34.0)
MCHC: 34.8 g/dL (ref 30.0–36.0)
MCV: 91 fL (ref 78.0–100.0)
Monocytes Absolute: 0.9 10*3/uL (ref 0.1–1.0)
Monocytes Relative: 12 %
NEUTROS PCT: 60 %
Neutro Abs: 4.6 10*3/uL (ref 1.7–7.7)
Platelets: 184 10*3/uL (ref 150–400)
RBC: 3.35 MIL/uL — AB (ref 4.22–5.81)
RDW: 13.6 % (ref 11.5–15.5)
WBC: 7.6 10*3/uL (ref 4.0–10.5)

## 2017-02-11 LAB — BRAIN NATRIURETIC PEPTIDE: B Natriuretic Peptide: 194 pg/mL — ABNORMAL HIGH (ref 0.0–100.0)

## 2017-02-11 LAB — LACTIC ACID, PLASMA: Lactic Acid, Venous: 1.3 mmol/L (ref 0.5–1.9)

## 2017-02-11 LAB — TROPONIN I: Troponin I: 0.08 ng/mL (ref <0.03)

## 2017-02-11 MED ORDER — RIVASTIGMINE TARTRATE 1.5 MG PO CAPS
1.5000 mg | ORAL_CAPSULE | Freq: Two times a day (BID) | ORAL | Status: DC
  Administered 2017-02-11 – 2017-02-12 (×2): 1.5 mg via ORAL
  Filled 2017-02-11 (×7): qty 1

## 2017-02-11 MED ORDER — KETOTIFEN FUMARATE 0.025 % OP SOLN
1.0000 [drp] | Freq: Two times a day (BID) | OPHTHALMIC | Status: DC
  Administered 2017-02-12: 1 [drp] via OPHTHALMIC
  Filled 2017-02-11: qty 5

## 2017-02-11 MED ORDER — POLYETHYLENE GLYCOL 3350 17 GM/SCOOP PO POWD
17.0000 g | Freq: Two times a day (BID) | ORAL | Status: DC | PRN
  Filled 2017-02-11: qty 255

## 2017-02-11 MED ORDER — ASPIRIN 81 MG PO CHEW
81.0000 mg | CHEWABLE_TABLET | Freq: Every day | ORAL | Status: DC
  Administered 2017-02-12: 81 mg via ORAL
  Filled 2017-02-11: qty 1

## 2017-02-11 MED ORDER — HYDRALAZINE HCL 10 MG PO TABS
10.0000 mg | ORAL_TABLET | Freq: Three times a day (TID) | ORAL | Status: DC
  Administered 2017-02-11 – 2017-02-12 (×3): 10 mg via ORAL
  Filled 2017-02-11 (×3): qty 1

## 2017-02-11 MED ORDER — FINASTERIDE 5 MG PO TABS
5.0000 mg | ORAL_TABLET | Freq: Every day | ORAL | Status: DC
  Administered 2017-02-12: 5 mg via ORAL
  Filled 2017-02-11 (×3): qty 1

## 2017-02-11 MED ORDER — ENOXAPARIN SODIUM 40 MG/0.4ML ~~LOC~~ SOLN
40.0000 mg | SUBCUTANEOUS | Status: DC
  Administered 2017-02-12: 40 mg via SUBCUTANEOUS
  Filled 2017-02-11: qty 0.4

## 2017-02-11 MED ORDER — ATORVASTATIN CALCIUM 20 MG PO TABS
20.0000 mg | ORAL_TABLET | Freq: Every day | ORAL | Status: DC
  Administered 2017-02-11: 20 mg via ORAL
  Filled 2017-02-11: qty 1

## 2017-02-11 MED ORDER — LEVOBUNOLOL HCL 0.5 % OP SOLN
1.0000 [drp] | Freq: Two times a day (BID) | OPHTHALMIC | Status: DC
  Administered 2017-02-12: 1 [drp] via OPHTHALMIC
  Filled 2017-02-11: qty 5

## 2017-02-11 MED ORDER — MIRABEGRON ER 25 MG PO TB24
25.0000 mg | ORAL_TABLET | Freq: Every day | ORAL | Status: DC
  Administered 2017-02-12: 25 mg via ORAL
  Filled 2017-02-11: qty 1

## 2017-02-11 MED ORDER — TAMSULOSIN HCL 0.4 MG PO CAPS
0.8000 mg | ORAL_CAPSULE | Freq: Every day | ORAL | Status: DC
  Administered 2017-02-11: 0.8 mg via ORAL
  Filled 2017-02-11: qty 2

## 2017-02-11 MED ORDER — ASPIRIN 81 MG PO CHEW
324.0000 mg | CHEWABLE_TABLET | Freq: Once | ORAL | Status: AC
  Administered 2017-02-11: 324 mg via ORAL
  Filled 2017-02-11: qty 4

## 2017-02-11 NOTE — ED Notes (Signed)
Fell at home and was initially alert and moving Then became increasingly short of breath and complained of chest pain (points to LLQ as pain site) Nursing staff initially cared for pt, upon leaving pt Mandy, RN told pt that she was leaving and this reporter would care for pt- He replied, "that old lady?"  Pt is alert and conversant

## 2017-02-11 NOTE — ED Triage Notes (Signed)
Pt states he fell at home getting out of his chair.  Began having sudden chest pain on left side and in upper left abd.  Began developing respiratory difficulty on way to ED.  Given Nitro x1 sl with resulting hypotension by ems.  Given "small fluid bolus" with improvement per ems.

## 2017-02-11 NOTE — ED Notes (Signed)
Pt to CT and radiology

## 2017-02-11 NOTE — ED Provider Notes (Signed)
[facility] DEPT Provider Note   CSN: [id number] Arrival date & time: 02/11/17  1846     History   Chief Complaint Chief Complaint  Patient presents with  . Chest Pain  . Fall    HPI Davied Nocito is a 83 y.o. male.  The history is provided by the patient and the EMS personnel. The history is limited by the condition of the patient (Hx dementia).  Chest Pain    Fall  Associated symptoms include chest pain.  Pt was seen at [facility]. Per EMS and pt report: Pt states he got out of his chair, felt weak, then fell. States he could not get up due to generalized weakness. EMS states en route, pt began to c/o left sided CP and became SOB. SL ntg x1 given with resultant hypotension. EMS gave "a small fluid bolus" with improvement. On arrival to ED, pt awake/alert, pleasantly conversive. States he "feels better now." Denies abd pain, no N/V/D, no palpitations, no cough, no focal motor weakness, no tingling/numbness in extremities.    Past Medical History:  Diagnosis Date  . Basal cell carcinoma 2011; 2016   Face  . BPH with obstruction/lower urinary tract symptoms   . Chronic back pain   . Chronic diastolic heart failure ([place])   . Chronic left shoulder pain 09/14/2013  . Chronic renal insufficiency, stage III (moderate) 2013   CrCl in the 50s-60s  . Chronic venous insufficiency    compression hose  . 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  No significant residual deficit except short term memory impairment  . Dementia 2016   Dr. Delice Lesch 08/2014--started aricept but pt did not tolerate.  Exelon trial started by Dr. Delice Lesch 05/2016.  Marland Kitchen Depression    antidepressant x 1 yr--situational. Resolved 08/2013 per pt/son.  . Frequent headaches   . Glaucoma   . Hypertension   . Normocytic anemia 2015   secondary to CRI (iron studies ok 12/2013)  . NSTEMI (non-ST elevated myocardial infarction) ([place]) 06/2011   [place]: Troponin mildly elevated, normal EKG, normal ECHO, pt declined  cardiology consult--per old PCP records.  Marland Kitchen OAB (overactive bladder)   . Osteoarthritis    hands/wrists  . Peripheral neuropathy 2013   Dx'd by neuro in [place] at the time the patient was hospitalized briefly for pontine CVA.  Marland Kitchen Shortness of breath   . Urine incontinence   . Vertigo     Patient Active Problem List   Diagnosis Date Noted  . Vertigo 02/04/2017  . Cellulitis of left lower extremity 04/25/2016  . Cellulitis 04/25/2016  . Basal cell carcinoma, face 04/07/2015  . Mild dementia 11/24/2014  . OAB (overactive bladder) 10/17/2014  . Essential hypertension 08/25/2014  . Hyperlipidemia 08/25/2014  . Peripheral edema 04/16/2014  . Chronic diastolic heart failure ([place]) 03/04/2014  . Chronic renal insufficiency, stage III (moderate)   . Chronic shoulder pain 02/19/2014  . SOB (shortness of breath) 02/18/2014  . Acute on chronic diastolic heart failure ([place]) 12/24/2013  . Lumbago 12/18/2013  . History of CVA (cerebrovascular accident) 11/21/2013  . BPH associated with nocturia 11/21/2013  . Short-term memory loss 10/24/2013  . Chronic left shoulder pain 09/14/2013  . Venous insufficiency of both lower extremities 09/14/2013  . HTN (hypertension), benign 09/14/2013    Past Surgical History:  Procedure Laterality Date  . CHOLECYSTECTOMY    . NM VQ LUNG SCAN ([place] HX)  09/2014   Low risk  . TONSILECTOMY, ADENOIDECTOMY, BILATERAL MYRINGOTOMY AND TUBES    .  TRANSTHORACIC ECHOCARDIOGRAM  12/18/13   Grade 1 diast dysf, o/w normal       Home Medications    Prior to Admission medications   Medication Sig Start Date End Date Taking? Authorizing Provider  aspirin 81 MG chewable tablet Chew 81 mg by mouth daily.    [provider]  atorvastatin (LIPITOR) 20 MG tablet Take 1 tablet (20 mg total) by mouth at bedtime. 01/25/17   McGowen, [name], MD  azelastine (OPTIVAR) 0.05 % ophthalmic solution Place 1 drop into both eyes 2 (two) times daily. 06/21/16   McGowen, [name], MD  finasteride (PROSCAR) 5 MG tablet TAKE ONE TABLET BY MOUTH EVERY DAY 01/25/17   McGowen, [name], MD  hydrALAZINE (APRESOLINE) 10 MG tablet Take 10 mg by mouth 3 (three) times daily.    [provider]  levobunolol (BETAGAN) 0.5 % ophthalmic solution Place 1 drop into both eyes 2 (two) times daily. 06/21/16   McGowen, [name], MD  mirabegron ER (MYRBETRIQ) 25 MG TB24 tablet Take 25 mg by mouth daily.    [provider]  Multiple Vitamins-Minerals (ICAPS AREDS 2 PO) Take 1 capsule by mouth 2 (two) times daily.    [provider]  polyethylene glycol powder (GLYCOLAX/MIRALAX) powder Take 17 g by mouth 2 (two) times daily as needed. 06/21/16   McGowen, [name], MD  rivastigmine (EXELON) 1.5 MG capsule TAKE 1 CAPSULE BY MOUTH TWICE DAILY 12/26/16   Cameron Sprang, MD  tamsulosin [facility]) 0.4 MG CAPS capsule TAKE 2 CAPSULES BY MOUTH AT BEDTIME 09/08/16   McGowen, [name], MD    Family History Family History  Problem Relation Age of Onset  . Cancer Mother   . Heart disease Mother     Social History Social History  Substance Use Topics  . Smoking status: Former Smoker    Types: Pipe  . Smokeless tobacco: Never Used  . Alcohol use No     Allergies   Altace [ramipril]; Amoxicillin; Cozaar [losartan potassium]; and Tramadol   Review of Systems Review of Systems  Unable to perform ROS: Dementia  Cardiovascular: Positive for chest pain.     Physical Exam Updated Vital Signs BP 140/75 (BP Location: Right Arm)   Pulse 67   Temp 97.8 F (36.6 C) (Oral)   Resp (!) 32   Ht 5\' 8"  (1.727 m)   Wt 64.9 kg (143 lb)   SpO2 100%   BMI 21.74 kg/m    19:16 Orthostatic Vital Signs HV  Orthostatic Lying   BP- Lying: 115/77  Pulse- Lying: 66      Orthostatic Sitting  BP- Sitting: 135/66  Pulse- Sitting: 71      Orthostatic Standing at 0 minutes  BP- Standing at 0 minutes: 114/54 (Pt only able to stand with a 2 person assist. Pt very weak upon  standing for a few seconds)  Pulse- Standing at 0 minutes: 74    Physical Exam 1850: Physical examination:  Nursing notes reviewed; Vital signs and O2 SAT reviewed;  Constitutional: Well developed, Well nourished, In no acute distress; Head:  Normocephalic, +skin cancer right temporal area with superficial abrasion.; Eyes: EOMI, PERRL, No scleral icterus; ENMT: Mouth and pharynx normal, Mucous membranes dry; Neck: Supple, Full range of motion, No lymphadenopathy; Cardiovascular: Regular rate and rhythm, No gallop; Respiratory: Breath sounds clear & equal bilaterally, No wheezes.  Speaking full sentences with ease, Normal respiratory effort/excursion; Chest: Nontender, Movement normal; Abdomen: Soft, Nontender, Nondistended, Normal bowel sounds; Genitourinary:  No CVA tenderness; Spine:  No midline CS, TS, LS tenderness.;; Extremities: Pulses normal, Pelvis stable. Pt able to lift extended bilat LE's up off stretcher without difficulty. No tenderness, +2 pedal edema bilat. No calf asymmetry.; Neuro: Awake, alert, mildly confused regarding events. Major CN grossly intact. No facial droop. Speech clear. Grips equal. Strength 5/5 equal bilat UE's and LE's. Pt moves all extremities spontaneously and to command without apparent gross focal motor deficits.; Skin: Color normal, Warm, Dry.   ED Treatments / Results  Labs (all labs ordered are listed, but only abnormal results are displayed)   EKG  EKG Interpretation  Date/Time:  Sunday February 11 2017 18:54:22 EDT Ventricular Rate:  68 PR Interval:    QRS Duration: 99 QT Interval:  405 QTC Calculation: 431 R Axis:   -26 Text Interpretation:  Sinus rhythm Borderline left axis deviation Artifact When compared with ECG of 02/04/2017 No significant change was found Confirmed by Francine Graven [phone]) on 02/11/2017 7:09:56 PM       Radiology   Procedures Procedures (including critical care time)  Medications Ordered in ED Medications - No data  to display   Initial Impression / Assessment and Plan / ED Course  I have reviewed the triage vital signs and the nursing notes.  Pertinent labs & imaging results that were available during my care of the patient were reviewed by me and considered in my medical decision making (see chart for details).  MDM Reviewed: previous chart, nursing note and vitals Reviewed previous: labs and ECG Interpretation: labs, ECG, x-ray and CT scan    Results for orders placed or performed during the hospital encounter of 02/11/17  Urinalysis, Routine w reflex microscopic  Result Value Ref Range   Color, Urine YELLOW YELLOW   APPearance CLEAR CLEAR   Specific Gravity, Urine 1.015 1.005 - 1.030   pH 8.0 5.0 - 8.0   Glucose, UA NEGATIVE NEGATIVE mg/dL   Hgb urine dipstick NEGATIVE NEGATIVE   Bilirubin Urine NEGATIVE NEGATIVE   Ketones, ur NEGATIVE NEGATIVE mg/dL   Protein, ur NEGATIVE NEGATIVE mg/dL   Nitrite NEGATIVE NEGATIVE   Leukocytes, UA NEGATIVE NEGATIVE  Comprehensive metabolic panel  Result Value Ref Range   Sodium 139 135 - 145 mmol/L   Potassium 3.8 3.5 - 5.1 mmol/L   Chloride 108 101 - 111 mmol/L   CO2 26 22 - 32 mmol/L   Glucose, Bld 104 (H) 65 - 99 mg/dL   BUN 15 6 - 20 mg/dL   Creatinine, Ser 0.96 0.61 - 1.24 mg/dL   Calcium 8.5 (L) 8.9 - 10.3 mg/dL   Total Protein 5.5 (L) 6.5 - 8.1 g/dL   Albumin 3.1 (L) 3.5 - 5.0 g/dL   AST 25 15 - 41 U/L   ALT 20 17 - 63 U/L   Alkaline Phosphatase 69 38 - 126 U/L   Total Bilirubin 0.8 0.3 - 1.2 mg/dL   GFR calc non Af Amer >60 >60 mL/min   GFR calc Af Amer >60 >60 mL/min   Anion gap 5 5 - 15  Brain natriuretic peptide  Result Value Ref Range   B Natriuretic Peptide 194.0 (H) 0.0 - 100.0 pg/mL  Troponin I  Result Value Ref Range   Troponin I 0.08 (HH) <0.03 ng/mL  Lactic acid, plasma  Result Value Ref Range   Lactic Acid, Venous 1.3 0.5 - 1.9 mmol/L  CBC with Differential  Result Value Ref Range   WBC 7.6 4.0 - 10.5 K/uL   RBC  3.35 (L) 4.22 - 5.81 MIL/uL   Hemoglobin 10.6 (L) 13.0 - 17.0 g/dL   HCT 30.5 (L) 39.0 - 52.0 %   MCV 91.0 78.0 - 100.0 fL   MCH 31.6 26.0 - 34.0 pg   MCHC 34.8 30.0 - 36.0 g/dL   RDW 13.6 11.5 - 15.5 %   Platelets 184 150 - 400 K/uL   Neutrophils Relative % 60 %   Neutro Abs 4.6 1.7 - 7.7 K/uL   Lymphocytes Relative 24 %   Lymphs Abs 1.8 0.7 - 4.0 K/uL   Monocytes Relative 12 %   Monocytes Absolute 0.9 0.1 - 1.0 K/uL   Eosinophils Relative 4 %   Eosinophils Absolute 0.3 0.0 - 0.7 K/uL   Basophils Relative 0 %   Basophils Absolute 0.0 0.0 - 0.1 K/uL   Ct Head Wo Contrast Result Date: 02/11/2017 CLINICAL DATA:  [place] while getting out of chair EXAM: CT HEAD WITHOUT CONTRAST CT CERVICAL SPINE WITHOUT CONTRAST TECHNIQUE: Multidetector CT imaging of the head and cervical spine was performed following the standard protocol without intravenous contrast. Multiplanar CT image reconstructions of the cervical spine were also generated. COMPARISON:  02/04/2017, 12/21/2014, MRI 07/27/2015 FINDINGS: CT HEAD FINDINGS Brain: No acute territorial infarction, hemorrhage, or intracranial mass is seen. Moderate severe small vessel ischemic changes of the white matter. Advanced atrophy. Stable ventricle size. Vascular: No hyperdense vessels.  Carotid artery calcifications. Skull: No fracture or suspicious lesion Sinuses/Orbits: Mild mucosal thickening in the ethmoid sinuses. No acute orbital abnormality. Superficial skin lesion along the right anterior temporal scalp measuring 15 mm. Other: None CT CERVICAL SPINE FINDINGS Alignment: 3 mm anterolisthesis of C7 on T1, slight increased compared to prior. Facet alignment is within normal limits. Skull base and vertebrae: Cranial vertebral junction is intact. No acute fracture is seen. Soft tissues and spinal canal: No prevertebral fluid or swelling. No visible canal hematoma. Disc levels: Bony fusion at C4 and C5. Multilevel advanced disc changes from C2 through T1.  Multiple level facet hypertrophic arthropathy. Right greater than left multiple level foraminal narrowing. Upper chest: Lung apices are clear. No thyroid mass. Carotid artery calcifications. Other: None IMPRESSION: 1. No definite CT evidence for acute intracranial abnormality. Advanced atrophy and small vessel ischemic changes of the white matter 2. 3 mm anterolisthesis of C7 on T1, slight progression compared to prior, likely due to degenerative change. MRI follow-up as indicated. Otherwise no fracture is seen. Extensive degenerative changes of the cervical spine. Electronically Signed   By: Donavan Foil M.D.   On: 02/11/2017 20:14   Ct Cervical Spine Wo Contrast Result Date: 02/11/2017 CLINICAL DATA:  [place] while getting out of chair EXAM: CT HEAD WITHOUT CONTRAST CT CERVICAL SPINE WITHOUT CONTRAST TECHNIQUE: Multidetector CT imaging of the head and cervical spine was performed following the standard protocol without intravenous contrast. Multiplanar CT image reconstructions of the cervical spine were also generated. COMPARISON:  02/04/2017, 12/21/2014, MRI 07/27/2015 FINDINGS: CT HEAD FINDINGS Brain: No acute territorial infarction, hemorrhage, or intracranial mass is seen. Moderate severe small vessel ischemic changes of the white matter. Advanced atrophy. Stable ventricle size. Vascular: No hyperdense vessels.  Carotid artery calcifications. Skull: No fracture or suspicious lesion Sinuses/Orbits: Mild mucosal thickening in the ethmoid sinuses. No acute orbital abnormality. Superficial skin lesion along the right anterior temporal scalp measuring 15 mm. Other: None CT CERVICAL SPINE FINDINGS Alignment: 3 mm anterolisthesis of C7 on T1, slight increased compared to prior. Facet alignment is within normal limits. Skull base and vertebrae:  Cranial vertebral junction is intact. No acute fracture is seen. Soft tissues and spinal canal: No prevertebral fluid or swelling. No visible canal hematoma. Disc levels: Bony  fusion at C4 and C5. Multilevel advanced disc changes from C2 through T1. Multiple level facet hypertrophic arthropathy. Right greater than left multiple level foraminal narrowing. Upper chest: Lung apices are clear. No thyroid mass. Carotid artery calcifications. Other: None IMPRESSION: 1. No definite CT evidence for acute intracranial abnormality. Advanced atrophy and small vessel ischemic changes of the white matter 2. 3 mm anterolisthesis of C7 on T1, slight progression compared to prior, likely due to degenerative change. MRI follow-up as indicated. Otherwise no fracture is seen. Extensive degenerative changes of the cervical spine. Electronically Signed   By: Donavan Foil M.D.   On: 02/11/2017 20:14   Dg Chest Port 1 View Result Date: 02/11/2017 CLINICAL DATA:  [place] at home getting out of chair. LEFT chest pain. Difficulty breathing. EXAM: PORTABLE CHEST 1 VIEW COMPARISON:  Chest radiograph February 04, 2017 FINDINGS: Cardiomediastinal silhouette is unremarkable for this low inspiratory examination with crowded vasculature markings. Calcified aortic arch. The lungs are clear without pleural effusions or focal consolidations. Trachea projects midline and there is no pneumothorax. Included soft tissue planes and osseous structures are non-suspicious. Surgical clips in the included right abdomen compatible with cholecystectomy. IMPRESSION: Stable examination:  No acute cardiopulmonary process. Aortic Atherosclerosis (ICD10-I70.0). Electronically Signed   By: Elon Alas M.D.   On: 02/11/2017 19:13     2100:  BNP mildly elevated but no acute CHF on CXR. Troponin also mildly elevated (not per baseline). Continues to state he "feels better" (re: CP/SOB) since arrival to ED. Pt unable to stand for orthostatic VS without significant assist x2. Will observation admit. T/C to [facility] Dr. Olevia Bowens, case discussed, including:  HPI, pertinent PM/SHx, VS/PE, dx testing, ED course and treatment:  Agreeable to admit.      Final Clinical Impressions(s) / ED Diagnoses   Final diagnoses:  None    New Prescriptions New Prescriptions   No medications on file      Francine Graven, DO 02/15/17 1637

## 2017-02-11 NOTE — ED Notes (Signed)
Report to [place], RN   Pt clothing, cell phone and cards placed in his clothing bag

## 2017-02-11 NOTE — ED Notes (Signed)
Call to [place], there are no visitors for pt

## 2017-02-11 NOTE — H&P (Signed)
History and Physical    Lance Kemp YJE:[id number] DOB: [birth year] DOA: 02/11/2017  PCP: Lance Sou, MD   Patient coming from: Home.  I have personally briefly reviewed patient's old medical records in [place]  Chief Complaint: Chest pain.  HPI: Lance Kemp is a 83 y.o. male with medical history significant of basal cell carcinoma of the face, BPH, chronic back pain, chronic diastolic heart failure, chronic left shoulder pain, chronic renal insufficiency chronic venous insufficiency, history of CVA, history of mild dementia, depression, frequent headaches, glaucoma, hypertension, normocytic anemia, CAD status post non-STEMI, or AV, osteoarthritis, peripheral neuropathy, urinary incontinence, vertigo was brought to the emergency department after having sudden onset chest pain,  on his left side and left upper abdomen following a fall at home while trying to get out of his chair. He described the pain as nonradiating, pressure-like, associated with dyspnea, mild nausea and lightheadedness. He was given sublingual nitroglycerin by EMS, which caused hypotension. He was given a small fluid bolus with improvement as well. When the patient arrived to the ER, he stated he felt better. He denied chest pain, dizziness or nausea at the time of my examination. He complains of chronic arthritic pain, particularly of his left shoulder. He denies fever, chills, headache, sore throat, productive cough, emesis, diarrhea, constipation, melena, hematochezia, dysuria, frequency or hematuria.   ED Course: Initial vital signs temperature 36.6C, pulse 67, blood pressure 140/75 mmHg, respirations 32 and O2 sat 100% on room air. His EKG was sinus rhythm with borderline left axis deviation, BNP was 194 pg/mL and troponin 0.08 ng/mm. His urinalysis was negative. Hemoglobin 10.6 g/dL, WBC 7.6 and platelets 584. His CMP showed a glucose of 104 mg/dL, total protein of 5.5 and albumin of 3.1 g/dL, but all the  results were within normal limits. Chest radiograph does not show acute cardiopulmonary pathology.  Review of Systems: As per HPI otherwise 10 point review of systems negative.    Past Medical History:  Diagnosis Date  . Basal cell carcinoma 2011; 2016   Face  . BPH with obstruction/lower urinary tract symptoms   . Chronic back pain   . Chronic diastolic heart failure ([place])   . Chronic left shoulder pain 09/14/2013  . Chronic renal insufficiency, stage III (moderate) 2013   CrCl in the 50s-60s  . Chronic venous insufficiency    compression hose  . 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  No significant residual deficit except short term memory impairment  . Dementia 2016   Lance Kemp 08/2014--started aricept but pt did not tolerate.  Exelon trial started by Lance Kemp 05/2016.  Marland Kitchen Depression    antidepressant x 1 yr--situational. Resolved 08/2013 per pt/son.  . Frequent headaches   . Glaucoma   . Hypertension   . Normocytic anemia 2015   secondary to CRI (iron studies ok 12/2013)  . NSTEMI (non-ST elevated myocardial infarction) ([place]) 06/2011   [place]: Troponin mildly elevated, normal EKG, normal ECHO, pt declined cardiology consult--per old PCP records.  Marland Kitchen OAB (overactive bladder)   . Osteoarthritis    hands/wrists  . Peripheral neuropathy 2013   Dx'd by neuro in [place] at the time the patient was hospitalized briefly for pontine CVA.  Marland Kitchen Shortness of breath   . Urine incontinence   . Vertigo     Past Surgical History:  Procedure Laterality Date  . CHOLECYSTECTOMY    . NM VQ LUNG SCAN ([place] [facility])  09/2014   Low risk  . TONSILECTOMY, ADENOIDECTOMY,  BILATERAL MYRINGOTOMY AND TUBES    . TRANSTHORACIC ECHOCARDIOGRAM  12/18/13   Grade 1 diast dysf, o/w normal     reports that he has quit smoking. His smoking use included Pipe. He has never used smokeless tobacco. He reports that he does not drink alcohol or use drugs.  Allergies  Allergen Reactions  . Altace [Ramipril]  Other (See Comments)    Reaction:  Unknown   . Amoxicillin Other (See Comments)    Reaction:  Unknown  Has patient had a PCN reaction causing immediate rash, facial/tongue/throat swelling, SOB or lightheadedness with hypotension: Unsure Has patient had a PCN reaction causing severe rash involving mucus membranes or skin necrosis: Unsure Has patient had a PCN reaction that required hospitalization Unsure Has patient had a PCN reaction occurring within the last 10 years: Unsure If all of the above answers are "NO", then may proceed with Cephalosporin use.  Lance Kemp Potassium] Other (See Comments)    Reaction:  Unknown   . Tramadol Other (See Comments)    Makes patient feel very drowsy/almost too strong to take    Family History  Problem Relation Age of Onset  . Cancer Mother   . Heart disease Mother     Prior to Admission medications   Medication Sig Start Date End Date Taking? Authorizing Provider  aspirin 81 MG chewable tablet Chew 81 mg by mouth daily.   Yes [provider]  atorvastatin (LIPITOR) 20 MG tablet Take 1 tablet (20 mg total) by mouth at bedtime. 01/25/17  Yes McGowen, [name], MD  azelastine (OPTIVAR) 0.05 % ophthalmic solution Place 1 drop into both eyes 2 (two) times daily. 06/21/16  Yes McGowen, [name], MD  finasteride (PROSCAR) 5 MG tablet TAKE ONE TABLET BY MOUTH EVERY DAY 01/25/17  Yes McGowen, [name], MD  hydrALAZINE (APRESOLINE) 10 MG tablet Take 10 mg by mouth 3 (three) times daily.   Yes [provider]  levobunolol (BETAGAN) 0.5 % ophthalmic solution Place 1 drop into both eyes 2 (two) times daily. 06/21/16  Yes McGowen, [name], MD  mirabegron ER (MYRBETRIQ) 25 MG TB24 tablet Take 25 mg by mouth daily.   Yes [provider]  Multiple Vitamins-Minerals (ICAPS AREDS 2 PO) Take 1 capsule by mouth 2 (two) times daily.   Yes [provider]  polyethylene glycol powder (GLYCOLAX/MIRALAX) powder Take 17 g by mouth 2 (two)  times daily as needed. 06/21/16  Yes McGowen, [name], MD  rivastigmine (EXELON) 1.5 MG capsule TAKE 1 CAPSULE BY MOUTH TWICE DAILY 12/26/16  Yes Cameron Sprang, MD  tamsulosin [facility]) 0.4 MG CAPS capsule TAKE 2 CAPSULES BY MOUTH AT BEDTIME 09/08/16  Yes Lance Sou, MD    Physical Exam: Vitals:   02/11/17 1930 02/11/17 2000 02/11/17 2022 02/11/17 2030  BP: (!) 151/53 (!) 155/70 (!) 155/70 (!) 157/57  Pulse:   65 61  Resp: 16 19 17  (!) 24  Temp:      TempSrc:      SpO2:   97% 100%  Weight:      Height:        Constitutional: NAD, calm, comfortable Eyes: PERRL, lids and conjunctivae normal ENMT: Mucous membranes are moist. Posterior pharynx clear of any exudate or lesions.  Neck: normal, supple, no masses, no thyromegaly Respiratory: clear to auscultation bilaterally, no wheezing, no crackles. Normal respiratory effort. No accessory muscle use.  Cardiovascular: Regular rate and rhythm, no murmurs / rubs / gallops. 2+ lower extremities  edema. 2+ pedal pulses. No carotid bruits.  Abdomen: Soft, no tenderness, no masses palpated. No hepatosplenomegaly. Bowel sounds positive.  Musculoskeletal: no clubbing / cyanosis. Good ROM, no contractures. Normal muscle tone.  Skin: Positive skin ca lesion with dressing on right frontotemporal area. Multiple skin lessions on face, scalp and ears. Positive ecchymosis areas on both forearms R>L. Neurologic: CN 2-12 grossly intact. Sensation intact, DTR normal. Strength 5/5 in all 4.  Psychiatric: Normal judgment and insight. Alert and oriented x 3, fairly oriented to situation. Feels sorry for his son and daughter in law, who planned his birthday celebration and unfortunately he had to be brought to the ER. Initially anxious on arrival, but normal mood at this time.     Labs on Admission: I have personally reviewed following labs and imaging studies  CBC:  Recent Labs Lab 02/11/17 1932  WBC 7.6  NEUTROABS 4.6  HGB 10.6*  HCT 30.5*  MCV  91.0  PLT 315   Basic Metabolic Panel:  Recent Labs Lab 02/11/17 1932  NA 139  K 3.8  CL 108  CO2 26  GLUCOSE 104*  BUN 15  CREATININE 0.96  CALCIUM 8.5*   GFR: Estimated Creatinine Clearance: 45.1 mL/min (by C-G formula based on SCr of 0.96 mg/dL). Liver Function Tests:  Recent Labs Lab 02/11/17 1932  AST 25  ALT 20  ALKPHOS 69  BILITOT 0.8  PROT 5.5*  ALBUMIN 3.1*   No results for input(s): LIPASE, AMYLASE in the last 168 hours. No results for input(s): AMMONIA in the last 168 hours. Coagulation Profile: No results for input(s): INR, PROTIME in the last 168 hours. Cardiac Enzymes:  Recent Labs Lab 02/11/17 1932  TROPONINI 0.08*   BNP (last 3 results) No results for input(s): PROBNP in the last 8760 hours. HbA1C: No results for input(s): HGBA1C in the last 72 hours. CBG: No results for input(s): GLUCAP in the last 168 hours. Lipid Profile: No results for input(s): CHOL, HDL, LDLCALC, TRIG, CHOLHDL, LDLDIRECT in the last 72 hours. Thyroid Function Tests: No results for input(s): TSH, T4TOTAL, FREET4, T3FREE, THYROIDAB in the last 72 hours. Anemia Panel: No results for input(s): VITAMINB12, FOLATE, FERRITIN, TIBC, IRON, RETICCTPCT in the last 72 hours. Urine analysis:    Component Value Date/Time   COLORURINE YELLOW 02/11/2017 1917   APPEARANCEUR CLEAR 02/11/2017 1917   LABSPEC 1.015 02/11/2017 1917   PHURINE 8.0 02/11/2017 1917   GLUCOSEU NEGATIVE 02/11/2017 1917   HGBUR NEGATIVE 02/11/2017 [place] 02/11/2017 1917   BILIRUBINUR neg 09/24/2015 1504   KETONESUR NEGATIVE 02/11/2017 1917   PROTEINUR NEGATIVE 02/11/2017 1917   UROBILINOGEN 0.2 09/24/2015 1504   UROBILINOGEN 0.2 09/10/2014 1052   NITRITE NEGATIVE 02/11/2017 1917   LEUKOCYTESUR NEGATIVE 02/11/2017 1917    Radiological Exams on Admission: Ct Head Wo Contrast  Result Date: 02/11/2017 CLINICAL DATA:  [place] while getting out of chair EXAM: CT HEAD WITHOUT CONTRAST CT  CERVICAL SPINE WITHOUT CONTRAST TECHNIQUE: Multidetector CT imaging of the head and cervical spine was performed following the standard protocol without intravenous contrast. Multiplanar CT image reconstructions of the cervical spine were also generated. COMPARISON:  02/04/2017, 12/21/2014, MRI 07/27/2015 FINDINGS: CT HEAD FINDINGS Brain: No acute territorial infarction, hemorrhage, or intracranial mass is seen. Moderate severe small vessel ischemic changes of the white matter. Advanced atrophy. Stable ventricle size. Vascular: No hyperdense vessels.  Carotid artery calcifications. Skull: No fracture or suspicious lesion Sinuses/Orbits: Mild mucosal thickening in the ethmoid sinuses. No acute orbital abnormality.  Superficial skin lesion along the right anterior temporal scalp measuring 15 mm. Other: None CT CERVICAL SPINE FINDINGS Alignment: 3 mm anterolisthesis of C7 on T1, slight increased compared to prior. Facet alignment is within normal limits. Skull base and vertebrae: Cranial vertebral junction is intact. No acute fracture is seen. Soft tissues and spinal canal: No prevertebral fluid or swelling. No visible canal hematoma. Disc levels: Bony fusion at C4 and C5. Multilevel advanced disc changes from C2 through T1. Multiple level facet hypertrophic arthropathy. Right greater than left multiple level foraminal narrowing. Upper chest: Lung apices are clear. No thyroid mass. Carotid artery calcifications. Other: None IMPRESSION: 1. No definite CT evidence for acute intracranial abnormality. Advanced atrophy and small vessel ischemic changes of the white matter 2. 3 mm anterolisthesis of C7 on T1, slight progression compared to prior, likely due to degenerative change. MRI follow-up as indicated. Otherwise no fracture is seen. Extensive degenerative changes of the cervical spine. Electronically Signed   By: Donavan Foil M.D.   On: 02/11/2017 20:14   Ct Cervical Spine Wo Contrast  Result Date:  02/11/2017 CLINICAL DATA:  [place] while getting out of chair EXAM: CT HEAD WITHOUT CONTRAST CT CERVICAL SPINE WITHOUT CONTRAST TECHNIQUE: Multidetector CT imaging of the head and cervical spine was performed following the standard protocol without intravenous contrast. Multiplanar CT image reconstructions of the cervical spine were also generated. COMPARISON:  02/04/2017, 12/21/2014, MRI 07/27/2015 FINDINGS: CT HEAD FINDINGS Brain: No acute territorial infarction, hemorrhage, or intracranial mass is seen. Moderate severe small vessel ischemic changes of the white matter. Advanced atrophy. Stable ventricle size. Vascular: No hyperdense vessels.  Carotid artery calcifications. Skull: No fracture or suspicious lesion Sinuses/Orbits: Mild mucosal thickening in the ethmoid sinuses. No acute orbital abnormality. Superficial skin lesion along the right anterior temporal scalp measuring 15 mm. Other: None CT CERVICAL SPINE FINDINGS Alignment: 3 mm anterolisthesis of C7 on T1, slight increased compared to prior. Facet alignment is within normal limits. Skull base and vertebrae: Cranial vertebral junction is intact. No acute fracture is seen. Soft tissues and spinal canal: No prevertebral fluid or swelling. No visible canal hematoma. Disc levels: Bony fusion at C4 and C5. Multilevel advanced disc changes from C2 through T1. Multiple level facet hypertrophic arthropathy. Right greater than left multiple level foraminal narrowing. Upper chest: Lung apices are clear. No thyroid mass. Carotid artery calcifications. Other: None IMPRESSION: 1. No definite CT evidence for acute intracranial abnormality. Advanced atrophy and small vessel ischemic changes of the white matter 2. 3 mm anterolisthesis of C7 on T1, slight progression compared to prior, likely due to degenerative change. MRI follow-up as indicated. Otherwise no fracture is seen. Extensive degenerative changes of the cervical spine. Electronically Signed   By: Donavan Foil  M.D.   On: 02/11/2017 20:14   Dg Chest Port 1 View  Result Date: 02/11/2017 CLINICAL DATA:  [place] at home getting out of chair. LEFT chest pain. Difficulty breathing. EXAM: PORTABLE CHEST 1 VIEW COMPARISON:  Chest radiograph February 04, 2017 FINDINGS: Cardiomediastinal silhouette is unremarkable for this low inspiratory examination with crowded vasculature markings. Calcified aortic arch. The lungs are clear without pleural effusions or focal consolidations. Trachea projects midline and there is no pneumothorax. Included soft tissue planes and osseous structures are non-suspicious. Surgical clips in the included right abdomen compatible with cholecystectomy. IMPRESSION: Stable examination:  No acute cardiopulmonary process. Aortic Atherosclerosis (ICD10-I70.0). Electronically Signed   By: Elon Alas M.D.   On: 02/11/2017 19:13   12/18/2013 2D echocardiogram  with bubble study ------------------------------------------------------------------- LV EF: 55% -  60%  ------------------------------------------------------------------- Indications:   CHF - 428.0.  ------------------------------------------------------------------- History:  Risk factors: Venous insufficiency. H/o CVA. Hypoxia. Left shoulder pain. BPH. Memory loss. Hypertension.  ------------------------------------------------------------------- Study Conclusions  - Left ventricle: The cavity size was normal. Systolic function was normal. The estimated ejection fraction was in the range of 55% to 60%. Wall motion was normal; there were no regional wall motion abnormalities. Doppler parameters are consistent with abnormal left ventricular relaxation (grade 1 diastolic dysfunction). - Left atrium: The atrium was mildly dilated. - Pulmonary arteries: Systolic pressure was moderately increased. PA peak pressure: 45 mm Hg (S).  EKG: Independently reviewed Vent. rate 68 BPM PR interval * ms QRS duration 99  ms QT/QTc 405/431 ms P-R-T axes 52 -26 64 Sinus rhythm Borderline left axis deviation Artifac  Assessment/Plan Principal Problem:   Elevated troponin Telemetry/observation. Continue aspirin. Continue supplemental oxygen. Trend troponin levels. Check echocardiogram in a.m. Cardiology evaluation in a.m.  Active Problems:   History of CVA (cerebrovascular accident) Continue aspirin, atorvastatin and antihypertensives.    BPH associated with nocturia Continue Flomax 0.8 mg by mouth at bedtime.    Chronic renal insufficiency, stage III (moderate) Monitor renal function and electrolytes.    Chronic diastolic heart failure (HCC) Compensated. Continue hydralazine 10 mg by mouth 3 times a day.    Essential hypertension Continue hydralazine 10 mg by mouth 3 times a day. Monitor blood pressure.    Hyperlipidemia Continue atorvastatin 20 mg by mouth daily. Monitor LFTs as needed. Fasting lipid profile follow-up as an outpatient.    Mild dementia Supportive care.    Normocytic anemia Check anemia panel. Monitor hematocrit and hemoglobin.     DVT prophylaxis: Lovenox SQ. Code Status: DO NOT RESUSCITATE/DO NOT INTUBATE. Family Communication:  Disposition Plan: Trend troponin levels and check echocardiogram. Consults called:  Admission status: Observation/telemetry.   Reubin Milan MD [facility] Pager [phone].  If 7PM-7AM, please contact night-coverage [email or web address] Password [facility]  02/11/2017, 9:14 PM

## 2017-02-11 NOTE — ED Notes (Signed)
Dr Thurnell Garbe met pt upon arrival

## 2017-02-11 NOTE — ED Notes (Signed)
Per radiology, complete echocardiogram will be completed tomorrow

## 2017-02-11 NOTE — ED Notes (Signed)
Dr Jenetta Downer in to assess and admit

## 2017-02-11 NOTE — ED Notes (Signed)
Call from Lab  Critical value   Trop 0.08    Dr Tonye Becket made aware

## 2017-02-12 ENCOUNTER — Encounter (HOSPITAL_COMMUNITY): Payer: Self-pay | Admitting: Cardiology

## 2017-02-12 ENCOUNTER — Observation Stay (HOSPITAL_COMMUNITY): Payer: Medicare Other

## 2017-02-12 DIAGNOSIS — M6281 Muscle weakness (generalized): Secondary | ICD-10-CM | POA: Diagnosis not present

## 2017-02-12 DIAGNOSIS — M542 Cervicalgia: Secondary | ICD-10-CM | POA: Diagnosis not present

## 2017-02-12 DIAGNOSIS — R748 Abnormal levels of other serum enzymes: Secondary | ICD-10-CM | POA: Diagnosis not present

## 2017-02-12 LAB — CBC WITH DIFFERENTIAL/PLATELET
Basophils Absolute: 0 10*3/uL (ref 0.0–0.1)
Basophils Relative: 0 %
EOS ABS: 0.3 10*3/uL (ref 0.0–0.7)
Eosinophils Relative: 5 %
HEMATOCRIT: 29.3 % — AB (ref 39.0–52.0)
HEMOGLOBIN: 10.1 g/dL — AB (ref 13.0–17.0)
LYMPHS ABS: 2 10*3/uL (ref 0.7–4.0)
Lymphocytes Relative: 29 %
MCH: 31.5 pg (ref 26.0–34.0)
MCHC: 34.5 g/dL (ref 30.0–36.0)
MCV: 91.3 fL (ref 78.0–100.0)
MONO ABS: 0.8 10*3/uL (ref 0.1–1.0)
MONOS PCT: 12 %
NEUTROS PCT: 54 %
Neutro Abs: 3.8 10*3/uL (ref 1.7–7.7)
Platelets: 176 10*3/uL (ref 150–400)
RBC: 3.21 MIL/uL — ABNORMAL LOW (ref 4.22–5.81)
RDW: 13.4 % (ref 11.5–15.5)
WBC: 6.9 10*3/uL (ref 4.0–10.5)

## 2017-02-12 LAB — TROPONIN I
TROPONIN I: 0.15 ng/mL — AB (ref <0.03)
TROPONIN I: 0.08 ng/mL — AB (ref <0.03)
Troponin I: 0.06 ng/mL (ref <0.03)

## 2017-02-12 LAB — RETICULOCYTES
RBC.: 3.21 MIL/uL — AB (ref 4.22–5.81)
RETIC CT PCT: 1.6 % (ref 0.4–3.1)
Retic Count, Absolute: 51.4 10*3/uL (ref 19.0–186.0)

## 2017-02-12 LAB — FOLATE: Folate: 37 ng/mL (ref >5.9)

## 2017-02-12 LAB — IRON AND TIBC
Iron: 45 ug/dL (ref 45–182)
SATURATION RATIOS: 25 % (ref 17.9–39.5)
TIBC: 183 ug/dL — ABNORMAL LOW (ref 250–450)
UIBC: 138 ug/dL

## 2017-02-12 LAB — FERRITIN: Ferritin: 149 ng/mL (ref 24–336)

## 2017-02-12 LAB — VITAMIN B12: VITAMIN B 12: 331 pg/mL (ref 180–914)

## 2017-02-12 MED ORDER — SODIUM CHLORIDE 0.9% FLUSH: 3.0000 mL | Freq: Two times a day (BID) | INTRAVENOUS | Status: DC

## 2017-02-12 MED ORDER — POLYETHYLENE GLYCOL 3350 17 G PO PACK: 17.0000 g | PACK | Freq: Two times a day (BID) | ORAL | Status: DC | PRN

## 2017-02-12 MED ORDER — NITROGLYCERIN 0.4 MG SL SUBL: 0.4000 mg | SUBLINGUAL_TABLET | SUBLINGUAL | Status: DC | PRN

## 2017-02-12 MED ORDER — SODIUM CHLORIDE 0.9 % IV SOLN: 250.0000 mL | INTRAVENOUS | Status: DC | PRN

## 2017-02-12 MED ORDER — SODIUM CHLORIDE 0.9% FLUSH: 3.0000 mL | INTRAVENOUS | Status: DC | PRN

## 2017-02-12 MED ORDER — ACETAMINOPHEN 325 MG PO TABS
650.0000 mg | ORAL_TABLET | Freq: Four times a day (QID) | ORAL | Status: DC | PRN
  Administered 2017-02-12: 650 mg via ORAL
  Filled 2017-02-12: qty 2

## 2017-02-12 NOTE — Care Management (Signed)
CM has made multiple attempts to contact pt's son who should be given MOON form. CM has left 2 VM and make 3 other attempts to call since 0900.

## 2017-02-12 NOTE — Discharge Summary (Addendum)
DISCHARGE SUMMARY  Lance Kemp  MR#: [id number]  DOB:[birth year]  Date of Admission: 02/11/2017 Date of Discharge: 02/12/2017  Attending Physician:Phung Kotas T  Patient's JIR:[id number], Lance Kemp  Consults:  None   Disposition: D/C home w/ son - HHPT suggested, but pt and son reportedly prefer to discuss w/ his PCP prior to arranging   Follow-up Appts: Follow-up Information    Lance Kemp, Lance Kemp. Schedule an appointment as soon as possible for a visit in 1 week(s).   Specialty:  Family Medicine Contact information: [address] [place] [address] [phone]           Discharge Diagnoses: Mechanical fall v/s orthostatic syncope Elevated troponin History of CVA  BPH associated with nocturia Chronic renal insufficiency, stage III  Chronic diastolic heart failure Essential hypertension Hyperlipidemia Mild dementia Normocytic anemia  Initial presentation: 83 y.o.malewith a history of basal cell carcinoma of the face, BPH, chronic back pain, chronic diastolic heart failure, chronic left shoulder pain, chronic renal insufficiency, chronic venous insufficiency, CVA, mild dementia, depression, frequent headaches, glaucoma, HTN, normocytic anemia, CAD, osteoarthritis, peripheral neuropathy, urinary incontinence, and vertigo who was brought to the ED w/ the sudden onset of chest pain and upper abdomen pain after a fall at home while trying to get out of a chair.His pain had resolved by the time he arrived at the ED.    His ED w/u was essentially unrevealing.    Hospital Course:  Mechanical fall v/s orthostatic syncope Patient described getting up from a seated position that he had held for some time and walking across the room shortly after which time he fell to the floor - given the patient's advanced age there certainly is a possibility this was orthostatic hypotension - the patient is counseled on the need to make very slow postural changes -  extensive workup is not likely to benefit this elderly gentleman - If this recurs the patient's Flomax may need to be discontinued - physical therapy evaluated the patient and felt he was appropriate for HHPT - it is suggested that he have 24hr supervision    Elevated troponin Troponin peaked at 0.15 - no SSCP or sx to suggest true angina - no acute findings on EKG - likely related to MSK injury from fall, or very mild distress from fall - no further inpt w/u indicated   History of CVA  Continue aspirin, atorvastatin and antihypertensives.  BPH associated with nocturia Continue Flomax for now - see discussion above   Chronic renal insufficiency, stage III  Renal fxn stable w/ crt ~6.1  Chronic diastolic heart failure No signif volume overload   Essential hypertension  Hyperlipidemia Continue atorvastatin   Mild dementia Supportive care  Normocytic anemia  Allergies as of 02/12/2017      Reactions   Altace [ramipril] Other (See Comments)   Reaction:  Unknown    Amoxicillin Other (See Comments)   Reaction:  Unknown  Has patient had a PCN reaction causing immediate rash, facial/tongue/throat swelling, SOB or lightheadedness with hypotension: Unsure Has patient had a PCN reaction causing severe rash involving mucus membranes or skin necrosis: Unsure Has patient had a PCN reaction that required hospitalization Unsure Has patient had a PCN reaction occurring within the last 10 years: Unsure If all of the above answers are "NO", then may proceed with Cephalosporin use.   Cozaar [losartan Potassium] Other (See Comments)   Reaction:  Unknown    Tramadol Other (See Comments)   Makes patient feel very  drowsy/almost too strong to take      Medication List    TAKE these medications   aspirin 81 MG chewable tablet Chew 81 mg by mouth daily.   atorvastatin 20 MG tablet Commonly known as:  LIPITOR Take 1 tablet (20 mg total) by mouth at bedtime.   azelastine 0.05 %  ophthalmic solution Commonly known as:  OPTIVAR Place 1 drop into both eyes 2 (two) times daily.   finasteride 5 MG tablet Commonly known as:  PROSCAR TAKE ONE TABLET BY MOUTH EVERY DAY   hydrALAZINE 10 MG tablet Commonly known as:  APRESOLINE Take 10 mg by mouth 3 (three) times daily.   ICAPS AREDS 2 PO Take 1 capsule by mouth 2 (two) times daily.   levobunolol 0.5 % ophthalmic solution Commonly known as:  BETAGAN Place 1 drop into both eyes 2 (two) times daily.   MYRBETRIQ 25 MG Tb24 tablet Generic drug:  mirabegron ER Take 25 mg by mouth daily.   polyethylene glycol powder powder Commonly known as:  GLYCOLAX/MIRALAX Take 17 g by mouth 2 (two) times daily as needed.   rivastigmine 1.5 MG capsule Commonly known as:  EXELON TAKE 1 CAPSULE BY MOUTH TWICE DAILY   tamsulosin 0.4 MG Caps capsule Commonly known as:  FLOMAX TAKE 2 CAPSULES BY MOUTH AT BEDTIME            Discharge Care Instructions        Start     Ordered   02/12/17 0000  Increase activity slowly     02/12/17 1508      Day of Discharge BP (!) 141/67 (BP Location: Right Arm)   Pulse 78   Temp 97.6 F (36.4 C) (Oral)   Resp 16   Ht 5\' 7"  (1.702 m)   Wt 64.5 kg (142 lb 3.2 oz)   SpO2 100%   BMI 22.27 kg/m   Physical Exam: General: No acute respiratory distress Lungs: Clear to auscultation bilaterally without wheezes or crackles Cardiovascular: Regular rate and rhythm without murmur  Abdomen: Nontender, nondistended, soft, bowel sounds positive, no rebound, no ascites, no appreciable mass Extremities: No significant cyanosis, clubbing, or edema bilateral lower extremities  Basic Metabolic Panel:  Recent Labs Lab 02/11/17 1932  NA 139  K 3.8  CL 108  CO2 26  GLUCOSE 104*  BUN 15  CREATININE 0.96  CALCIUM 8.5*    Liver Function Tests:  Recent Labs Lab 02/11/17 1932  AST 25  ALT 20  ALKPHOS 69  BILITOT 0.8  PROT 5.5*  ALBUMIN 3.1*    CBC:  Recent Labs Lab  02/11/17 1932 02/12/17 0607  WBC 7.6 6.9  NEUTROABS 4.6 3.8  HGB 10.6* 10.1*  HCT 30.5* 29.3*  MCV 91.0 91.3  PLT 184 176    Cardiac Enzymes:  Recent Labs Lab 02/11/17 1932 02/12/17 0146 02/12/17 0607 02/12/17 1333  TROPONINI 0.08* 0.15* 0.08* 0.06*   BNP (last 3 results)  Recent Labs  04/19/16 0706 02/11/17 1933  BNP 55.4 194.0*    Time spent in discharge (includes decision making & examination of pt): 30 minutes  02/12/2017, 3:11 PM   Cherene Altes, Kemp [facility] Office  [phone] Pager [phone]  On-Call/Text Page:      [email or web address]      password [facility]

## 2017-02-12 NOTE — Care Management Obs Status (Signed)
[place] NOTIFICATION   Patient Details  Name: Glynn Yepes MRN: [id number] Date of Birth: [birth year]   Medicare Observation Status Notification Given:  Yes    Sherald Barge, RN 02/12/2017, 2:50 PM

## 2017-02-12 NOTE — Progress Notes (Signed)
CRITICAL VALUE ALERT  Critical Value:  0.15  Date & Time Notied:  02/12/2017   0255  Provider Notified: Darrick Meigs  Orders Received/Actions taken:

## 2017-02-12 NOTE — Discharge Instructions (Signed)
Orthostatic Hypotension Orthostatic hypotension is a sudden drop in blood pressure that happens when you quickly change positions, such as when you get up from a seated or lying position. Blood pressure is a measurement of how strongly, or weakly, your blood is pressing against the walls of your arteries. Arteries are blood vessels that carry blood from your heart throughout your body. When blood pressure is too low, you may not get enough blood to your brain or to the rest of your organs. This can cause weakness, light-headedness, rapid heartbeat, and fainting. This can last for just a few seconds or for up to a few minutes. Orthostatic hypotension is usually not a serious problem. However, if it happens frequently or gets worse, it may be a sign of something more serious. What are the causes? This condition may be caused by:  Sudden changes in posture, such as standing up quickly after you have been sitting or lying down.  Blood loss.  Loss of body fluids (dehydration).  Heart problems.  Hormone (endocrine) problems.  Pregnancy.  Severe infection.  Lack of certain nutrients.  Severe allergic reactions (anaphylaxis).  Certain medicines, such as blood pressure medicine or medicines that make the body lose excess fluids (diuretics). Sometimes, this condition can be caused by not taking medicine as directed, such as taking too much of a certain medicine.  What increases the risk? Certain factors can make you more likely to develop orthostatic hypotension, including:  Age. Risk increases as you get older.  Conditions that affect the heart or the central nervous system.  Taking certain medicines, such as blood pressure medicine or diuretics.  Being pregnant.  What are the signs or symptoms? Symptoms of this condition may include:  Weakness.  Light-headedness.  Dizziness.  Blurred vision.  Fatigue.  Rapid heartbeat.  Fainting, in severe cases.  How is this  diagnosed? This condition is diagnosed based on:  Your medical history.  Your symptoms.  Your blood pressure measurement. Your health care provider will check your blood pressure when you are: ? Lying down. ? Sitting. ? Standing.  A blood pressure reading is recorded as two numbers, such as "120 over 80" (or 120/80). The first ("top") number is called the systolic pressure. It is a measure of the pressure in your arteries as your heart beats. The second ("bottom") number is called the diastolic pressure. It is a measure of the pressure in your arteries when your heart relaxes between beats. Blood pressure is measured in a unit called mm Hg. Healthy blood pressure for adults is 120/80. If your blood pressure is below 90/60, you may be diagnosed with hypotension. Other information or tests that may be used to diagnose orthostatic hypotension include:  Your other vital signs, such as your heart rate and temperature.  Blood tests.  Tilt table test. For this test, you will be safely secured to a table that moves you from a lying position to an upright position. Your heart rhythm and blood pressure will be monitored during the test.  How is this treated? Treatment for this condition may include:  Changing your diet. This may involve eating more salt (sodium) or drinking more water.  Taking medicines to raise your blood pressure.  Changing the dosage of certain medicines you are taking that might be lowering your blood pressure.  Wearing compression stockings. These stockings help to prevent blood clots and reduce swelling in your legs.  In some cases, you may need to go to the hospital for:    Fluid replacement. This means you will receive fluids through an IV tube.  Blood replacement. This means you will receive donated blood through an IV tube (transfusion).  Treating an infection or heart problems, if this applies.  Monitoring. You may need to be monitored while medicines that you  are taking wear off.  Follow these instructions at home: Eating and drinking   Drink enough fluid to keep your urine clear or pale yellow.  Eat a healthy diet and follow instructions from your health care provider about eating or drinking restrictions. A healthy diet includes: ? Fresh fruits and vegetables. ? Whole grains. ? Lean meats. ? Low-fat dairy products.  Eat extra salt only as directed. Do not add extra salt to your diet unless your health care provider told you to do that.  Eat frequent, small meals.  Avoid standing up suddenly after eating. Medicines  Take over-the-counter and prescription medicines only as told by your health care provider. ? Follow instructions from your health care provider about changing the dosage of your current medicines, if this applies. ? Do not stop or adjust any of your medicines on your own. General instructions  Wear compression stockings as told by your health care provider.  Get up slowly from lying down or sitting positions. This gives your blood pressure a chance to adjust.  Avoid hot showers and excessive heat as directed by your health care provider.  Return to your normal activities as told by your health care provider. Ask your health care provider what activities are safe for you.  Do not use any products that contain nicotine or tobacco, such as cigarettes and e-cigarettes. If you need help quitting, ask your health care provider.  Keep all follow-up visits as told by your health care provider. This is important. Contact a health care provider if:  You vomit.  You have diarrhea.  You have a fever for more than 2-3 days.  You feel more thirsty than usual.  You feel weak and tired. Get help right away if:  You have chest pain.  You have a fast or irregular heartbeat.  You develop numbness in any part of your body.  You cannot move your arms or your legs.  You have trouble speaking.  You become sweaty or feel  lightheaded.  You faint.  You feel short of breath.  You have trouble staying awake.  You feel confused. This information is not intended to replace advice given to you by your health care provider. Make sure you discuss any questions you have with your health care provider. Document Released: 05/26/2002 Document Revised: 02/22/2016 Document Reviewed: 11/26/2015 Elsevier Interactive Patient Education  2018 [employer]vier Inc.  

## 2017-02-12 NOTE — Evaluation (Signed)
Physical Therapy Evaluation Patient Details Name: Lance Kemp MRN: [id number] DOB: [birth year] Today's Date: 02/12/2017   History of Present Illness  Lance Kemp is a 83 y.o. male with medical history significant of HTN,TIA,SKIN CANCER presents to the ER today after he woke up the morning complaining of right temporal frontal headache with blurry vision AND lightheadedness and vertigo like symptoms he called his daughter and she called EMS , in the ER he was found to be stable CAT scan is negative and all the labs are within normal limits symptoms resolved with the tramadol because of his age and clinical abilities MRI was recommended to rule out any left or stroke test reason for admission    Clinical Impression  Patient limited for functional mobility and gait secondary to c/o fatigue, BLE weaknesss, poor standing balance, patient required Min assist for transfers, Min assist gait with RW and 2 liters O2 x 40 feet demonstrating slow labored cadence.  Patient will continue to benefit from physical therapy while in hospital suggested venue below.    Follow Up Recommendations Home health PT;Supervision/Assistance - 24 hour    Equipment Recommendations  None recommended by PT    Recommendations for Other Services       Precautions / Restrictions Precautions Precautions: Fall Precaution Comments: patient c/o chronic left shoulder pain Restrictions Weight Bearing Restrictions: No      Mobility  Bed Mobility Overal bed mobility: Needs Assistance Bed Mobility: Supine to Sit;Sit to Supine     Supine to sit: Min guard Sit to supine: Min guard      Transfers Overall transfer level: Needs assistance Equipment used: Rolling walker (2 wheeled) Transfers: Sit to/from [facility] Sit to Stand: Min assist Stand pivot transfers: Min assist          Ambulation/Gait Ambulation/Gait assistance: Min assist Ambulation Distance (Feet): 40 Feet Assistive device:  Rolling walker (2 wheeled) Gait Pattern/deviations: Decreased stride length   Gait velocity interpretation: Below normal speed for age/gender General Gait Details: Demonstrates slow labored cadence with difficulty making turns, limited secondary to c/o fatigue  Stairs            Wheelchair Mobility    Modified Rankin (Stroke Patients Only)       Balance Overall balance assessment: Needs assistance Sitting-balance support: Feet supported Sitting balance-Leahy Scale: Good     Standing balance support: Bilateral upper extremity supported Standing balance-Leahy Scale: Fair                               Pertinent Vitals/Pain Pain Assessment: 0-10 Pain Score: 8  Pain Location: left shoulder Pain Descriptors / Indicators: Dull;Nagging;Throbbing Pain Intervention(s): Limited activity within patient's tolerance;Monitored during session    [place] expects to be discharged to:: Private residence Living Arrangements: Alone (son and daughter-in-law lives acroos the street and visits daily) Available Help at Discharge: Family Type of Home: House Home Access: Ramped entrance     Home Layout: One level Home Equipment: Environmental consultant - 2 wheels;Shower seat - built in Additional Comments: Has caregiver that helps with showers 1x/week    Prior Function Level of Independence: Needs assistance   Gait / Transfers Assistance Needed: household distances with RW d/t balance deficits, aide assists with bathing.   ADL's / Homemaking Assistance Needed: ModI for dressing and toiletting.         Hand Dominance        Extremity/Trunk Assessment   Upper Extremity  Assessment Upper Extremity Assessment: Generalized weakness    Lower Extremity Assessment Lower Extremity Assessment: Generalized weakness    Cervical / Trunk Assessment Cervical / Trunk Assessment: Kyphotic  Communication   Communication: No difficulties  Cognition Arousal/Alertness:  Awake/alert Behavior During Therapy: WFL for tasks assessed/performed Overall Cognitive Status: Within Functional Limits for tasks assessed                                        General Comments      Exercises General Exercises - Lower Extremity Ankle Circles/Pumps: Seated;10 reps;Both Long Arc Quad: Seated;Both;10 reps Hip Flexion/Marching: Seated;Both;10 reps   Assessment/Plan    PT Assessment Patient needs continued PT services  PT Problem List Decreased strength;Decreased activity tolerance;Decreased mobility;Decreased balance       PT Treatment Interventions Gait training;Functional mobility training;Therapeutic activities;Therapeutic exercise;Patient/family education    PT Goals (Current goals can be found in the Care Plan section)  Acute Rehab PT Goals Patient Stated Goal: get stronger so he can walk PT Goal Formulation: With patient Time For Goal Achievement: 02/19/17 Potential to Achieve Goals: Good    Frequency Min 3X/week   Barriers to discharge        Co-evaluation               AM-PAC PT "6 Clicks" Daily Activity  Outcome Measure Difficulty turning over in bed (including adjusting bedclothes, sheets and blankets)?: A Little Difficulty moving from lying on back to sitting on the side of the bed? : A Little Difficulty sitting down on and standing up from a chair with arms (e.g., wheelchair, bedside commode, etc,.)?: A Lot Help needed moving to and from a bed to chair (including a wheelchair)?: A Lot Help needed walking in hospital room?: A Little Help needed climbing 3-5 steps with a railing? : A Lot 6 Click Score: 15    End of Session Equipment Utilized During Treatment: Gait belt;Oxygen Activity Tolerance: Patient limited by fatigue Patient left: in bed;with call bell/phone within reach Nurse Communication: Mobility status PT Visit Diagnosis: Unsteadiness on feet (R26.81);Other abnormalities of gait and mobility (R26.89);Muscle  weakness (generalized) (M62.81)    Time: 7209-4709 PT Time Calculation (min) (ACUTE ONLY): 39 min   Charges:   PT Evaluation $PT Eval Low Complexity: 1 Low PT Treatments $Therapeutic Activity: 8-22 mins   PT G Codes:   PT G-Codes **NOT FOR INPATIENT CLASS** Functional Assessment Tool Used: AM-PAC 6 Clicks Basic Mobility Functional Limitation: Mobility: Walking and moving around Mobility: Walking and Moving Around Current Status (G2836): At least 40 percent but less than 60 percent impaired, limited or restricted Mobility: Walking and Moving Around Goal Status [phone]): At least 40 percent but less than 60 percent impaired, limited or restricted Mobility: Walking and Moving Around Discharge Status [phone]): At least 40 percent but less than 60 percent impaired, limited or restricted    2:44 PM, 02/12/17 Lonell Grandchild, MPT Physical Therapist with [facility] [phone] office [phone] mobile phone

## 2017-02-12 NOTE — Care Management Note (Signed)
Case Management Note  Patient Details  Name: Lance Kemp MRN: [id number] Date of Birth: [birth year]  Subjective/Objective:                  Pt from home, lives alone his son and DIL lives across the street and check on him throughout the day. Pt uses a RW to ambulate. He was recently in [place] and refused [place] until f/u with PCP. That will happen this Thursday per pt's son. PT has again recommended HH and pt/son prefer to wait on PCP's recommendation. Pt son communicates no further needs or concerns.   Action/Plan: DC home with self care and pending PT referral to [facility] from last visit.   Expected Discharge Date:      02/12/2017            Expected Discharge Plan:  Home/Self Care  In-House Referral:  NA  Discharge planning Services  CM Consult  Post Acute Care Choice:  NA Choice offered to:  NA  Status of Service:  Completed, signed off  Sherald Barge, RN 02/12/2017, 3:05 PM

## 2017-02-13 ENCOUNTER — Telehealth: Payer: Self-pay

## 2017-02-13 LAB — URINE CULTURE: Culture: NO GROWTH

## 2017-02-13 NOTE — Telephone Encounter (Signed)
LM requesting call back to complete TCM and confirm hosp f/u appt.  

## 2017-02-13 NOTE — Progress Notes (Signed)
PT left floor via wheelchair. [facility] NT took him down to met his son for transport home. PT stable and pleasant. Pt still very weak and shaky. Son provided information on medications and medications to be taken once home. Son provided DNR sheet. All belonging sent home via son.

## 2017-02-15 NOTE — Telephone Encounter (Signed)
LM requesting call back to complete TCM.

## 2017-02-22 ENCOUNTER — Ambulatory Visit (INDEPENDENT_AMBULATORY_CARE_PROVIDER_SITE_OTHER): Payer: Medicare Other | Admitting: Family Medicine

## 2017-02-22 ENCOUNTER — Encounter: Payer: Self-pay | Admitting: Family Medicine

## 2017-02-22 VITALS — BP 144/78 | HR 62 | Temp 98.3°F | Resp 16 | Ht 67.0 in | Wt 145.2 lb

## 2017-02-22 DIAGNOSIS — R42 Dizziness and giddiness: Secondary | ICD-10-CM

## 2017-02-22 DIAGNOSIS — R296 Repeated falls: Secondary | ICD-10-CM | POA: Diagnosis not present

## 2017-02-22 DIAGNOSIS — R5381 Other malaise: Secondary | ICD-10-CM | POA: Diagnosis not present

## 2017-02-22 DIAGNOSIS — J9811 Atelectasis: Secondary | ICD-10-CM | POA: Diagnosis not present

## 2017-02-22 DIAGNOSIS — I872 Venous insufficiency (chronic) (peripheral): Secondary | ICD-10-CM | POA: Diagnosis not present

## 2017-02-22 DIAGNOSIS — N183 Chronic kidney disease, stage 3 unspecified: Secondary | ICD-10-CM

## 2017-02-22 DIAGNOSIS — I10 Essential (primary) hypertension: Secondary | ICD-10-CM | POA: Diagnosis not present

## 2017-02-22 MED ORDER — FUROSEMIDE 40 MG PO TABS: ORAL_TABLET | ORAL | 3 refills | Status: DC

## 2017-02-22 MED ORDER — FUROSEMIDE 40 MG PO TABS: 20.0000 mg | ORAL_TABLET | Freq: Every day | ORAL | 3 refills | Status: DC

## 2017-02-22 NOTE — Progress Notes (Signed)
02/22/2017  CC:  Chief Complaint  Patient presents with  . Hospitalization Follow-up    TCM    Patient is a 82 y.o. Caucasian male who presents for  hospital follow up, specifically transitional care hosp f/u. Dates hospitalized: 8/26-8/27, 2018. Days since d/c from hospital: 10 Patient was discharged from hospital to home in the care of his son. Reason for admission to hospital: fall suspected to be due to orthostatic syncope. Phone contact by nurse s/p d/c from hospital was attempted 8/28 and 8/30, 2018 and messages were left to return call--these calls are documented in the EMR. No return call was received.  I have reviewed patient's discharge summary plus pertinent specific notes, labs, and imaging from the hospitalization.    Discharge diagnoses: Mechanical fall v/s orthostatic syncope Elevated troponin History of CVA  BPH associated with nocturia Chronic renal insufficiency, stage III  Chronic diastolic heart failure Essential hypertension Hyperlipidemia Mild dementia Normocytic anemia  Legs still feel quite weak.  STill has orthostatic dizziness.  No falls since in hosp 10 d/a. Son got him a transport wheelchair to help safely transport him a lot of the time. He can ambulate with walker but not very steadily and not for long periods.  [place] [facility] comes every Monday to help him in his home with bathing, feeding, helps with some other ADLs. Has same chronic low back pain and L shoulder pain.  No neck pain.  No radiating arm pain or arm paresthesias or weakness.  Legs diffusely weak.  Medication reconciliation was done today and patient is taking meds as recommended by discharging hospitalist/specialist.    PMH:  Past Medical History:  Diagnosis Date  . Basal cell carcinoma 2011; 2016   Face  . BPH with obstruction/lower urinary tract symptoms   . Chronic back pain   . Chronic diastolic heart failure ([place])   . Chronic left shoulder pain 09/14/2013  . Chronic  renal insufficiency, stage III (moderate) 2013   CrCl in the 50s-60s  . Chronic venous insufficiency    Compression hose  . 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  No significant residual deficit except short term memory impairment  . Dementia 2016   Dr. Delice Lesch 08/2014--started aricept but pt did not tolerate.  Exelon trial started by Dr. Delice Lesch 05/2016.  Marland Kitchen Depression   . Frequent headaches   . Glaucoma   . Hypertension   . Normocytic anemia 2015   Secondary to CRI (iron studies ok 12/2013)  . NSTEMI (non-ST elevated myocardial infarction) ([place]) 06/2011   [place]: Troponin mildly elevated, normal EKG, normal ECHO, pt declined cardiology consult--per old PCP records.  Marland Kitchen OAB (overactive bladder)   . Osteoarthritis   . Peripheral neuropathy 2013   Dx'd by neuro in [place] at the time the patient was hospitalized briefly for pontine CVA.  Marland Kitchen Urine incontinence   . Vertigo     PSH:  Past Surgical History:  Procedure Laterality Date  . CHOLECYSTECTOMY    . NM VQ LUNG SCAN ([place] [facility]X)  09/2014   Low risk  . TONSILECTOMY, ADENOIDECTOMY, BILATERAL MYRINGOTOMY AND TUBES    . TRANSTHORACIC ECHOCARDIOGRAM  12/18/13   Grade 1 diast dysf, o/w normal    MEDS:  Outpatient Medications Prior to Visit  Medication Sig Dispense Refill  . aspirin 81 MG chewable tablet Chew 81 mg by mouth daily.    Marland Kitchen atorvastatin (LIPITOR) 20 MG tablet Take 1 tablet (20 mg total) by mouth at bedtime. 30 tablet 5  .  azelastine (OPTIVAR) 0.05 % ophthalmic solution Place 1 drop into both eyes 2 (two) times daily. 6 mL 6  . finasteride (PROSCAR) 5 MG tablet TAKE ONE TABLET BY MOUTH EVERY DAY 30 tablet 5  . hydrALAZINE (APRESOLINE) 10 MG tablet Take 10 mg by mouth 3 (three) times daily.    Marland Kitchen levobunolol (BETAGAN) 0.5 % ophthalmic solution Place 1 drop into both eyes 2 (two) times daily. 5 mL 6  . mirabegron ER (MYRBETRIQ) 25 MG TB24 tablet Take 25 mg by mouth daily.    . Multiple Vitamins-Minerals (ICAPS AREDS 2 PO)  Take 1 capsule by mouth 2 (two) times daily.    . polyethylene glycol powder (GLYCOLAX/MIRALAX) powder Take 17 g by mouth 2 (two) times daily as needed. 3350 g 6  . rivastigmine (EXELON) 1.5 MG capsule TAKE 1 CAPSULE BY MOUTH TWICE DAILY 180 capsule 0  . tamsulosin (FLOMAX) 0.4 MG CAPS capsule TAKE 2 CAPSULES BY MOUTH AT BEDTIME 60 capsule 6   No facility-administered medications prior to visit.    EXAM: BP (!) 144/78 (BP Location: Left Arm, Patient Position: Sitting, Cuff Size: Normal)   Pulse 62   Temp 98.3 F (36.8 C) (Temporal)   Resp 16   Ht 5\' 7"  (1.702 m)   Wt 145 lb 4 oz (65.9 kg)   SpO2 98%   BMI 22.75 kg/m  Gen: alert, oriented x 4. AFFECT: pleasant, lucid thought and speech. CV: RRR, no m/r/g.   LUNGS: CTA bilat except for some dry, atelectatic crackles in early inspiration in the bases bilat, nonlabored resps, good aeration in all lung fields. EXT: no clubbing or cyanosis.  1+ pitting edema bilat, with compression hose on. LE's: strength 4/5 bilat prox/dist.   Pertinent labs/imaging   Chemistry      Component Value Date/Time   NA 139 02/11/2017 1932   K 3.8 02/11/2017 1932   CL 108 02/11/2017 1932   CO2 26 02/11/2017 1932   BUN 15 02/11/2017 1932   CREATININE 0.96 02/11/2017 1932   CREATININE 1.12 (H) 09/24/2015 1447      Component Value Date/Time   CALCIUM 8.5 (L) 02/11/2017 1932   ALKPHOS 69 02/11/2017 1932   AST 25 02/11/2017 1932   ALT 20 02/11/2017 1932   BILITOT 0.8 02/11/2017 1932     Lab Results  Component Value Date   WBC 6.9 02/12/2017   HGB 10.1 (L) 02/12/2017   HCT 29.3 (L) 02/12/2017   MCV 91.3 02/12/2017   PLT 176 02/12/2017   Lab Results  Component Value Date   IRON 45 02/12/2017   TIBC 183 (L) 02/12/2017   FERRITIN 149 02/12/2017   Lab Results  Component Value Date   TROPONINI 0.06 ([place]) 02/12/2017   CT head and cervical spine w/out contrast 02/11/17: IMPRESSION: 1. No definite CT evidence for acute intracranial  abnormality. Advanced atrophy and small vessel ischemic changes of the white matter 2. 3 mm anterolisthesis of C7 on T1, slight progression compared to prior, likely due to degenerative change. MRI follow-up as indicated. Otherwise no fracture is seen. Extensive degenerative changes of the cervical spine.  MRI C spine w/out contrast 02/12/17: IMPRESSION: 1. Marked edema along the C7-T1 endplate on the left, within the left-sided facet joint and in the adjacent soft tissue suggesting acute on chronic subluxation without definite fracture on the CT scan. Nondisplaced fracture is considered. Significant progression on CT from the prior CT scan suggests a significant chronic component to this process. 2. Moderate central and bilateral  foraminal stenosis at C7-T1. 3. No other acute osseous abnormality. 4. Multilevel spondylosis of the cervical spine as described. 5. Fusion at C4-5 is likely degenerative   ASSESSMENT/PLAN:  1) Recurrent falls; related to combination of chronic debilitation/LE weakness + orthostatic dizziness/presyncope.  Discussed behavioral habit of getting up slowly and standing a few seconds and wait for dizziness to resolve BEFORE he starts to walk. Order HH PT--advanced. He is moving towards needing a motorized WC, but he is not willing to accept this fact at this time.  2) C spine spondylosis with spondylolisthesis: mildly progressed by CT scan appearance. Fortunately he is not having any neck pain at this time.  3) Chronic LE venous insufficiency edema: in light of his minimal edema lately and his problem above (#1), will decrease this to 1/2 of 40 mg tab every other day.  4) HTN: bp up a little today.  Son is going to buy home bp cuff and start monitoring regularly at home. No med changes for this today.  5) BPH with LUTS: continue proscar and flomax.  D/c mirbetriq due to cost.  6) Atelectasis: suspected by his lung sounds today. Incentive spirometer rx'd  today.  An After Visit Summary was printed and given to the patient.  Medical decision making of moderate complexity was utilized today.  FOLLOW UP:  4 weeks  Signed:  Crissie Sickles, MD           02/22/2017

## 2017-02-26 DIAGNOSIS — G629 Polyneuropathy, unspecified: Secondary | ICD-10-CM | POA: Diagnosis not present

## 2017-02-26 DIAGNOSIS — I5033 Acute on chronic diastolic (congestive) heart failure: Secondary | ICD-10-CM | POA: Diagnosis not present

## 2017-02-26 DIAGNOSIS — D631 Anemia in chronic kidney disease: Secondary | ICD-10-CM | POA: Diagnosis not present

## 2017-02-26 DIAGNOSIS — F329 Major depressive disorder, single episode, unspecified: Secondary | ICD-10-CM | POA: Diagnosis not present

## 2017-02-26 DIAGNOSIS — E785 Hyperlipidemia, unspecified: Secondary | ICD-10-CM | POA: Diagnosis not present

## 2017-02-26 DIAGNOSIS — I130 Hypertensive heart and chronic kidney disease with heart failure and stage 1 through stage 4 chronic kidney disease, or unspecified chronic kidney disease: Secondary | ICD-10-CM | POA: Diagnosis not present

## 2017-02-26 DIAGNOSIS — F0390 Unspecified dementia without behavioral disturbance: Secondary | ICD-10-CM | POA: Diagnosis not present

## 2017-02-26 DIAGNOSIS — I872 Venous insufficiency (chronic) (peripheral): Secondary | ICD-10-CM | POA: Diagnosis not present

## 2017-02-26 DIAGNOSIS — Z8673 Personal history of transient ischemic attack (TIA), and cerebral infarction without residual deficits: Secondary | ICD-10-CM | POA: Diagnosis not present

## 2017-02-26 DIAGNOSIS — M1990 Unspecified osteoarthritis, unspecified site: Secondary | ICD-10-CM | POA: Diagnosis not present

## 2017-02-26 DIAGNOSIS — N400 Enlarged prostate without lower urinary tract symptoms: Secondary | ICD-10-CM | POA: Diagnosis not present

## 2017-02-26 DIAGNOSIS — N183 Chronic kidney disease, stage 3 (moderate): Secondary | ICD-10-CM | POA: Diagnosis not present

## 2017-02-26 DIAGNOSIS — Z7982 Long term (current) use of aspirin: Secondary | ICD-10-CM | POA: Diagnosis not present

## 2017-02-26 DIAGNOSIS — R296 Repeated falls: Secondary | ICD-10-CM | POA: Diagnosis not present

## 2017-02-28 DIAGNOSIS — I872 Venous insufficiency (chronic) (peripheral): Secondary | ICD-10-CM | POA: Diagnosis not present

## 2017-02-28 DIAGNOSIS — D631 Anemia in chronic kidney disease: Secondary | ICD-10-CM | POA: Diagnosis not present

## 2017-02-28 DIAGNOSIS — I5033 Acute on chronic diastolic (congestive) heart failure: Secondary | ICD-10-CM | POA: Diagnosis not present

## 2017-02-28 DIAGNOSIS — R296 Repeated falls: Secondary | ICD-10-CM | POA: Diagnosis not present

## 2017-02-28 DIAGNOSIS — N183 Chronic kidney disease, stage 3 (moderate): Secondary | ICD-10-CM | POA: Diagnosis not present

## 2017-02-28 DIAGNOSIS — I130 Hypertensive heart and chronic kidney disease with heart failure and stage 1 through stage 4 chronic kidney disease, or unspecified chronic kidney disease: Secondary | ICD-10-CM | POA: Diagnosis not present

## 2017-03-01 DIAGNOSIS — I5033 Acute on chronic diastolic (congestive) heart failure: Secondary | ICD-10-CM | POA: Diagnosis not present

## 2017-03-01 DIAGNOSIS — D631 Anemia in chronic kidney disease: Secondary | ICD-10-CM | POA: Diagnosis not present

## 2017-03-01 DIAGNOSIS — R296 Repeated falls: Secondary | ICD-10-CM | POA: Diagnosis not present

## 2017-03-01 DIAGNOSIS — I872 Venous insufficiency (chronic) (peripheral): Secondary | ICD-10-CM | POA: Diagnosis not present

## 2017-03-01 DIAGNOSIS — I130 Hypertensive heart and chronic kidney disease with heart failure and stage 1 through stage 4 chronic kidney disease, or unspecified chronic kidney disease: Secondary | ICD-10-CM | POA: Diagnosis not present

## 2017-03-01 DIAGNOSIS — N183 Chronic kidney disease, stage 3 (moderate): Secondary | ICD-10-CM | POA: Diagnosis not present

## 2017-03-06 DIAGNOSIS — N183 Chronic kidney disease, stage 3 (moderate): Secondary | ICD-10-CM | POA: Diagnosis not present

## 2017-03-06 DIAGNOSIS — I130 Hypertensive heart and chronic kidney disease with heart failure and stage 1 through stage 4 chronic kidney disease, or unspecified chronic kidney disease: Secondary | ICD-10-CM | POA: Diagnosis not present

## 2017-03-06 DIAGNOSIS — D631 Anemia in chronic kidney disease: Secondary | ICD-10-CM | POA: Diagnosis not present

## 2017-03-06 DIAGNOSIS — I5033 Acute on chronic diastolic (congestive) heart failure: Secondary | ICD-10-CM | POA: Diagnosis not present

## 2017-03-06 DIAGNOSIS — R296 Repeated falls: Secondary | ICD-10-CM | POA: Diagnosis not present

## 2017-03-06 DIAGNOSIS — I872 Venous insufficiency (chronic) (peripheral): Secondary | ICD-10-CM | POA: Diagnosis not present

## 2017-03-07 DIAGNOSIS — R296 Repeated falls: Secondary | ICD-10-CM | POA: Diagnosis not present

## 2017-03-07 DIAGNOSIS — I872 Venous insufficiency (chronic) (peripheral): Secondary | ICD-10-CM | POA: Diagnosis not present

## 2017-03-07 DIAGNOSIS — D631 Anemia in chronic kidney disease: Secondary | ICD-10-CM | POA: Diagnosis not present

## 2017-03-07 DIAGNOSIS — N183 Chronic kidney disease, stage 3 (moderate): Secondary | ICD-10-CM | POA: Diagnosis not present

## 2017-03-07 DIAGNOSIS — I5033 Acute on chronic diastolic (congestive) heart failure: Secondary | ICD-10-CM | POA: Diagnosis not present

## 2017-03-07 DIAGNOSIS — I130 Hypertensive heart and chronic kidney disease with heart failure and stage 1 through stage 4 chronic kidney disease, or unspecified chronic kidney disease: Secondary | ICD-10-CM | POA: Diagnosis not present

## 2017-03-08 DIAGNOSIS — I130 Hypertensive heart and chronic kidney disease with heart failure and stage 1 through stage 4 chronic kidney disease, or unspecified chronic kidney disease: Secondary | ICD-10-CM | POA: Diagnosis not present

## 2017-03-08 DIAGNOSIS — I872 Venous insufficiency (chronic) (peripheral): Secondary | ICD-10-CM | POA: Diagnosis not present

## 2017-03-08 DIAGNOSIS — R296 Repeated falls: Secondary | ICD-10-CM | POA: Diagnosis not present

## 2017-03-08 DIAGNOSIS — N183 Chronic kidney disease, stage 3 (moderate): Secondary | ICD-10-CM | POA: Diagnosis not present

## 2017-03-08 DIAGNOSIS — D631 Anemia in chronic kidney disease: Secondary | ICD-10-CM | POA: Diagnosis not present

## 2017-03-08 DIAGNOSIS — I5033 Acute on chronic diastolic (congestive) heart failure: Secondary | ICD-10-CM | POA: Diagnosis not present

## 2017-03-09 DIAGNOSIS — I130 Hypertensive heart and chronic kidney disease with heart failure and stage 1 through stage 4 chronic kidney disease, or unspecified chronic kidney disease: Secondary | ICD-10-CM | POA: Diagnosis not present

## 2017-03-09 DIAGNOSIS — N183 Chronic kidney disease, stage 3 (moderate): Secondary | ICD-10-CM | POA: Diagnosis not present

## 2017-03-09 DIAGNOSIS — I5033 Acute on chronic diastolic (congestive) heart failure: Secondary | ICD-10-CM | POA: Diagnosis not present

## 2017-03-12 DIAGNOSIS — D631 Anemia in chronic kidney disease: Secondary | ICD-10-CM | POA: Diagnosis not present

## 2017-03-12 DIAGNOSIS — I872 Venous insufficiency (chronic) (peripheral): Secondary | ICD-10-CM | POA: Diagnosis not present

## 2017-03-12 DIAGNOSIS — R296 Repeated falls: Secondary | ICD-10-CM | POA: Diagnosis not present

## 2017-03-12 DIAGNOSIS — I130 Hypertensive heart and chronic kidney disease with heart failure and stage 1 through stage 4 chronic kidney disease, or unspecified chronic kidney disease: Secondary | ICD-10-CM | POA: Diagnosis not present

## 2017-03-12 DIAGNOSIS — I5033 Acute on chronic diastolic (congestive) heart failure: Secondary | ICD-10-CM | POA: Diagnosis not present

## 2017-03-12 DIAGNOSIS — N183 Chronic kidney disease, stage 3 (moderate): Secondary | ICD-10-CM | POA: Diagnosis not present

## 2017-03-14 ENCOUNTER — Ambulatory Visit: Payer: Medicare Other | Admitting: Neurology

## 2017-03-14 DIAGNOSIS — R296 Repeated falls: Secondary | ICD-10-CM | POA: Diagnosis not present

## 2017-03-14 DIAGNOSIS — D631 Anemia in chronic kidney disease: Secondary | ICD-10-CM | POA: Diagnosis not present

## 2017-03-14 DIAGNOSIS — I130 Hypertensive heart and chronic kidney disease with heart failure and stage 1 through stage 4 chronic kidney disease, or unspecified chronic kidney disease: Secondary | ICD-10-CM | POA: Diagnosis not present

## 2017-03-14 DIAGNOSIS — I872 Venous insufficiency (chronic) (peripheral): Secondary | ICD-10-CM | POA: Diagnosis not present

## 2017-03-14 DIAGNOSIS — I5033 Acute on chronic diastolic (congestive) heart failure: Secondary | ICD-10-CM | POA: Diagnosis not present

## 2017-03-14 DIAGNOSIS — N183 Chronic kidney disease, stage 3 (moderate): Secondary | ICD-10-CM | POA: Diagnosis not present

## 2017-03-15 DIAGNOSIS — D631 Anemia in chronic kidney disease: Secondary | ICD-10-CM | POA: Diagnosis not present

## 2017-03-15 DIAGNOSIS — N183 Chronic kidney disease, stage 3 (moderate): Secondary | ICD-10-CM | POA: Diagnosis not present

## 2017-03-15 DIAGNOSIS — R296 Repeated falls: Secondary | ICD-10-CM | POA: Diagnosis not present

## 2017-03-15 DIAGNOSIS — I872 Venous insufficiency (chronic) (peripheral): Secondary | ICD-10-CM | POA: Diagnosis not present

## 2017-03-15 DIAGNOSIS — I5033 Acute on chronic diastolic (congestive) heart failure: Secondary | ICD-10-CM | POA: Diagnosis not present

## 2017-03-15 DIAGNOSIS — I130 Hypertensive heart and chronic kidney disease with heart failure and stage 1 through stage 4 chronic kidney disease, or unspecified chronic kidney disease: Secondary | ICD-10-CM | POA: Diagnosis not present

## 2017-03-19 ENCOUNTER — Encounter (HOSPITAL_COMMUNITY): Payer: Self-pay | Admitting: Emergency Medicine

## 2017-03-19 ENCOUNTER — Emergency Department (HOSPITAL_COMMUNITY)
Admission: EM | Admit: 2017-03-19 | Discharge: 2017-03-19 | Disposition: A | Discharge status: Home / Self Care | Payer: Medicare Other | Attending: Emergency Medicine | Admitting: Emergency Medicine

## 2017-03-19 DIAGNOSIS — I130 Hypertensive heart and chronic kidney disease with heart failure and stage 1 through stage 4 chronic kidney disease, or unspecified chronic kidney disease: Secondary | ICD-10-CM | POA: Diagnosis not present

## 2017-03-19 DIAGNOSIS — R42 Dizziness and giddiness: Secondary | ICD-10-CM | POA: Diagnosis not present

## 2017-03-19 DIAGNOSIS — Z85828 Personal history of other malignant neoplasm of skin: Secondary | ICD-10-CM | POA: Diagnosis not present

## 2017-03-19 DIAGNOSIS — I5032 Chronic diastolic (congestive) heart failure: Secondary | ICD-10-CM | POA: Insufficient documentation

## 2017-03-19 DIAGNOSIS — N183 Chronic kidney disease, stage 3 (moderate): Secondary | ICD-10-CM | POA: Insufficient documentation

## 2017-03-19 DIAGNOSIS — R531 Weakness: Secondary | ICD-10-CM | POA: Diagnosis not present

## 2017-03-19 DIAGNOSIS — R404 Transient alteration of awareness: Secondary | ICD-10-CM | POA: Diagnosis not present

## 2017-03-19 DIAGNOSIS — Z79899 Other long term (current) drug therapy: Secondary | ICD-10-CM | POA: Insufficient documentation

## 2017-03-19 DIAGNOSIS — Z7982 Long term (current) use of aspirin: Secondary | ICD-10-CM | POA: Diagnosis not present

## 2017-03-19 DIAGNOSIS — Z87891 Personal history of nicotine dependence: Secondary | ICD-10-CM | POA: Insufficient documentation

## 2017-03-19 LAB — COMPREHENSIVE METABOLIC PANEL
ALBUMIN: 3.3 g/dL — AB (ref 3.5–5.0)
ALK PHOS: 84 U/L (ref 38–126)
ALT: 31 U/L (ref 17–63)
AST: 31 U/L (ref 15–41)
Anion gap: 10 (ref 5–15)
BILIRUBIN TOTAL: 0.8 mg/dL (ref 0.3–1.2)
BUN: 15 mg/dL (ref 6–20)
CALCIUM: 8.7 mg/dL — AB (ref 8.9–10.3)
CO2: 26 mmol/L (ref 22–32)
CREATININE: 0.89 mg/dL (ref 0.61–1.24)
Chloride: 103 mmol/L (ref 101–111)
GFR calc Af Amer: >60 mL/min (ref >60)
GLUCOSE: 94 mg/dL (ref 65–99)
Potassium: 3.7 mmol/L (ref 3.5–5.1)
Sodium: 139 mmol/L (ref 135–145)
TOTAL PROTEIN: 6.1 g/dL — AB (ref 6.5–8.1)

## 2017-03-19 LAB — URINALYSIS, ROUTINE W REFLEX MICROSCOPIC
BILIRUBIN URINE: NEGATIVE
GLUCOSE, UA: NEGATIVE mg/dL
HGB URINE DIPSTICK: NEGATIVE
KETONES UR: NEGATIVE mg/dL
Leukocytes, UA: NEGATIVE
Nitrite: NEGATIVE
PH: 8 (ref 5.0–8.0)
PROTEIN: NEGATIVE mg/dL
Specific Gravity, Urine: 1.008 (ref 1.005–1.030)

## 2017-03-19 LAB — TROPONIN I: Troponin I: <0.03 ng/mL (ref <0.03)

## 2017-03-19 LAB — CBC WITH DIFFERENTIAL/PLATELET
BASOS ABS: 0.1 10*3/uL (ref 0.0–0.1)
BASOS PCT: 1 %
Eosinophils Absolute: 0.3 10*3/uL (ref 0.0–0.7)
Eosinophils Relative: 4 %
HEMATOCRIT: 35.4 % — AB (ref 39.0–52.0)
HEMOGLOBIN: 11.9 g/dL — AB (ref 13.0–17.0)
LYMPHS PCT: 22 %
Lymphs Abs: 1.6 10*3/uL (ref 0.7–4.0)
MCH: 31.2 pg (ref 26.0–34.0)
MCHC: 33.6 g/dL (ref 30.0–36.0)
MCV: 92.9 fL (ref 78.0–100.0)
MONOS PCT: 11 %
Monocytes Absolute: 0.8 10*3/uL (ref 0.1–1.0)
NEUTROS ABS: 4.4 10*3/uL (ref 1.7–7.7)
NEUTROS PCT: 62 %
Platelets: 185 10*3/uL (ref 150–400)
RBC: 3.81 MIL/uL — ABNORMAL LOW (ref 4.22–5.81)
RDW: 13.1 % (ref 11.5–15.5)
WBC: 7.1 10*3/uL (ref 4.0–10.5)

## 2017-03-19 NOTE — ED Provider Notes (Signed)
[facility] DEPT Provider Note   CSN: [id number] Arrival date & time: 03/19/17  1246     History   Chief Complaint Chief Complaint  Patient presents with  . Weakness    HPI Jaquawn Saffran is a 83 y.o. male. Chief complaint is weak weakness, resolved.  HPI 83 year old male. Had an episode this morning. He was waiting for his cleaning lady get there. She also helps him shower, and perform simple ADLs at home. He states that he felt a funny spinning in his head for just a minute or so. States he was afraid to stand for fear that he might fall. He states his legs syndrome and rubbery for the last few months. He's been evaluated in the emergency room, and by his physician for this. He uses a "rolling walker that looks like a shopping cart".  He is asymptomatic here. He states "was wondering if I could just go back home".  No chest pain. No near syncope. No frank palpitations. No neurological symptoms with numbness weakness vision changes trouble swallowing trouble speaking.  Past Medical History:  Diagnosis Date  . Basal cell carcinoma 2011; 2016   Face  . BPH with obstruction/lower urinary tract symptoms   . Chronic back pain   . Chronic diastolic heart failure ([place])   . Chronic left shoulder pain 09/14/2013  . Chronic renal insufficiency, stage III (moderate) (HCC) 2013   CrCl in the 50s-60s  . Chronic venous insufficiency    Compression hose  . 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  No significant residual deficit except short term memory impairment  . Dementia 2016   Dr. Delice Lesch 08/2014--started aricept but pt did not tolerate.  Exelon trial started by Dr. Delice Lesch 05/2016.  Marland Kitchen Depression   . Frequent headaches   . Glaucoma   . Hypertension   . Normocytic anemia 2015   Secondary to CRI (iron studies ok 12/2013)  . NSTEMI (non-ST elevated myocardial infarction) ([place]) 06/2011   [place]: Troponin mildly elevated, normal EKG, normal ECHO, pt declined cardiology  consult--per old PCP records.  Marland Kitchen OAB (overactive bladder)   . Osteoarthritis   . Peripheral neuropathy 2013   Dx'd by neuro in [place] at the time the patient was hospitalized briefly for pontine CVA.  Marland Kitchen Urine incontinence   . Vertigo     Patient Active Problem List   Diagnosis Date Noted  . Vertigo 02/04/2017  . Cellulitis of left lower extremity 04/25/2016  . Cellulitis 04/25/2016  . Basal cell carcinoma, face 04/07/2015  . Mild dementia 11/24/2014  . OAB (overactive bladder) 10/17/2014  . Essential hypertension 08/25/2014  . Hyperlipidemia 08/25/2014  . Peripheral edema 04/16/2014  . Chronic diastolic heart failure ([place]) 03/04/2014  . Chronic renal insufficiency, stage III (moderate) (HCC)   . Chronic shoulder pain 02/19/2014  . SOB (shortness of breath) 02/18/2014  . Acute on chronic diastolic heart failure ([place]) 12/24/2013  . Lumbago 12/18/2013  . History of CVA (cerebrovascular accident) 11/21/2013  . BPH associated with nocturia 11/21/2013  . Short-term memory loss 10/24/2013  . Chronic left shoulder pain 09/14/2013  . Venous insufficiency of both lower extremities 09/14/2013  . HTN (hypertension), benign 09/14/2013  . Normocytic anemia 06/19/2013    Past Surgical History:  Procedure Laterality Date  . CHOLECYSTECTOMY    . NM VQ LUNG SCAN ([place] [facility]X)  09/2014   Low risk  . TONSILECTOMY, ADENOIDECTOMY, BILATERAL MYRINGOTOMY AND TUBES    . TRANSTHORACIC ECHOCARDIOGRAM  12/18/13   Grade 1 diast  dysf, o/w normal       Home Medications    Prior to Admission medications   Medication Sig Start Date End Date Taking? Authorizing Provider  aspirin 81 MG chewable tablet Chew 81 mg by mouth daily.   Yes [provider]  atorvastatin (LIPITOR) 20 MG tablet Take 1 tablet (20 mg total) by mouth at bedtime. 01/25/17  Yes McGowen, [name], MD  finasteride (PROSCAR) 5 MG tablet TAKE ONE TABLET BY MOUTH EVERY DAY 01/25/17  Yes McGowen, [name], MD  hydrALAZINE  (APRESOLINE) 10 MG tablet Take 10 mg by mouth 3 (three) times daily.   Yes [provider]  levobunolol (BETAGAN) 0.5 % ophthalmic solution Place 1 drop into both eyes 2 (two) times daily. 06/21/16  Yes McGowen, [name], MD  mirabegron ER (MYRBETRIQ) 25 MG TB24 tablet Take 25 mg by mouth daily.   Yes [provider]  Multiple Vitamins-Minerals (ICAPS AREDS 2 PO) Take 1 capsule by mouth 2 (two) times daily.   Yes [provider]  polyethylene glycol powder (GLYCOLAX/MIRALAX) powder Take 17 g by mouth 2 (two) times daily as needed. 06/21/16  Yes McGowen, [name], MD  rivastigmine (EXELON) 1.5 MG capsule TAKE 1 CAPSULE BY MOUTH TWICE DAILY 12/26/16  Yes Cameron Sprang, MD  tamsulosin (FLOMAX) 0.4 MG CAPS capsule TAKE 2 CAPSULES BY MOUTH AT BEDTIME 09/08/16  Yes McGowen, [name], MD  furosemide (LASIX) 40 MG tablet 1/2 tab po qod Patient not taking: Reported on 03/19/2017 02/22/17   McGowen, [name], MD    Family History Family History  Problem Relation Age of Onset  . Cancer Mother   . Heart disease Mother     Social History Social History  Substance Use Topics  . Smoking status: Former Smoker    Types: Pipe  . Smokeless tobacco: Never Used  . Alcohol use No     Allergies   Altace [ramipril]; Amoxicillin; Cozaar [losartan potassium]; and Tramadol   Review of Systems Review of Systems  Constitutional: Negative for appetite change, chills, diaphoresis, fatigue and fever.  HENT: Negative for mouth sores, sore throat and trouble swallowing.   Eyes: Negative for visual disturbance.  Respiratory: Negative for cough, chest tightness, shortness of breath and wheezing.   Cardiovascular: Negative for chest pain.  Gastrointestinal: Negative for abdominal distention, abdominal pain, diarrhea, nausea and vomiting.  Endocrine: Negative for polydipsia, polyphagia and polyuria.  Genitourinary: Negative for dysuria, frequency and hematuria.  Musculoskeletal: Negative for  gait problem.  Skin: Negative for color change, pallor and rash.  Neurological: Positive for dizziness and weakness. Negative for syncope, light-headedness and headaches.  Hematological: Does not bruise/bleed easily.  Psychiatric/Behavioral: Negative for behavioral problems and confusion.     Physical Exam Updated Vital Signs BP (!) 168/80   Pulse 62   Temp 98.2 F (36.8 C) (Oral)   Resp 15   Ht 5\' 7"  (1.702 m)   Wt 65.8 kg (145 lb)   SpO2 100%   BMI 22.71 kg/m   Physical Exam  Constitutional: He is oriented to person, place, and time. He appears well-developed and well-nourished. No distress.  HENT:  Head: Normocephalic.  Eyes: Pupils are equal, round, and reactive to light. Conjunctivae are normal. No scleral icterus.  Neck: Normal range of motion. Neck supple. No thyromegaly present.  Cardiovascular: Normal rate and regular rhythm.  Exam reveals no gallop and no friction rub.   No murmur heard. Pulmonary/Chest: Effort normal and breath sounds normal. No respiratory distress.  He has no wheezes. He has no rales.  Abdominal: Soft. Bowel sounds are normal. He exhibits no distension. There is no tenderness. There is no rebound.  Musculoskeletal: Normal range of motion.  Neurological: He is alert and oriented to person, place, and time.  Skin: Skin is warm and dry. No rash noted.  Psychiatric: He has a normal mood and affect. His behavior is normal.     ED Treatments / Results  Labs (all labs ordered are listed, but only abnormal results are displayed) Labs Reviewed  CBC WITH DIFFERENTIAL/PLATELET - Abnormal; Notable for the following:       Result Value   RBC 3.81 (*)    Hemoglobin 11.9 (*)    HCT 35.4 (*)    All other components within normal limits  COMPREHENSIVE METABOLIC PANEL - Abnormal; Notable for the following:    Calcium 8.7 (*)    Total Protein 6.1 (*)    Albumin 3.3 (*)    All other components within normal limits  TROPONIN I  URINALYSIS, ROUTINE W  REFLEX MICROSCOPIC    EKG  EKG Interpretation  Date/Time:  Monday March 19 2017 12:49:20 EDT Ventricular Rate:  62 PR Interval:    QRS Duration: 98 QT Interval:  484 QTC Calculation: 492 R Axis:   -19 Text Interpretation:  Sinus rhythm Supraventricular bigeminy Abnormal R-wave progression, late transition Left ventricular hypertrophy Nonspecific T abnormalities, lateral leads Borderline prolonged QT interval Confirmed by Tanna Furry [phone]) on 03/19/2017 1:24:05 PM       Radiology No results found.  Procedures Procedures (including critical care time)  Medications Ordered in ED Medications - No data to display   Initial Impression / Assessment and Plan / ED Course  I have reviewed the triage vital signs and the nursing notes.  Pertinent labs & imaging results that were available during my care of the patient were reviewed by me and considered in my medical decision making (see chart for details).    Reassuring evaluation. He remains a symptomatically here. He states these episodes happened him a few times per year. This does not select syncope or presyncope. No concern for ACS. Urine does not appear infected. His vital signs are reassuring. Well oxygenated and afebrile. Appropriate for discharge home. He will return with any new or recurrence of symptoms.  Final Clinical Impressions(s) / ED Diagnoses   Final diagnoses:  Weakness    New Prescriptions New Prescriptions   No medications on file     Tanna Furry, MD 03/19/17 1628

## 2017-03-19 NOTE — Discharge Instructions (Signed)
Your evaluation today was normal after your episode this morning. Follow up with your physician as needed.

## 2017-03-19 NOTE — ED Notes (Signed)
Pt.'s son is here to pick pt up. Pt has been sitting in hallway in NAD. Has already signed to be Discharged

## 2017-03-19 NOTE — ED Triage Notes (Signed)
PT states he has had generalized weakness, SOB on exertion and headache over the past 3 days. PT alert and oriented on arrival to ED.

## 2017-03-19 NOTE — ED Notes (Signed)
Have called pt.'s son to come pick him up. Stated he would be able to come pick around 8:30 pm. Will get pt a dinner tray.

## 2017-03-21 ENCOUNTER — Encounter: Payer: Self-pay | Admitting: Family Medicine

## 2017-03-21 ENCOUNTER — Ambulatory Visit (HOSPITAL_BASED_OUTPATIENT_CLINIC_OR_DEPARTMENT_OTHER)
Admission: RE | Admit: 2017-03-21 | Discharge: 2017-03-21 | Disposition: A | Discharge status: Home / Self Care | Payer: Medicare Other | Source: Ambulatory Visit | Attending: Family Medicine | Admitting: Family Medicine

## 2017-03-21 ENCOUNTER — Ambulatory Visit (INDEPENDENT_AMBULATORY_CARE_PROVIDER_SITE_OTHER): Payer: Medicare Other | Admitting: Family Medicine

## 2017-03-21 VITALS — BP 129/72 | HR 67 | Temp 97.7°F | Resp 16 | Ht 67.0 in | Wt 139.0 lb

## 2017-03-21 DIAGNOSIS — R0602 Shortness of breath: Secondary | ICD-10-CM

## 2017-03-21 DIAGNOSIS — F411 Generalized anxiety disorder: Secondary | ICD-10-CM | POA: Diagnosis not present

## 2017-03-21 DIAGNOSIS — R42 Dizziness and giddiness: Secondary | ICD-10-CM | POA: Diagnosis not present

## 2017-03-21 DIAGNOSIS — R600 Localized edema: Secondary | ICD-10-CM | POA: Diagnosis not present

## 2017-03-21 DIAGNOSIS — I10 Essential (primary) hypertension: Secondary | ICD-10-CM

## 2017-03-21 DIAGNOSIS — F410 Panic disorder [episodic paroxysmal anxiety] without agoraphobia: Secondary | ICD-10-CM | POA: Diagnosis not present

## 2017-03-21 DIAGNOSIS — R110 Nausea: Secondary | ICD-10-CM | POA: Diagnosis not present

## 2017-03-21 DIAGNOSIS — K219 Gastro-esophageal reflux disease without esophagitis: Secondary | ICD-10-CM

## 2017-03-21 MED ORDER — OMEPRAZOLE 40 MG PO CPDR: 40.0000 mg | DELAYED_RELEASE_CAPSULE | Freq: Every day | ORAL | 3 refills | Status: DC

## 2017-03-21 MED ORDER — ESCITALOPRAM OXALATE 5 MG PO TABS: 5.0000 mg | ORAL_TABLET | Freq: Every day | ORAL | 0 refills | Status: DC

## 2017-03-21 NOTE — Progress Notes (Signed)
OFFICE VISIT  03/21/2017   CC:  Chief Complaint  Patient presents with  . Follow-up    RCI   HPI:    Patient is a 83 y.o. Caucasian male who presents for 1 mo f/u recurrent falls with orthostatic dizziness, chronic LE venous insufficiency edema, and HTN. Went to ED 03/19/17 for dizziness and generalized weakness, sx's had resolved by the time he got there.  Eval was good/unremarkable. Reviewed ED records today.  That day, [place] [facility] called 911 b/c pt was "really sick and shaking real bad, SOB".   He did not lose consciousness or fall.  Felt nauseated, said head was light.  Denies vertigo. He was d/c'd from ED still feeling some nausea and lightheadedness.  Says he felt normal after getting home. Of note, in ED his L forearm got a large ecchymoses when bp cuff was on L arm and they moved him in bed. This aches some.   Yesterday was a day of more rest, feeling at baseline.  Says he has a vague emptiness feeling in stomach, but not nausea.  No dizziness currently.  He last ate at [facility] time around a few hours ago Usually not eating well in middle of day except consistently drinks ensure.  No nausea med.  +Indigestion, substernal burning, GERD. Denies any CP or pain anywhere else currently.  Denies numbness or tingling anywhere.  His son says it looked like he was having a panic attack.  Says pt is very anxious about getting into bath/shower. Son says he has not been giving pt the hydralazine since last visit with me.  Has new bp cuff but no monitoring yet.  ROS: nasal congestion and cough in morning---chronic.  Chronic anxiety.  Legs swelling has been stable.    Past Medical History:  Diagnosis Date  . Basal cell carcinoma 2011; 2016   Face  . BPH with obstruction/lower urinary tract symptoms   . Chronic back pain   . Chronic diastolic heart failure ([place])   . Chronic left shoulder pain 09/14/2013  . Chronic renal insufficiency, stage III (moderate) (HCC) 2013   CrCl in the 50s-60s  .  Chronic venous insufficiency    Compression hose  . 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  No significant residual deficit except short term memory impairment  . Dementia 2016   Dr. Delice Lesch 08/2014--started aricept but pt did not tolerate.  Exelon trial started by Dr. Delice Lesch 05/2016.  Marland Kitchen Depression   . Frequent headaches   . Glaucoma   . Hypertension   . Normocytic anemia 2015   Secondary to CRI (iron studies ok 12/2013)  . NSTEMI (non-ST elevated myocardial infarction) ([place]) 06/2011   [place]: Troponin mildly elevated, normal EKG, normal ECHO, pt declined cardiology consult--per old PCP records.  Marland Kitchen OAB (overactive bladder)   . Osteoarthritis   . Peripheral neuropathy 2013   Dx'd by neuro in [place] at the time the patient was hospitalized briefly for pontine CVA.  Marland Kitchen Urine incontinence   . Vertigo     Past Surgical History:  Procedure Laterality Date  . CHOLECYSTECTOMY    . NM VQ LUNG SCAN ([place] [facility]X)  09/2014   Low risk  . TONSILECTOMY, ADENOIDECTOMY, BILATERAL MYRINGOTOMY AND TUBES    . TRANSTHORACIC ECHOCARDIOGRAM  12/18/13   Grade 1 diast dysf, o/w normal    Outpatient Medications Prior to Visit  Medication Sig Dispense Refill  . aspirin 81 MG chewable tablet Chew 81 mg by mouth daily.    Marland Kitchen  atorvastatin (LIPITOR) 20 MG tablet Take 1 tablet (20 mg total) by mouth at bedtime. 30 tablet 5  . finasteride (PROSCAR) 5 MG tablet TAKE ONE TABLET BY MOUTH EVERY DAY 30 tablet 5  . furosemide (LASIX) 40 MG tablet 1/2 tab po qod 30 tablet 3  . levobunolol (BETAGAN) 0.5 % ophthalmic solution Place 1 drop into both eyes 2 (two) times daily. 5 mL 6  . Multiple Vitamins-Minerals (ICAPS AREDS 2 PO) Take 1 capsule by mouth 2 (two) times daily.    . polyethylene glycol powder (GLYCOLAX/MIRALAX) powder Take 17 g by mouth 2 (two) times daily as needed. 3350 g 6  . rivastigmine (EXELON) 1.5 MG capsule TAKE 1 CAPSULE BY MOUTH TWICE DAILY 180 capsule 0  . tamsulosin (FLOMAX) 0.4 MG CAPS capsule  TAKE 2 CAPSULES BY MOUTH AT BEDTIME 60 capsule 6  . hydrALAZINE (APRESOLINE) 10 MG tablet Take 10 mg by mouth 3 (three) times daily.    . mirabegron ER (MYRBETRIQ) 25 MG TB24 tablet Take 25 mg by mouth daily.     No facility-administered medications prior to visit.   Not taking myrbetriq as listed above.  Not taking hydralazine as listed above.  Allergies  Allergen Reactions  . Altace [Ramipril] Other (See Comments)    Reaction:  Unknown   . Amoxicillin Other (See Comments)    Reaction:  Unknown  Has patient had a PCN reaction causing immediate rash, facial/tongue/throat swelling, SOB or lightheadedness with hypotension: Unsure Has patient had a PCN reaction causing severe rash involving mucus membranes or skin necrosis: Unsure Has patient had a PCN reaction that required hospitalization Unsure Has patient had a PCN reaction occurring within the last 10 years: Unsure If all of the above answers are "NO", then may proceed with Cephalosporin use.  [name] Potassium] Other (See Comments)    Reaction:  Unknown   . Tramadol Other (See Comments)    Makes patient feel very drowsy/almost too strong to take    ROS As per HPI  PE: Blood pressure 129/72, pulse 67, temperature 97.7 F (36.5 C), temperature source Oral, resp. rate 16, height 5\' 7"  (1.702 m), weight 139 lb (63 kg), SpO2 97 %. Gen: Alert, tired- appearing.  Breathing is a bit fast. Patient is oriented to person, place, time, and situation. AFFECT: pleasant, lucid thought and speech.  He is anxious/worried. ZOX:WRUE: no injection, icteris, swelling, or exudate.  EOMI, PERRLA. Mouth: lips without lesion/swelling.  Oral mucosa pink and moist. Oropharynx without erythema, exudate, or swelling.  Neck - No masses or thyromegaly or limitation in range of motion CV: RRR, no m/r/g.   LUNGS: CTA bilat, nonlabored resps, good aeration in all lung fields.  RR approx 25-30. EXT: wearing compression stockings, 1+ pitting edema in  both LL's.. NEURO: no focal weakness. CN 2-12 grossly intact bilat.  No tremor.   LABS:    Chemistry      Component Value Date/Time   NA 139 03/19/2017 1254   K 3.7 03/19/2017 1254   CL 103 03/19/2017 1254   CO2 26 03/19/2017 1254   BUN 15 03/19/2017 1254   CREATININE 0.89 03/19/2017 1254   CREATININE 1.12 (H) 09/24/2015 1447      Component Value Date/Time   CALCIUM 8.7 (L) 03/19/2017 1254   ALKPHOS 84 03/19/2017 1254   AST 31 03/19/2017 1254   ALT 31 03/19/2017 1254   BILITOT 0.8 03/19/2017 1254     Lab Results  Component Value Date   TROPONINI <0.03  03/19/2017   Lab Results  Component Value Date   WBC 7.1 03/19/2017   HGB 11.9 (L) 03/19/2017   HCT 35.4 (L) 03/19/2017   MCV 92.9 03/19/2017   PLT 185 03/19/2017   UA normal 03/19/17 in ED.  02/13/17: MR cervical spine w/out contrast (after a fall): IMPRESSION: 1. Marked edema along the C7-T1 endplate on the left, within the left-sided facet joint and in the adjacent soft tissue suggesting acute on chronic subluxation without definite fracture on the CT scan. Nondisplaced fracture is considered. Significant progression on CT from the prior CT scan suggests a significant chronic component to this process. 2. Moderate central and bilateral foraminal stenosis at C7-T1. 3. No other acute osseous abnormality. 4. Multilevel spondylosis of the cervical spine as described. 5. Fusion at C4-5 is likely degenerative.  IMPRESSION AND PLAN:  1) Orthostatic dizziness--stable.  2) Panic attack/anxiety: start low dose lexapro 5mg  qd. Therapeutic expectations and side effect profile of medication discussed today.  Patient's questions answered.  3) GERD, causing nausea as well: start omeprazole 40mg  qd.  4) Malaise lately, with generalized weakness and dizziness episodes: recent ED visit reassuring. Will check CXR today given his increased RR, although this could definitely be from his increased anxiety today and of  late.  5) Chronic bilat LE venous stasis edema: very stable on lasix 1/2 of 40mg  every other day.  6) HTN: bp seems fine since son's decision to d/c hydralazine a month ago.  An After Visit Summary was printed and given to the patient.  FOLLOW UP: Return in about 2 weeks (around 04/04/2017) for 30 min f/u nausea/dizzy/BP.  Signed:  Crissie Sickles, MD           03/21/2017

## 2017-03-22 ENCOUNTER — Encounter: Payer: Self-pay | Admitting: *Deleted

## 2017-03-22 DIAGNOSIS — N183 Chronic kidney disease, stage 3 (moderate): Secondary | ICD-10-CM | POA: Diagnosis not present

## 2017-03-22 DIAGNOSIS — D631 Anemia in chronic kidney disease: Secondary | ICD-10-CM | POA: Diagnosis not present

## 2017-03-22 DIAGNOSIS — I872 Venous insufficiency (chronic) (peripheral): Secondary | ICD-10-CM | POA: Diagnosis not present

## 2017-03-22 DIAGNOSIS — I5033 Acute on chronic diastolic (congestive) heart failure: Secondary | ICD-10-CM | POA: Diagnosis not present

## 2017-03-22 DIAGNOSIS — I130 Hypertensive heart and chronic kidney disease with heart failure and stage 1 through stage 4 chronic kidney disease, or unspecified chronic kidney disease: Secondary | ICD-10-CM | POA: Diagnosis not present

## 2017-03-22 DIAGNOSIS — R296 Repeated falls: Secondary | ICD-10-CM | POA: Diagnosis not present

## 2017-03-23 ENCOUNTER — Other Ambulatory Visit: Payer: Self-pay | Admitting: Family Medicine

## 2017-03-23 DIAGNOSIS — N183 Chronic kidney disease, stage 3 (moderate): Secondary | ICD-10-CM | POA: Diagnosis not present

## 2017-03-23 DIAGNOSIS — I5033 Acute on chronic diastolic (congestive) heart failure: Secondary | ICD-10-CM | POA: Diagnosis not present

## 2017-03-23 DIAGNOSIS — R296 Repeated falls: Secondary | ICD-10-CM | POA: Diagnosis not present

## 2017-03-23 DIAGNOSIS — I130 Hypertensive heart and chronic kidney disease with heart failure and stage 1 through stage 4 chronic kidney disease, or unspecified chronic kidney disease: Secondary | ICD-10-CM | POA: Diagnosis not present

## 2017-03-23 DIAGNOSIS — D631 Anemia in chronic kidney disease: Secondary | ICD-10-CM | POA: Diagnosis not present

## 2017-03-23 DIAGNOSIS — I872 Venous insufficiency (chronic) (peripheral): Secondary | ICD-10-CM | POA: Diagnosis not present

## 2017-04-04 ENCOUNTER — Encounter (HOSPITAL_COMMUNITY): Payer: Self-pay | Admitting: Emergency Medicine

## 2017-04-04 ENCOUNTER — Observation Stay (HOSPITAL_COMMUNITY)
Admission: EM | Admit: 2017-04-04 | Discharge: 2017-04-06 | Disposition: A | Discharge status: Home / Self Care | Payer: Medicare Other | Attending: Internal Medicine | Admitting: Internal Medicine

## 2017-04-04 ENCOUNTER — Emergency Department (HOSPITAL_COMMUNITY): Payer: Medicare Other

## 2017-04-04 ENCOUNTER — Encounter: Payer: Self-pay | Admitting: Family Medicine

## 2017-04-04 ENCOUNTER — Ambulatory Visit (INDEPENDENT_AMBULATORY_CARE_PROVIDER_SITE_OTHER): Payer: Medicare Other | Admitting: Family Medicine

## 2017-04-04 VITALS — BP 108/50 | HR 62 | Temp 97.4°F | Resp 16

## 2017-04-04 DIAGNOSIS — E860 Dehydration: Secondary | ICD-10-CM

## 2017-04-04 DIAGNOSIS — R0602 Shortness of breath: Secondary | ICD-10-CM | POA: Diagnosis not present

## 2017-04-04 DIAGNOSIS — I130 Hypertensive heart and chronic kidney disease with heart failure and stage 1 through stage 4 chronic kidney disease, or unspecified chronic kidney disease: Secondary | ICD-10-CM | POA: Insufficient documentation

## 2017-04-04 DIAGNOSIS — Z85828 Personal history of other malignant neoplasm of skin: Secondary | ICD-10-CM | POA: Diagnosis not present

## 2017-04-04 DIAGNOSIS — N183 Chronic kidney disease, stage 3 (moderate): Secondary | ICD-10-CM | POA: Diagnosis not present

## 2017-04-04 DIAGNOSIS — R531 Weakness: Principal | ICD-10-CM

## 2017-04-04 DIAGNOSIS — R110 Nausea: Secondary | ICD-10-CM

## 2017-04-04 DIAGNOSIS — Z87891 Personal history of nicotine dependence: Secondary | ICD-10-CM | POA: Diagnosis not present

## 2017-04-04 DIAGNOSIS — R5382 Chronic fatigue, unspecified: Secondary | ICD-10-CM | POA: Diagnosis not present

## 2017-04-04 DIAGNOSIS — I4891 Unspecified atrial fibrillation: Secondary | ICD-10-CM | POA: Diagnosis not present

## 2017-04-04 DIAGNOSIS — R2681 Unsteadiness on feet: Secondary | ICD-10-CM | POA: Diagnosis not present

## 2017-04-04 DIAGNOSIS — I5032 Chronic diastolic (congestive) heart failure: Secondary | ICD-10-CM | POA: Insufficient documentation

## 2017-04-04 DIAGNOSIS — R630 Anorexia: Secondary | ICD-10-CM

## 2017-04-04 DIAGNOSIS — Z8673 Personal history of transient ischemic attack (TIA), and cerebral infarction without residual deficits: Secondary | ICD-10-CM | POA: Insufficient documentation

## 2017-04-04 DIAGNOSIS — R0789 Other chest pain: Secondary | ICD-10-CM

## 2017-04-04 DIAGNOSIS — R262 Difficulty in walking, not elsewhere classified: Secondary | ICD-10-CM

## 2017-04-04 DIAGNOSIS — R627 Adult failure to thrive: Secondary | ICD-10-CM

## 2017-04-04 DIAGNOSIS — R410 Disorientation, unspecified: Secondary | ICD-10-CM

## 2017-04-04 DIAGNOSIS — Z7982 Long term (current) use of aspirin: Secondary | ICD-10-CM | POA: Insufficient documentation

## 2017-04-04 DIAGNOSIS — Z79899 Other long term (current) drug therapy: Secondary | ICD-10-CM | POA: Insufficient documentation

## 2017-04-04 DIAGNOSIS — D649 Anemia, unspecified: Secondary | ICD-10-CM | POA: Diagnosis not present

## 2017-04-04 DIAGNOSIS — F0390 Unspecified dementia without behavioral disturbance: Secondary | ICD-10-CM | POA: Insufficient documentation

## 2017-04-04 DIAGNOSIS — R634 Abnormal weight loss: Secondary | ICD-10-CM | POA: Diagnosis not present

## 2017-04-04 DIAGNOSIS — I6789 Other cerebrovascular disease: Secondary | ICD-10-CM | POA: Diagnosis not present

## 2017-04-04 LAB — URINALYSIS, ROUTINE W REFLEX MICROSCOPIC
BILIRUBIN URINE: NEGATIVE
Glucose, UA: NEGATIVE mg/dL
HGB URINE DIPSTICK: NEGATIVE
KETONES UR: 5 mg/dL — AB
Leukocytes, UA: NEGATIVE
NITRITE: NEGATIVE
PROTEIN: NEGATIVE mg/dL
Specific Gravity, Urine: 1.005 (ref 1.005–1.030)
pH: 7 (ref 5.0–8.0)

## 2017-04-04 LAB — BASIC METABOLIC PANEL
ANION GAP: 11 (ref 5–15)
BUN: 16 mg/dL (ref 6–20)
CHLORIDE: 103 mmol/L (ref 101–111)
CO2: 24 mmol/L (ref 22–32)
Calcium: 8.9 mg/dL (ref 8.9–10.3)
Creatinine, Ser: 1.08 mg/dL (ref 0.61–1.24)
GFR calc Af Amer: >60 mL/min (ref >60)
GFR, EST NON AFRICAN AMERICAN: 57 mL/min — AB (ref >60)
Glucose, Bld: 107 mg/dL — ABNORMAL HIGH (ref 65–99)
POTASSIUM: 3.6 mmol/L (ref 3.5–5.1)
SODIUM: 138 mmol/L (ref 135–145)

## 2017-04-04 LAB — CBC WITH DIFFERENTIAL/PLATELET
BASOS ABS: 0 10*3/uL (ref 0.0–0.1)
BASOS PCT: 0 %
Eosinophils Absolute: 0.2 10*3/uL (ref 0.0–0.7)
Eosinophils Relative: 3 %
HEMATOCRIT: 36.4 % — AB (ref 39.0–52.0)
Hemoglobin: 12.5 g/dL — ABNORMAL LOW (ref 13.0–17.0)
Lymphocytes Relative: 34 %
Lymphs Abs: 3.1 10*3/uL (ref 0.7–4.0)
MCH: 31.2 pg (ref 26.0–34.0)
MCHC: 34.3 g/dL (ref 30.0–36.0)
MCV: 90.8 fL (ref 78.0–100.0)
MONO ABS: 0.9 10*3/uL (ref 0.1–1.0)
Monocytes Relative: 10 %
NEUTROS ABS: 4.7 10*3/uL (ref 1.7–7.7)
NEUTROS PCT: 53 %
Platelets: 206 10*3/uL (ref 150–400)
RBC: 4.01 MIL/uL — ABNORMAL LOW (ref 4.22–5.81)
RDW: 13 % (ref 11.5–15.5)
WBC: 9 10*3/uL (ref 4.0–10.5)

## 2017-04-04 LAB — HEPATIC FUNCTION PANEL
ALBUMIN: 3.5 g/dL (ref 3.5–5.0)
ALT: 29 U/L (ref 17–63)
AST: 38 U/L (ref 15–41)
Alkaline Phosphatase: 90 U/L (ref 38–126)
BILIRUBIN INDIRECT: 1.1 mg/dL — AB (ref 0.3–0.9)
Bilirubin, Direct: 0.3 mg/dL (ref 0.1–0.5)
TOTAL PROTEIN: 6.1 g/dL — AB (ref 6.5–8.1)
Total Bilirubin: 1.4 mg/dL — ABNORMAL HIGH (ref 0.3–1.2)

## 2017-04-04 LAB — TROPONIN I

## 2017-04-04 LAB — LIPASE, BLOOD: LIPASE: 20 U/L (ref 11–51)

## 2017-04-04 LAB — LACTIC ACID, PLASMA: LACTIC ACID, VENOUS: 2.3 mmol/L — AB (ref 0.5–1.9)

## 2017-04-04 MED ORDER — SODIUM CHLORIDE 0.9 % IV SOLN
INTRAVENOUS | Status: DC
  Administered 2017-04-05 – 2017-04-06 (×2): via INTRAVENOUS

## 2017-04-04 MED ORDER — SODIUM CHLORIDE 0.9 % IV BOLUS (SEPSIS)
250.0000 mL | Freq: Once | INTRAVENOUS | Status: AC
  Administered 2017-04-05: 250 mL via INTRAVENOUS

## 2017-04-04 NOTE — Progress Notes (Signed)
OFFICE VISIT  04/04/2017   CC:  Chief Complaint  Patient presents with  . Follow-up    Nausea, Dizziness and BP    HPI:    Patient is a 83 y.o. Caucasian male who presents accompanied by his son for f/u orthostatic dizziness, GAD/panic, GERD, recent malaise/fatigue. Last visit a CXR was normal.  We were unable to get any labs b/c phlebotomist unable to find good vein.  I started him on 5mg  lexapro qd. Also started him on omeprazole 40mg  qd for GERD.  He has not been doing well at all since last visit.  Very fatigued.  Light headed.  No difficulty urinating.  Question of some increase in his baseline urinary frequency but no dysuria or hematuria.  No foul odor to urine.  Sometimes urine clear and sometimes dark yellow. Sleeping a lot during day, intermittently confused more lately per son's report, not eating much at all except for supper and drinks 2 ensure per day. Complains of upset stomach/nausea frequently.  Son says he is worse since I started him on the lexapro. No diarrhea.  NO abd pain. Has intermittent chest pressure and SOB.  No CP. Rarely uses his walker anymore at home --has to have WC. Has really declined in the last 2 days. Only occ cough. No fever.   Past Medical History:  Diagnosis Date  . Basal cell carcinoma 2011; 2016   Face  . BPH with obstruction/lower urinary tract symptoms   . Cervical spondylosis    with miltilevel changes leading to narrowing or compression of spinal nerve foramina.  +spinal stenosis.  Mild spondylolisthesis L7-T1.  Marland Kitchen Chronic back pain   . Chronic diastolic heart failure ([place])   . Chronic left shoulder pain 09/14/2013  . Chronic renal insufficiency, stage III (moderate) (HCC) 2013   CrCl in the 50s-60s  . Chronic venous insufficiency    Compression hose  . 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  No significant residual deficit except short term memory impairment  . Dementia 2016   Dr. Delice Lesch 08/2014--started aricept but  pt did not tolerate.  Exelon trial started by Dr. Delice Lesch 05/2016.  Marland Kitchen Depression   . Frequent headaches   . Glaucoma   . Hypertension   . Normocytic anemia 2015   Secondary to CRI (iron studies ok 12/2013)  . NSTEMI (non-ST elevated myocardial infarction) ([place]) 06/2011   [place]: Troponin mildly elevated, normal EKG, normal ECHO, pt declined cardiology consult--per old PCP records.  Marland Kitchen OAB (overactive bladder)   . Osteoarthritis   . Peripheral neuropathy 2013   Dx'd by neuro in [place] at the time the patient was hospitalized briefly for pontine CVA.  Marland Kitchen Urine incontinence   . Vertigo     Past Surgical History:  Procedure Laterality Date  . CHOLECYSTECTOMY    . NM VQ LUNG SCAN ([place] [facility])  09/2014   Low risk  . TONSILECTOMY, ADENOIDECTOMY, BILATERAL MYRINGOTOMY AND TUBES    . TRANSTHORACIC ECHOCARDIOGRAM  12/18/13   Grade 1 diast dysf, o/w normal    Outpatient Medications Prior to Visit  Medication Sig Dispense Refill  . aspirin 81 MG chewable tablet Chew 81 mg by mouth daily.    . finasteride (PROSCAR) 5 MG tablet TAKE ONE TABLET BY MOUTH EVERY DAY 30 tablet 5  . levobunolol (BETAGAN) 0.5 % ophthalmic solution Place 1 drop into both eyes 2 (two) times daily. 5 mL 6  . Multiple Vitamins-Minerals (ICAPS AREDS 2 PO) Take 1 capsule by mouth 2 (two)  times daily.    Marland Kitchen omeprazole (PRILOSEC) 40 MG capsule Take 1 capsule (40 mg total) by mouth daily. 30 capsule 3  . polyethylene glycol powder (GLYCOLAX/MIRALAX) powder Take 17 g by mouth 2 (two) times daily as needed. 3350 g 6  . tamsulosin (FLOMAX) 0.4 MG CAPS capsule TAKE 2 CAPSULES BY MOUTH AT BEDTIME 30 capsule 5  . atorvastatin (LIPITOR) 20 MG tablet Take 1 tablet (20 mg total) by mouth at bedtime. 30 tablet 5  . escitalopram (LEXAPRO) 5 MG tablet Take 1 tablet (5 mg total) by mouth daily. 30 tablet 0  . furosemide (LASIX) 40 MG tablet 1/2 tab po qod 30 tablet 3  . rivastigmine (EXELON) 1.5 MG capsule TAKE 1 CAPSULE BY MOUTH TWICE DAILY 180  capsule 0  . tamsulosin (FLOMAX) 0.4 MG CAPS capsule TAKE 2 CAPSULES BY MOUTH AT BEDTIME (Patient not taking: Reported on 04/04/2017) 60 capsule 6   No facility-administered medications prior to visit.     Allergies  Allergen Reactions  . Altace [Ramipril] Other (See Comments)    Reaction:  Unknown   . Amoxicillin Other (See Comments)    Reaction:  Unknown  Has patient had a PCN reaction causing immediate rash, facial/tongue/throat swelling, SOB or lightheadedness with hypotension: Unsure Has patient had a PCN reaction causing severe rash involving mucus membranes or skin necrosis: Unsure Has patient had a PCN reaction that required hospitalization Unsure Has patient had a PCN reaction occurring within the last 10 years: Unsure If all of the above answers are "NO", then may proceed with Cephalosporin use.  [name] Potassium] Other (See Comments)    Reaction:  Unknown   . Tramadol Other (See Comments)    Makes patient feel very drowsy/almost too strong to take    ROS As per HPI  PE: Blood pressure (!) 108/50, pulse 62, temperature (!) 97.4 F (36.3 C), temperature source Oral, resp. rate 16, SpO2 99 %.  Pt too weak to get out of WC to weigh today.  Gen: Alert, chronically ill-appearing.  Patient is oriented to person, place, time, and situation. Speaks in short sentences but no resp distress. QPR:FFMB: no injection, icteris, swelling, or exudate.  EOMI, PERRLA. Mouth: lips without lesion/swelling.  Oral mucosa pink and moist. Oropharynx without erythema, exudate, or swelling.  CV: RRR, no m/r/g.  Rare ectopic beat. LUNGS: CTA bilat, nonlabored resps, good aeration in all lung fields. ABD: soft, NT/ND EXT: no clubbing, cyanosis, or edema.  Skin: no pallor or jaundice   LABS:   12 lead EKG today: a-fib, rate 56, left axis, anterior fascicular block, no ST elevation or depression. Compared to EKG 03/19/17 a-fib is new, anterior fascicular block is new.  Lab Results   Component Value Date   TSH 3.390 02/18/2014   Lab Results  Component Value Date   WBC 7.1 03/19/2017   HGB 11.9 (L) 03/19/2017   HCT 35.4 (L) 03/19/2017   MCV 92.9 03/19/2017   PLT 185 03/19/2017   Lab Results  Component Value Date   IRON 45 02/12/2017   TIBC 183 (L) 02/12/2017   FERRITIN 149 02/12/2017   Lab Results  Component Value Date   VITAMINB12 331 02/12/2017   Lab Results  Component Value Date   CREATININE 0.89 03/19/2017   BUN 15 03/19/2017   NA 139 03/19/2017   K 3.7 03/19/2017   CL 103 03/19/2017   CO2 26 03/19/2017   Lab Results  Component Value Date   ALT 31 03/19/2017  AST 31 03/19/2017   ALKPHOS 84 03/19/2017   BILITOT 0.8 03/19/2017   Lab Results  Component Value Date   CHOL 141 10/24/2013   Lab Results  Component Value Date   HDL 60.60 10/24/2013   Lab Results  Component Value Date   LDLCALC 70 10/24/2013   Lab Results  Component Value Date   TRIG 51.0 10/24/2013   Lab Results  Component Value Date   CHOLHDL 2 10/24/2013   Lab Results  Component Value Date   HGBA1C 5.9 03/19/2014   Lab Results  Component Value Date   TROPONINI <0.03 03/19/2017    IMPRESSION AND PLAN:  Progressive fatigue, anorexia, chronic nausea, now with dehydration and new dx a-fib with anterior fascicular block. On auscultation he does not have an irregular rhythm.  Suspect PAF. Given his inability to adequately rehydrate orally, he needs to go to the ED for IV rehydration. They will then likely do any further eval as needed regarding his a-fib.  They may be able to get blood sample for the labs I was going to get on him here today: CBC, CMET, TSH, Lipase, and UA. Even if he has PAF, I am not convinced it is the cause of all his problems of late.  Additionally, he does not require rate control at this time, nor is he a good candidate for anticoagulation given his mild dementia and high fall risk. Regarding his chronic nausea, I feel like he has dyspepsia  with signif GERD.  Omeprazole of unclear benefit. He does not have any signs worrisome for serious intra-abdominal process. PLAN: Go to ED.  Stop lexapro. Hold lasix. Stop atorvastatin. Stop exelon.  Spent 45 min with pt today, with >50% of this time spent in counseling and care coordination regarding the above problems.  FOLLOW UP:  To be determined based on ED/hosp eval.  Signed:  Crissie Sickles, MD           04/04/2017

## 2017-04-04 NOTE — ED Notes (Signed)
Tried to ambulate pt, but was unable. Stood up but could not take steps, pt stated I cant walk I just cant. RN notified

## 2017-04-04 NOTE — ED Provider Notes (Signed)
[facility] EMERGENCY DEPARTMENT Provider Note   CSN: [id number] Arrival date & time: 04/04/17  1755     History   Chief Complaint Chief Complaint  Patient presents with  . Shortness of Breath    HPI Lance Kemp is a 83 y.o. male.  The history is provided by the patient. The history is limited by the condition of the patient (Hx dementia).  Shortness of Breath   Pt was seen at 2110. Per pt: States he was home walking when he felt "weak" and "SOB." Pt states he fell getting to the door after calling his son who lives on the same street (pt states he lives alone). Per previous medical records: Pt was evaluated in his PMD office today, c/o generalized fatigue/malaise, decreased PO intake, nausea, increased confusion over the past 2 days. No reported vomiting/diarrhea, no fevers, no CP/SOB, no abd pain.   Past Medical History:  Diagnosis Date  . Basal cell carcinoma 2011; 2016   Face  . BPH with obstruction/lower urinary tract symptoms   . Cervical spondylosis    with miltilevel changes leading to narrowing or compression of spinal nerve foramina.  +spinal stenosis.  Mild spondylolisthesis L7-T1.  Marland Kitchen Chronic back pain   . Chronic diastolic heart failure ([place])   . Chronic left shoulder pain 09/14/2013  . Chronic renal insufficiency, stage III (moderate) (HCC) 2013   CrCl in the 50s-60s  . Chronic venous insufficiency    Compression hose  . 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  No significant residual deficit except short term memory impairment  . Dementia 2016   Dr. Delice Lesch 08/2014--started aricept but pt did not tolerate.  Exelon trial started by Dr. Delice Lesch 05/2016.  Marland Kitchen Depression   . Frequent headaches   . Glaucoma   . Hypertension   . Normocytic anemia 2015   Secondary to CRI (iron studies ok 12/2013)  . NSTEMI (non-ST elevated myocardial infarction) ([place]) 06/2011   [place]: Troponin mildly elevated, normal EKG, normal ECHO, pt declined cardiology consult--per  old PCP records.  Marland Kitchen OAB (overactive bladder)   . Osteoarthritis   . Peripheral neuropathy 2013   Dx'd by neuro in [place] at the time the patient was hospitalized briefly for pontine CVA.  Marland Kitchen Urine incontinence   . Vertigo     Patient Active Problem List   Diagnosis Date Noted  . Vertigo 02/04/2017  . Cellulitis of left lower extremity 04/25/2016  . Cellulitis 04/25/2016  . Basal cell carcinoma, face 04/07/2015  . Mild dementia 11/24/2014  . OAB (overactive bladder) 10/17/2014  . Essential hypertension 08/25/2014  . Hyperlipidemia 08/25/2014  . Peripheral edema 04/16/2014  . Chronic diastolic heart failure ([place]) 03/04/2014  . Chronic renal insufficiency, stage III (moderate) (HCC)   . Chronic shoulder pain 02/19/2014  . SOB (shortness of breath) 02/18/2014  . Acute on chronic diastolic heart failure ([place]) 12/24/2013  . Lumbago 12/18/2013  . History of CVA (cerebrovascular accident) 11/21/2013  . BPH associated with nocturia 11/21/2013  . Short-term memory loss 10/24/2013  . Chronic left shoulder pain 09/14/2013  . Venous insufficiency of both lower extremities 09/14/2013  . HTN (hypertension), benign 09/14/2013  . Normocytic anemia 06/19/2013    Past Surgical History:  Procedure Laterality Date  . CHOLECYSTECTOMY    . NM VQ LUNG SCAN ([place] [facility])  09/2014   Low risk  . TONSILECTOMY, ADENOIDECTOMY, BILATERAL MYRINGOTOMY AND TUBES    . TRANSTHORACIC ECHOCARDIOGRAM  12/18/13   Grade 1 diast dysf, o/w normal  Home Medications    Prior to Admission medications   Medication Sig Start Date End Date Taking? Authorizing Provider  aspirin 81 MG chewable tablet Chew 81 mg by mouth daily.   Yes [provider]  azelastine (OPTIVAR) 0.05 % ophthalmic solution Place 2 drops into both eyes every evening.   Yes [provider]  finasteride (PROSCAR) 5 MG tablet TAKE ONE TABLET BY MOUTH EVERY DAY 01/25/17  Yes McGowen, [name], MD  levobunolol (BETAGAN) 0.5 %  ophthalmic solution Place 1 drop into both eyes 2 (two) times daily. Patient taking differently: Place 2 drops into both eyes daily.  06/21/16  Yes McGowen, [name], MD  Multiple Vitamins-Minerals (ICAPS AREDS 2 PO) Take 1 capsule by mouth 2 (two) times daily.   Yes [provider]  omeprazole (PRILOSEC) 40 MG capsule Take 1 capsule (40 mg total) by mouth daily. 03/21/17  Yes McGowen, [name], MD  polyethylene glycol powder (GLYCOLAX/MIRALAX) powder Take 17 g by mouth 2 (two) times daily as needed. 06/21/16  Yes McGowen, [name], MD  tamsulosin (FLOMAX) 0.4 MG CAPS capsule TAKE 2 CAPSULES BY MOUTH AT BEDTIME 03/23/17  Yes McGowen, [name], MD    Family History Family History  Problem Relation Age of Onset  . Cancer Mother   . Heart disease Mother     Social History Social History  Substance Use Topics  . Smoking status: Former Smoker    Types: Pipe  . Smokeless tobacco: Never Used  . Alcohol use No     Allergies   Altace [ramipril]; Amoxicillin; Cozaar [losartan potassium]; and Tramadol   Review of Systems Review of Systems  Unable to perform ROS: Dementia  Respiratory: Positive for shortness of breath.      Physical Exam Updated Vital Signs BP (!) 146/63   Pulse 65   Temp 97.9 F (36.6 C) (Oral)   Resp 20   Ht 5\' 7"  (1.702 m)   Wt 63 kg (139 lb)   SpO2 98%   BMI 21.77 kg/m   21:54 Orthostatic Vital Signs JF  Orthostatic Lying   BP- Lying: 153/74  Pulse- Lying: 60      Orthostatic Sitting  BP- Sitting: 139/79  Pulse- Sitting: 76      Orthostatic Standing at 0 minutes  BP- Standing at 0 minutes: 126/63  Pulse- Standing at 0 minutes: 88     Physical Exam 2115: Physical examination:  Nursing notes reviewed; Vital signs and O2 SAT reviewed;  Constitutional: Well developed, Well nourished, In no acute distress; Head:  Normocephalic, atraumatic; Eyes: EOMI, PERRL, No scleral icterus; ENMT: Mouth and pharynx normal, Mucous membranes dry; Neck:  Supple, Full range of motion, No lymphadenopathy; Cardiovascular: Regular rate and rhythm, No gallop; Respiratory: Breath sounds clear & equal bilaterally, No wheezes.  Speaking full sentences with ease, Normal respiratory effort/excursion; Chest: Nontender, Movement normal; Abdomen: Soft, Nontender, Nondistended, Normal bowel sounds; Genitourinary: No CVA tenderness; Extremities: Pulses normal, No tenderness, No edema, No calf edema or asymmetry.; Neuro: Awake, alert, confused re: time, place, events. No facial droop, major CN grossly intact.  Speech clear. Grips equal. Strength 4/5 equal bilat UE's and LE's. Moves all extremities spontaneously and to command.; Skin: Color normal, Warm, Dry.   ED Treatments / Results  Labs (all labs ordered are listed, but only abnormal results are displayed)   EKG  EKG Interpretation  Date/Time:  Wednesday April 04 2017 18:09:14 EDT Ventricular Rate:  54 PR Interval:  136 QRS Duration: 96 QT  Interval:  458 QTC Calculation: 434 R Axis:   -23 Text Interpretation:  Sinus bradycardia Left axis deviation Left ventricular hypertrophy with repolarization abnormality When compared with ECG of 03/19/2017 QT has shortened and rate slower,  Otherwise no significant change Confirmed by Francine Graven [phone]) on 04/04/2017 10:38:37 PM       Radiology   Procedures Procedures (including critical care time)  Medications Ordered in ED Medications  sodium chloride 0.9 % bolus 250 mL (not administered)  0.9 %  sodium chloride infusion (not administered)     Initial Impression / Assessment and Plan / ED Course  I have reviewed the triage vital signs and the nursing notes.  Pertinent labs & imaging results that were available during my care of the patient were reviewed by me and considered in my medical decision making (see chart for details).  MDM Reviewed: previous chart, nursing note and vitals Reviewed previous: labs and ECG Interpretation: labs,  ECG, x-ray and CT scan    Results for orders placed or performed during the hospital encounter of 04/04/17  CBC with Differential  Result Value Ref Range   WBC 9.0 4.0 - 10.5 K/uL   RBC 4.01 (L) 4.22 - 5.81 MIL/uL   Hemoglobin 12.5 (L) 13.0 - 17.0 g/dL   HCT 36.4 (L) 39.0 - 52.0 %   MCV 90.8 78.0 - 100.0 fL   MCH 31.2 26.0 - 34.0 pg   MCHC 34.3 30.0 - 36.0 g/dL   RDW 13.0 11.5 - 15.5 %   Platelets 206 150 - 400 K/uL   Neutrophils Relative % 53 %   Neutro Abs 4.7 1.7 - 7.7 K/uL   Lymphocytes Relative 34 %   Lymphs Abs 3.1 0.7 - 4.0 K/uL   Monocytes Relative 10 %   Monocytes Absolute 0.9 0.1 - 1.0 K/uL   Eosinophils Relative 3 %   Eosinophils Absolute 0.2 0.0 - 0.7 K/uL   Basophils Relative 0 %   Basophils Absolute 0.0 0.0 - 0.1 K/uL  Basic metabolic panel  Result Value Ref Range   Sodium 138 135 - 145 mmol/L   Potassium 3.6 3.5 - 5.1 mmol/L   Chloride 103 101 - 111 mmol/L   CO2 24 22 - 32 mmol/L   Glucose, Bld 107 (H) 65 - 99 mg/dL   BUN 16 6 - 20 mg/dL   Creatinine, Ser 1.08 0.61 - 1.24 mg/dL   Calcium 8.9 8.9 - 10.3 mg/dL   GFR calc non Af Amer 57 (L) >60 mL/min   GFR calc Af Amer >60 >60 mL/min   Anion gap 11 5 - 15  Troponin I  Result Value Ref Range   Troponin I <0.03 <0.03 ng/mL  Lipase, blood  Result Value Ref Range   Lipase 20 11 - 51 U/L  Lactic acid, plasma  Result Value Ref Range   Lactic Acid, Venous 2.3 (HH) 0.5 - 1.9 mmol/L  Urinalysis, Routine w reflex microscopic  Result Value Ref Range   Color, Urine STRAW (A) YELLOW   APPearance CLEAR CLEAR   Specific Gravity, Urine 1.005 1.005 - 1.030   pH 7.0 5.0 - 8.0   Glucose, UA NEGATIVE NEGATIVE mg/dL   Hgb urine dipstick NEGATIVE NEGATIVE   Bilirubin Urine NEGATIVE NEGATIVE   Ketones, ur 5 (A) NEGATIVE mg/dL   Protein, ur NEGATIVE NEGATIVE mg/dL   Nitrite NEGATIVE NEGATIVE   Leukocytes, UA NEGATIVE NEGATIVE  Hepatic function panel  Result Value Ref Range   Total Protein 6.1 (L) 6.5 -  8.1 g/dL    Albumin 3.5 3.5 - 5.0 g/dL   AST 38 15 - 41 U/L   ALT 29 17 - 63 U/L   Alkaline Phosphatase 90 38 - 126 U/L   Total Bilirubin 1.4 (H) 0.3 - 1.2 mg/dL   Bilirubin, Direct 0.3 0.1 - 0.5 mg/dL   Indirect Bilirubin 1.1 (H) 0.3 - 0.9 mg/dL   Dg Chest 2 View Result Date: 04/04/2017 CLINICAL DATA:  Shortness of Breath EXAM: CHEST  2 VIEW COMPARISON:  03/21/2017 FINDINGS: Cardiac shadow is stable. Aortic calcifications are again seen. The lungs are well aerated bilaterally without focal infiltrate or sizable effusion. No acute bony abnormality is seen. IMPRESSION: No active cardiopulmonary disease. Electronically Signed   By: Inez Catalina M.D.   On: 04/04/2017 18:51    Ct Head Wo Contrast Result Date: 04/04/2017 CLINICAL DATA:  Generalized weakness. EXAM: CT HEAD WITHOUT CONTRAST TECHNIQUE: Contiguous axial images were obtained from the base of the skull through the vertex without intravenous contrast. COMPARISON:  Head CT 02/11/2017 and brain MRI 07/27/2015 FINDINGS: Brain: No mass lesion, intraparenchymal hemorrhage or extra-axial collection. No evidence of acute cortical infarct. There is advanced global atrophy without specific lobar predilection. Extensive periventricular hypoattenuation consistent with chronic ischemic microangiopathy. Vascular: Atherosclerotic calcification of the vertebral and internal carotid arteries at the skull base. Skull: Normal visualized skull base, calvarium and extracranial soft tissues. Sinuses/Orbits: No sinus fluid levels or advanced mucosal thickening. No mastoid effusion. Normal orbits. IMPRESSION: Advanced atrophy and sequelae of chronic ischemic microangiopathy without acute abnormality. Electronically Signed   By: Ulyses Jarred M.D.   On: 04/04/2017 21:54     2310:  Pt was able to stand with assist for orthostatic VS, but unable to take any steps to walk. Pt lives home alone; concerned for safety.  T/C to [facility] Dr. Darrick Meigs, case discussed, including:  HPI, pertinent  PM/SHx, VS/PE, dx testing, ED course and treatment:  Agreeable to admit.   Final Clinical Impressions(s) / ED Diagnoses   Final diagnoses:  None    New Prescriptions New Prescriptions   No medications on file      Francine Graven, DO 04/07/17 1553

## 2017-04-04 NOTE — ED Notes (Signed)
Pt states he fell at home 1-2 months ago and had difficulty getting back to his feet. Pt uses a walker assistive device to ambulate.

## 2017-04-04 NOTE — ED Notes (Signed)
Received report on pt, pt lying semi fowlers in bed, pt confused, unsure of what hospital he is at, still continues to state that his son is coming to pick him up, updated given, comfort measures provided,

## 2017-04-04 NOTE — ED Triage Notes (Signed)
Pt went to see pcp due to generalized weakness follow up. Pt reports increasing weakness and SOB. nad noted.

## 2017-04-04 NOTE — ED Notes (Signed)
Lactic acid 2.3, reported to Dr Thurnell Garbe

## 2017-04-05 ENCOUNTER — Observation Stay (HOSPITAL_COMMUNITY): Payer: Medicare Other

## 2017-04-05 DIAGNOSIS — R531 Weakness: Secondary | ICD-10-CM | POA: Diagnosis not present

## 2017-04-05 DIAGNOSIS — R110 Nausea: Secondary | ICD-10-CM | POA: Diagnosis not present

## 2017-04-05 DIAGNOSIS — R410 Disorientation, unspecified: Secondary | ICD-10-CM

## 2017-04-05 DIAGNOSIS — R627 Adult failure to thrive: Secondary | ICD-10-CM | POA: Diagnosis not present

## 2017-04-05 DIAGNOSIS — R262 Difficulty in walking, not elsewhere classified: Secondary | ICD-10-CM | POA: Diagnosis not present

## 2017-04-05 DIAGNOSIS — R630 Anorexia: Secondary | ICD-10-CM

## 2017-04-05 DIAGNOSIS — R2681 Unsteadiness on feet: Secondary | ICD-10-CM

## 2017-04-05 LAB — COMPREHENSIVE METABOLIC PANEL
ALT: 27 U/L (ref 17–63)
AST: 34 U/L (ref 15–41)
Albumin: 3.2 g/dL — ABNORMAL LOW (ref 3.5–5.0)
Alkaline Phosphatase: 85 U/L (ref 38–126)
Anion gap: 9 (ref 5–15)
BUN: 15 mg/dL (ref 6–20)
CHLORIDE: 105 mmol/L (ref 101–111)
CO2: 25 mmol/L (ref 22–32)
CREATININE: 1.01 mg/dL (ref 0.61–1.24)
Calcium: 8.6 mg/dL — ABNORMAL LOW (ref 8.9–10.3)
GFR calc non Af Amer: >60 mL/min (ref >60)
Glucose, Bld: 98 mg/dL (ref 65–99)
POTASSIUM: 3.9 mmol/L (ref 3.5–5.1)
SODIUM: 139 mmol/L (ref 135–145)
Total Bilirubin: 1.8 mg/dL — ABNORMAL HIGH (ref 0.3–1.2)
Total Protein: 6 g/dL — ABNORMAL LOW (ref 6.5–8.1)

## 2017-04-05 LAB — CBC
HCT: 35.4 % — ABNORMAL LOW (ref 39.0–52.0)
Hemoglobin: 12.3 g/dL — ABNORMAL LOW (ref 13.0–17.0)
MCH: 31.3 pg (ref 26.0–34.0)
MCHC: 34.7 g/dL (ref 30.0–36.0)
MCV: 90.1 fL (ref 78.0–100.0)
PLATELETS: 183 10*3/uL (ref 150–400)
RBC: 3.93 MIL/uL — AB (ref 4.22–5.81)
RDW: 12.8 % (ref 11.5–15.5)
WBC: 7.9 10*3/uL (ref 4.0–10.5)

## 2017-04-05 LAB — TSH: TSH: 3.402 u[IU]/mL (ref 0.350–4.500)

## 2017-04-05 LAB — MRSA PCR SCREENING: MRSA by PCR: NEGATIVE

## 2017-04-05 LAB — LACTIC ACID, PLASMA: LACTIC ACID, VENOUS: 2.5 mmol/L — AB (ref 0.5–1.9)

## 2017-04-05 MED ORDER — FINASTERIDE 5 MG PO TABS
5.0000 mg | ORAL_TABLET | Freq: Every day | ORAL | Status: DC
  Administered 2017-04-05 – 2017-04-06 (×2): 5 mg via ORAL
  Filled 2017-04-05 (×5): qty 1

## 2017-04-05 MED ORDER — ENSURE ENLIVE PO LIQD
237.0000 mL | Freq: Two times a day (BID) | ORAL | Status: DC
  Administered 2017-04-06 (×2): 237 mL via ORAL

## 2017-04-05 MED ORDER — PANTOPRAZOLE SODIUM 40 MG PO TBEC
40.0000 mg | DELAYED_RELEASE_TABLET | Freq: Every day | ORAL | Status: DC
  Administered 2017-04-05 – 2017-04-06 (×2): 40 mg via ORAL
  Filled 2017-04-05 (×2): qty 1

## 2017-04-05 MED ORDER — ENOXAPARIN SODIUM 40 MG/0.4ML ~~LOC~~ SOLN
40.0000 mg | SUBCUTANEOUS | Status: DC
  Administered 2017-04-05 – 2017-04-06 (×2): 40 mg via SUBCUTANEOUS
  Filled 2017-04-05 (×2): qty 0.4

## 2017-04-05 MED ORDER — ACETAMINOPHEN 325 MG PO TABS: 650.0000 mg | ORAL_TABLET | Freq: Four times a day (QID) | ORAL | Status: DC | PRN

## 2017-04-05 MED ORDER — LEVOBUNOLOL HCL 0.5 % OP SOLN
2.0000 [drp] | Freq: Every day | OPHTHALMIC | Status: DC
  Administered 2017-04-05 – 2017-04-06 (×2): 2 [drp] via OPHTHALMIC
  Filled 2017-04-05: qty 5

## 2017-04-05 MED ORDER — ACETAMINOPHEN 650 MG RE SUPP: 650.0000 mg | Freq: Four times a day (QID) | RECTAL | Status: DC | PRN

## 2017-04-05 MED ORDER — TAMSULOSIN HCL 0.4 MG PO CAPS: 0.8000 mg | ORAL_CAPSULE | Freq: Every day | ORAL | Status: DC

## 2017-04-05 MED ORDER — ASPIRIN 81 MG PO CHEW
81.0000 mg | CHEWABLE_TABLET | Freq: Every day | ORAL | Status: DC
  Administered 2017-04-05 – 2017-04-06 (×2): 81 mg via ORAL
  Filled 2017-04-05 (×2): qty 1

## 2017-04-05 MED ORDER — ONDANSETRON HCL 4 MG PO TABS
4.0000 mg | ORAL_TABLET | Freq: Three times a day (TID) | ORAL | Status: DC
  Administered 2017-04-05 – 2017-04-06 (×3): 4 mg via ORAL
  Filled 2017-04-05 (×3): qty 1

## 2017-04-05 NOTE — Care Management Note (Addendum)
Case Management Note  Patient Details  Name: Lance Kemp MRN: [id number] Date of Birth: 1922/08/12  Subjective/Objective:  Adm with generalized weakness. Discussed with son via phone. Sent by PCP for low BP and concern for new afib. Patient is from home alone, walks with a RW. Son lives across the street. Patient has recently finished services with [facility]. He does pay OOP for aide services on Monday for 3 hours. He has a PCP, son drives him to appointments. He has Medicare and reports no issues affording medications. PT eval also ordered. Son reports seeing a decline in patient. Son aware patient may not qualify for SNF if recommended. Discussed Medicaid, son reports that patient may be ineligible.                   Action/Plan: Anticipate DC home with home health . Following for needs.   ADDENDUM: 1130: Discussed SNF recommendation with son. Patient does not qualify for SNF placement at this time. Son is fine with patient receiving HH, would like [facility]. Discussed having someone with patient at all times, son reports he lives right across the street and is only one minute away. He is upset that people keeping saying that "Pt is alone", He reports that is why they bought the house next door, to be close to patient. We discuss Medicaid again for potential long term placement.    Expected Discharge Date:      04/06/2017            Expected Discharge Plan:     In-House Referral:     Discharge planning Services  CM Consult  Post Acute Care Choice:    Choice offered to:     DME Arranged:    DME Agency:     HH Arranged:    HH Agency:     Status of Service:  In process, will continue to follow  If discussed at Long Length of Stay Meetings, dates discussed:    Additional Comments:  Amiee Wiley, Chauncey Reading, RN 04/05/2017, 3:52 PM

## 2017-04-05 NOTE — ED Notes (Signed)
Lab advised that pt's lactic acid was running,

## 2017-04-05 NOTE — Care Management Obs Status (Signed)
[facility] NOTIFICATION   Patient Details  Name: Lance Kemp MRN: [id number] Date of Birth: [birth year]   Medicare Observation Status Notification Given:  Yes    Alfred Harrel, Chauncey Reading, RN 04/05/2017, 3:36 PM

## 2017-04-05 NOTE — Progress Notes (Signed)
Pt transferred to 300 via wheel chair.

## 2017-04-05 NOTE — ED Notes (Signed)
CRITICAL VALUE ALERT  Critical Value:  Lactic acid 2.5  Date & Time Notied:  04/05/2017 @02 :53  Provider Notified: Dr Darrick Meigs  Orders Received/Actions taken: no additional orders

## 2017-04-05 NOTE — Progress Notes (Signed)
PROGRESS NOTE  Lance Kemp  SWN:[id number] DOB: [birth year] DOA: 04/04/2017 PCP: [name], MD  Brief Narrative:   Lance Kemp  is a 83 y.o. male, with history of chronic diastolic CHF, basal cell carcinoma, CKD stage III, chronic nausea who presents with acutely worsened anorexia, nausea, and generalized weakness.  Patient was seen by his PCP who was unable to obtain bloodwork in the office, so that patient was referred to the ER for blood work and IVF until patient able to tolerate adequate PO.  Orthostatics were positive in the ER.  Minimal PO intake so far today and ongoing nausea.    Assessment & Plan:   Active Problems:   Generalized weakness  Anorexia with nausea and 13-lbs weight loss over the last two months.  May have underlying malignancy.  Has known basal cell carcinoma on his face.   -  Continue PPI -  Add zofran prior to meals -  KUB to eval for retained stool -  Regular diet -  Add supplements -  Nutrition consultation  Dehydration/positive orthostatics.   -  Continue IVF -  Repeat orthostatic vital signs  Generalized weakness, may be due to malnutrition/weight loss, underlying malignancy, worsening dementia and more acutely due to dehydration -  PT evaluation -  D/c flomax, continue finasteride -  TED hose  -  Place on telemetry to monitor for heart rhythm problems -  Troponin was negative -  No evidence of heart failure on exam  Dementia, oriented only to person, picking at clothes and unable to put his arm in his gown sleeve without assistance due to confusion.   -  Likely contributing to aforementioned problems  Anemia of chronic disease, iron, vitamin b12, folate just completed less than two months ago -  Hemoglobin at baseline of 10-12g/dl  Hx of CVA, continue aspirin  Chronic renal insufficiency stage III, creatinine at baseline  DVT prophylaxis:  lovenox Code Status:  DNR Family Communication:  Patient alone Disposition Plan:   Awaiting PT evaluation    Consultants:   none  Procedures:  none  Antimicrobials:  Anti-infectives    None       Subjective: Ongoing nausea and was only able to eat a few bites of breakfast.  Feels like he needs to have a BM.    Objective: Vitals:   04/05/17 0426 04/05/17 0532 04/05/17 0735 04/05/17 1102  BP: (!) 173/51   (!) 103/91  Pulse: 64   73  Resp: (!) 23   18  Temp:  97.6 F (36.4 C) (!) 97.4 F (36.3 C) (!) 97.5 F (36.4 C)  TempSrc:  Oral Oral Oral  SpO2: 100%   95%  Weight:  59.4 kg (130 lb 15.3 oz)    Height:  5\' 7"  (1.702 m)      Intake/Output Summary (Last 24 hours) at 04/05/17 1407 Last data filed at 04/05/17 1102  Gross per 24 hour  Intake             1140 ml  Output              400 ml  Net              740 ml   Filed Weights   04/04/17 1802 04/05/17 0532  Weight: 63 kg (139 lb) 59.4 kg (130 lb 15.3 oz)    Examination:  General exam:  Adult male, thin, temporal wasting, picking at gown and confused.  No acute distress.  HEENT:  NCAT,  MMM Respiratory system: Clear to auscultation bilaterally Cardiovascular system: Regular rate and rhythm, normal S1/S2. No murmurs, rubs, gallops or clicks.  Warm extremities Gastrointestinal system: Normal active bowel sounds, soft, nondistended, nontender. MSK:  Normal tone and bulk, no lower extremity edema Neuro:  Grossly moves all extremities    Data Reviewed: I have personally reviewed following labs and imaging studies  CBC:  Recent Labs Lab 04/04/17 2049 04/05/17 0611  WBC 9.0 7.9  NEUTROABS 4.7  --   HGB 12.5* 12.3*  HCT 36.4* 35.4*  MCV 90.8 90.1  PLT 206 390   Basic Metabolic Panel:  Recent Labs Lab 04/04/17 2049 04/05/17 0611  NA 138 139  K 3.6 3.9  CL 103 105  CO2 24 25  GLUCOSE 107* 98  BUN 16 15  CREATININE 1.08 1.01  CALCIUM 8.9 8.6*   GFR: Estimated Creatinine Clearance: 39.2 mL/min (by C-G formula based on SCr of 1.01 mg/dL). Liver Function Tests:  Recent  Labs Lab 04/04/17 2049 04/05/17 0611  AST 38 34  ALT 29 27  ALKPHOS 90 85  BILITOT 1.4* 1.8*  PROT 6.1* 6.0*  ALBUMIN 3.5 3.2*    Recent Labs Lab 04/04/17 2049  LIPASE 20   No results for input(s): AMMONIA in the last 168 hours. Coagulation Profile: No results for input(s): INR, PROTIME in the last 168 hours. Cardiac Enzymes:  Recent Labs Lab 04/04/17 2049  TROPONINI <0.03   BNP (last 3 results) No results for input(s): PROBNP in the last 8760 hours. HbA1C: No results for input(s): HGBA1C in the last 72 hours. CBG: No results for input(s): GLUCAP in the last 168 hours. Lipid Profile: No results for input(s): CHOL, HDL, LDLCALC, TRIG, CHOLHDL, LDLDIRECT in the last 72 hours. Thyroid Function Tests:  Recent Labs  04/05/17 1122  TSH 3.402   Anemia Panel: No results for input(s): VITAMINB12, FOLATE, FERRITIN, TIBC, IRON, RETICCTPCT in the last 72 hours. Urine analysis:    Component Value Date/Time   COLORURINE STRAW (A) 04/04/2017 2114   APPEARANCEUR CLEAR 04/04/2017 2114   LABSPEC 1.005 04/04/2017 2114   PHURINE 7.0 04/04/2017 2114   GLUCOSEU NEGATIVE 04/04/2017 2114   HGBUR NEGATIVE 04/04/2017 2114   BILIRUBINUR NEGATIVE 04/04/2017 2114   BILIRUBINUR neg 09/24/2015 1504   KETONESUR 5 (A) 04/04/2017 2114   PROTEINUR NEGATIVE 04/04/2017 2114   UROBILINOGEN 0.2 09/24/2015 1504   UROBILINOGEN 0.2 09/10/2014 1052   NITRITE NEGATIVE 04/04/2017 2114   LEUKOCYTESUR NEGATIVE 04/04/2017 2114   Sepsis Labs: @LABRCNTIP (procalcitonin:4,lacticidven:4)  ) Recent Results (from the past 240 hour(s))  MRSA PCR Screening     Status: None   Collection Time: 04/05/17  5:21 AM  Result Value Ref Range Status   MRSA by PCR NEGATIVE NEGATIVE Final    Comment:        The GeneXpert MRSA Assay (FDA approved for NASAL specimens only), is one component of a comprehensive MRSA colonization surveillance program. It is not intended to diagnose MRSA infection nor to guide  or monitor treatment for MRSA infections.       Radiology Studies: Dg Chest 2 View  Result Date: 04/04/2017 CLINICAL DATA:  Shortness of Breath EXAM: CHEST  2 VIEW COMPARISON:  03/21/2017 FINDINGS: Cardiac shadow is stable. Aortic calcifications are again seen. The lungs are well aerated bilaterally without focal infiltrate or sizable effusion. No acute bony abnormality is seen. IMPRESSION: No active cardiopulmonary disease. Electronically Signed   By: Inez Catalina M.D.   On: 04/04/2017 18:51   Ct Head  Wo Contrast  Result Date: 04/04/2017 CLINICAL DATA:  Generalized weakness. EXAM: CT HEAD WITHOUT CONTRAST TECHNIQUE: Contiguous axial images were obtained from the base of the skull through the vertex without intravenous contrast. COMPARISON:  Head CT 02/11/2017 and brain MRI 07/27/2015 FINDINGS: Brain: No mass lesion, intraparenchymal hemorrhage or extra-axial collection. No evidence of acute cortical infarct. There is advanced global atrophy without specific lobar predilection. Extensive periventricular hypoattenuation consistent with chronic ischemic microangiopathy. Vascular: Atherosclerotic calcification of the vertebral and internal carotid arteries at the skull base. Skull: Normal visualized skull base, calvarium and extracranial soft tissues. Sinuses/Orbits: No sinus fluid levels or advanced mucosal thickening. No mastoid effusion. Normal orbits. IMPRESSION: Advanced atrophy and sequelae of chronic ischemic microangiopathy without acute abnormality. Electronically Signed   By: Ulyses Jarred M.D.   On: 04/04/2017 21:54     Scheduled Meds: . aspirin  81 mg Oral Daily  . enoxaparin (LOVENOX) injection  40 mg Subcutaneous Q24H  . feeding supplement (ENSURE ENLIVE)  237 mL Oral BID BM  . finasteride  5 mg Oral Daily  . levobunolol  2 drop Both Eyes Daily  . pantoprazole  40 mg Oral Daily  . tamsulosin  0.8 mg Oral QHS   Continuous Infusions: . sodium chloride 75 mL/hr at 04/05/17 1102      LOS: 0 days    Time spent: 30 min    Janece Canterbury, MD [facility] Pager [phone]  If 7PM-7AM, please contact night-coverage [email or web address] Password [email or web address] 04/05/2017, 2:07 PM

## 2017-04-05 NOTE — H&P (Signed)
TRH H&P    Patient Demographics:    Lance Kemp, is a 83 y.o. male  MRN: [id number]  DOB - 1923-05-16  Admit Date - 04/04/2017  Referring MD/NP/PA: Dr. Thurnell Garbe  Outpatient Primary MD for the patient is McGowen, [name], MD  Patient coming from: home  Chief Complaint  Patient presents with  . Shortness of Breath      HPI:    Lance Kemp  is a 83 y.o. male, with history of chronic diastolic CHF, basal cell carcinoma, CK D stage III came to hospital with generalized weakness.patient denies chest pain, no shortness of breath. No nausea vomiting or diarrhea. Patient says that he went to PCP and was found to have blood pressure of 105/55 and was sent to ED for further evaluation. In the ED, no significant abnormality was found on lab work Chest x-ray showed no infiltrate CT head shows no acute abnormality   Review of systems:    In addition to the HPI above,  No Fever-chills, No Headache, No changes with Vision or hearing, No problems swallowing food or Liquids, No Chest pain, Cough or Shortness of Breath, No Abdominal pain, No Nausea or Vomiting, bowel movements are regular, No Blood in stool or Urine, No dysuria, No new skin rashes or bruises, No new joints pains-aches,  No new weakness, tingling, numbness in any extremity, No recent weight gain or loss, No polyuria, polydypsia or polyphagia, No significant Mental Stressors.  All other systems reviewed and are negative.   With Past History of the following :    Past Medical History:  Diagnosis Date  . Basal cell carcinoma 2011; 2016   Face  . BPH with obstruction/lower urinary tract symptoms   . Cervical spondylosis    with miltilevel changes leading to narrowing or compression of spinal nerve foramina.  +spinal stenosis.  Mild spondylolisthesis L7-T1.  Marland Kitchen Chronic back pain   . Chronic diastolic heart failure ([place])   . Chronic  left shoulder pain 09/14/2013  . Chronic renal insufficiency, stage III (moderate) (H[facility]) 2013   CrCl in the 50s-60s  . Chronic venous insufficiency    Compression hose  . 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  No significant residual deficit except short term memory impairment  . Dementia 2016   Dr. Delice Lesch 08/2014--started aricept but pt did not tolerate.  Exelon trial started by Dr. Delice Lesch 05/2016.  Marland Kitchen Depression   . Frequent headaches   . Glaucoma   . Hypertension   . Normocytic anemia 2015   Secondary to CRI (iron studies ok 12/2013)  . NSTEMI (non-ST elevated myocardial infarction) ([place]) 06/2011   [place]: Troponin mildly elevated, normal EKG, normal ECHO, pt declined cardiology consult--per old PCP records.  Marland Kitchen OAB (overactive bladder)   . Osteoarthritis   . Peripheral neuropathy 2013   Dx'd by neuro in [place] at the time the patient was hospitalized briefly for pontine CVA.  Marland Kitchen Urine incontinence   . Vertigo       Past Surgical History:  Procedure Laterality Date  . CHOLECYSTECTOMY    .  NM VQ LUNG SCAN ([place] [facility])  09/2014   Low risk  . TONSILECTOMY, ADENOIDECTOMY, BILATERAL MYRINGOTOMY AND TUBES    . TRANSTHORACIC ECHOCARDIOGRAM  12/18/13   Grade 1 diast dysf, o/w normal      Social History:      Social History  Substance Use Topics  . Smoking status: Former Smoker    Types: Pipe  . Smokeless tobacco: Never Used  . Alcohol use No       Family History :     Family History  Problem Relation Age of Onset  . Cancer Mother   . Heart disease Mother       Home Medications:   Prior to Admission medications   Medication Sig Start Date End Date Taking? Authorizing Provider  aspirin 81 MG chewable tablet Chew 81 mg by mouth daily.   Yes [provider]  azelastine (OPTIVAR) 0.05 % ophthalmic solution Place 2 drops into both eyes every evening.   Yes [provider]  finasteride (PROSCAR) 5 MG tablet TAKE ONE TABLET BY MOUTH EVERY DAY 01/25/17   Yes McGowen, [name], MD  levobunolol (BETAGAN) 0.5 % ophthalmic solution Place 1 drop into both eyes 2 (two) times daily. Patient taking differently: Place 2 drops into both eyes daily.  06/21/16  Yes McGowen, [name], MD  Multiple Vitamins-Minerals (ICAPS AREDS 2 PO) Take 1 capsule by mouth 2 (two) times daily.   Yes [provider]  omeprazole (PRILOSEC) 40 MG capsule Take 1 capsule (40 mg total) by mouth daily. 03/21/17  Yes McGowen, [name], MD  polyethylene glycol powder (GLYCOLAX/MIRALAX) powder Take 17 g by mouth 2 (two) times daily as needed. 06/21/16  Yes McGowen, [name], MD  tamsulosin (FLOMAX) 0.4 MG CAPS capsule TAKE 2 CAPSULES BY MOUTH AT BEDTIME 03/23/17  Yes McGowen, [name], MD     Allergies:     Allergies  Allergen Reactions  . Altace [Ramipril] Other (See Comments)    Reaction:  Unknown   . Amoxicillin Other (See Comments)    Reaction:  Unknown  Has patient had a PCN reaction causing immediate rash, facial/tongue/throat swelling, SOB or lightheadedness with hypotension: Unsure Has patient had a PCN reaction causing severe rash involving mucus membranes or skin necrosis: Unsure Has patient had a PCN reaction that required hospitalization Unsure Has patient had a PCN reaction occurring within the last 10 years: Unsure If all of the above answers are "NO", then may proceed with Cephalosporin use.  [name] Potassium] Other (See Comments)    Reaction:  Unknown   . Tramadol Other (See Comments)    Makes patient feel very drowsy/almost too strong to take     Physical Exam:   Vitals  Blood pressure (!) 147/51, pulse 66, temperature 97.9 F (36.6 C), temperature source Oral, resp. rate 18, height 5\' 7"  (1.702 m), weight 63 kg (139 lb), SpO2 96 %.  1.  General: Appears in no acute distress  2. Psychiatric:  Intact judgement and  insight, awake alert, oriented x 3.  3. Neurologic: No focal neurological deficits, all cranial nerves  intact.Strength 5/5 all 4 extremities, sensation intact all 4 extremities, plantars down going.  4. Eyes :  anicteric sclerae, moist conjunctivae with no lid lag. PERRLA.  5. ENMT:  Oropharynx clear with moist mucous membranes and good dentition  6. Neck:  supple, no cervical lymphadenopathy appriciated, No thyromegaly  7. Respiratory : Normal respiratory effort, good air movement bilaterally,clear to  auscultation bilaterally  8. Cardiovascular : RRR, no gallops, rubs or murmurs, no leg edema  9. Gastrointestinal:  Positive bowel sounds, abdomen soft, non-tender to palpation,no hepatosplenomegaly, no rigidity or guarding       10. Skin:  No cyanosis, normal texture and turgor, no rash, lesions or ulcers  11.Musculoskeletal:  Good muscle tone,  joints appear normal , no effusions,  normal range of motion    Data Review:    CBC  Recent Labs Lab 04/04/17 2049  WBC 9.0  HGB 12.5*  HCT 36.4*  PLT 206  MCV 90.8  MCH 31.2  MCHC 34.3  RDW 13.0  LYMPHSABS 3.1  MONOABS 0.9  EOSABS 0.2  BASOSABS 0.0   ------------------------------------------------------------------------------------------------------------------  Chemistries   Recent Labs Lab 04/04/17 2049  NA 138  K 3.6  CL 103  CO2 24  GLUCOSE 107*  BUN 16  CREATININE 1.08  CALCIUM 8.9  AST 38  ALT 29  ALKPHOS 90  BILITOT 1.4*   ------------------------------------------------------------------------------------------------------------------  ------------------------------------------------------------------------------------------------------------------ GFR: Estimated Creatinine Clearance: 39 mL/min (by C-G formula based on SCr of 1.08 mg/dL). Liver Function Tests:  Recent Labs Lab 04/04/17 2049  AST 38  ALT 29  ALKPHOS 90  BILITOT 1.4*  PROT 6.1*  ALBUMIN 3.5    Recent Labs Lab 04/04/17 2049  LIPASE 20   No results for input(s): AMMONIA in the last 168 hours. Coagulation  Profile: No results for input(s): INR, PROTIME in the last 168 hours. Cardiac Enzymes:  Recent Labs Lab 04/04/17 2049  TROPONINI <0.03   BNP (last 3 results) No results for input(s): PROBNP in the last 8760 hours. HbA1C: No results for input(s): HGBA1C in the last 72 hours. CBG: No results for input(s): GLUCAP in the last 168 hours. Lipid Profile: No results for input(s): CHOL, HDL, LDLCALC, TRIG, CHOLHDL, LDLDIRECT in the last 72 hours. Thyroid Function Tests: No results for input(s): TSH, T4TOTAL, FREET4, T3FREE, THYROIDAB in the last 72 hours. Anemia Panel: No results for input(s): VITAMINB12, FOLATE, FERRITIN, TIBC, IRON, RETICCTPCT in the last 72 hours.  --------------------------------------------------------------------------------------------------------------- Urine analysis:    Component Value Date/Time   COLORURINE STRAW (A) 04/04/2017 2114   APPEARANCEUR CLEAR 04/04/2017 2114   LABSPEC 1.005 04/04/2017 2114   PHURINE 7.0 04/04/2017 2114   GLUCOSEU NEGATIVE 04/04/2017 2114   HGBUR NEGATIVE 04/04/2017 2114   BILIRUBINUR NEGATIVE 04/04/2017 2114   BILIRUBINUR neg 09/24/2015 1504   KETONESUR 5 (A) 04/04/2017 2114   PROTEINUR NEGATIVE 04/04/2017 2114   UROBILINOGEN 0.2 09/24/2015 1504   UROBILINOGEN 0.2 09/10/2014 1052   NITRITE NEGATIVE 04/04/2017 2114   LEUKOCYTESUR NEGATIVE 04/04/2017 2114      Imaging Results:    Dg Chest 2 View  Result Date: 04/04/2017 CLINICAL DATA:  Shortness of Breath EXAM: CHEST  2 VIEW COMPARISON:  03/21/2017 FINDINGS: Cardiac shadow is stable. Aortic calcifications are again seen. The lungs are well aerated bilaterally without focal infiltrate or sizable effusion. No acute bony abnormality is seen. IMPRESSION: No active cardiopulmonary disease. Electronically Signed   By: Inez Catalina M.D.   On: 04/04/2017 18:51   Ct Head Wo Contrast  Result Date: 04/04/2017 CLINICAL DATA:  Generalized weakness. EXAM: CT HEAD WITHOUT CONTRAST  TECHNIQUE: Contiguous axial images were obtained from the base of the skull through the vertex without intravenous contrast. COMPARISON:  Head CT 02/11/2017 and brain MRI 07/27/2015 FINDINGS: Brain: No mass lesion, intraparenchymal hemorrhage or extra-axial collection. No evidence of acute cortical infarct. There is advanced global atrophy without specific lobar predilection. Extensive periventricular  hypoattenuation consistent with chronic ischemic microangiopathy. Vascular: Atherosclerotic calcification of the vertebral and internal carotid arteries at the skull base. Skull: Normal visualized skull base, calvarium and extracranial soft tissues. Sinuses/Orbits: No sinus fluid levels or advanced mucosal thickening. No mastoid effusion. Normal orbits. IMPRESSION: Advanced atrophy and sequelae of chronic ischemic microangiopathy without acute abnormality. Electronically Signed   By: Ulyses Jarred M.D.   On: 04/04/2017 21:54    My personal review of EKG: Rhythm NSR   Assessment & Plan:    Active Problems:   Generalized weakness   1. Generalized weakness- place  under observation,start IV normal saline at 75 ML per hour.lactic acid was 2.5. Patient will need social work consult, as he lives by himself. 2. BPH-continue Flomax    DVT Prophylaxis-   Lovenox   AM Labs Ordered, also please review Full Orders  Family Communication: Admission, patients condition and plan of care including tests being ordered have been discussed with the patient  who indicate understanding and agree with the plan and Code Status.  Code Status: full code  Admission status: observation    Time spent in minutes : 60 minutes   Adelena Desantiago S M.D on 04/05/2017 at 3:20 AM  Between 7am to 7pm - Pager - [phone]. After 7pm go to [email or web address] - password [facility]  [facility] - Office  [phone]

## 2017-04-06 DIAGNOSIS — R531 Weakness: Secondary | ICD-10-CM | POA: Diagnosis not present

## 2017-04-06 DIAGNOSIS — R110 Nausea: Secondary | ICD-10-CM | POA: Diagnosis not present

## 2017-04-06 DIAGNOSIS — R630 Anorexia: Secondary | ICD-10-CM | POA: Diagnosis not present

## 2017-04-06 LAB — URINE CULTURE: CULTURE: NO GROWTH

## 2017-04-06 MED ORDER — ENSURE ENLIVE PO LIQD: 237.0000 mL | Freq: Two times a day (BID) | ORAL | 0 refills | Status: DC

## 2017-04-06 MED ORDER — ONDANSETRON HCL 4 MG PO TABS: 4.0000 mg | ORAL_TABLET | Freq: Three times a day (TID) | ORAL | 0 refills | Status: AC

## 2017-04-06 NOTE — Discharge Summary (Signed)
Physician Discharge Summary  [name] OZD:[id number] DOB: [birth year] DOA: 04/04/2017  PCP: [name], MD  Admit date: 04/04/2017 Discharge date: 04/06/2017  Admitted From: home  Disposition:  home  Recommendations for Outpatient Follow-up:  1. Follow up with PCP in 1 week.  Consider palliative care consultation   Home Health:  PT, OT, RN, aid  Equipment/Devices:  none  Discharge Condition:  Stable, improved CODE STATUS:  DNR  Diet recommendation:  Regular with supplements   Brief/Interim Summary:  WilliamClarkeis a 83 y.o.male,with history of chronic diastolic CHF, basal cell carcinoma, CKD stage III, chronic nausea who presents with acutely worsened anorexia, nausea, and generalized weakness.  Patient was seen by his PCP who was unable to obtain bloodwork in the office, so that patient was referred to the ER for blood work and IVF.  Orthostatics were positive in the ER.  Minimal PO intake so far today and ongoing nausea.    Discharge Diagnoses:  Active Problems:   Generalized weakness  Anorexia with nausea and 13-lbs weight loss over the last two months.  May have underlying malignancy.  Has known basal cell carcinoma on his face.  He did not appear to have any infections or be constipated.  He could have developed chronic mesenteric ischemia, but he does not complain of abdominal pain on ROS or on exam.  He was rehydrated and denied orthostasis symptoms when working with PT.   -  Urine culture negative and CXR without infiltrate -  Sed rate a month ago was low -  TSH 3.4 -  Agree with PPI -  Added zofran prior to meals -  KUB was negative for retained stool -  Regular diet -  Added supplements -  If he starts complaining of abdominal pain, consider CT angio abd  -  Could also consider a trial of mirtazapine 7.5mg  qhs as appetite stimulant and sleep aid but will defer to PCP  Dehydration/positive orthostatics.   -  Given IVF and repeat orthostatic vital  signs were negative -  Discontinued flomax -  Recommended TED hose -  Patient needs assistance with sitting up and transfers and needs 24 hour assistance  Generalized weakness, may be due to malnutrition/weight loss, underlying malignancy, worsening dementia and more acutely due to dehydration -  PT recommended SNF, however, patient does not have qualifying stay  -  Spoke with son about additional assistance at home and arranged home health services -  Telemetry demonstrated PAF but not with RVR and otherwise with NSR or mild sinus bradycardia -  Troponin was negative -  No evidence of heart failure on exam  Dementia, oriented only to person, picking at clothes and unable to put his arm in his gown sleeve without assistance due to confusion.   -  Likely contributing to aforementioned problems  Anemia of chronic disease, iron, vitamin b12, folate just completed less than two months ago -  Hemoglobin at baseline of 10-12g/dl  Hx of CVA, continue aspirin  Chronic renal insufficiency stage III, creatinine at baseline  Discharge Instructions  Discharge Instructions    Call MD for:  difficulty breathing, headache or visual disturbances    Complete by:  As directed    Call MD for:  extreme fatigue    Complete by:  As directed    Call MD for:  persistant dizziness or light-headedness    Complete by:  As directed    Call MD for:  persistant nausea and vomiting    Complete by:  As directed    Call MD for:  severe uncontrolled pain    Complete by:  As directed    Diet general    Complete by:  As directed    Increase activity slowly    Complete by:  As directed        Medication List    STOP taking these medications   tamsulosin 0.4 MG Caps capsule Commonly known as:  FLOMAX     TAKE these medications   aspirin 81 MG chewable tablet Chew 81 mg by mouth daily.   azelastine 0.05 % ophthalmic solution Commonly known as:  OPTIVAR Place 2 drops into both eyes every  evening.   feeding supplement (ENSURE ENLIVE) Liqd Take 237 mLs by mouth 2 (two) times daily between meals.   finasteride 5 MG tablet Commonly known as:  PROSCAR TAKE ONE TABLET BY MOUTH EVERY DAY   ICAPS AREDS 2 PO Take 1 capsule by mouth 2 (two) times daily.   levobunolol 0.5 % ophthalmic solution Commonly known as:  BETAGAN Place 1 drop into both eyes 2 (two) times daily. What changed:  how much to take  when to take this   omeprazole 40 MG capsule Commonly known as:  PRILOSEC Take 1 capsule (40 mg total) by mouth daily.   ondansetron 4 MG tablet Commonly known as:  ZOFRAN Take 1 tablet (4 mg total) by mouth 4 (four) times daily -  before meals and at bedtime.   polyethylene glycol powder powder Commonly known as:  GLYCOLAX/MIRALAX Take 17 g by mouth 2 (two) times daily as needed.      Follow-up Information    McGowen, [name], MD. Schedule an appointment as soon as possible for a visit in 1 week(s).   Specialty:  Family Medicine Contact information: [address] [place]  [address] [phone]          Allergies  Allergen Reactions  . Altace [Ramipril] Other (See Comments)    Reaction:  Unknown   . Amoxicillin Other (See Comments)    Reaction:  Unknown  Has patient had a PCN reaction causing immediate rash, facial/tongue/throat swelling, SOB or lightheadedness with hypotension: Unsure Has patient had a PCN reaction causing severe rash involving mucus membranes or skin necrosis: Unsure Has patient had a PCN reaction that required hospitalization Unsure Has patient had a PCN reaction occurring within the last 10 years: Unsure If all of the above answers are "NO", then may proceed with Cephalosporin use.  [name] Potassium] Other (See Comments)    Reaction:  Unknown   . Tramadol Other (See Comments)    Makes patient feel very drowsy/almost too strong to take    Consultations: none   Procedures/Studies: Dg Chest 2  View  Result Date: 04/04/2017 CLINICAL DATA:  Shortness of Breath EXAM: CHEST  2 VIEW COMPARISON:  03/21/2017 FINDINGS: Cardiac shadow is stable. Aortic calcifications are again seen. The lungs are well aerated bilaterally without focal infiltrate or sizable effusion. No acute bony abnormality is seen. IMPRESSION: No active cardiopulmonary disease. Electronically Signed   By: Inez Catalina M.D.   On: 04/04/2017 18:51   Dg Chest 2 View  Result Date: 03/22/2017 CLINICAL DATA:  Shortness of breath EXAM: CHEST  2 VIEW COMPARISON:  02/11/2017 FINDINGS: Normal heart size and stable mediastinal contours. There is no edema, consolidation, effusion, or pneumothorax. Cholecystectomy clips. IMPRESSION: No evidence of active disease.  Stable compared to prior. Electronically Signed   By: Monte Fantasia  M.D.   On: 03/22/2017 08:52   Ct Head Wo Contrast  Result Date: 04/04/2017 CLINICAL DATA:  Generalized weakness. EXAM: CT HEAD WITHOUT CONTRAST TECHNIQUE: Contiguous axial images were obtained from the base of the skull through the vertex without intravenous contrast. COMPARISON:  Head CT 02/11/2017 and brain MRI 07/27/2015 FINDINGS: Brain: No mass lesion, intraparenchymal hemorrhage or extra-axial collection. No evidence of acute cortical infarct. There is advanced global atrophy without specific lobar predilection. Extensive periventricular hypoattenuation consistent with chronic ischemic microangiopathy. Vascular: Atherosclerotic calcification of the vertebral and internal carotid arteries at the skull base. Skull: Normal visualized skull base, calvarium and extracranial soft tissues. Sinuses/Orbits: No sinus fluid levels or advanced mucosal thickening. No mastoid effusion. Normal orbits. IMPRESSION: Advanced atrophy and sequelae of chronic ischemic microangiopathy without acute abnormality. Electronically Signed   By: Ulyses Jarred M.D.   On: 04/04/2017 21:54   Dg Abd Portable 1v  Result Date:  04/05/2017 CLINICAL DATA:  Generalized weakness, nausea EXAM: PORTABLE ABDOMEN - 1 VIEW COMPARISON:  Abdomen films of 08/28/2014 FINDINGS: A portable supine view of the abdomen shows no bowel obstruction. No radiographic evidence of constipation is seen. Surgical clips are noted in the right upper quadrant from prior cholecystectomy. There are degenerative changes in the mid to lower lumbar spine. IMPRESSION: 1. No bowel obstruction.  No radiographic evidence of constipation. 2. Degenerative change in the mid to lower lumbar spine. Electronically Signed   By: Ivar Drape M.D.   On: 04/05/2017 16:08   Subjective:  Confused today.  Denies nausea but states his stomach does not feel right .    Discharge Exam: Vitals:   04/06/17 0500 04/06/17 1300  BP: 132/68 (!) 128/56  Pulse: 65 (!) 48  Resp: 18 18  Temp: 98.7 F (37.1 C) 98 F (36.7 C)  SpO2: 99% 100%   Vitals:   04/05/17 2035 04/05/17 2201 04/06/17 0500 04/06/17 1300  BP:  (!) 171/75 132/68 (!) 128/56  Pulse:  60 65 (!) 48  Resp:  15 18 18   Temp:  98.9 F (37.2 C) 98.7 F (37.1 C) 98 F (36.7 C)  TempSrc:  Oral Oral Oral  SpO2: 94% 100% 99% 100%  Weight:  60.9 kg (134 lb 4.8 oz) 61.2 kg (134 lb 14.4 oz)   Height:        General: Pt is alert, awake, not in acute distress Cardiovascular: RRR, S1/S2 +, no rubs, no gallops Respiratory: CTA bilaterally, no wheezing, no rhonchi Abdominal: Soft, NT, ND, bowel sounds + Extremities: no edema, no cyanosis    The results of significant diagnostics from this hospitalization (including imaging, microbiology, ancillary and laboratory) are listed below for reference.     Microbiology: Recent Results (from the past 240 hour(s))  Urine culture     Status: None   Collection Time: 04/04/17  9:14 PM  Result Value Ref Range Status   Specimen Description URINE, RANDOM  Final   Special Requests NONE  Final   Culture   Final    NO GROWTH Performed at [facility], [address] [place], [place] [address]    Report Status 04/06/2017 FINAL  Final  MRSA PCR Screening     Status: None   Collection Time: 04/05/17  5:21 AM  Result Value Ref Range Status   MRSA by PCR NEGATIVE NEGATIVE Final    Comment:        The GeneXpert MRSA Assay (FDA approved for NASAL specimens only), is one component of a  comprehensive MRSA colonization surveillance program. It is not intended to diagnose MRSA infection nor to guide or monitor treatment for MRSA infections.      Labs: BNP (last 3 results)  Recent Labs  04/19/16 0706 02/11/17 1933  BNP 55.4 683.4*   Basic Metabolic Panel:  Recent Labs Lab 04/04/17 2049 04/05/17 0611  NA 138 139  K 3.6 3.9  CL 103 105  CO2 24 25  GLUCOSE 107* 98  BUN 16 15  CREATININE 1.08 1.01  CALCIUM 8.9 8.6*   Liver Function Tests:  Recent Labs Lab 04/04/17 2049 04/05/17 0611  AST 38 34  ALT 29 27  ALKPHOS 90 85  BILITOT 1.4* 1.8*  PROT 6.1* 6.0*  ALBUMIN 3.5 3.2*    Recent Labs Lab 04/04/17 2049  LIPASE 20   No results for input(s): AMMONIA in the last 168 hours. CBC:  Recent Labs Lab 04/04/17 2049 04/05/17 0611  WBC 9.0 7.9  NEUTROABS 4.7  --   HGB 12.5* 12.3*  HCT 36.4* 35.4*  MCV 90.8 90.1  PLT 206 183   Cardiac Enzymes:  Recent Labs Lab 04/04/17 2049  TROPONINI <0.03   BNP: Invalid input(s): POCBNP CBG: No results for input(s): GLUCAP in the last 168 hours. D-Dimer No results for input(s): DDIMER in the last 72 hours. Hgb A1c No results for input(s): HGBA1C in the last 72 hours. Lipid Profile No results for input(s): CHOL, HDL, LDLCALC, TRIG, CHOLHDL, LDLDIRECT in the last 72 hours. Thyroid function studies  Recent Labs  04/05/17 1122  TSH 3.402   Anemia work up No results for input(s): VITAMINB12, FOLATE, FERRITIN, TIBC, IRON, RETICCTPCT in the last 72 hours. Urinalysis    Component Value Date/Time   COLORURINE STRAW (A) 04/04/2017 2114   APPEARANCEUR CLEAR 04/04/2017 2114    LABSPEC 1.005 04/04/2017 2114   PHURINE 7.0 04/04/2017 2114   GLUCOSEU NEGATIVE 04/04/2017 2114   HGBUR NEGATIVE 04/04/2017 2114   BILIRUBINUR NEGATIVE 04/04/2017 2114   BILIRUBINUR neg 09/24/2015 1504   KETONESUR 5 (A) 04/04/2017 2114   PROTEINUR NEGATIVE 04/04/2017 2114   UROBILINOGEN 0.2 09/24/2015 1504   UROBILINOGEN 0.2 09/10/2014 1052   NITRITE NEGATIVE 04/04/2017 2114   LEUKOCYTESUR NEGATIVE 04/04/2017 2114   Sepsis Labs Invalid input(s): PROCALCITONIN,  WBC,  LACTICIDVEN   Time coordinating discharge: Over 30 minutes  SIGNED:   Janece Canterbury, MD  [facility] 04/06/2017, 1:13 PM Pager   If 7PM-7AM, please contact night-coverage [email or web address] Password [email or web address]

## 2017-04-06 NOTE — Evaluation (Signed)
Physical Therapy Evaluation Patient Details Name: Lance Kemp MRN: [id number] DOB: [birth year] Today's Date: 04/06/2017   History of Present Illness  Lance Kemp  is a 83 y.o. male, with history of chronic diastolic CHF, basal cell carcinoma, CK D stage III came to hospital with generalized weakness.patient denies chest pain, no shortness of breath. No nausea vomiting or diarrhea.    Clinical Impression  Patient very unsteady on feet and high risk for falls.  Patient will benefit from continued physical therapy in hospital and recommended venue below to increase strength, balance, endurance for safe ADLs and gait.    Follow Up Recommendations SNF;Supervision/Assistance - 24 hour    Equipment Recommendations  None recommended by PT    Recommendations for Other Services       Precautions / Restrictions Precautions Precautions: Fall Restrictions Weight Bearing Restrictions: No      Mobility  Bed Mobility Overal bed mobility: Needs Assistance Bed Mobility: Supine to Sit;Sit to Supine     Supine to sit: Min assist Sit to supine: Min assist      Transfers Overall transfer level: Needs assistance Equipment used: Rolling walker (2 wheeled) Transfers: Sit to/from [facility] Sit to Stand: Min assist Stand pivot transfers: Mod assist       General transfer comment: difficulty with sit to stands  Ambulation/Gait Ambulation/Gait assistance: Min assist Ambulation Distance (Feet): 12 Feet Assistive device: Rolling walker (2 wheeled) Gait Pattern/deviations: Decreased step length - right;Decreased step length - left;Decreased stride length;Staggering left;Staggering right Gait velocity: slow   General Gait Details: demonstrates slow labored movement with frequent staggering  Stairs            Wheelchair Mobility    Modified Rankin (Stroke Patients Only)       Balance Overall balance assessment: Needs assistance Sitting-balance support:  Bilateral upper extremity supported;Feet supported Sitting balance-Leahy Scale: Fair     Standing balance support: Bilateral upper extremity supported;During functional activity Standing balance-Leahy Scale: Poor                               Pertinent Vitals/Pain Pain Assessment: Faces Faces Pain Scale: Hurts little more Pain Location: left shoulder with movement/pressure Pain Descriptors / Indicators: Pressure;Sharp Pain Intervention(s): Limited activity within patient's tolerance;Monitored during session    [place] expects to be discharged to:: Private residence Living Arrangements: Alone Available Help at Discharge: Family (lives across the street from patient) Type of Home: House Home Access: [place]: One [place]: Environmental consultant - 2 wheels;Shower seat - built in Additional Comments: Has caregiver that helps with showers 1x/week    Prior Function Level of Independence: Needs assistance   Gait / Transfers Assistance Needed: household distances with RW d/t balance deficits, aide assists with bathing.   ADL's / Homemaking Assistance Needed: assisted by family and home aide        Hand Dominance   Dominant Hand: Right    Extremity/Trunk Assessment   Upper Extremity Assessment Upper Extremity Assessment: Overall WFL for tasks assessed;LUE deficits/detail LUE Deficits / Details: grossly -3/5 secondary to chronic left shoulder pain    Lower Extremity Assessment Lower Extremity Assessment: Generalized weakness       Communication   Communication: No difficulties  Cognition Arousal/Alertness: Awake/alert Behavior During Therapy: WFL for tasks assessed/performed Overall Cognitive Status: Within Functional Limits for tasks assessed  General Comments      Exercises     Assessment/Plan    PT Assessment Patient needs continued PT services  PT  Problem List Decreased strength;Decreased activity tolerance;Decreased balance;Decreased mobility       PT Treatment Interventions Gait training;Functional mobility training;Therapeutic activities;Therapeutic exercise    PT Goals (Current goals can be found in the Care Plan section)  Acute Rehab PT Goals Patient Stated Goal: return home PT Goal Formulation: With patient Time For Goal Achievement: 04/09/17 Potential to Achieve Goals: Good    Frequency Min 3X/week   Barriers to discharge        Co-evaluation               AM-PAC PT "6 Clicks" Daily Activity  Outcome Measure Difficulty turning over in bed (including adjusting bedclothes, sheets and blankets)?: A Little Difficulty moving from lying on back to sitting on the side of the bed? : A Lot Difficulty sitting down on and standing up from a chair with arms (e.g., wheelchair, bedside commode, etc,.)?: A Lot Help needed moving to and from a bed to chair (including a wheelchair)?: A Lot Help needed walking in hospital room?: A Lot Help needed climbing 3-5 steps with a railing? : Total 6 Click Score: 12    End of Session Equipment Utilized During Treatment: Gait belt Activity Tolerance: Patient limited by fatigue Patient left: in chair;with call bell/phone within reach;with chair alarm set Nurse Communication: Mobility status PT Visit Diagnosis: Unsteadiness on feet (R26.81);Other abnormalities of gait and mobility (R26.89);Muscle weakness (generalized) (M62.81)    Time: 2706-2376 PT Time Calculation (min) (ACUTE ONLY): 30 min   Charges:   PT Evaluation $PT Eval Low Complexity: 1 Low PT Treatments $Therapeutic Activity: 23-37 mins   PT G Codes:   PT G-Codes **NOT FOR INPATIENT CLASS** Functional Assessment Tool Used: AM-PAC 6 Clicks Basic Mobility Functional Limitation: Mobility: Walking and moving around Mobility: Walking and Moving Around Current Status (E8315): At least 60 percent but less than 80 percent  impaired, limited or restricted Mobility: Walking and Moving Around Goal Status [phone]): At least 60 percent but less than 80 percent impaired, limited or restricted Mobility: Walking and Moving Around Discharge Status [phone]): At least 60 percent but less than 80 percent impaired, limited or restricted    11:59 AM, 04/06/17 Lonell Grandchild, MPT Physical Therapist with [facility] [phone] office [phone] mobile phone

## 2017-04-09 ENCOUNTER — Telehealth: Payer: Self-pay

## 2017-04-09 NOTE — Telephone Encounter (Signed)
LM with son, Gerald Stabs, requesting call back to schedule hospital f/u within 1 week and complete TCM.

## 2017-04-10 ENCOUNTER — Telehealth: Payer: Self-pay | Admitting: Family Medicine

## 2017-04-10 NOTE — Telephone Encounter (Signed)
FYI:  [facility] called and stated that they were scheduled to go to patient's home today and patient was not home or would not answer door and they called a few times without answer.

## 2017-04-10 NOTE — Telephone Encounter (Signed)
Noted  

## 2017-04-10 NOTE — Telephone Encounter (Signed)
LM requesting call back to complete TCM and confirm hosp f/u appt.  

## 2017-04-11 DIAGNOSIS — I130 Hypertensive heart and chronic kidney disease with heart failure and stage 1 through stage 4 chronic kidney disease, or unspecified chronic kidney disease: Secondary | ICD-10-CM | POA: Diagnosis not present

## 2017-04-11 DIAGNOSIS — M549 Dorsalgia, unspecified: Secondary | ICD-10-CM | POA: Diagnosis not present

## 2017-04-11 DIAGNOSIS — D631 Anemia in chronic kidney disease: Secondary | ICD-10-CM | POA: Diagnosis not present

## 2017-04-11 DIAGNOSIS — R630 Anorexia: Secondary | ICD-10-CM | POA: Diagnosis not present

## 2017-04-11 DIAGNOSIS — M25512 Pain in left shoulder: Secondary | ICD-10-CM | POA: Diagnosis not present

## 2017-04-11 DIAGNOSIS — N183 Chronic kidney disease, stage 3 (moderate): Secondary | ICD-10-CM | POA: Diagnosis not present

## 2017-04-11 DIAGNOSIS — M6281 Muscle weakness (generalized): Secondary | ICD-10-CM | POA: Diagnosis not present

## 2017-04-11 DIAGNOSIS — G8929 Other chronic pain: Secondary | ICD-10-CM | POA: Diagnosis not present

## 2017-04-11 DIAGNOSIS — I5032 Chronic diastolic (congestive) heart failure: Secondary | ICD-10-CM | POA: Diagnosis not present

## 2017-04-11 DIAGNOSIS — Z7982 Long term (current) use of aspirin: Secondary | ICD-10-CM | POA: Diagnosis not present

## 2017-04-11 DIAGNOSIS — I872 Venous insufficiency (chronic) (peripheral): Secondary | ICD-10-CM | POA: Diagnosis not present

## 2017-04-11 DIAGNOSIS — I69311 Memory deficit following cerebral infarction: Secondary | ICD-10-CM | POA: Diagnosis not present

## 2017-04-11 DIAGNOSIS — G629 Polyneuropathy, unspecified: Secondary | ICD-10-CM | POA: Diagnosis not present

## 2017-04-11 DIAGNOSIS — H409 Unspecified glaucoma: Secondary | ICD-10-CM | POA: Diagnosis not present

## 2017-04-11 DIAGNOSIS — Z85828 Personal history of other malignant neoplasm of skin: Secondary | ICD-10-CM | POA: Diagnosis not present

## 2017-04-11 DIAGNOSIS — F0390 Unspecified dementia without behavioral disturbance: Secondary | ICD-10-CM | POA: Diagnosis not present

## 2017-04-11 DIAGNOSIS — N401 Enlarged prostate with lower urinary tract symptoms: Secondary | ICD-10-CM | POA: Diagnosis not present

## 2017-04-12 DIAGNOSIS — I130 Hypertensive heart and chronic kidney disease with heart failure and stage 1 through stage 4 chronic kidney disease, or unspecified chronic kidney disease: Secondary | ICD-10-CM | POA: Diagnosis not present

## 2017-04-12 DIAGNOSIS — N183 Chronic kidney disease, stage 3 (moderate): Secondary | ICD-10-CM | POA: Diagnosis not present

## 2017-04-12 DIAGNOSIS — I69311 Memory deficit following cerebral infarction: Secondary | ICD-10-CM | POA: Diagnosis not present

## 2017-04-12 DIAGNOSIS — I5032 Chronic diastolic (congestive) heart failure: Secondary | ICD-10-CM | POA: Diagnosis not present

## 2017-04-12 DIAGNOSIS — R630 Anorexia: Secondary | ICD-10-CM | POA: Diagnosis not present

## 2017-04-12 DIAGNOSIS — M6281 Muscle weakness (generalized): Secondary | ICD-10-CM | POA: Diagnosis not present

## 2017-04-13 ENCOUNTER — Inpatient Hospital Stay: Payer: Medicare Other | Admitting: Family Medicine

## 2017-04-13 ENCOUNTER — Telehealth: Payer: Self-pay | Admitting: *Deleted

## 2017-04-13 DIAGNOSIS — R630 Anorexia: Secondary | ICD-10-CM | POA: Diagnosis not present

## 2017-04-13 DIAGNOSIS — M6281 Muscle weakness (generalized): Secondary | ICD-10-CM | POA: Diagnosis not present

## 2017-04-13 DIAGNOSIS — I69311 Memory deficit following cerebral infarction: Secondary | ICD-10-CM | POA: Diagnosis not present

## 2017-04-13 DIAGNOSIS — I130 Hypertensive heart and chronic kidney disease with heart failure and stage 1 through stage 4 chronic kidney disease, or unspecified chronic kidney disease: Secondary | ICD-10-CM | POA: Diagnosis not present

## 2017-04-13 DIAGNOSIS — N183 Chronic kidney disease, stage 3 (moderate): Secondary | ICD-10-CM | POA: Diagnosis not present

## 2017-04-13 DIAGNOSIS — I5032 Chronic diastolic (congestive) heart failure: Secondary | ICD-10-CM | POA: Diagnosis not present

## 2017-04-13 NOTE — Telephone Encounter (Signed)
Bruce, OT with [facility] on 04/13/17 at 10:11am requesting v/o for OT: 2 days a week x 3 weeks.  Okay per Dr. Anitra Lauth.   Bruce advised and voiced understanding.

## 2017-04-15 ENCOUNTER — Emergency Department (HOSPITAL_COMMUNITY): Payer: Medicare Other

## 2017-04-15 ENCOUNTER — Inpatient Hospital Stay (HOSPITAL_COMMUNITY)
Admission: EM | Admit: 2017-04-15 | Discharge: 2017-04-19 | DRG: 555 | Disposition: A | Discharge status: Skilled Nursing Facility | Payer: Medicare Other | Attending: Internal Medicine | Admitting: Internal Medicine

## 2017-04-15 ENCOUNTER — Encounter (HOSPITAL_COMMUNITY): Payer: Self-pay | Admitting: *Deleted

## 2017-04-15 DIAGNOSIS — Z9181 History of falling: Secondary | ICD-10-CM

## 2017-04-15 DIAGNOSIS — R296 Repeated falls: Secondary | ICD-10-CM

## 2017-04-15 DIAGNOSIS — G8194 Hemiplegia, unspecified affecting left nondominant side: Secondary | ICD-10-CM | POA: Diagnosis present

## 2017-04-15 DIAGNOSIS — N183 Chronic kidney disease, stage 3 unspecified: Secondary | ICD-10-CM | POA: Diagnosis present

## 2017-04-15 DIAGNOSIS — F03A0 Unspecified dementia, mild, without behavioral disturbance, psychotic disturbance, mood disturbance, and anxiety: Secondary | ICD-10-CM | POA: Diagnosis present

## 2017-04-15 DIAGNOSIS — Z8673 Personal history of transient ischemic attack (TIA), and cerebral infarction without residual deficits: Secondary | ICD-10-CM

## 2017-04-15 DIAGNOSIS — Z79899 Other long term (current) drug therapy: Secondary | ICD-10-CM

## 2017-04-15 DIAGNOSIS — Z87891 Personal history of nicotine dependence: Secondary | ICD-10-CM

## 2017-04-15 DIAGNOSIS — Z8249 Family history of ischemic heart disease and other diseases of the circulatory system: Secondary | ICD-10-CM

## 2017-04-15 DIAGNOSIS — N401 Enlarged prostate with lower urinary tract symptoms: Secondary | ICD-10-CM | POA: Diagnosis present

## 2017-04-15 DIAGNOSIS — R9431 Abnormal electrocardiogram [ECG] [EKG]: Secondary | ICD-10-CM | POA: Diagnosis not present

## 2017-04-15 DIAGNOSIS — R42 Dizziness and giddiness: Secondary | ICD-10-CM

## 2017-04-15 DIAGNOSIS — I480 Paroxysmal atrial fibrillation: Secondary | ICD-10-CM | POA: Diagnosis present

## 2017-04-15 DIAGNOSIS — M25512 Pain in left shoulder: Secondary | ICD-10-CM | POA: Diagnosis not present

## 2017-04-15 DIAGNOSIS — R531 Weakness: Secondary | ICD-10-CM | POA: Diagnosis not present

## 2017-04-15 DIAGNOSIS — E43 Unspecified severe protein-calorie malnutrition: Secondary | ICD-10-CM | POA: Diagnosis present

## 2017-04-15 DIAGNOSIS — I130 Hypertensive heart and chronic kidney disease with heart failure and stage 1 through stage 4 chronic kidney disease, or unspecified chronic kidney disease: Secondary | ICD-10-CM | POA: Diagnosis not present

## 2017-04-15 DIAGNOSIS — R351 Nocturia: Secondary | ICD-10-CM | POA: Diagnosis present

## 2017-04-15 DIAGNOSIS — M25561 Pain in right knee: Secondary | ICD-10-CM | POA: Diagnosis not present

## 2017-04-15 DIAGNOSIS — R64 Cachexia: Secondary | ICD-10-CM | POA: Diagnosis present

## 2017-04-15 DIAGNOSIS — H409 Unspecified glaucoma: Secondary | ICD-10-CM | POA: Diagnosis present

## 2017-04-15 DIAGNOSIS — E785 Hyperlipidemia, unspecified: Secondary | ICD-10-CM | POA: Diagnosis present

## 2017-04-15 DIAGNOSIS — I5032 Chronic diastolic (congestive) heart failure: Secondary | ICD-10-CM | POA: Diagnosis present

## 2017-04-15 DIAGNOSIS — R51 Headache: Secondary | ICD-10-CM | POA: Diagnosis not present

## 2017-04-15 DIAGNOSIS — Z043 Encounter for examination and observation following other accident: Secondary | ICD-10-CM | POA: Diagnosis not present

## 2017-04-15 DIAGNOSIS — I639 Cerebral infarction, unspecified: Secondary | ICD-10-CM | POA: Diagnosis not present

## 2017-04-15 DIAGNOSIS — F0390 Unspecified dementia without behavioral disturbance: Secondary | ICD-10-CM | POA: Diagnosis not present

## 2017-04-15 DIAGNOSIS — W19XXXA Unspecified fall, initial encounter: Secondary | ICD-10-CM

## 2017-04-15 DIAGNOSIS — I252 Old myocardial infarction: Secondary | ICD-10-CM

## 2017-04-15 DIAGNOSIS — R404 Transient alteration of awareness: Secondary | ICD-10-CM | POA: Diagnosis not present

## 2017-04-15 DIAGNOSIS — Z7982 Long term (current) use of aspirin: Secondary | ICD-10-CM

## 2017-04-15 DIAGNOSIS — S0990XA Unspecified injury of head, initial encounter: Secondary | ICD-10-CM | POA: Diagnosis not present

## 2017-04-15 DIAGNOSIS — R748 Abnormal levels of other serum enzymes: Secondary | ICD-10-CM | POA: Diagnosis present

## 2017-04-15 DIAGNOSIS — C44310 Basal cell carcinoma of skin of unspecified parts of face: Secondary | ICD-10-CM | POA: Diagnosis present

## 2017-04-15 DIAGNOSIS — I872 Venous insufficiency (chronic) (peripheral): Secondary | ICD-10-CM | POA: Diagnosis present

## 2017-04-15 DIAGNOSIS — I10 Essential (primary) hypertension: Secondary | ICD-10-CM | POA: Diagnosis present

## 2017-04-15 DIAGNOSIS — Z66 Do not resuscitate: Secondary | ICD-10-CM | POA: Diagnosis present

## 2017-04-15 DIAGNOSIS — Z85828 Personal history of other malignant neoplasm of skin: Secondary | ICD-10-CM

## 2017-04-15 DIAGNOSIS — G8929 Other chronic pain: Secondary | ICD-10-CM | POA: Diagnosis present

## 2017-04-15 DIAGNOSIS — Z6821 Body mass index (BMI) 21.0-21.9, adult: Secondary | ICD-10-CM

## 2017-04-15 NOTE — ED Triage Notes (Addendum)
Pt brought in by [facility] for c/o fall; pt went to check on pt and found pt lying on the floor; family reported to ems that pt has been more confused than usual; pt has multiple bruises to arms and legs; pt has large bruise to right buttock and redness to sacral area

## 2017-04-15 NOTE — ED Notes (Signed)
ED Provider at bedside. Dr. Horton 

## 2017-04-15 NOTE — ED Provider Notes (Signed)
[facility] EMERGENCY DEPARTMENT Provider Note   CSN: [id number] Arrival date & time: 04/15/17  2007     History   Chief Complaint Chief Complaint  Patient presents with  . Fall    HPI Joon Pohle is a 83 y.o. male.  HPI  Level 5 caveat for dementia  This is a 83 year old male who presents after reported fall.  Patient lives across the street from his son.  Per report, his son found him on the ground.  Patient is unable to provide any history regarding events that led up to the fall.  He reports "pain all over."  He is oriented only to himself.  Past Medical History:  Diagnosis Date  . Basal cell carcinoma 2011; 2016   Face  . BPH with obstruction/lower urinary tract symptoms   . Cervical spondylosis    with miltilevel changes leading to narrowing or compression of spinal nerve foramina.  +spinal stenosis.  Mild spondylolisthesis L7-T1.  Marland Kitchen Chronic back pain   . Chronic diastolic heart failure ([place])   . Chronic left shoulder pain 09/14/2013  . Chronic renal insufficiency, stage III (moderate) (HCC) 2013   CrCl in the 50s-60s  . Chronic venous insufficiency    Compression hose  . 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  No significant residual deficit except short term memory impairment  . Dementia 2016   Dr. Delice Lesch 08/2014--started aricept but pt did not tolerate.  Exelon trial started by Dr. Delice Lesch 05/2016.  Marland Kitchen Depression   . Frequent headaches   . Glaucoma   . Hypertension   . Normocytic anemia 2015   Secondary to CRI (iron studies ok 12/2013)  . NSTEMI (non-ST elevated myocardial infarction) ([place]) 06/2011   [place]: Troponin mildly elevated, normal EKG, normal ECHO, pt declined cardiology consult--per old PCP records.  Marland Kitchen OAB (overactive bladder)   . Osteoarthritis   . Peripheral neuropathy 2013   Dx'd by neuro in [place] at the time the patient was hospitalized briefly for pontine CVA.  Marland Kitchen Urine incontinence   . Vertigo     Patient Active Problem List    Diagnosis Date Noted  . Generalized weakness 04/05/2017  . Vertigo 02/04/2017  . Cellulitis of left lower extremity 04/25/2016  . Cellulitis 04/25/2016  . Basal cell carcinoma, face 04/07/2015  . Mild dementia 11/24/2014  . OAB (overactive bladder) 10/17/2014  . Essential hypertension 08/25/2014  . Hyperlipidemia 08/25/2014  . Peripheral edema 04/16/2014  . Chronic diastolic heart failure ([place]) 03/04/2014  . Chronic renal insufficiency, stage III (moderate) (HCC)   . Chronic shoulder pain 02/19/2014  . SOB (shortness of breath) 02/18/2014  . Acute on chronic diastolic heart failure ([place]) 12/24/2013  . Lumbago 12/18/2013  . History of CVA (cerebrovascular accident) 11/21/2013  . BPH associated with nocturia 11/21/2013  . Short-term memory loss 10/24/2013  . Chronic left shoulder pain 09/14/2013  . Venous insufficiency of both lower extremities 09/14/2013  . HTN (hypertension), benign 09/14/2013  . Normocytic anemia 06/19/2013    Past Surgical History:  Procedure Laterality Date  . CHOLECYSTECTOMY    . NM VQ LUNG SCAN ([place] HX)  09/2014   Low risk  . TONSILECTOMY, ADENOIDECTOMY, BILATERAL MYRINGOTOMY AND TUBES    . TRANSTHORACIC ECHOCARDIOGRAM  12/18/13   Grade 1 diast dysf, o/w normal       Home Medications    Prior to Admission medications   Medication Sig Start Date End Date Taking? Authorizing Provider  aspirin 81 MG chewable tablet Chew 81 mg  by mouth daily.   Yes [provider]  azelastine (OPTIVAR) 0.05 % ophthalmic solution Place 2 drops into both eyes every evening.   Yes [provider]  feeding supplement, ENSURE ENLIVE, (ENSURE ENLIVE) LIQD Take 237 mLs by mouth 2 (two) times daily between meals. 04/06/17  Yes Short, Noah Delaine, MD  finasteride (PROSCAR) 5 MG tablet TAKE ONE TABLET BY MOUTH EVERY DAY 01/25/17  Yes McGowen, [name], MD  levobunolol (BETAGAN) 0.5 % ophthalmic solution Place 1 drop into both eyes 2 (two) times daily. Patient  taking differently: Place 2 drops into both eyes daily.  06/21/16  Yes McGowen, [name], MD  Multiple Vitamins-Minerals (ICAPS AREDS 2 PO) Take 1 capsule by mouth 2 (two) times daily.   Yes [provider]  omeprazole (PRILOSEC) 40 MG capsule Take 1 capsule (40 mg total) by mouth daily. 03/21/17  Yes McGowen, [name], MD  ondansetron (ZOFRAN) 4 MG tablet Take 1 tablet (4 mg total) by mouth 4 (four) times daily -  before meals and at bedtime. 04/06/17  Yes Short, Noah Delaine, MD  polyethylene glycol powder (GLYCOLAX/MIRALAX) powder Take 17 g by mouth 2 (two) times daily as needed. 06/21/16  Yes McGowen, [name], MD    Family History Family History  Problem Relation Age of Onset  . Cancer Mother   . Heart disease Mother     Social History Social History  Substance Use Topics  . Smoking status: Former Smoker    Types: Pipe  . Smokeless tobacco: Never Used  . Alcohol use No     Allergies   Altace [ramipril]; Amoxicillin; Cozaar [losartan potassium]; and Tramadol   Review of Systems Review of Systems  Unable to perform ROS: Dementia     Physical Exam Updated Vital Signs BP (!) 141/62   Pulse 77   Temp 97.8 F (36.6 C) (Oral)   Resp 19   Ht 5\' 7"  (1.702 m)   Wt 60.8 kg (134 lb)   SpO2 100%   BMI 20.99 kg/m   Physical Exam  Constitutional: No distress.  Elderly, ABCs intact, no acute distress  HENT:  Head: Normocephalic and atraumatic.  Scabbing noted above the right eyebrow  Eyes: Pupils are equal, round, and reactive to light.  Pupils 2 mm reactive bilaterally  Neck: Neck supple.  Cardiovascular: Normal rate, regular rhythm and normal heart sounds.   No murmur heard. Pulmonary/Chest: Effort normal and breath sounds normal. No respiratory distress. He has no wheezes.  Abdominal: Soft. Bowel sounds are normal. There is no tenderness. There is no rebound.  Musculoskeletal: He exhibits no edema.  Normal passive range of motion of bilateral hips and knees, pain  with range of motion of the right knee, no obvious deformity, there is also pain on range of motion of the left shoulder, pulses intact  Neurological: He is alert.  Moves all 4 extremities, follows simple commands  Skin: Skin is warm and dry.  Psychiatric: He has a normal mood and affect.  Nursing note and vitals reviewed.    ED Treatments / Results  Labs (all labs ordered are listed, but only abnormal results are displayed) Labs Reviewed  CBC WITH DIFFERENTIAL/PLATELET - Abnormal; Notable for the following:       Result Value   RBC 3.84 (*)    Hemoglobin 12.1 (*)    HCT 35.1 (*)    Monocytes Absolute 1.1 (*)    All other components within normal limits  BASIC METABOLIC PANEL - Abnormal; Notable for  the following:    Glucose, Bld 141 (*)    Calcium 8.8 (*)    All other components within normal limits  URINALYSIS, ROUTINE W REFLEX MICROSCOPIC - Abnormal; Notable for the following:    Ketones, ur 5 (*)    Protein, ur 100 (*)    All other components within normal limits    EKG  EKG Interpretation  Date/Time:  Sunday April 15 2017 23:42:31 EDT Ventricular Rate:  74 PR Interval:    QRS Duration: 99 QT Interval:  441 QTC Calculation: 490 R Axis:   -29 Text Interpretation:  Sinus rhythm Abnormal R-wave progression, late transition Left ventricular hypertrophy Nonspecific T abnormalities, diffuse leads Borderline prolonged QT interval QT more prolonged and increased rate when compared to prior Confirmed by Thayer Jew [id number]) on 04/15/2017 11:47:37 PM       Radiology Ct Head Wo Contrast  Result Date: 04/16/2017 CLINICAL DATA:  Head trauma, headache. Unwitnessed fall. Increased confusion. EXAM: CT HEAD WITHOUT CONTRAST TECHNIQUE: Contiguous axial images were obtained from the base of the skull through the vertex without intravenous contrast. COMPARISON:  Head CT 04/04/2017, additional priors reviewed. FINDINGS: Brain: Advanced atrophy, stable from prior exam. Advanced  chronic small vessel ischemia, also unchanged. No acute hemorrhage, evidence of ischemia or subdural fluid collection. Stable ventricular prominence likely secondary to atrophy. No midline shift or mass effect. Vascular: Atherosclerosis of skullbase vasculature without hyperdense vessel or abnormal calcification. Skull: No fracture or focal lesion. Sinuses/Orbits: Paranasal sinuses and mastoid air cells are clear. The visualized orbits are unremarkable. Other: Mild parietal scalp contusion suspected, greater on the right. IMPRESSION: No acute intracranial abnormality. No skull fracture. Stable atrophy and advanced atrophy and chronic small vessel ischemia. Electronically Signed   By: Jeb Levering M.D.   On: 04/16/2017 00:32   Dg Shoulder Left  Result Date: 04/16/2017 CLINICAL DATA:  Shoulder pain EXAM: LEFT SHOULDER - 2+ VIEW COMPARISON:  02/12/2015 FINDINGS: AC joint degenerative change. High-riding humeral head as before. No fracture or dislocation. The left lung apex is clear IMPRESSION: 1. No acute osseous abnormality 2. High-riding humeral head, consistent with rotator cuff disease 3. AC joint degenerative change Electronically Signed   By: Donavan Foil M.D.   On: 04/16/2017 00:22   Dg Knee Complete 4 Views Right  Result Date: 04/16/2017 CLINICAL DATA:  Knee pain EXAM: RIGHT KNEE - COMPLETE 4+ VIEW COMPARISON:  None. FINDINGS: No fracture or malalignment. No large knee effusion. Mild patellofemoral degenerative changes. Mild degenerative changes of the medial and lateral compartment. Atherosclerotic calcification IMPRESSION: Mild degenerative changes.  No acute osseous abnormality Electronically Signed   By: Donavan Foil M.D.   On: 04/16/2017 00:23    Procedures Procedures (including critical care time)  Medications Ordered in ED Medications - No data to display   Initial Impression / Assessment and Plan / ED Course  I have reviewed the triage vital signs and the nursing  notes.  Pertinent labs & imaging results that were available during my care of the patient were reviewed by me and considered in my medical decision making (see chart for details).    Patient presents following a fall.  He reportedly lives by himself but very close to his son who found him.  He is unable to contribute to history taking.  No obvious injuries but multiple complaints on exam.  Workup is largely reassuring including head CT, x-rays, basic lab work.  I am concerned about the patient going home and living by himself.  Attempts to call the son were unsuccessful.  Message was left.  Based on chart review, recent physical therapy evaluation recommended SNF placement but the patient did not have a qualifying inpatient stay.  We will reconsult case management in the morning to help arrange for further care.  Final Clinical Impressions(s) / ED Diagnoses   Final diagnoses:  Fall, initial encounter  Dementia without behavioral disturbance, unspecified dementia type    New Prescriptions New Prescriptions   No medications on file     Merryl Hacker, MD 04/16/17 0209

## 2017-04-16 ENCOUNTER — Inpatient Hospital Stay (HOSPITAL_COMMUNITY): Payer: Medicare Other

## 2017-04-16 ENCOUNTER — Encounter (HOSPITAL_COMMUNITY): Payer: Self-pay | Admitting: Family Medicine

## 2017-04-16 DIAGNOSIS — G8194 Hemiplegia, unspecified affecting left nondominant side: Secondary | ICD-10-CM | POA: Diagnosis not present

## 2017-04-16 DIAGNOSIS — R296 Repeated falls: Secondary | ICD-10-CM | POA: Diagnosis present

## 2017-04-16 DIAGNOSIS — C44310 Basal cell carcinoma of skin of unspecified parts of face: Secondary | ICD-10-CM

## 2017-04-16 DIAGNOSIS — E785 Hyperlipidemia, unspecified: Secondary | ICD-10-CM | POA: Diagnosis present

## 2017-04-16 DIAGNOSIS — R351 Nocturia: Secondary | ICD-10-CM | POA: Diagnosis not present

## 2017-04-16 DIAGNOSIS — R29898 Other symptoms and signs involving the musculoskeletal system: Secondary | ICD-10-CM | POA: Diagnosis not present

## 2017-04-16 DIAGNOSIS — F0390 Unspecified dementia without behavioral disturbance: Secondary | ICD-10-CM | POA: Diagnosis present

## 2017-04-16 DIAGNOSIS — M25512 Pain in left shoulder: Principal | ICD-10-CM

## 2017-04-16 DIAGNOSIS — I639 Cerebral infarction, unspecified: Secondary | ICD-10-CM | POA: Diagnosis present

## 2017-04-16 DIAGNOSIS — Z9181 History of falling: Secondary | ICD-10-CM | POA: Diagnosis not present

## 2017-04-16 DIAGNOSIS — R42 Dizziness and giddiness: Secondary | ICD-10-CM | POA: Diagnosis not present

## 2017-04-16 DIAGNOSIS — R531 Weakness: Secondary | ICD-10-CM

## 2017-04-16 DIAGNOSIS — N3281 Overactive bladder: Secondary | ICD-10-CM | POA: Diagnosis not present

## 2017-04-16 DIAGNOSIS — I872 Venous insufficiency (chronic) (peripheral): Secondary | ICD-10-CM

## 2017-04-16 DIAGNOSIS — H409 Unspecified glaucoma: Secondary | ICD-10-CM | POA: Diagnosis present

## 2017-04-16 DIAGNOSIS — R748 Abnormal levels of other serum enzymes: Secondary | ICD-10-CM | POA: Diagnosis present

## 2017-04-16 DIAGNOSIS — I480 Paroxysmal atrial fibrillation: Secondary | ICD-10-CM | POA: Diagnosis present

## 2017-04-16 DIAGNOSIS — I130 Hypertensive heart and chronic kidney disease with heart failure and stage 1 through stage 4 chronic kidney disease, or unspecified chronic kidney disease: Secondary | ICD-10-CM | POA: Diagnosis present

## 2017-04-16 DIAGNOSIS — I252 Old myocardial infarction: Secondary | ICD-10-CM | POA: Diagnosis not present

## 2017-04-16 DIAGNOSIS — R64 Cachexia: Secondary | ICD-10-CM | POA: Diagnosis present

## 2017-04-16 DIAGNOSIS — M25561 Pain in right knee: Secondary | ICD-10-CM | POA: Diagnosis not present

## 2017-04-16 DIAGNOSIS — N401 Enlarged prostate with lower urinary tract symptoms: Secondary | ICD-10-CM

## 2017-04-16 DIAGNOSIS — E43 Unspecified severe protein-calorie malnutrition: Secondary | ICD-10-CM | POA: Diagnosis present

## 2017-04-16 DIAGNOSIS — R41841 Cognitive communication deficit: Secondary | ICD-10-CM | POA: Diagnosis not present

## 2017-04-16 DIAGNOSIS — Z85828 Personal history of other malignant neoplasm of skin: Secondary | ICD-10-CM | POA: Diagnosis not present

## 2017-04-16 DIAGNOSIS — S0990XA Unspecified injury of head, initial encounter: Secondary | ICD-10-CM | POA: Diagnosis not present

## 2017-04-16 DIAGNOSIS — N183 Chronic kidney disease, stage 3 (moderate): Secondary | ICD-10-CM

## 2017-04-16 DIAGNOSIS — G629 Polyneuropathy, unspecified: Secondary | ICD-10-CM | POA: Diagnosis not present

## 2017-04-16 DIAGNOSIS — M549 Dorsalgia, unspecified: Secondary | ICD-10-CM | POA: Diagnosis not present

## 2017-04-16 DIAGNOSIS — I6789 Other cerebrovascular disease: Secondary | ICD-10-CM | POA: Diagnosis not present

## 2017-04-16 DIAGNOSIS — G8929 Other chronic pain: Secondary | ICD-10-CM

## 2017-04-16 DIAGNOSIS — Z66 Do not resuscitate: Secondary | ICD-10-CM | POA: Diagnosis present

## 2017-04-16 DIAGNOSIS — Z6821 Body mass index (BMI) 21.0-21.9, adult: Secondary | ICD-10-CM | POA: Diagnosis not present

## 2017-04-16 DIAGNOSIS — R2681 Unsteadiness on feet: Secondary | ICD-10-CM | POA: Diagnosis not present

## 2017-04-16 DIAGNOSIS — I10 Essential (primary) hypertension: Secondary | ICD-10-CM

## 2017-04-16 DIAGNOSIS — R51 Headache: Secondary | ICD-10-CM | POA: Diagnosis not present

## 2017-04-16 DIAGNOSIS — J189 Pneumonia, unspecified organism: Secondary | ICD-10-CM | POA: Diagnosis not present

## 2017-04-16 DIAGNOSIS — Z7982 Long term (current) use of aspirin: Secondary | ICD-10-CM | POA: Diagnosis not present

## 2017-04-16 DIAGNOSIS — R279 Unspecified lack of coordination: Secondary | ICD-10-CM | POA: Diagnosis not present

## 2017-04-16 DIAGNOSIS — M6281 Muscle weakness (generalized): Secondary | ICD-10-CM | POA: Diagnosis not present

## 2017-04-16 DIAGNOSIS — Z79899 Other long term (current) drug therapy: Secondary | ICD-10-CM | POA: Diagnosis not present

## 2017-04-16 DIAGNOSIS — I5032 Chronic diastolic (congestive) heart failure: Secondary | ICD-10-CM | POA: Diagnosis present

## 2017-04-16 DIAGNOSIS — Z8673 Personal history of transient ischemic attack (TIA), and cerebral infarction without residual deficits: Secondary | ICD-10-CM | POA: Diagnosis not present

## 2017-04-16 DIAGNOSIS — I6523 Occlusion and stenosis of bilateral carotid arteries: Secondary | ICD-10-CM | POA: Diagnosis not present

## 2017-04-16 DIAGNOSIS — Z87891 Personal history of nicotine dependence: Secondary | ICD-10-CM | POA: Diagnosis not present

## 2017-04-16 DIAGNOSIS — M75112 Incomplete rotator cuff tear or rupture of left shoulder, not specified as traumatic: Secondary | ICD-10-CM | POA: Diagnosis not present

## 2017-04-16 DIAGNOSIS — Z7401 Bed confinement status: Secondary | ICD-10-CM | POA: Diagnosis not present

## 2017-04-16 DIAGNOSIS — F329 Major depressive disorder, single episode, unspecified: Secondary | ICD-10-CM | POA: Diagnosis not present

## 2017-04-16 LAB — HEMOGLOBIN A1C
Hgb A1c MFr Bld: 5.5 % (ref 4.8–5.6)
MEAN PLASMA GLUCOSE: 111.15 mg/dL

## 2017-04-16 LAB — CBC WITH DIFFERENTIAL/PLATELET
BASOS ABS: 0 10*3/uL (ref 0.0–0.1)
BASOS PCT: 0 %
EOS ABS: 0 10*3/uL (ref 0.0–0.7)
Eosinophils Relative: 0 %
HCT: 35.1 % — ABNORMAL LOW (ref 39.0–52.0)
Hemoglobin: 12.1 g/dL — ABNORMAL LOW (ref 13.0–17.0)
Lymphocytes Relative: 13 %
Lymphs Abs: 1.3 10*3/uL (ref 0.7–4.0)
MCH: 31.5 pg (ref 26.0–34.0)
MCHC: 34.5 g/dL (ref 30.0–36.0)
MCV: 91.4 fL (ref 78.0–100.0)
MONOS PCT: 11 %
Monocytes Absolute: 1.1 10*3/uL — ABNORMAL HIGH (ref 0.1–1.0)
NEUTROS ABS: 7.5 10*3/uL (ref 1.7–7.7)
NEUTROS PCT: 76 %
PLATELETS: 198 10*3/uL (ref 150–400)
RBC: 3.84 MIL/uL — AB (ref 4.22–5.81)
RDW: 13.3 % (ref 11.5–15.5)
WBC: 9.9 10*3/uL (ref 4.0–10.5)

## 2017-04-16 LAB — BASIC METABOLIC PANEL
ANION GAP: 7 (ref 5–15)
BUN: 11 mg/dL (ref 6–20)
CALCIUM: 8.8 mg/dL — AB (ref 8.9–10.3)
CO2: 28 mmol/L (ref 22–32)
Chloride: 105 mmol/L (ref 101–111)
Creatinine, Ser: 0.83 mg/dL (ref 0.61–1.24)
GFR calc Af Amer: >60 mL/min (ref >60)
Glucose, Bld: 141 mg/dL — ABNORMAL HIGH (ref 65–99)
Potassium: 3.6 mmol/L (ref 3.5–5.1)
SODIUM: 140 mmol/L (ref 135–145)

## 2017-04-16 LAB — URINALYSIS, ROUTINE W REFLEX MICROSCOPIC
Bacteria, UA: NONE SEEN
Bilirubin Urine: NEGATIVE
GLUCOSE, UA: NEGATIVE mg/dL
Hgb urine dipstick: NEGATIVE
KETONES UR: 5 mg/dL — AB
LEUKOCYTES UA: NEGATIVE
NITRITE: NEGATIVE
PH: 6 (ref 5.0–8.0)
Protein, ur: 100 mg/dL — AB
Specific Gravity, Urine: 1.014 (ref 1.005–1.030)
Squamous Epithelial / LPF: NONE SEEN

## 2017-04-16 LAB — TROPONIN I
TROPONIN I: 0.22 ng/mL — AB (ref <0.03)
TROPONIN I: 0.18 ng/mL — AB (ref <0.03)

## 2017-04-16 LAB — CK: Total CK: 246 U/L (ref 49–397)

## 2017-04-16 MED ORDER — ACETAMINOPHEN 160 MG/5ML PO SOLN: 650.0000 mg | ORAL | Status: DC | PRN

## 2017-04-16 MED ORDER — ASPIRIN 81 MG PO CHEW
81.0000 mg | CHEWABLE_TABLET | Freq: Every day | ORAL | Status: DC
  Administered 2017-04-16: 81 mg via ORAL
  Filled 2017-04-16: qty 1

## 2017-04-16 MED ORDER — ACETAMINOPHEN 650 MG RE SUPP: 650.0000 mg | RECTAL | Status: DC | PRN

## 2017-04-16 MED ORDER — ENOXAPARIN SODIUM 40 MG/0.4ML ~~LOC~~ SOLN
40.0000 mg | SUBCUTANEOUS | Status: DC
  Administered 2017-04-16 – 2017-04-19 (×4): 40 mg via SUBCUTANEOUS
  Filled 2017-04-16 (×4): qty 0.4

## 2017-04-16 MED ORDER — ONDANSETRON HCL 4 MG PO TABS
4.0000 mg | ORAL_TABLET | Freq: Three times a day (TID) | ORAL | Status: DC
  Administered 2017-04-16 – 2017-04-19 (×11): 4 mg via ORAL
  Filled 2017-04-16 (×11): qty 1

## 2017-04-16 MED ORDER — SENNOSIDES-DOCUSATE SODIUM 8.6-50 MG PO TABS
1.0000 | ORAL_TABLET | Freq: Every day | ORAL | Status: DC
  Administered 2017-04-16 – 2017-04-18 (×2): 1 via ORAL
  Filled 2017-04-16 (×2): qty 1

## 2017-04-16 MED ORDER — ACETAMINOPHEN 325 MG PO TABS
650.0000 mg | ORAL_TABLET | ORAL | Status: DC | PRN
  Administered 2017-04-17 – 2017-04-18 (×3): 650 mg via ORAL
  Filled 2017-04-16 (×4): qty 2

## 2017-04-16 MED ORDER — KETOTIFEN FUMARATE 0.025 % OP SOLN
1.0000 [drp] | Freq: Two times a day (BID) | OPHTHALMIC | Status: DC
  Administered 2017-04-16 – 2017-04-19 (×6): 1 [drp] via OPHTHALMIC
  Filled 2017-04-16: qty 5

## 2017-04-16 MED ORDER — ASPIRIN 300 MG RE SUPP: 300.0000 mg | Freq: Every day | RECTAL | Status: DC

## 2017-04-16 MED ORDER — STROKE: EARLY STAGES OF RECOVERY BOOK
Freq: Once | Status: DC
  Filled 2017-04-16: qty 1

## 2017-04-16 MED ORDER — SODIUM CHLORIDE 0.9 % IV SOLN
INTRAVENOUS | Status: AC
  Administered 2017-04-16 – 2017-04-18 (×4): via INTRAVENOUS

## 2017-04-16 MED ORDER — ASPIRIN 325 MG PO TABS
325.0000 mg | ORAL_TABLET | Freq: Every day | ORAL | Status: DC
  Administered 2017-04-17 – 2017-04-19 (×3): 325 mg via ORAL
  Filled 2017-04-16 (×3): qty 1

## 2017-04-16 MED ORDER — HYDRALAZINE HCL 20 MG/ML IJ SOLN: 10.0000 mg | INTRAMUSCULAR | Status: DC | PRN

## 2017-04-16 MED ORDER — POLYETHYLENE GLYCOL 3350 17 G PO PACK
17.0000 g | PACK | Freq: Two times a day (BID) | ORAL | Status: DC | PRN
Start: 2017-04-16 — End: 2017-04-19

## 2017-04-16 MED ORDER — ENSURE ENLIVE PO LIQD
237.0000 mL | Freq: Two times a day (BID) | ORAL | Status: DC
  Administered 2017-04-17: 237 mL via ORAL

## 2017-04-16 MED ORDER — PANTOPRAZOLE SODIUM 40 MG PO TBEC
40.0000 mg | DELAYED_RELEASE_TABLET | Freq: Every day | ORAL | Status: DC
  Administered 2017-04-16 – 2017-04-19 (×4): 40 mg via ORAL
  Filled 2017-04-16 (×4): qty 1

## 2017-04-16 MED ORDER — LEVOBUNOLOL HCL 0.5 % OP SOLN
1.0000 [drp] | Freq: Two times a day (BID) | OPHTHALMIC | Status: DC
  Administered 2017-04-16 – 2017-04-19 (×6): 1 [drp] via OPHTHALMIC
  Filled 2017-04-16 (×2): qty 5

## 2017-04-16 MED ORDER — FINASTERIDE 5 MG PO TABS
5.0000 mg | ORAL_TABLET | Freq: Every day | ORAL | Status: DC
  Administered 2017-04-16 – 2017-04-19 (×4): 5 mg via ORAL
  Filled 2017-04-16 (×7): qty 1

## 2017-04-16 MED ORDER — KETOTIFEN FUMARATE 0.025 % OP SOLN
2.0000 [drp] | Freq: Two times a day (BID) | OPHTHALMIC | Status: DC
  Filled 2017-04-16: qty 5

## 2017-04-16 NOTE — Care Management (Signed)
Placed call to Chris-patient's son- left voicemail. Per notes during previous admission, patient is active with [facility] and pays OOP for an aide. Son lives next door. This CM had lengthy conversation with patient's son last admission about options for placement. Patient did not qualify (necessary 3 night stay as inpatient) for SNF. Discussed patient applying for Medicaid and going to nursing home for long term care, son is not agreeable to that. Updated Tiffany, ER charge nurse.

## 2017-04-16 NOTE — ED Notes (Signed)
Case mgt informed of pt status change.

## 2017-04-16 NOTE — Care Management (Addendum)
Received return voicemail from Chris-patient's son- he does report that patient is active with [facility] for RN, OT, PT. Receiving services 3 times per week and still have the aide that they pay OOP for once a week. Patient has now been admitted to hospital. Placed call to [facility] again, no option to leave voicemail.

## 2017-04-16 NOTE — Evaluation (Signed)
Speech Language Pathology Evaluation Patient Details Name: Lance Kemp MRN: [id number] DOB: Mar 04, 1923 Today's Date: 04/16/2017 Time: 6237-6283 SLP Time Calculation (min) (ACUTE ONLY): 28 min  Problem List:  Patient Active Problem List   Diagnosis Date Noted  . Acute CVA (cerebrovascular accident) ([place]) 04/16/2017  . Falls frequently 04/16/2017  . Generalized weakness 04/05/2017  . Vertigo 02/04/2017  . Basal cell carcinoma, face 04/07/2015  . Moderate dementia 11/24/2014  . OAB (overactive bladder) 10/17/2014  . Essential hypertension 08/25/2014  . Hyperlipidemia 08/25/2014  . Peripheral edema 04/16/2014  . Chronic diastolic heart failure ([place]) 03/04/2014  . Chronic renal insufficiency, stage III (moderate) (HCC)   . Chronic shoulder pain 02/19/2014  . SOB (shortness of breath) 02/18/2014  . Lumbago 12/18/2013  . Acute left hemiparesis ([place]) 11/21/2013  . BPH associated with nocturia 11/21/2013  . Short-term memory loss 10/24/2013  . Chronic left shoulder pain 09/14/2013  . Venous insufficiency of both lower extremities 09/14/2013  . HTN (hypertension), benign 09/14/2013  . Normocytic anemia 06/19/2013   Past Medical History:  Past Medical History:  Diagnosis Date  . Basal cell carcinoma 2011; 2016   Face  . BPH with obstruction/lower urinary tract symptoms   . Cervical spondylosis    with miltilevel changes leading to narrowing or compression of spinal nerve foramina.  +spinal stenosis.  Mild spondylolisthesis L7-T1.  Marland Kitchen Chronic back pain   . Chronic diastolic heart failure ([place])   . Chronic left shoulder pain 09/14/2013  . Chronic renal insufficiency, stage III (moderate) (HCC) 2013   CrCl in the 50s-60s  . Chronic venous insufficiency    Compression hose  . 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  No significant residual deficit except short term memory impairment  . Dementia 2016   Dr. Delice Lesch 08/2014--started aricept but pt did not tolerate.  Exelon  trial started by Dr. Delice Lesch 05/2016.  Marland Kitchen Depression   . Frequent headaches   . Glaucoma   . Hypertension   . Normocytic anemia 2015   Secondary to CRI (iron studies ok 12/2013)  . NSTEMI (non-ST elevated myocardial infarction) ([place]) 06/2011   [place]: Troponin mildly elevated, normal EKG, normal ECHO, pt declined cardiology consult--per old PCP records.  Marland Kitchen OAB (overactive bladder)   . Osteoarthritis   . Peripheral neuropathy 2013   Dx'd by neuro in [place] at the time the patient was hospitalized briefly for pontine CVA.  Marland Kitchen Urine incontinence   . Vertigo    Past Surgical History:  Past Surgical History:  Procedure Laterality Date  . CHOLECYSTECTOMY    . NM VQ LUNG SCAN ([place] HX)  09/2014   Low risk  . TONSILECTOMY, ADENOIDECTOMY, BILATERAL MYRINGOTOMY AND TUBES    . TRANSTHORACIC ECHOCARDIOGRAM  12/18/13   Grade 1 diast dysf, o/w normal   HPI:  Lance Kemp is a 83 y.o. male who lives home alone with an aide that checks on him and his son that lives nearby has had multiple recent hospitalizations for various situations dealing with poor self-care at home. He presents today with another fall. He falls frequently. He has dementia and chronic generalized weakness. He has cerebrovascular disease and history of stroke and TIA. Apparently the patient's son found him down on the ground. The patient is unable to give history regarding events that led up to the fall. He reported having pain all over his body. He is only oriented to himself. He has a past medical history detailed below. ED workup was essentially negative. However, ED staff  noticed that the patient was not lifting his left arm or left leg. He does have chronic left shoulder pain from a chronic rotator cuff injury. There was concern that he has had an acute CVA. We are unable to determine at this time what his baseline is. He is being admitted for possible acute CVA. MRI is negative for acute change.   Assessment / Plan /  Recommendation Clinical Impression  SLE ordered as part of stroke protocol. No record of RN stroke swallow screen found in chart. Pt placed on regular textures and PT voiced concerns regarding swallowing thin liquds during evaluation. SLP saw Pt during late lunch meal. Oral motor evaluation reveals mild generalized oral weakness with structures and decreased vocal intensity. Pt reports familial history of reduced vocal volume (mother and sister) and reports that people have asked him to repeat himself for "years". MRI negative for acute changes. Pt observed to cough when drinking soda from a can. No coughing noted via cup or straw presentations with coffee and water. Pt presents with mild cognitive impairment with deficits in working and short term memory. No family is present to clarify Pt's baseline cogntive linguistic status. SLP will attempt to speak with family tomorrow.     SLP Assessment  SLP Recommendation/Assessment: All further Speech Lanaguage Pathology  needs can be addressed in the next venue of care (pending baseline) SLP Visit Diagnosis: Cognitive communication deficit (R41.841)    Follow Up Recommendations       Frequency and Duration           SLP Evaluation Cognition  Overall Cognitive Status: History of cognitive impairments - at baseline Arousal/Alertness: Awake/alert Orientation Level: Oriented to person;Other (comment);Disoriented to place;Disoriented to situation Memory: Impaired Memory Impairment: Storage deficit;Retrieval deficit Awareness: Appears intact Problem Solving: Appears intact Safety/Judgment: Appears intact       Comprehension  Auditory Comprehension Overall Auditory Comprehension: Appears within functional limits for tasks assessed Yes/No Questions: Within Functional Limits Commands: Within Functional Limits Conversation: Simple Interfering Components: Working Field seismologist: Repetition Doctor, hospital: Within Function Limits Reading Comprehension Reading Status: Not tested    Expression Expression Primary Mode of Expression: Verbal Verbal Expression Overall Verbal Expression: Appears within functional limits for tasks assessed Initiation: No impairment Automatic Speech: Name;Social Response;Counting Level of Generative/Spontaneous Verbalization: Conversation Repetition: No impairment Naming: No impairment Pragmatics: No impairment Non-Verbal Means of Communication: Not applicable Written Expression Dominant Hand: Right Written Expression: Not tested   Oral / Motor  Oral Motor/Sensory Function Overall Oral Motor/Sensory Function: Generalized oral weakness Motor Speech Overall Motor Speech: Impaired Respiration: Impaired Level of Impairment: Conversation Phonation: Low vocal intensity Resonance: Within functional limits Articulation: Within functional limitis Intelligibility: Intelligibility reduced Word: 75-100% accurate Phrase: 75-100% accurate Sentence: 75-100% accurate Conversation: 75-100% accurate Motor Planning: Witnin functional limits Motor Speech Errors: Not applicable Interfering Components: Premorbid status Effective Techniques: Increased vocal intensity   Thank you,  [name], [place]                     Lotta Frankenfield 04/16/2017, 4:41 PM

## 2017-04-16 NOTE — ED Notes (Signed)
Alert and oriented to person and place only.

## 2017-04-16 NOTE — Evaluation (Signed)
Physical Therapy Evaluation Patient Details Name: Lance Kemp MRN: [id number] DOB: 1922-08-06 Today's Date: 04/16/2017   History of Present Illness  Lance Kemp is a 83 y.o. male who lives home alone with an aide that checks on him and his son that lives nearby has had multiple recent hospitalizations for various situations dealing with poor self-care at home.  He presents today with another fall.  He falls frequently.  He has dementia and chronic generalized weakness.  He has cerebrovascular disease and history of stroke and TIA.  Apparently the patient's son found him down on the ground.  The patient is unable to give history regarding events that led up to the fall.  He reported having pain all over his body.  He is only oriented to himself.  He has a past medical history detailed below.  ED workup was essentially negative.  However, ED staff noticed that the patient was not lifting his left arm or left leg.  He does have chronic left shoulder pain from a chronic rotator cuff injury.  There was concern that he has had an acute CVA.  We are unable to determine at this time what his baseline is.  He is being admitted for possible acute CVA.    Clinical Impression  Pt recently discharged from hospital with recommendation of SNF.  Pt ended up going home and has fallen.  He is currently weaker than previous admission with significantly decreased mobility but this might be due to mm soreness From the fall.  At this time Mr. Knoth will benefit from a short term rehab placement to improve his strength and  Functional mobility.     Follow Up Recommendations SNF    Equipment Recommendations  None recommended by PT    Recommendations for Other Services   OT    Precautions / Restrictions Precautions Precautions: Fall Restrictions Weight Bearing Restrictions: No          Pertinent Vitals/Pain Pain Assessment: 0-10 Pain Score: 8  Pain Location: shoulder and back  Pain Descriptors /  Indicators: Throbbing;Aching Pain Intervention(s): Limited activity within patient's tolerance    Home Living Family/patient expects to be discharged to:: Skilled nursing facility Living Arrangements: Alone Available Help at Discharge: Family (lives across the street from patient) Type of Home: House Home Access: Ramped entrance     Home Layout: One level Home Equipment: Environmental consultant - 2 wheels;Shower seat - built in Additional Comments: Has caregiver that helps with showers 1x/week    Prior Function Level of Independence: Needs assistance   Gait / Transfers Assistance Needed: household distances with RW d/t balance deficits, aide assists with bathing.   ADL's / Homemaking Assistance Needed: assisted by family and home aide        Hand Dominance   Dominant Hand: Right    Extremity/Trunk Assessment   Upper Extremity Assessment Upper Extremity Assessment: Defer to OT evaluation    Lower Extremity Assessment Lower Extremity Assessment: LLE deficits/detail;RLE deficits/detail RLE Deficits / Details: Rt hip mm 3-/5; knee 3+/5; ankle 4/5  LLE Deficits / Details: Pt Lt hip mm generally 2+/5 LT knee mm 3/5 and ankle 4/5        Communication   Communication: No difficulties  Cognition Arousal/Alertness: Awake/alert Behavior During Therapy: WFL for tasks assessed/performed Overall Cognitive Status: History of cognitive impairments - at baseline  General Comments      Exercises General Exercises - Lower Extremity Ankle Circles/Pumps: AROM;Both;10 reps Heel Slides: Both;5 reps Hip ABduction/ADduction: Both;AAROM;PROM (Painful due to DDD) Straight Leg Raises: Both;5 reps (Unable to complete full SLR)   Assessment/Plan    PT Assessment Patient needs continued PT services  PT Problem List Decreased strength;Decreased range of motion;Decreased activity tolerance;Decreased mobility;Pain       PT Treatment Interventions  Gait training;Therapeutic activities;Therapeutic exercise;Balance training;Functional mobility training;Patient/family education    PT Goals (Current goals can be found in the Care Plan section)  Acute Rehab PT Goals Patient Stated Goal: To get stronger  PT Goal Formulation: With patient Time For Goal Achievement: 04/18/17 Potential to Achieve Goals: Good    Frequency Min 5X/week   Barriers to discharge        Co-evaluation               AM-PAC PT "6 Clicks" Daily Activity  Outcome Measure                  End of Session   Activity Tolerance: Patient tolerated treatment well Patient left: in bed;with call bell/phone within reach   PT Visit Diagnosis: Repeated falls (R29.6);Muscle weakness (generalized) (M62.81);History of falling (Z91.81);Difficulty in walking, not elsewhere classified (R26.2)    Time: 0102-7253 PT Time Calculation (min) (ACUTE ONLY): 29 min   Charges:   PT Evaluation $PT Eval Low Complexity: 1 Low     PT G CodesRayetta Humphrey, PT CLT [phone] 04/16/2017, 3:44 PM

## 2017-04-16 NOTE — Care Management (Signed)
Received call from patient's son- made him aware that patient has been admitted.

## 2017-04-16 NOTE — ED Notes (Addendum)
Attempted to call pt son, [name], went straight to voicemail with message saying to leave message for him or his Dad Lance Kemp, message left to call [facility] to discuss his Dad's workup and disposition.

## 2017-04-16 NOTE — Progress Notes (Signed)
CRITICAL VALUE ALERT  Critical Value:  Troponin 0.22  Date & Time Notied:  04/16/2017 1255  Provider Notified: Dr. Murvin Natal  Orders Received/Actions taken:  No new orders.  Continue to monitor.

## 2017-04-16 NOTE — Progress Notes (Signed)
04/16/2017 1:27 PM  Update MRI brain: no acute CVA seen.   Murvin Natal MD

## 2017-04-16 NOTE — Progress Notes (Signed)
04/16/2017 1:10 PM  Elevated troponin noted, likely from recent traumatic fall, no acute EKG changes, will follow.  Repeat EKG ordered.   Murvin Natal MD

## 2017-04-16 NOTE — ED Notes (Signed)
Per Lidia Collum with CM- awaiting return call from pt's son.

## 2017-04-16 NOTE — ED Provider Notes (Addendum)
Patient was seen by Dr. Dina Rich last evening.  He was discharged from the hospital on the 19th for issues with weakness.  Patient had an unwitnessed fall.  Patient was unable to stand and walk.  ED workup is negative.  During his last hospitalization they recommended skilled nursing facility placement.  Patient denies any specific complaints this morning however he is not able to lift his left arm or left leg off the bed.  Pt states this started after his fall.    On my exam, he cannot lift his left arm or left leg.  He is able to wash his face using his right hand.  Concerned he may have had a stroke.  Exact time unknown.  VAN negative.  Outside TPA window.  Will consult medical service for admission, consider MRI, further workup  Vitals:   04/16/17 0400 04/16/17 0632  BP: 110/70 (!) 134/51  Pulse: 68 (!) 58  Resp: 17 17  Temp:    SpO2: 96% 100%         Dorie Rank, MD 04/16/17 [phone]

## 2017-04-16 NOTE — ED Notes (Signed)
Pt is awake.  Pt not able to lift left arm off bed.  Dr Tomi Bamberger in to assess pt.  Called Tonia Brooms., RN case manager and left message.

## 2017-04-16 NOTE — H&P (Signed)
History and Physical  Lance Kemp ZLD:[id number] DOB: 10-11-1922 DOA: 04/15/2017  Referring physician: Tomi Bamberger PCP: [name], MD   Chief Complaint: Fall   HPI: Lance Kemp is a 83 y.o. male who lives home alone with an aide that checks on him and his son that lives nearby has had multiple recent hospitalizations for various situations dealing with poor self-care at home.  He presents today with another fall.  He falls frequently.  He has dementia and chronic generalized weakness.  He has cerebrovascular disease and history of stroke and TIA.  Apparently the patient's son found him down on the ground.  The patient is unable to give history regarding events that led up to the fall.  He reported having pain all over his body.  He is only oriented to himself.  He has a past medical history detailed below.  ED workup was essentially negative.  However, ED staff noticed that the patient was not lifting his left arm or left leg.  He does have chronic left shoulder pain from a chronic rotator cuff injury.  There was concern that he has had an acute CVA.  We are unable to determine at this time what his baseline is.  He is being admitted for possible acute CVA.  He will receive further workup with an MRI study.  CT of the brain did not show acute findings.  He was given aspirin.  He was out of the TPA window per ED physician because his fall was unwitnessed and we are not able to determine when symptoms started.  We have made multiple attempts to contact patient's son but his phone goes to voicemail only.  Review of Systems: Unable to obtain secondary to dementia  Past Medical History:  Diagnosis Date  . Basal cell carcinoma 2011; 2016   Face  . BPH with obstruction/lower urinary tract symptoms   . Cervical spondylosis    with miltilevel changes leading to narrowing or compression of spinal nerve foramina.  +spinal stenosis.  Mild spondylolisthesis L7-T1.  Marland Kitchen Chronic back pain   . Chronic  diastolic heart failure ([place])   . Chronic left shoulder pain 09/14/2013  . Chronic renal insufficiency, stage III (moderate) (HCC) 2013   CrCl in the 50s-60s  . Chronic venous insufficiency    Compression hose  . 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  No significant residual deficit except short term memory impairment  . Dementia 2016   Dr. Delice Lesch 08/2014--started aricept but pt did not tolerate.  Exelon trial started by Dr. Delice Lesch 05/2016.  Marland Kitchen Depression   . Frequent headaches   . Glaucoma   . Hypertension   . Normocytic anemia 2015   Secondary to CRI (iron studies ok 12/2013)  . NSTEMI (non-ST elevated myocardial infarction) ([place]) 06/2011   [place]: Troponin mildly elevated, normal EKG, normal ECHO, pt declined cardiology consult--per old PCP records.  Marland Kitchen OAB (overactive bladder)   . Osteoarthritis   . Peripheral neuropathy 2013   Dx'd by neuro in [place] at the time the patient was hospitalized briefly for pontine CVA.  Marland Kitchen Urine incontinence   . Vertigo    Past Surgical History:  Procedure Laterality Date  . CHOLECYSTECTOMY    . NM VQ LUNG SCAN ([place] [facility])  09/2014   Low risk  . TONSILECTOMY, ADENOIDECTOMY, BILATERAL MYRINGOTOMY AND TUBES    . TRANSTHORACIC ECHOCARDIOGRAM  12/18/13   Grade 1 diast dysf, o/w normal   Social History:  reports that he has  quit smoking. His smoking use included Pipe. He has never used smokeless tobacco. He reports that he does not drink alcohol or use drugs.  Allergies  Allergen Reactions  . Altace [Ramipril] Other (See Comments)    Reaction:  Unknown   . Amoxicillin Other (See Comments)    Reaction:  Unknown  Has patient had a PCN reaction causing immediate rash, facial/tongue/throat swelling, SOB or lightheadedness with hypotension: Unsure Has patient had a PCN reaction causing severe rash involving mucus membranes or skin necrosis: Unsure Has patient had a PCN reaction that required hospitalization Unsure Has patient had a PCN reaction  occurring within the last 10 years: Unsure If all of the above answers are "NO", then may proceed with Cephalosporin use.  [name] Potassium] Other (See Comments)    Reaction:  Unknown   . Tramadol Other (See Comments)    Makes patient feel very drowsy/almost too strong to take    Family History  Problem Relation Age of Onset  . Cancer Mother   . Heart disease Mother     Prior to Admission medications   Medication Sig Start Date End Date Taking? Authorizing Provider  aspirin 81 MG chewable tablet Chew 81 mg by mouth daily.   Yes [provider]  azelastine (OPTIVAR) 0.05 % ophthalmic solution Place 2 drops into both eyes every evening.   Yes [provider]  feeding supplement, ENSURE ENLIVE, (ENSURE ENLIVE) LIQD Take 237 mLs by mouth 2 (two) times daily between meals. 04/06/17  Yes Short, Noah Delaine, MD  finasteride (PROSCAR) 5 MG tablet TAKE ONE TABLET BY MOUTH EVERY DAY 01/25/17  Yes McGowen, [name], MD  levobunolol (BETAGAN) 0.5 % ophthalmic solution Place 1 drop into both eyes 2 (two) times daily. Patient taking differently: Place 2 drops into both eyes daily.  06/21/16  Yes McGowen, [name], MD  Multiple Vitamins-Minerals (ICAPS AREDS 2 PO) Take 1 capsule by mouth 2 (two) times daily.   Yes [provider]  omeprazole (PRILOSEC) 40 MG capsule Take 1 capsule (40 mg total) by mouth daily. 03/21/17  Yes McGowen, [name], MD  ondansetron (ZOFRAN) 4 MG tablet Take 1 tablet (4 mg total) by mouth 4 (four) times daily -  before meals and at bedtime. 04/06/17  Yes Short, Noah Delaine, MD  polyethylene glycol powder (GLYCOLAX/MIRALAX) powder Take 17 g by mouth 2 (two) times daily as needed. 06/21/16  Yes [name], MD   Physical Exam: Vitals:   04/16/17 0220 04/16/17 0400 04/16/17 0632 04/16/17 0800  BP: 117/62 110/70 (!) 134/51 113/68  Pulse: 79 68 (!) 58 66  Resp: 18 17 17 16   Temp:    98.1 F (36.7 C)  TempSrc:    Oral  SpO2: 100% 96% 100%  100%  Weight:      Height:         General exam: emaciated elderly male, appears chronically ill, lying comfortably supine on the gurney in no obvious distress. Oriented to person and place only.   Head, eyes and ENT: large basal cell cancer over right eyebrow. Pupils equally reacting to light and accommodation. Oral mucosa dry.  Neck: Supple. No JVD, carotid bruit or thyromegaly.  Lymphatics: No lymphadenopathy.  Respiratory system: Clear to auscultation. No increased work of breathing.  Cardiovascular system: S1 and S2 heard normal.  No JVD, murmurs, gallops, clicks or pedal edema.  Gastrointestinal system: Abdomen is nondistended, soft and nontender. Normal bowel sounds heard. No organomegaly or masses appreciated.  Central nervous  system: mild left hemiparesis, however, may be due to pain.   Extremities:  Peripheral pulses symmetrically felt.   Skin: No rashes or acute findings.  Musculoskeletal system: diffuse tenderness noted and bruising from recent falls.   Psychiatry: Pleasant and cooperative.  Labs on Admission:  Basic Metabolic Panel:  Recent Labs Lab 04/15/17 2354  NA 140  K 3.6  CL 105  CO2 28  GLUCOSE 141*  BUN 11  CREATININE 0.83  CALCIUM 8.8*   Liver Function Tests: No results for input(s): AST, ALT, ALKPHOS, BILITOT, PROT, ALBUMIN in the last 168 hours. No results for input(s): LIPASE, AMYLASE in the last 168 hours. No results for input(s): AMMONIA in the last 168 hours. CBC:  Recent Labs Lab 04/15/17 2354  WBC 9.9  NEUTROABS 7.5  HGB 12.1*  HCT 35.1*  MCV 91.4  PLT 198   Cardiac Enzymes: No results for input(s): CKTOTAL, CKMB, CKMBINDEX, TROPONINI in the last 168 hours.  BNP (last 3 results) No results for input(s): PROBNP in the last 8760 hours. CBG: No results for input(s): GLUCAP in the last 168 hours.  Radiological Exams on Admission: Ct Head Wo Contrast  Result Date: 04/16/2017 CLINICAL DATA:  Head trauma, headache.  Unwitnessed fall. Increased confusion. EXAM: CT HEAD WITHOUT CONTRAST TECHNIQUE: Contiguous axial images were obtained from the base of the skull through the vertex without intravenous contrast. COMPARISON:  Head CT 04/04/2017, additional priors reviewed. FINDINGS: Brain: Advanced atrophy, stable from prior exam. Advanced chronic small vessel ischemia, also unchanged. No acute hemorrhage, evidence of ischemia or subdural fluid collection. Stable ventricular prominence likely secondary to atrophy. No midline shift or mass effect. Vascular: Atherosclerosis of skullbase vasculature without hyperdense vessel or abnormal calcification. Skull: No fracture or focal lesion. Sinuses/Orbits: Paranasal sinuses and mastoid air cells are clear. The visualized orbits are unremarkable. Other: Mild parietal scalp contusion suspected, greater on the right. IMPRESSION: No acute intracranial abnormality. No skull fracture. Stable atrophy and advanced atrophy and chronic small vessel ischemia. Electronically Signed   By: Jeb Levering M.D.   On: 04/16/2017 00:32   Dg Shoulder Left  Result Date: 04/16/2017 CLINICAL DATA:  Shoulder pain EXAM: LEFT SHOULDER - 2+ VIEW COMPARISON:  02/12/2015 FINDINGS: AC joint degenerative change. High-riding humeral head as before. No fracture or dislocation. The left lung apex is clear IMPRESSION: 1. No acute osseous abnormality 2. High-riding humeral head, consistent with rotator cuff disease 3. AC joint degenerative change Electronically Signed   By: Donavan Foil M.D.   On: 04/16/2017 00:22   Dg Knee Complete 4 Views Right  Result Date: 04/16/2017 CLINICAL DATA:  Knee pain EXAM: RIGHT KNEE - COMPLETE 4+ VIEW COMPARISON:  None. FINDINGS: No fracture or malalignment. No large knee effusion. Mild patellofemoral degenerative changes. Mild degenerative changes of the medial and lateral compartment. Atherosclerotic calcification IMPRESSION: Mild degenerative changes.  No acute osseous  abnormality Electronically Signed   By: Donavan Foil M.D.   On: 04/16/2017 00:23   EKG: Independently reviewed. Sinus rhythm seen.   Assessment/Plan Principal Problem:   Acute CVA (cerebrovascular accident) ([place]) Active Problems:   History of CVA (cerebrovascular accident)   Mild dementia   Chronic left shoulder pain   Venous insufficiency of both lower extremities   HTN (hypertension), benign   BPH associated with nocturia   Chronic renal insufficiency, stage III (moderate) (HCC)   Chronic diastolic heart failure (HCC)   Hyperlipidemia   Basal cell carcinoma, face   Vertigo   Generalized weakness  Falls frequently  1. Question of Acute CVA -the patient does have a mild left hemiparesis however upon further examination a lot of his symptoms are related to pain and his chronic left shoulder injury as a possible cause of why he is hesitant to move the left upper extremity.  He does move the left lower extremity however it is different from the right.  He will be admitted for further evaluation.  MRI brain ordered.  MRI study ordered.  Neurochecks were ordered.  Continue aspirin for anticoagulation.  Allow permissive hypertension.  Neurology consult requested.  The patient is out of the TPA window.  PT OT evaluation recommended.  I strongly feel that he is no longer safe to remain at home alone and will need skilled nursing care.  I have consulted a Education officer, museum to assist with this. 2. Mild to moderate dementia-he is followed by a neurologist for this outpatient.  Resume home medications. 3. Frequently falls-I have requested PT evaluation.  I strongly suspect he will need skilled nursing placement. 4. Basal cell carcinoma face 5. Generalized weakness-multifactorial given advanced age and comorbidities, PT evaluation requested, social worker consult requested.  Dietitian consult requested. 6. Chronic kidney disease stage III-we will give gentle fluid hydration and follow  BMP. 7. Hypertension- essential, follow blood pressures but allow permissive hypertension in the setting of possible acute CVA. 8. Chronic diastolic heart failure-well compensated and stable.  Follow clinically. 9. DNR - will continue order from multiple prior admissions.  Unable to get in touch with son, left multiple voicemail messages and did not receive return phone call.    DVT Prophylaxis: lovenox Code Status: DNR  Family Communication: left message with son Disposition Plan: TBD   Time spent: 15 mins  Irwin Brakeman, MD [facility] Pager [phone]  If 7PM-7AM, please contact night-coverage [email or web address] Password [email or web address] 04/16/2017, 9:04 AM

## 2017-04-17 ENCOUNTER — Inpatient Hospital Stay (HOSPITAL_COMMUNITY): Payer: Medicare Other

## 2017-04-17 DIAGNOSIS — I6789 Other cerebrovascular disease: Secondary | ICD-10-CM

## 2017-04-17 DIAGNOSIS — G8194 Hemiplegia, unspecified affecting left nondominant side: Secondary | ICD-10-CM

## 2017-04-17 LAB — LIPID PANEL
CHOL/HDL RATIO: 3.6 ratio
Cholesterol: 130 mg/dL (ref 0–200)
HDL: 36 mg/dL — AB (ref >40)
LDL CALC: 74 mg/dL (ref 0–99)
TRIGLYCERIDES: 102 mg/dL (ref <150)
VLDL: 20 mg/dL (ref 0–40)

## 2017-04-17 LAB — COMPREHENSIVE METABOLIC PANEL WITH GFR
ALT: 19 U/L (ref 17–63)
AST: 25 U/L (ref 15–41)
Albumin: 2.5 g/dL — ABNORMAL LOW (ref 3.5–5.0)
Alkaline Phosphatase: 71 U/L (ref 38–126)
Anion gap: 5 (ref 5–15)
BUN: 17 mg/dL (ref 6–20)
CO2: 25 mmol/L (ref 22–32)
Calcium: 8.1 mg/dL — ABNORMAL LOW (ref 8.9–10.3)
Chloride: 106 mmol/L (ref 101–111)
Creatinine, Ser: 1.06 mg/dL (ref 0.61–1.24)
GFR calc Af Amer: >60 mL/min
GFR calc non Af Amer: 59 mL/min — ABNORMAL LOW
Glucose, Bld: 116 mg/dL — ABNORMAL HIGH (ref 65–99)
Potassium: 3.7 mmol/L (ref 3.5–5.1)
Sodium: 136 mmol/L (ref 135–145)
Total Bilirubin: 1 mg/dL (ref 0.3–1.2)
Total Protein: 5 g/dL — ABNORMAL LOW (ref 6.5–8.1)

## 2017-04-17 LAB — CBC
HCT: 31.3 % — ABNORMAL LOW (ref 39.0–52.0)
Hemoglobin: 10.6 g/dL — ABNORMAL LOW (ref 13.0–17.0)
MCH: 31.4 pg (ref 26.0–34.0)
MCHC: 33.9 g/dL (ref 30.0–36.0)
MCV: 92.6 fL (ref 78.0–100.0)
PLATELETS: 150 10*3/uL (ref 150–400)
RBC: 3.38 MIL/uL — AB (ref 4.22–5.81)
RDW: 13.7 % (ref 11.5–15.5)
WBC: 6.3 10*3/uL (ref 4.0–10.5)

## 2017-04-17 LAB — ECHOCARDIOGRAM COMPLETE
Height: 67 in
Weight: 2161.6 oz

## 2017-04-17 LAB — CK: CK TOTAL: 77 U/L (ref 49–397)

## 2017-04-17 MED ORDER — ENSURE ENLIVE PO LIQD
237.0000 mL | Freq: Three times a day (TID) | ORAL | Status: DC
  Administered 2017-04-17 – 2017-04-19 (×7): 237 mL via ORAL

## 2017-04-17 NOTE — Evaluation (Signed)
Occupational Therapy Evaluation Patient Details Name: Lance Kemp MRN: [id number] DOB: 06-Nov-1922 Today's Date: 04/17/2017    History of Present Illness Lance Kemp is a 83 y.o. male who lives home alone with an aide that checks on him and his son that lives nearby has had multiple recent hospitalizations for various situations dealing with poor self-care at home.  He presents today with another fall.  He falls frequently.  He has dementia and chronic generalized weakness.  He has cerebrovascular disease and history of stroke and TIA.  Apparently the patient's son found him down on the ground.  The patient is unable to give history regarding events that led up to the fall.  He reported having pain all over his body.  He is only oriented to himself.  He has a past medical history detailed below.  ED workup was essentially negative.  However, ED staff noticed that the patient was not lifting his left arm or left leg.  He does have chronic left shoulder pain from a chronic rotator cuff injury.  MRI negative for acute infarct.    Clinical Impression   Pt received supine in bed, marginally agreeable to OT evaluation. Pt complaining of pain "all over" and is hesitant to perform mobility. No family available to provide PLOF.  Pt requiring increased assistance for bed mobility however sit to stand transfer was fairly good. During stand pivot transfer from bed to chair pt stating "I'm going down" and proceeded to bend his knees as if falling. OT and nursing cuing pt to stand, providing max A +2 to finish transfer to chair. Pt is currently requiring significant level of assistance for ADLs and mobility, recommend SNF on discharge for safety and to improve strength and independence in ADL completion.     Follow Up Recommendations  SNF    Equipment Recommendations  None recommended by OT       Precautions / Restrictions Precautions Precautions: Fall Restrictions Weight Bearing Restrictions: No       Mobility Bed Mobility Overal bed mobility: Needs Assistance Bed Mobility: Supine to Sit Rolling: Max assist;+2 for physical assistance            Transfers Overall transfer level: Needs assistance Equipment used: Rolling walker (2 wheeled) Transfers: Sit to/from [facility] Sit to Stand: Min assist; +2 physical assistance Stand pivot transfers: Max assist;+2 physical assistance                ADL either performed or assessed with clinical judgement   ADL Overall ADL's : Needs assistance/impaired                                       General ADL Comments: Pt is requiring assistance with all ADL completion at this time, unable to assess specific ADLs due to pt guarding and complaints of being cold     Vision Baseline Vision/History: Wears glasses Wears Glasses: Reading only Patient Visual Report: No change from baseline Vision Assessment?: No apparent visual deficits            Pertinent Vitals/Pain Pain Assessment: Faces Faces Pain Scale: Hurts even more Pain Location: "all over" Pain Descriptors / Indicators: Grimacing;Guarding;Moaning Pain Intervention(s): Limited activity within patient's tolerance;Monitored during session;Repositioned     Hand Dominance Right   Extremity/Trunk Assessment Upper Extremity Assessment Upper Extremity Assessment: Generalized weakness LUE Deficits / Details: chronic shoulder pain   Lower Extremity Assessment  Lower Extremity Assessment: Defer to PT evaluation       Communication Communication Communication: No difficulties   Cognition Arousal/Alertness: Awake/alert Behavior During Therapy: WFL for tasks assessed/performed Overall Cognitive Status: History of cognitive impairments - at baseline                                                Home Living   Living Arrangements: Alone Available Help at Discharge: Family;Available PRN/intermittently Type of Home:  House Home Access: Ramped entrance     Home Layout: One level               Home Equipment: Walker - 2 wheels;Shower seat - built in      Lives With: Alone (son and DIL across street. )    Prior Functioning/Environment Level of Independence: Needs assistance  Gait / Transfers Assistance Needed: per pt used RW for ambulation ADL's / Homemaking Assistance Needed: per pt family and aide assist, unable to determine level of assistance            OT Problem List: Decreased strength;Decreased range of motion;Decreased activity tolerance;Impaired balance (sitting and/or standing);Decreased cognition;Decreased safety awareness;Decreased knowledge of use of DME or AE;Impaired UE functional use;Pain      OT Treatment/Interventions: Self-care/ADL training;Therapeutic exercise;DME and/or AE instruction;Therapeutic activities;Patient/family education    OT Goals(Current goals can be found in the care plan section) Acute Rehab OT Goals Patient Stated Goal: None stated OT Goal Formulation: Patient unable to participate in goal setting Time For Goal Achievement: 05/01/17 Potential to Achieve Goals: Good  OT Frequency: Min 2X/week   Barriers to D/C: Decreased caregiver support  Pt lives alone and son works             End of Glass blower/designer During Treatment: Orthoptist  Activity Tolerance: Patient limited by pain Patient left: in chair;with call bell/phone within reach;with nursing/sitter in room  OT Visit Diagnosis: Muscle weakness (generalized) (M62.81);Other symptoms and signs involving cognitive function;Pain Pain - Right/Left:  ("all over") Pain - part of body:  ("all over")                Time: 8032-1224 OT Time Calculation (min): 33 min Charges:  OT General Charges $OT Visit: 1 Visit OT Evaluation $OT Eval Moderate Complexity: [place], OTR/L  [phone] 04/17/2017, 9:42 AM

## 2017-04-17 NOTE — Progress Notes (Signed)
PROGRESS NOTE   Lance Kemp  OIN:[id number]  DOB: 03-Dec-1922  DOA: 04/15/2017 PCP: [name], MD  Brief Admission Hx: Lance Kemp is a 83 y.o. male who lives home alone with an aide that checks on him and his son that lives nearby has had multiple recent hospitalizations for various situations dealing with poor self-care at home.  He presents today with another fall.  He falls frequently.  He has dementia and chronic generalized weakness.  He has cerebrovascular disease and history of stroke and TIA.  Apparently the patient's son found him down on the ground.  There was concern for acute CVA as he seemed to have a left hemiparesis.   MDM/Assessment & Plan:   1. Question of Acute CVA -He did not have a CVA.  It was initially thought that he had a mild left hemiparesis however upon further examination a lot of his symptoms are related to pain and his chronic left shoulder injury as a possible cause of why he is hesitant to move the left upper extremity.  He does move the left lower extremity in a pain limited manor. DC neuro checks.   Continue aspirin for secondary prevention.    2. Mild to moderate dementia-he is followed by a neurologist for this outpatient.  Resume home medications. 3. Frequently falls-I have requested PT evaluation.  I strongly suspect he will need skilled nursing placement. PT/OT evaluated patient and they also agree that he needs SNF.  He is much weaker now than he was last admission.  4. Basal cell carcinoma face - follow up outpatient.  5. Generalized weakness-multifactorial given advanced age and comorbidities, PT evaluation requested, social worker consult requested.  Dietitian consult requested. 6. Paroxysmal Afib - Diagnosed by his PCP Dr. Anitra Lauth in the office, he felt that patient was not safe to be anticoagulated because of his frequent falls and dementia and I agree.  Pt's rate is controlled.   7. Chronic kidney disease stage III-we will give gentle  fluid hydration and follow BMP. 8. Elevated troponin - trending down, likely secondary to recent falls, no chest pain symptoms.  9. Hypertension- essential, better controlled now. 10. Chronic diastolic heart failure-well compensated and stable.  Follow clinically. 11. DNR - will continue order from multiple prior admissions.      DVT Prophylaxis: lovenox Code Status: DNR  Family Communication: left message with son Disposition Plan: SNF placement in process   Subjective: Pt says he has appetite to eat this morning.    Objective: Vitals:   04/16/17 1625 04/16/17 1700 04/16/17 2114 04/17/17 0430  BP: 130/69 (!) 133/50 (!) 141/55 (!) 122/59  Pulse: 73 66 65 68  Resp:  18 18 18   Temp:  98.7 F (37.1 C) 98.2 F (36.8 C) 97.9 F (36.6 C)  TempSrc:  Oral Oral Axillary  SpO2: 97% 99% 97% 98%  Weight:      Height:        Intake/Output Summary (Last 24 hours) at 04/17/17 1034 Last data filed at 04/17/17 0700  Gross per 24 hour  Intake          1165.83 ml  Output                0 ml  Net          1165.83 ml   Filed Weights   04/15/17 2010 04/16/17 1111  Weight: 60.8 kg (134 lb) 61.3 kg (135 lb 1.6 oz)     REVIEW OF SYSTEMS  As  per history otherwise all reviewed and reported negative  Exam:  General exam: emaciated elderly male, large basal cell cancer over right eyebrow. Cooperative. Oriented x 2.  Respiratory system: Clear. No increased work of breathing. Cardiovascular system: normal S1 & S2 heard.  Gastrointestinal system: Abdomen is nondistended, soft and nontender. Normal bowel sounds heard. Central nervous system: Alert and cooperative. Moving all extremities.  Extremities: severe left shoulder pain and deconditioning, no clubbing or cyanosis.  Data Reviewed: Basic Metabolic Panel:  Recent Labs Lab 04/15/17 2354  NA 140  K 3.6  CL 105  CO2 28  GLUCOSE 141*  BUN 11  CREATININE 0.83  CALCIUM 8.8*   Liver Function Tests: No results for input(s): AST,  ALT, ALKPHOS, BILITOT, PROT, ALBUMIN in the last 168 hours. No results for input(s): LIPASE, AMYLASE in the last 168 hours. No results for input(s): AMMONIA in the last 168 hours. CBC:  Recent Labs Lab 04/15/17 2354  WBC 9.9  NEUTROABS 7.5  HGB 12.1*  HCT 35.1*  MCV 91.4  PLT 198   Cardiac Enzymes:  Recent Labs Lab 04/16/17 1134 04/16/17 1208 04/16/17 1801  CKTOTAL  --  246  --   TROPONINI 0.22*  --  0.18*   CBG (last 3)  No results for input(s): GLUCAP in the last 72 hours. No results found for this or any previous visit (from the past 240 hour(s)).   Studies: Ct Head Wo Contrast  Result Date: 04/16/2017 CLINICAL DATA:  Head trauma, headache. Unwitnessed fall. Increased confusion. EXAM: CT HEAD WITHOUT CONTRAST TECHNIQUE: Contiguous axial images were obtained from the base of the skull through the vertex without intravenous contrast. COMPARISON:  Head CT 04/04/2017, additional priors reviewed. FINDINGS: Brain: Advanced atrophy, stable from prior exam. Advanced chronic small vessel ischemia, also unchanged. No acute hemorrhage, evidence of ischemia or subdural fluid collection. Stable ventricular prominence likely secondary to atrophy. No midline shift or mass effect. Vascular: Atherosclerosis of skullbase vasculature without hyperdense vessel or abnormal calcification. Skull: No fracture or focal lesion. Sinuses/Orbits: Paranasal sinuses and mastoid air cells are clear. The visualized orbits are unremarkable. Other: Mild parietal scalp contusion suspected, greater on the right. IMPRESSION: No acute intracranial abnormality. No skull fracture. Stable atrophy and advanced atrophy and chronic small vessel ischemia. Electronically Signed   By: Jeb Levering M.D.   On: 04/16/2017 00:32   Mr Brain Wo Contrast  Result Date: 04/16/2017 CLINICAL DATA:  [place] yesterday.  Worsened confusion. EXAM: MRI HEAD WITHOUT CONTRAST MRA HEAD WITHOUT CONTRAST TECHNIQUE: Multiplanar, multiecho  pulse sequences of the brain and surrounding structures were obtained without intravenous contrast. Angiographic images of the head were obtained using MRA technique without contrast. COMPARISON:  CT 04/16/2017.  MRI 07/27/2015. FINDINGS: MRI HEAD FINDINGS Brain: Diffusion imaging does not show any acute or subacute infarction. There chronic small-vessel ischemic changes throughout the pons. No focal cerebellar insult. Cerebral hemispheres show generalized atrophy with confluent chronic small vessel ischemic changes throughout the white matter. Ventricles are prominent, presumed secondary to central atrophy, unchanged since the previous study. Normal pressure hydrocephalus not excluded but not favored. No mass lesion, hemorrhage, hydrocephalus or extra-axial collection. Vascular: Major vessels at the base of the brain show flow. Skull and upper cervical spine: Negative Sinuses/Orbits: Clear/normal Other: None MRA HEAD FINDINGS Both internal carotid arteries are widely patent through the skullbase and siphon regions. The anterior and middle cerebral vessels are patent without proximal stenosis, aneurysm or vascular malformation. Aplastic or chronically occluded A1 segment on the right with  both anterior cerebral arteries receiving there supply from the left carotid circulation. Diminutive temporal branch of the MCA on the right. Both vertebral arteries are patent to the basilar showing antegrade flow. No basilar stenosis. Posterior circulation branch vessels show flow. IMPRESSION: No change since the previous examination. Atrophy and chronic small-vessel ischemic changes. No acute or traumatic finding. Intracranial MR angiography of the large and medium size vessels is negative. Electronically Signed   By: Nelson Chimes M.D.   On: 04/16/2017 13:23   US Carotid Bilateral (at [facility] Only)  Result Date: 04/16/2017 CLINICAL DATA:  CVA.  Dementia.  Altered mental status. EXAM: BILATERAL CAROTID DUPLEX ULTRASOUND  TECHNIQUE: Pearline Cables scale imaging, color Doppler and duplex ultrasound were performed of bilateral carotid and vertebral arteries in the neck. COMPARISON:  None. FINDINGS: Criteria: Quantification of carotid stenosis is based on velocity parameters that correlate the residual internal carotid diameter with NASCET-based stenosis levels, using the diameter of the distal internal carotid lumen as the denominator for stenosis measurement. The following velocity measurements were obtained: RIGHT ICA:  72 cm/sec CCA:  86 cm/sec SYSTOLIC ICA/CCA RATIO:  0.8 DIASTOLIC ICA/CCA RATIO:  1.5 ECA:  77 cm/sec LEFT ICA:  104 cm/sec CCA:  865 cm/sec SYSTOLIC ICA/CCA RATIO:  1.0 DIASTOLIC ICA/CCA RATIO:  1.7 ECA:  216 cm/sec RIGHT CAROTID ARTERY: Minimal smooth soft plaque in the bulb. Low resistance internal carotid Doppler pattern. Mild calcified plaque in the lower internal carotid. RIGHT VERTEBRAL ARTERY:  Antegrade. LEFT CAROTID ARTERY: There is focal mixed plaque in the mid common carotid artery. There is also moderate irregular mixed plaque in the bulb. Low resistance internal carotid Doppler pattern is preserved. LEFT VERTEBRAL ARTERY:  Antegrade. IMPRESSION: Less than 50% stenosis in the right and left internal carotid arteries. Electronically Signed   By: Marybelle Killings M.D.   On: 04/16/2017 13:41   Dg Shoulder Left  Result Date: 04/16/2017 CLINICAL DATA:  Shoulder pain EXAM: LEFT SHOULDER - 2+ VIEW COMPARISON:  02/12/2015 FINDINGS: AC joint degenerative change. High-riding humeral head as before. No fracture or dislocation. The left lung apex is clear IMPRESSION: 1. No acute osseous abnormality 2. High-riding humeral head, consistent with rotator cuff disease 3. AC joint degenerative change Electronically Signed   By: Donavan Foil M.D.   On: 04/16/2017 00:22   Dg Knee Complete 4 Views Right  Result Date: 04/16/2017 CLINICAL DATA:  Knee pain EXAM: RIGHT KNEE - COMPLETE 4+ VIEW COMPARISON:  None. FINDINGS: No fracture  or malalignment. No large knee effusion. Mild patellofemoral degenerative changes. Mild degenerative changes of the medial and lateral compartment. Atherosclerotic calcification IMPRESSION: Mild degenerative changes.  No acute osseous abnormality Electronically Signed   By: Donavan Foil M.D.   On: 04/16/2017 00:23   Mr Jodene Nam Head/brain HQ Cm  Result Date: 04/16/2017 CLINICAL DATA:  [place] yesterday.  Worsened confusion. EXAM: MRI HEAD WITHOUT CONTRAST MRA HEAD WITHOUT CONTRAST TECHNIQUE: Multiplanar, multiecho pulse sequences of the brain and surrounding structures were obtained without intravenous contrast. Angiographic images of the head were obtained using MRA technique without contrast. COMPARISON:  CT 04/16/2017.  MRI 07/27/2015. FINDINGS: MRI HEAD FINDINGS Brain: Diffusion imaging does not show any acute or subacute infarction. There chronic small-vessel ischemic changes throughout the pons. No focal cerebellar insult. Cerebral hemispheres show generalized atrophy with confluent chronic small vessel ischemic changes throughout the white matter. Ventricles are prominent, presumed secondary to central atrophy, unchanged since the previous study. Normal pressure hydrocephalus not excluded but not favored. No  mass lesion, hemorrhage, hydrocephalus or extra-axial collection. Vascular: Major vessels at the base of the brain show flow. Skull and upper cervical spine: Negative Sinuses/Orbits: Clear/normal Other: None MRA HEAD FINDINGS Both internal carotid arteries are widely patent through the skullbase and siphon regions. The anterior and middle cerebral vessels are patent without proximal stenosis, aneurysm or vascular malformation. Aplastic or chronically occluded A1 segment on the right with both anterior cerebral arteries receiving there supply from the left carotid circulation. Diminutive temporal branch of the MCA on the right. Both vertebral arteries are patent to the basilar showing antegrade flow. No  basilar stenosis. Posterior circulation branch vessels show flow. IMPRESSION: No change since the previous examination. Atrophy and chronic small-vessel ischemic changes. No acute or traumatic finding. Intracranial MR angiography of the large and medium size vessels is negative. Electronically Signed   By: Nelson Chimes M.D.   On: 04/16/2017 13:23   Scheduled Meds: .  stroke: mapping our early stages of recovery book   Does not apply Once  . aspirin  300 mg Rectal Daily   Or  . aspirin  325 mg Oral Daily  . enoxaparin (LOVENOX) injection  40 mg Subcutaneous Q24H  . feeding supplement (ENSURE ENLIVE)  237 mL Oral BID BM  . finasteride  5 mg Oral Daily  . ketotifen  1 drop Both Eyes BID  . levobunolol  1 drop Both Eyes BID  . ondansetron  4 mg Oral TID AC & HS  . pantoprazole  40 mg Oral Daily  . senna-docusate  1 tablet Oral QHS   Continuous Infusions: . sodium chloride 50 mL/hr at 04/17/17 0601    Principal Problem:   Acute left hemiparesis [facility]) Active Problems:   Moderate dementia   Acute CVA (cerebrovascular accident) ([place])   Chronic left shoulder pain   Venous insufficiency of both lower extremities   HTN (hypertension), benign   BPH associated with nocturia   Chronic renal insufficiency, stage III (moderate) (HCC)   Chronic diastolic heart failure (HCC)   Hyperlipidemia   Basal cell carcinoma, face   Vertigo   Generalized weakness   Falls frequently  Time spent:   Irwin Brakeman, MD, FAAFP [facility] Pager [phone]  If 7PM-7AM, please contact night-coverage [email or web address] Password [email or web address] 04/17/2017, 10:34 AM    LOS: 1 day

## 2017-04-17 NOTE — Clinical Social Work Note (Signed)
Clinical Social Work Assessment  Patient Details  Name: Lance Kemp MRN: [id number] Date of Birth: [birth year]  Date of referral:  04/17/17               Reason for consult:  Facility Placement                Permission sought to share information with:    Permission granted to share information::     Name::        Agency::     Relationship::     Contact Information:  Pt's son Gerald Stabs   Housing/Transportation Living arrangements for the past 2 months:  Single Family Home Source of Information:  Other (Comment Required) (chart) Patient Interpreter Needed:  None Criminal Activity/Legal Involvement Pertinent to Current Situation/Hospitalization:    Significant Relationships:  Adult Children Lives with:  Self Do you feel safe going back to the place where you live?  Yes Need for family participation in patient care:  Yes (Comment)  Care giving concerns: Pt needs assistance with care.   Social Worker assessment / plan: Pt is a 83 year old male admitted with acute left hemiparesis. Reviewed pt's record today and discussed pt's status with care giving team. Pt is only oriented to person at this time. Attempted to contact pt's son to further assess pt's status. Had to leave a VMM requesting R/C. From the record, pt was living alone prior to admission. He has assistance from a caregiving aide and his son lives across the street. Pt has reportedly been struggling at home and he has dementia. PT is recommending SNF at dc. CSW will further assist with SNF referrals once pt's son returns the call and provides choices.   Employment status:  Retired Forensic scientist:  Medicare PT Recommendations:  [place] / Referral to community resources:     Patient/Family's Response to care: To be determined.  Patient/Family's Understanding of and Emotional Response to Diagnosis, Current Treatment, and Prognosis: To be determined.  Emotional Assessment Appearance:   Appears stated age Attitude/Demeanor/Rapport:    Affect (typically observed):  Calm Orientation:  Oriented to Self Alcohol / Substance use:    Psych involvement (Current and /or in the community):     Discharge Needs  Concerns to be addressed:  Discharge Planning Concerns Readmission within the last 30 days:  Yes Current discharge risk:  Lives alone Barriers to Discharge:  No Barriers Identified   Shade Flood, LCSW 04/17/2017, 12:11 PM

## 2017-04-17 NOTE — Progress Notes (Signed)
Physical Therapy Treatment Patient Details Name: Lance Kemp MRN: [id number] DOB: 17-Oct-1922 Today's Date: 04/17/2017    History of Present Illness Lance Kemp is a 83 y.o. male who lives home alone with an aide that checks on him and his son that lives nearby has had multiple recent hospitalizations for various situations dealing with poor self-care at home.  He presents today with another fall.  He falls frequently.  He has dementia and chronic generalized weakness.  He has cerebrovascular disease and history of stroke and TIA.  Apparently the patient's son found him down on the ground.  The patient is unable to give history regarding events that led up to the fall.  He reported having pain all over his body.  He is only oriented to himself.  He has a past medical history detailed below.  ED workup was essentially negative.  However, ED staff noticed that the patient was not lifting his left arm or left leg.  He does have chronic left shoulder pain from a chronic rotator cuff injury.  MRI negative for acute infarct.     PT Comments    Patient presents up In chair (assisted earlier by OT staff) demonstrates poor tolerance/balance for standing due to BLE weakness and c/o fatigue/feeling cold.   Patient will benefit from continued physical therapy in hospital and recommended venue below to increase strength, balance, endurance for safe ADLs and gait.   Follow Up Recommendations  SNF     Equipment Recommendations  None recommended by PT    Recommendations for Other Services       Precautions / Restrictions Precautions Precautions: Fall Restrictions Weight Bearing Restrictions: No    Mobility  Bed Mobility Overal bed mobility: Needs Assistance Bed Mobility: Supine to Sit;Sit to Supine;Rolling Rolling: Mod assist   Supine to sit: Mod assist Sit to supine: Mod assist      Transfers Overall transfer level: Needs assistance Equipment used: Rolling walker (2  wheeled) Transfers: Sit to/from [facility] Sit to Stand: Mod assist Stand pivot transfers: Max assist       General transfer comment: unable to transfer using RW due to poor standing balance, required stand pivot with knees blocked  Ambulation/Gait Ambulation/Gait assistance: Max assist Ambulation Distance (Feet): 1 Feet Assistive device: Rolling walker (2 wheeled) Gait Pattern/deviations: Decreased stance time - right;Decreased stance time - left;Decreased step length - right;Decreased step length - left;Decreased stride length   Gait velocity interpretation: Below normal speed for age/gender General Gait Details: Patient limited to 1-2 steps during attempt to transfer using RW, then had to sit due to BLE weakness/poor standing balance   Stairs            Wheelchair Mobility    Modified Rankin (Stroke Patients Only)       Balance Overall balance assessment: Needs assistance Sitting-balance support: Feet supported;Bilateral upper extremity supported Sitting balance-Leahy Scale: Fair     Standing balance support: Bilateral upper extremity supported;During functional activity Standing balance-Leahy Scale: Poor                              Cognition Arousal/Alertness: Awake/alert Behavior During Therapy: WFL for tasks assessed/performed Overall Cognitive Status: History of cognitive impairments - at baseline  Exercises General Exercises - Lower Extremity Ankle Circles/Pumps: Seated;AROM;Strengthening;Both;10 reps Long Arc Quad: Seated;AROM;Strengthening;Both;5 reps Hip Flexion/Marching: Seated;AROM;Strengthening;Both;5 reps    General Comments        Pertinent Vitals/Pain Pain Assessment: No/denies pain    Home Living                      Prior Function            PT Goals (current goals can now be found in the care plan section) Acute Rehab PT Goals Patient  Stated Goal: return home PT Goal Formulation: With patient Time For Goal Achievement: 04/24/17 Potential to Achieve Goals: Good Progress towards PT goals: Progressing toward goals    Frequency    Min 5X/week      PT Plan Current plan remains appropriate    Co-evaluation              AM-PAC PT "6 Clicks" Daily Activity  Outcome Measure  Difficulty turning over in bed (including adjusting bedclothes, sheets and blankets)?: Unable Difficulty moving from lying on back to sitting on the side of the bed? : Unable Difficulty sitting down on and standing up from a chair with arms (e.g., wheelchair, bedside commode, etc,.)?: Unable Help needed moving to and from a bed to chair (including a wheelchair)?: A Lot Help needed walking in hospital room?: A Lot Help needed climbing 3-5 steps with a railing? : Total 6 Click Score: 8    End of Session   Activity Tolerance: Patient limited by fatigue Patient left: in bed;with call bell/phone within reach;with bed alarm set Nurse Communication: Mobility status PT Visit Diagnosis: Unsteadiness on feet (R26.81);Other abnormalities of gait and mobility (R26.89);Muscle weakness (generalized) (M62.81)     Time: 6606-0045 PT Time Calculation (min) (ACUTE ONLY): 21 min  Charges:  $Therapeutic Activity: 8-22 mins                    G Codes:       1:21 PM, 10-May-2017 Lonell Grandchild, MPT Physical Therapist with [facility] [phone] office [phone] mobile phone

## 2017-04-17 NOTE — Progress Notes (Signed)
Initial Nutrition Assessment  DOCUMENTATION CODES:  Not applicable  INTERVENTION:  Ensure Enlive po TID, each supplement provides 350 kcal and 20 grams of protein  Magic cup TID with meals, each supplement provides 290 kcal and 9 grams of protein   Given patients history and perceived poor cognition, agree that patient is unable to maintain adequate nutrition in current living situation.   NUTRITION DIAGNOSIS:  Inadequate oral intake related to social / environmental circumstances, chronic illness (Dementia, deconditioning, inability to take care of self at home) as evidenced by loss of 7% bw x 1.5-2 months  GOAL:  Patient will meet greater than or equal to 90% of their needs  MONITOR:  PO intake, Supplement acceptance, Labs, I & O's  REASON FOR ASSESSMENT:  Consult Assessment of nutrition requirement/status  ASSESSMENT:  83 y/o male PMHx MI, CVA, CKD, HF, HTN, Depression, Dementia, Chronic back pain, frequent falls, multiple admission r/t poor self care at home. Presented after son found pt on ground. Had mild L hemiparesis on examination and admitted due to concern for CVA. Additionally, patient is strongly not felt safe to remain at home alone and disposition planned as SNF.    Patient seen in bed. Noted untouched L tray at bedside. He is not interested in eating. He just wants to sleep. RD asked how he was doing nutritionally at home, but information was limited. He does not remember how many times he would eat a day. He does say he drank Ensure at home, approximately 2-3/day. He has a poor appetite and constipation.   He does not know his UBW. RD noted that in the chart, he has had a progressive decline in weight. He was 165 lbs in 2015, 155 lbs in 2016-2017, 140-145 lbs for the majority of 2018 and now he is 135 lbs. When he is told this he says his UBW is 155 lbs. He was ~140-145 lbs in August-September. This is a loss of ~7% bw x1.5-2 months  RD noted he has had poor PO intake  since arrival, <25% of 1 meal and only fluids at another. RD asked what he would like to eat. He says "it doesn't matter". He was agreeable to supplements. Will order.   Physical exam shows mild-moderate orbital/temporal wasting. No discernible wasting of Lower extremities or upper body, however exam was difficult due to positioning.   Labs: Glu: 141, A1c 5.5,  Meds:PPI, Senokot S, Zofran, IVF  NUTRITION - FOCUSED PHYSICAL EXAM:   Most Recent Value  Orbital Region  Mild depletion  Upper Arm Region  No depletion  Thoracic and Lumbar Region  No depletion  Buccal Region  No depletion  Temple Region  Mild depletion  Clavicle Bone Region  No depletion  Clavicle and Acromion Bone Region  No depletion  Scapular Bone Region  No depletion  Dorsal Hand  No depletion  Patellar Region  No depletion  Anterior Thigh Region  No depletion  Posterior Calf Region  No depletion       Diet Order:  Diet Heart Room service appropriate? Yes; Fluid consistency: Thin  EDUCATION NEEDS:  No education needs have been identified at this time  Skin: Generalized abrasions to arms  Last BM:  10/29  Height:  Ht Readings from Last 1 Encounters:  04/16/17 5\' 7"  (1.702 m)   Weight:  Wt Readings from Last 1 Encounters:  04/16/17 135 lb 1.6 oz (61.3 kg)   Wt Readings from Last 10 Encounters:  04/16/17 135 lb 1.6 oz (61.3 kg)  04/06/17 134 lb 14.4 oz (61.2 kg)  03/21/17 139 lb (63 kg)  03/19/17 145 lb (65.8 kg)  02/22/17 145 lb 4 oz (65.9 kg)  02/11/17 142 lb 3.2 oz (64.5 kg)  02/04/17 143 lb (64.9 kg)  02/01/17 144 lb 12 oz (65.7 kg)  11/01/16 146 lb 8 oz (66.5 kg)  08/03/16 147 lb 8 oz (66.9 kg)   Ideal Body Weight:  67.27 kg  BMI:  Body mass index is 21.16 kg/m.  Estimated Nutritional Needs:  Kcal:  1700-1950 kcals (28-32 kcal/kg bw_ Protein:  74-86g Pro (1.2-1.4 g/kg bw) Fluid:  >1.5 L fluid (25 ml/kg bw)  Burtis Junes RD, LDN, CNSC Clinical Nutrition Pager: [phone] 04/17/2017 1:03  PM

## 2017-04-17 NOTE — Progress Notes (Signed)
  Progress Note   Date: 04/17/2017  Patient Name: Lance Kemp        MRN#: [id number]  Review of the patient's clinical findings supports the diagnosis of  Severe protein calorie malnutrition   Irwin Brakeman, MD

## 2017-04-17 NOTE — NC FL2 (Signed)
[place] LEVEL OF CARE SCREENING TOOL     IDENTIFICATION  Patient Name: Lance Kemp Birthdate: 02/25/1923 Sex: male Admission Date (Current Location): 04/15/2017  [facility] and [place] Number:  [employer] and Address:  [place] [address], [place]      Provider Number: [id number]  Attending Physician Name and Address:  Murlean Iba, MD  Relative Name and Phone Number:       Current Level of Care: Hospital Recommended Level of Care: [place] Prior Approval Number:    Date Approved/Denied:   PASRR Number: [id number]  Discharge Plan: SNF    Current Diagnoses: Patient Active Problem List   Diagnosis Date Noted  . Acute CVA (cerebrovascular accident) ([place]) 04/16/2017  . Falls frequently 04/16/2017  . Generalized weakness 04/05/2017  . Vertigo 02/04/2017  . Basal cell carcinoma, face 04/07/2015  . Moderate dementia 11/24/2014  . OAB (overactive bladder) 10/17/2014  . Essential hypertension 08/25/2014  . Hyperlipidemia 08/25/2014  . Peripheral edema 04/16/2014  . Chronic diastolic heart failure ([place]) 03/04/2014  . Chronic renal insufficiency, stage III (moderate) (HCC)   . Chronic shoulder pain 02/19/2014  . SOB (shortness of breath) 02/18/2014  . Lumbago 12/18/2013  . Acute left hemiparesis ([place]) 11/21/2013  . BPH associated with nocturia 11/21/2013  . Short-term memory loss 10/24/2013  . Chronic left shoulder pain 09/14/2013  . Venous insufficiency of both lower extremities 09/14/2013  . HTN (hypertension), benign 09/14/2013  . Normocytic anemia 06/19/2013    Orientation RESPIRATION BLADDER Height & Weight     Self    Incontinent Weight: 135 lb 1.6 oz (61.3 kg) Height:  5\' 7"  (170.2 cm)  BEHAVIORAL SYMPTOMS/MOOD NEUROLOGICAL BOWEL NUTRITION STATUS      Continent Diet (heart healthy)  AMBULATORY STATUS COMMUNICATION OF NEEDS Skin   Extensive Assist Verbally Normal                      Personal Care Assistance Level of Assistance  Bathing, Feeding, Dressing Bathing Assistance: Limited assistance Feeding assistance: Independent Dressing Assistance: Limited assistance     Functional Limitations Info             SPECIAL CARE FACTORS FREQUENCY                       Contractures      Additional Factors Info  Code Status, Allergies Code Status Info: DNR Allergies Info: Altace, Amoxicillin, Cozaar, Tramadol           Current Medications (04/17/2017):  This is the current hospital active medication list Current Facility-Administered Medications  Medication Dose Route Frequency Provider Last Rate Last Dose  .  stroke: mapping our early stages of recovery book   Does not apply Once Johnson, Clanford L, MD      . 0.9 %  sodium chloride infusion   Intravenous Continuous Wynetta Emery, Clanford L, MD 50 mL/hr at 04/17/17 0601    . acetaminophen (TYLENOL) tablet 650 mg  650 mg Oral Q4H PRN Wynetta Emery, Clanford L, MD   650 mg at 04/17/17 0601   Or  . acetaminophen (TYLENOL) solution 650 mg  650 mg Per Tube Q4H PRN Johnson, Clanford L, MD       Or  . acetaminophen (TYLENOL) suppository 650 mg  650 mg Rectal Q4H PRN Johnson, Clanford L, MD      . aspirin suppository 300 mg  300 mg Rectal Daily Johnson, Clanford  L, MD       Or  . aspirin tablet 325 mg  325 mg Oral Daily Johnson, Clanford L, MD   325 mg at 04/17/17 0901  . enoxaparin (LOVENOX) injection 40 mg  40 mg Subcutaneous Q24H Johnson, Clanford L, MD   40 mg at 04/17/17 1126  . feeding supplement (ENSURE ENLIVE) (ENSURE ENLIVE) liquid 237 mL  237 mL Oral BID BM Johnson, Clanford L, MD   237 mL at 04/17/17 0906  . finasteride (PROSCAR) tablet 5 mg  5 mg Oral Daily Dorie Rank, MD   5 mg at 04/17/17 1121  . hydrALAZINE (APRESOLINE) injection 10 mg  10 mg Intravenous Q4H PRN Johnson, Clanford L, MD      . ketotifen (ZADITOR) 0.025 % ophthalmic solution 1 drop  1 drop Both Eyes BID Wynetta Emery, Clanford L,  MD   1 drop at 04/17/17 0906  . levobunolol (BETAGAN) 0.5 % ophthalmic solution 1 drop  1 drop Both Eyes BID Dorie Rank, MD   1 drop at 04/17/17 1121  . ondansetron (ZOFRAN) tablet 4 mg  4 mg Oral TID AC & HS Johnson, Clanford L, MD   4 mg at 04/17/17 1126  . pantoprazole (PROTONIX) EC tablet 40 mg  40 mg Oral Daily Dorie Rank, MD   40 mg at 04/17/17 0901  . polyethylene glycol (MIRALAX / GLYCOLAX) packet 17 g  17 g Oral BID PRN Dorie Rank, MD      . senna-docusate (Senokot-S) tablet 1 tablet  1 tablet Oral QHS Murlean Iba, MD   1 tablet at 04/16/17 2132     Discharge Medications: Please see discharge summary for a list of discharge medications.  Relevant Imaging Results:  Relevant Lab Results:   Additional Information SSN: [id number]  Shade Flood, LCSW

## 2017-04-17 NOTE — Progress Notes (Signed)
*  PRELIMINARY RESULTS* Echocardiogram 2D Echocardiogram has been performed.  Leavy Cella 04/17/2017, 2:01 PM

## 2017-04-18 LAB — CBC
HCT: 27.8 % — ABNORMAL LOW (ref 39.0–52.0)
Hemoglobin: 9.5 g/dL — ABNORMAL LOW (ref 13.0–17.0)
MCH: 31.4 pg (ref 26.0–34.0)
MCHC: 34.2 g/dL (ref 30.0–36.0)
MCV: 91.7 fL (ref 78.0–100.0)
PLATELETS: 180 10*3/uL (ref 150–400)
RBC: 3.03 MIL/uL — AB (ref 4.22–5.81)
RDW: 13.7 % (ref 11.5–15.5)
WBC: 6 10*3/uL (ref 4.0–10.5)

## 2017-04-18 LAB — COMPREHENSIVE METABOLIC PANEL
ALK PHOS: 61 U/L (ref 38–126)
ALT: 18 U/L (ref 17–63)
AST: 20 U/L (ref 15–41)
Albumin: 2.3 g/dL — ABNORMAL LOW (ref 3.5–5.0)
Anion gap: 5 (ref 5–15)
BILIRUBIN TOTAL: 0.7 mg/dL (ref 0.3–1.2)
BUN: 20 mg/dL (ref 6–20)
CALCIUM: 7.8 mg/dL — AB (ref 8.9–10.3)
CO2: 27 mmol/L (ref 22–32)
CREATININE: 1.07 mg/dL (ref 0.61–1.24)
Chloride: 108 mmol/L (ref 101–111)
GFR, EST NON AFRICAN AMERICAN: 58 mL/min — AB (ref >60)
Glucose, Bld: 106 mg/dL — ABNORMAL HIGH (ref 65–99)
Potassium: 3.7 mmol/L (ref 3.5–5.1)
Sodium: 140 mmol/L (ref 135–145)
TOTAL PROTEIN: 4.5 g/dL — AB (ref 6.5–8.1)

## 2017-04-18 NOTE — Clinical Social Work Note (Signed)
Pt accepted for placement in a private room at [facility] in [place] per Elyse Hsu at [facility]. Anticipating possible dc on 11/1. Updated pt's son via VMM. Pt's son aware that Ambrose Pancoast will be following the rest of the week for dc planning.

## 2017-04-18 NOTE — Progress Notes (Signed)
Physical Therapy Treatment Patient Details Name: Lance Kemp MRN: [id number] DOB: 09-25-1922 Today's Date: 04/18/2017    History of Present Illness Lance Kemp is a 83 y.o. male who lives home alone with an aide that checks on him and his son that lives nearby has had multiple recent hospitalizations for various situations dealing with poor self-care at home.  He presents today with another fall.  He falls frequently.  He has dementia and chronic generalized weakness.  He has cerebrovascular disease and history of stroke and TIA.  Apparently the patient's son found him down on the ground.  The patient is unable to give history regarding events that led up to the fall.  He reported having pain all over his body.  He is only oriented to himself.  He has a past medical history detailed below.  ED workup was essentially negative.  However, ED staff noticed that the patient was not lifting his left arm or left leg.  He does have chronic left shoulder pain from a chronic rotator cuff injury.  MRI negative for acute infarct.     PT Comments    Patient extremely fearful of falling and tends to limit self when attempting gait training with RW, able to take a few steps to transfer back to bed, but to unsafe to attempt taking steps away from bedside due to patient attempting to sit once fatigued.  Patient will benefit from continued physical therapy in hospital and recommended venue below to increase strength, balance, endurance for safe ADLs and gait.    Follow Up Recommendations  SNF;Supervision/Assistance - 24 hour     Equipment Recommendations  None recommended by PT    Recommendations for Other Services       Precautions / Restrictions Precautions Precautions: Fall Restrictions Weight Bearing Restrictions: No    Mobility  Bed Mobility Overal bed mobility: Needs Assistance Bed Mobility: Sit to Supine;Supine to Sit Rolling: Min assist   Supine to sit: Mod assist Sit to supine: Min  assist      Transfers Overall transfer level: Needs assistance Equipment used: Rolling walker (2 wheeled) Transfers: Sit to/from [facility] Sit to Stand: Min assist;Mod assist Stand pivot transfers: Mod assist       General transfer comment: Patient demonstrates improvement for using RW for bed<>wheelchair transfers  Ambulation/Gait Ambulation/Gait assistance: Max assist Ambulation Distance (Feet): 2 Feet Assistive device: Rolling walker (2 wheeled) Gait Pattern/deviations: Decreased step length - right;Decreased step length - left;Decreased stance time - right;Decreased stance time - left;Decreased stride length   Gait velocity interpretation: Below normal speed for age/gender General Gait Details: Patient able to take up to 3-4 side steps with RW, before having to sit down due to weakness per patient, but more likely appears to be apprhension due to extreme fear of falling   Stairs            Wheelchair Mobility    Modified Rankin (Stroke Patients Only)       Balance Overall balance assessment: Needs assistance Sitting-balance support: Feet supported;Bilateral upper extremity supported Sitting balance-Leahy Scale: Fair     Standing balance support: Bilateral upper extremity supported;During functional activity Standing balance-Leahy Scale: Poor                              Cognition Arousal/Alertness: Awake/alert Behavior During Therapy: WFL for tasks assessed/performed Overall Cognitive Status: History of cognitive impairments - at baseline  Exercises General Exercises - Lower Extremity Long Arc Quad: Seated;AROM;Strengthening;Both;5 reps Hip Flexion/Marching: Seated;AROM;Strengthening;Both;5 reps Toe Raises: Seated;AROM;Both;10 reps Heel Raises: Seated;AROM;Strengthening;Both;10 reps    General Comments        Pertinent Vitals/Pain Pain Assessment: Faces Faces  Pain Scale: Hurts even more Pain Location: left shoulder Pain Descriptors / Indicators: Sharp;Pressure;Aching Pain Intervention(s): Limited activity within patient's tolerance;Monitored during session    Home Living                      Prior Function            PT Goals (current goals can now be found in the care plan section) Acute Rehab PT Goals Patient Stated Goal: return home PT Goal Formulation: With patient Time For Goal Achievement: 05/01/17 Potential to Achieve Goals: Good Progress towards PT goals: Progressing toward goals    Frequency    Min 3X/week      PT Plan Current plan remains appropriate    Co-evaluation              AM-PAC PT "6 Clicks" Daily Activity  Outcome Measure  Difficulty turning over in bed (including adjusting bedclothes, sheets and blankets)?: Unable Difficulty moving from lying on back to sitting on the side of the bed? : Unable Difficulty sitting down on and standing up from a chair with arms (e.g., wheelchair, bedside commode, etc,.)?: Unable Help needed moving to and from a bed to chair (including a wheelchair)?: A Lot Help needed walking in hospital room?: A Lot Help needed climbing 3-5 steps with a railing? : Total 6 Click Score: 8    End of Session Equipment Utilized During Treatment: Gait belt Activity Tolerance: Patient limited by fatigue Patient left: in bed;with call bell/phone within reach;with bed alarm set Nurse Communication: Mobility status PT Visit Diagnosis: Unsteadiness on feet (R26.81);Other abnormalities of gait and mobility (R26.89);Muscle weakness (generalized) (M62.81)     Time: 5465-6812 PT Time Calculation (min) (ACUTE ONLY): 22 min  Charges:  $Therapeutic Activity: 8-22 mins                    G Codes:       1:39 PM, 04/26/17 Lonell Grandchild, MPT Physical Therapist with [facility] 336 [phone] office [phone] mobile phone

## 2017-04-18 NOTE — Progress Notes (Signed)
PROGRESS NOTE    Lance Kemp  DUK:[id number] DOB: 04-18-1923 DOA: 04/15/2017 PCP: [name], MD     Brief Narrative:  83 year old man who previously lived at home admitted to the hospital on 10/28 after a fall.   Assessment & Plan:   Principal Problem:   Acute left hemiparesis (HCC) Active Problems:   Chronic left shoulder pain   Venous insufficiency of both lower extremities   HTN (hypertension), benign   BPH associated with nocturia   Chronic renal insufficiency, stage III (moderate) (HCC)   Chronic diastolic heart failure (HCC)   Hyperlipidemia   Moderate dementia   Basal cell carcinoma, face   Vertigo   Generalized weakness   Acute CVA (cerebrovascular accident) ([place])   Falls frequently   Question of acute CVA -To clarify patient has not had a CVA, it was initially thought that he had mild left hemiparesis however upon further examination a lot of his symptoms are related to pain in his chronic left shoulder injury.  Mild to moderate dementia -Stable, no behavioral disturbances, is followed outpatient by neurology.  Frequent falls/generalized weakness -Seen by physical therapy who is recommending SNF, to be discharge once bed placement has been secured.  Paroxysmal atrial fibrillation -Diagnosed by his PCP, not safe to be anticoagulated because of his falls and dementia, I agree with this assessment.  Chronic kidney disease stage III -Creatinine remains at baseline.  Hypertension -Fair control.  Chronic diastolic heart failure -Well compensated and stable.   DVT prophylaxis: Lovenox Code Status: DNR Family Communication: Discussed with daughter at bedside Disposition Plan: SNF placement in progress  Consultants:   None  Procedures:   None  Antimicrobials:  Anti-infectives    None       Subjective: Lying in bed, smiling, appears weak and cachectic  Objective: Vitals:   04/17/17 2047 04/17/17 2058 04/18/17 0421 04/18/17 1441   BP:  133/70 (!) 146/54 135/87  Pulse:  67 (!) 57 (!) 57  Resp:  16 16 18   Temp:  98 F (36.7 C) (!) 97.5 F (36.4 C) (!) 97.5 F (36.4 C)  TempSrc:  Oral Oral Oral  SpO2: 94% 100% 99% 98%  Weight:      Height:        Intake/Output Summary (Last 24 hours) at 04/18/17 1710 Last data filed at 04/18/17 1030  Gross per 24 hour  Intake             1215 ml  Output              625 ml  Net              590 ml   Filed Weights   04/15/17 2010 04/16/17 1111  Weight: 60.8 kg (134 lb) 61.3 kg (135 lb 1.6 oz)    Examination:  General exam: Alert, awake, oriented x 2 Respiratory system: Clear to auscultation. Respiratory effort normal. Cardiovascular system:RRR. No murmurs, rubs, gallops. Gastrointestinal system: Abdomen is nondistended, soft and nontender. No organomegaly or masses felt. Normal bowel sounds heard. Central nervous system: Alert and oriented. No focal neurological deficits. Extremities: No C/C/E, +pedal pulses Skin: No rashes, lesions or ulcers Psychiatry: Judgement and insight appear normal. Mood & affect appropriate.     Data Reviewed: I have personally reviewed following labs and imaging studies  CBC:  Recent Labs Lab 04/15/17 2354 04/17/17 1223 04/18/17 0539  WBC 9.9 6.3 6.0  NEUTROABS 7.5  --   --   HGB 12.1* 10.6* 9.5*  HCT 35.1* 31.3* 27.8*  MCV 91.4 92.6 91.7  PLT 198 150 322   Basic Metabolic Panel:  Recent Labs Lab 04/15/17 2354 04/17/17 1223 04/18/17 0539  NA 140 136 140  K 3.6 3.7 3.7  CL 105 106 108  CO2 28 25 27   GLUCOSE 141* 116* 106*  BUN 11 17 20   CREATININE 0.83 1.06 1.07  CALCIUM 8.8* 8.1* 7.8*   GFR: Estimated Creatinine Clearance: 38.2 mL/min (by C-G formula based on SCr of 1.07 mg/dL). Liver Function Tests:  Recent Labs Lab 04/17/17 1223 04/18/17 0539  AST 25 20  ALT 19 18  ALKPHOS 71 61  BILITOT 1.0 0.7  PROT 5.0* 4.5*  ALBUMIN 2.5* 2.3*   No results for input(s): LIPASE, AMYLASE in the last 168 hours. No  results for input(s): AMMONIA in the last 168 hours. Coagulation Profile: No results for input(s): INR, PROTIME in the last 168 hours. Cardiac Enzymes:  Recent Labs Lab 04/16/17 1134 04/16/17 1208 04/16/17 1801 04/17/17 1223  CKTOTAL  --  246  --  77  TROPONINI 0.22*  --  0.18*  --    BNP (last 3 results) No results for input(s): PROBNP in the last 8760 hours. HbA1C:  Recent Labs  04/16/17 1134  HGBA1C 5.5   CBG: No results for input(s): GLUCAP in the last 168 hours. Lipid Profile:  Recent Labs  04/17/17 1223  CHOL 130  HDL 36*  LDLCALC 74  TRIG 102  CHOLHDL 3.6   Thyroid Function Tests: No results for input(s): TSH, T4TOTAL, FREET4, T3FREE, THYROIDAB in the last 72 hours. Anemia Panel: No results for input(s): VITAMINB12, FOLATE, FERRITIN, TIBC, IRON, RETICCTPCT in the last 72 hours. Urine analysis:    Component Value Date/Time   COLORURINE YELLOW 04/15/2017 [place] 04/15/2017 2345   LABSPEC 1.014 04/15/2017 2345   PHURINE 6.0 04/15/2017 2345   GLUCOSEU NEGATIVE 04/15/2017 2345   HGBUR NEGATIVE 04/15/2017 2345   BILIRUBINUR NEGATIVE 04/15/2017 2345   BILIRUBINUR neg 09/24/2015 1504   KETONESUR 5 (A) 04/15/2017 2345   PROTEINUR 100 (A) 04/15/2017 2345   UROBILINOGEN 0.2 09/24/2015 1504   UROBILINOGEN 0.2 09/10/2014 1052   NITRITE NEGATIVE 04/15/2017 2345   LEUKOCYTESUR NEGATIVE 04/15/2017 2345   Sepsis Labs: @LABRCNTIP (procalcitonin:4,lacticidven:4)  )No results found for this or any previous visit (from the past 240 hour(s)).       Radiology Studies: No results found.      Scheduled Meds: .  stroke: mapping our early stages of recovery book   Does not apply Once  . aspirin  300 mg Rectal Daily   Or  . aspirin  325 mg Oral Daily  . enoxaparin (LOVENOX) injection  40 mg Subcutaneous Q24H  . feeding supplement (ENSURE ENLIVE)  237 mL Oral TID BM  . finasteride  5 mg Oral Daily  . ketotifen  1 drop Both Eyes BID  .  levobunolol  1 drop Both Eyes BID  . ondansetron  4 mg Oral TID AC & HS  . pantoprazole  40 mg Oral Daily  . senna-docusate  1 tablet Oral QHS   Continuous Infusions:   LOS: 2 days    Time spent: 35 minutes. Greater than 50% of this time was spent in direct contact with the patient coordinating care.     Lelon Frohlich, MD [facility] Pager [phone]  If 7PM-7AM, please contact night-coverage [email or web address] Password [email or web address] 04/18/2017, 5:10 PM

## 2017-04-19 ENCOUNTER — Inpatient Hospital Stay: Payer: Medicare Other | Admitting: Family Medicine

## 2017-04-19 DIAGNOSIS — R41841 Cognitive communication deficit: Secondary | ICD-10-CM | POA: Diagnosis not present

## 2017-04-19 DIAGNOSIS — R509 Fever, unspecified: Secondary | ICD-10-CM | POA: Diagnosis not present

## 2017-04-19 DIAGNOSIS — Z515 Encounter for palliative care: Secondary | ICD-10-CM | POA: Diagnosis not present

## 2017-04-19 DIAGNOSIS — Z8673 Personal history of transient ischemic attack (TIA), and cerebral infarction without residual deficits: Secondary | ICD-10-CM | POA: Diagnosis not present

## 2017-04-19 DIAGNOSIS — M25512 Pain in left shoulder: Secondary | ICD-10-CM | POA: Diagnosis not present

## 2017-04-19 DIAGNOSIS — M549 Dorsalgia, unspecified: Secondary | ICD-10-CM | POA: Diagnosis not present

## 2017-04-19 DIAGNOSIS — Z85828 Personal history of other malignant neoplasm of skin: Secondary | ICD-10-CM | POA: Diagnosis not present

## 2017-04-19 DIAGNOSIS — Z9181 History of falling: Secondary | ICD-10-CM | POA: Diagnosis not present

## 2017-04-19 DIAGNOSIS — A419 Sepsis, unspecified organism: Secondary | ICD-10-CM | POA: Diagnosis not present

## 2017-04-19 DIAGNOSIS — K219 Gastro-esophageal reflux disease without esophagitis: Secondary | ICD-10-CM | POA: Diagnosis present

## 2017-04-19 DIAGNOSIS — G8929 Other chronic pain: Secondary | ICD-10-CM | POA: Diagnosis present

## 2017-04-19 DIAGNOSIS — J181 Lobar pneumonia, unspecified organism: Secondary | ICD-10-CM | POA: Diagnosis present

## 2017-04-19 DIAGNOSIS — J9621 Acute and chronic respiratory failure with hypoxia: Secondary | ICD-10-CM | POA: Diagnosis not present

## 2017-04-19 DIAGNOSIS — R918 Other nonspecific abnormal finding of lung field: Secondary | ICD-10-CM | POA: Diagnosis not present

## 2017-04-19 DIAGNOSIS — R42 Dizziness and giddiness: Secondary | ICD-10-CM | POA: Diagnosis not present

## 2017-04-19 DIAGNOSIS — J189 Pneumonia, unspecified organism: Secondary | ICD-10-CM | POA: Diagnosis not present

## 2017-04-19 DIAGNOSIS — N170 Acute kidney failure with tubular necrosis: Secondary | ICD-10-CM | POA: Diagnosis not present

## 2017-04-19 DIAGNOSIS — Z66 Do not resuscitate: Secondary | ICD-10-CM | POA: Diagnosis present

## 2017-04-19 DIAGNOSIS — R279 Unspecified lack of coordination: Secondary | ICD-10-CM | POA: Diagnosis not present

## 2017-04-19 DIAGNOSIS — E870 Hyperosmolality and hypernatremia: Secondary | ICD-10-CM | POA: Diagnosis not present

## 2017-04-19 DIAGNOSIS — R6521 Severe sepsis with septic shock: Secondary | ICD-10-CM | POA: Diagnosis not present

## 2017-04-19 DIAGNOSIS — G629 Polyneuropathy, unspecified: Secondary | ICD-10-CM | POA: Diagnosis not present

## 2017-04-19 DIAGNOSIS — Z7982 Long term (current) use of aspirin: Secondary | ICD-10-CM | POA: Diagnosis not present

## 2017-04-19 DIAGNOSIS — J690 Pneumonitis due to inhalation of food and vomit: Secondary | ICD-10-CM | POA: Diagnosis not present

## 2017-04-19 DIAGNOSIS — Z7401 Bed confinement status: Secondary | ICD-10-CM | POA: Diagnosis not present

## 2017-04-19 DIAGNOSIS — N183 Chronic kidney disease, stage 3 (moderate): Secondary | ICD-10-CM | POA: Diagnosis not present

## 2017-04-19 DIAGNOSIS — F329 Major depressive disorder, single episode, unspecified: Secondary | ICD-10-CM | POA: Diagnosis not present

## 2017-04-19 DIAGNOSIS — R7881 Bacteremia: Secondary | ICD-10-CM | POA: Diagnosis not present

## 2017-04-19 DIAGNOSIS — R630 Anorexia: Secondary | ICD-10-CM | POA: Diagnosis not present

## 2017-04-19 DIAGNOSIS — N179 Acute kidney failure, unspecified: Secondary | ICD-10-CM | POA: Diagnosis not present

## 2017-04-19 DIAGNOSIS — F0390 Unspecified dementia without behavioral disturbance: Secondary | ICD-10-CM | POA: Diagnosis not present

## 2017-04-19 DIAGNOSIS — G9341 Metabolic encephalopathy: Secondary | ICD-10-CM | POA: Diagnosis not present

## 2017-04-19 DIAGNOSIS — R6520 Severe sepsis without septic shock: Secondary | ICD-10-CM | POA: Diagnosis present

## 2017-04-19 DIAGNOSIS — E785 Hyperlipidemia, unspecified: Secondary | ICD-10-CM | POA: Diagnosis not present

## 2017-04-19 DIAGNOSIS — R0602 Shortness of breath: Secondary | ICD-10-CM | POA: Diagnosis not present

## 2017-04-19 DIAGNOSIS — E860 Dehydration: Secondary | ICD-10-CM | POA: Diagnosis present

## 2017-04-19 DIAGNOSIS — I130 Hypertensive heart and chronic kidney disease with heart failure and stage 1 through stage 4 chronic kidney disease, or unspecified chronic kidney disease: Secondary | ICD-10-CM | POA: Diagnosis not present

## 2017-04-19 DIAGNOSIS — N401 Enlarged prostate with lower urinary tract symptoms: Secondary | ICD-10-CM | POA: Diagnosis present

## 2017-04-19 DIAGNOSIS — I5032 Chronic diastolic (congestive) heart failure: Secondary | ICD-10-CM | POA: Diagnosis not present

## 2017-04-19 DIAGNOSIS — Z7189 Other specified counseling: Secondary | ICD-10-CM | POA: Diagnosis not present

## 2017-04-19 DIAGNOSIS — R079 Chest pain, unspecified: Secondary | ICD-10-CM | POA: Diagnosis not present

## 2017-04-19 DIAGNOSIS — G8194 Hemiplegia, unspecified affecting left nondominant side: Secondary | ICD-10-CM | POA: Diagnosis not present

## 2017-04-19 DIAGNOSIS — F419 Anxiety disorder, unspecified: Secondary | ICD-10-CM | POA: Diagnosis present

## 2017-04-19 DIAGNOSIS — R0902 Hypoxemia: Secondary | ICD-10-CM | POA: Diagnosis not present

## 2017-04-19 DIAGNOSIS — R2681 Unsteadiness on feet: Secondary | ICD-10-CM | POA: Diagnosis not present

## 2017-04-19 DIAGNOSIS — R351 Nocturia: Secondary | ICD-10-CM | POA: Diagnosis present

## 2017-04-19 DIAGNOSIS — R0989 Other specified symptoms and signs involving the circulatory and respiratory systems: Secondary | ICD-10-CM | POA: Diagnosis not present

## 2017-04-19 DIAGNOSIS — M6281 Muscle weakness (generalized): Secondary | ICD-10-CM | POA: Diagnosis not present

## 2017-04-19 DIAGNOSIS — H409 Unspecified glaucoma: Secondary | ICD-10-CM | POA: Diagnosis present

## 2017-04-19 DIAGNOSIS — N3281 Overactive bladder: Secondary | ICD-10-CM | POA: Diagnosis not present

## 2017-04-19 DIAGNOSIS — R29898 Other symptoms and signs involving the musculoskeletal system: Secondary | ICD-10-CM | POA: Diagnosis not present

## 2017-04-19 DIAGNOSIS — M75112 Incomplete rotator cuff tear or rupture of left shoulder, not specified as traumatic: Secondary | ICD-10-CM | POA: Diagnosis not present

## 2017-04-19 DIAGNOSIS — I480 Paroxysmal atrial fibrillation: Secondary | ICD-10-CM | POA: Diagnosis present

## 2017-04-19 DIAGNOSIS — I872 Venous insufficiency (chronic) (peripheral): Secondary | ICD-10-CM | POA: Diagnosis present

## 2017-04-19 DIAGNOSIS — A4102 Sepsis due to Methicillin resistant Staphylococcus aureus: Secondary | ICD-10-CM | POA: Diagnosis present

## 2017-04-19 LAB — CBC
HCT: 29.8 % — ABNORMAL LOW (ref 39.0–52.0)
Hemoglobin: 9.9 g/dL — ABNORMAL LOW (ref 13.0–17.0)
MCH: 31 pg (ref 26.0–34.0)
MCHC: 33.2 g/dL (ref 30.0–36.0)
MCV: 93.4 fL (ref 78.0–100.0)
PLATELETS: 191 10*3/uL (ref 150–400)
RBC: 3.19 MIL/uL — AB (ref 4.22–5.81)
RDW: 13.7 % (ref 11.5–15.5)
WBC: 6.4 10*3/uL (ref 4.0–10.5)

## 2017-04-19 LAB — COMPREHENSIVE METABOLIC PANEL
ALK PHOS: 64 U/L (ref 38–126)
ALT: 18 U/L (ref 17–63)
AST: 20 U/L (ref 15–41)
Albumin: 2.3 g/dL — ABNORMAL LOW (ref 3.5–5.0)
Anion gap: 5 (ref 5–15)
BUN: 16 mg/dL (ref 6–20)
CALCIUM: 8 mg/dL — AB (ref 8.9–10.3)
CO2: 29 mmol/L (ref 22–32)
CREATININE: 0.92 mg/dL (ref 0.61–1.24)
Chloride: 105 mmol/L (ref 101–111)
GFR calc non Af Amer: >60 mL/min (ref >60)
GLUCOSE: 110 mg/dL — AB (ref 65–99)
Potassium: 4.3 mmol/L (ref 3.5–5.1)
SODIUM: 139 mmol/L (ref 135–145)
Total Bilirubin: 0.6 mg/dL (ref 0.3–1.2)
Total Protein: 4.6 g/dL — ABNORMAL LOW (ref 6.5–8.1)

## 2017-04-19 NOTE — Clinical Social Work Placement (Signed)
   CLINICAL SOCIAL WORK PLACEMENT  NOTE  Date:  04/19/2017  Patient Details  Name: Lance Kemp MRN: [id number] Date of Birth: [birth year]  Clinical Social Work is seeking post-discharge placement for this patient at the [place] level of care (*CSW will initial, date and re-position this form in  chart as items are completed):  Yes   Patient/family provided with [place] Work Department's list of facilities offering this level of care within the geographic area requested by the patient (or if unable, by the patient's family).  Yes   Patient/family informed of their freedom to choose among providers that offer the needed level of care, that participate in Medicare, Medicaid or managed care program needed by the patient, have an available bed and are willing to accept the patient.  Yes   Patient/family informed of [facility]'s ownership interest in [facility] and [facility], as well as of the fact that they are under no obligation to receive care at these facilities.  PASRR submitted to EDS on 04/17/17     PASRR number received on 04/17/17     Existing PASRR number confirmed on       FL2 transmitted to all facilities in geographic area requested by pt/family on       FL2 transmitted to all facilities within larger geographic area on       Patient informed that his/her managed care company has contracts with or will negotiate with certain facilities, including the following:            Patient/family informed of bed offers received.  Patient chooses bed at [facility]     Physician recommends and patient chooses bed at      Patient to be transferred to [facility] on 04/19/17.  Patient to be transferred to facility by [facility]     Patient family notified on 04/19/17 of transfer.  Name of family member notified:  Voicemail message left for son, chris      PHYSICIAN       Additional Comment:  Elyse Hsu at the facility  notified of discharge and discharge clinicals sent via [employer].   _______________________________________________ Ihor Gully, LCSW 04/19/2017, 2:44 PM

## 2017-04-19 NOTE — Progress Notes (Signed)
Discharged to [facility] in stable condition via ambulance.

## 2017-04-19 NOTE — Care Management (Signed)
Pt discharging to SNF today. CSW has made [facility] rep, Vaughan Basta, aware of DC plan.

## 2017-04-19 NOTE — Care Management Important Message (Signed)
Important Message  Patient Details  Name: Krishon Adkison MRN: [id number] Date of Birth: [birth year]   Medicare Important Message Given:  Yes    Sherald Barge, RN 04/19/2017, 1:53 PM

## 2017-04-19 NOTE — Discharge Summary (Signed)
Physician Discharge Summary  Lance Kemp PPI:[id number] DOB: 1922/10/11 DOA: 04/15/2017  PCP: [name], MD  Admit date: 04/15/2017 Discharge date: 04/19/2017  Time spent: 45 minutes  Recommendations for Outpatient Follow-up:  -Will be discharged to SNF today.  Discharge Diagnoses:  Principal Problem:   Acute left hemiparesis (HCC) Active Problems:   Chronic left shoulder pain   Venous insufficiency of both lower extremities   HTN (hypertension), benign   BPH associated with nocturia   Chronic renal insufficiency, stage III (moderate) (HCC)   Chronic diastolic heart failure (HCC)   Hyperlipidemia   Moderate dementia   Basal cell carcinoma, face   Vertigo   Generalized weakness   Acute CVA (cerebrovascular accident) ([place])   Falls frequently   Discharge Condition: Stable and improved  Filed Weights   04/15/17 2010 04/16/17 1111  Weight: 60.8 kg (134 lb) 61.3 kg (135 lb 1.6 oz)    History of present illness:  As per Dr. Wynetta Emery on 10/29: Lance Kemp is a 83 y.o. male who lives home alone with an aide that checks on him and his son that lives nearby has had multiple recent hospitalizations for various situations dealing with poor self-care at home.  He presents today with another fall.  He falls frequently.  He has dementia and chronic generalized weakness.  He has cerebrovascular disease and history of stroke and TIA.  Apparently the patient's son found him down on the ground.  The patient is unable to give history regarding events that led up to the fall.  He reported having pain all over his body.  He is only oriented to himself.  He has a past medical history detailed below.  ED workup was essentially negative.  However, ED staff noticed that the patient was not lifting his left arm or left leg.  He does have chronic left shoulder pain from a chronic rotator cuff injury.  There was concern that he has had an acute CVA.  We are unable to determine at this time  what his baseline is.  He is being admitted for possible acute CVA.  He will receive further workup with an MRI study.  CT of the brain did not show acute findings.  He was given aspirin.  He was out of the TPA window per ED physician because his fall was unwitnessed and we are not able to determine when symptoms started.  We have made multiple attempts to contact patient's son but his phone goes to voicemail only.  Hospital Course:   Question of acute CVA -To clarify patient has not had a CVA, it was initially thought that he had mild left hemiparesis however upon further examination a lot of his symptoms are related to pain in his chronic left shoulder injury.  Mild to moderate dementia -Stable, no behavioral disturbances, is followed outpatient by neurology.  Frequent falls/generalized weakness -Seen by physical therapy who is recommending SNF, to be discharge once bed placement has been secured.  Paroxysmal atrial fibrillation -Diagnosed by his PCP, not safe to be anticoagulated because of his falls and dementia, I agree with this assessment.  Chronic kidney disease stage III -Creatinine remains at baseline.  Hypertension -Fair control.  Chronic diastolic heart failure -Well compensated and stable.   Procedures:  None  Consultations:  None  Discharge Instructions  Discharge Instructions    Diet - low sodium heart healthy    Complete by:  As directed    Increase activity slowly    Complete  by:  As directed      Allergies as of 04/19/2017      Reactions   Altace [ramipril] Other (See Comments)   Reaction:  Unknown    Amoxicillin Other (See Comments)   Reaction:  Unknown  Has patient had a PCN reaction causing immediate rash, facial/tongue/throat swelling, SOB or lightheadedness with hypotension: Unsure Has patient had a PCN reaction causing severe rash involving mucus membranes or skin necrosis: Unsure Has patient had a PCN reaction that required  hospitalization Unsure Has patient had a PCN reaction occurring within the last 10 years: Unsure If all of the above answers are "NO", then may proceed with Cephalosporin use.   Cozaar [losartan Potassium] Other (See Comments)   Reaction:  Unknown    Tramadol Other (See Comments)   Makes patient feel very drowsy/almost too strong to take      Medication List    TAKE these medications   aspirin 81 MG chewable tablet Chew 81 mg by mouth daily.   azelastine 0.05 % ophthalmic solution Commonly known as:  OPTIVAR Place 2 drops into both eyes every evening.   feeding supplement (ENSURE ENLIVE) Liqd Take 237 mLs by mouth 2 (two) times daily between meals.   finasteride 5 MG tablet Commonly known as:  PROSCAR TAKE ONE TABLET BY MOUTH EVERY DAY   ICAPS AREDS 2 PO Take 1 capsule by mouth 2 (two) times daily.   levobunolol 0.5 % ophthalmic solution Commonly known as:  BETAGAN Place 1 drop into both eyes 2 (two) times daily. What changed:  how much to take  when to take this   omeprazole 40 MG capsule Commonly known as:  PRILOSEC Take 1 capsule (40 mg total) by mouth daily.   ondansetron 4 MG tablet Commonly known as:  ZOFRAN Take 1 tablet (4 mg total) by mouth 4 (four) times daily -  before meals and at bedtime.   polyethylene glycol powder powder Commonly known as:  GLYCOLAX/MIRALAX Take 17 g by mouth 2 (two) times daily as needed.      Allergies  Allergen Reactions  . Altace [Ramipril] Other (See Comments)    Reaction:  Unknown   . Amoxicillin Other (See Comments)    Reaction:  Unknown  Has patient had a PCN reaction causing immediate rash, facial/tongue/throat swelling, SOB or lightheadedness with hypotension: Unsure Has patient had a PCN reaction causing severe rash involving mucus membranes or skin necrosis: Unsure Has patient had a PCN reaction that required hospitalization Unsure Has patient had a PCN reaction occurring within the last 10 years: Unsure If all  of the above answers are "NO", then may proceed with Cephalosporin use.  [name] Potassium] Other (See Comments)    Reaction:  Unknown   . Tramadol Other (See Comments)    Makes patient feel very drowsy/almost too strong to take   Contact information for after-discharge care    Destination    [facility] .   Specialty:  [place] information: [address] [place] [phone]               The results of significant diagnostics from this hospitalization (including imaging, microbiology, ancillary and laboratory) are listed below for reference.    Significant Diagnostic Studies: Dg Chest 2 View  Result Date: 04/04/2017 CLINICAL DATA:  Shortness of Breath EXAM: CHEST  2 VIEW COMPARISON:  03/21/2017 FINDINGS: Cardiac shadow is stable. Aortic calcifications are again seen. The lungs are  well aerated bilaterally without focal infiltrate or sizable effusion. No acute bony abnormality is seen. IMPRESSION: No active cardiopulmonary disease. Electronically Signed   By: Inez Catalina M.D.   On: 04/04/2017 18:51   Dg Chest 2 View  Result Date: 03/22/2017 CLINICAL DATA:  Shortness of breath EXAM: CHEST  2 VIEW COMPARISON:  02/11/2017 FINDINGS: Normal heart size and stable mediastinal contours. There is no edema, consolidation, effusion, or pneumothorax. Cholecystectomy clips. IMPRESSION: No evidence of active disease.  Stable compared to prior. Electronically Signed   By: Monte Fantasia M.D.   On: 03/22/2017 08:52   Ct Head Wo Contrast  Result Date: 04/16/2017 CLINICAL DATA:  Head trauma, headache. Unwitnessed fall. Increased confusion. EXAM: CT HEAD WITHOUT CONTRAST TECHNIQUE: Contiguous axial images were obtained from the base of the skull through the vertex without intravenous contrast. COMPARISON:  Head CT 04/04/2017, additional priors reviewed. FINDINGS: Brain: Advanced atrophy, stable from prior exam.  Advanced chronic small vessel ischemia, also unchanged. No acute hemorrhage, evidence of ischemia or subdural fluid collection. Stable ventricular prominence likely secondary to atrophy. No midline shift or mass effect. Vascular: Atherosclerosis of skullbase vasculature without hyperdense vessel or abnormal calcification. Skull: No fracture or focal lesion. Sinuses/Orbits: Paranasal sinuses and mastoid air cells are clear. The visualized orbits are unremarkable. Other: Mild parietal scalp contusion suspected, greater on the right. IMPRESSION: No acute intracranial abnormality. No skull fracture. Stable atrophy and advanced atrophy and chronic small vessel ischemia. Electronically Signed   By: Jeb Levering M.D.   On: 04/16/2017 00:32   Ct Head Wo Contrast  Result Date: 04/04/2017 CLINICAL DATA:  Generalized weakness. EXAM: CT HEAD WITHOUT CONTRAST TECHNIQUE: Contiguous axial images were obtained from the base of the skull through the vertex without intravenous contrast. COMPARISON:  Head CT 02/11/2017 and brain MRI 07/27/2015 FINDINGS: Brain: No mass lesion, intraparenchymal hemorrhage or extra-axial collection. No evidence of acute cortical infarct. There is advanced global atrophy without specific lobar predilection. Extensive periventricular hypoattenuation consistent with chronic ischemic microangiopathy. Vascular: Atherosclerotic calcification of the vertebral and internal carotid arteries at the skull base. Skull: Normal visualized skull base, calvarium and extracranial soft tissues. Sinuses/Orbits: No sinus fluid levels or advanced mucosal thickening. No mastoid effusion. Normal orbits. IMPRESSION: Advanced atrophy and sequelae of chronic ischemic microangiopathy without acute abnormality. Electronically Signed   By: Ulyses Jarred M.D.   On: 04/04/2017 21:54   Mr Brain Wo Contrast  Result Date: 04/16/2017 CLINICAL DATA:  [place] yesterday.  Worsened confusion. EXAM: MRI HEAD WITHOUT CONTRAST MRA  HEAD WITHOUT CONTRAST TECHNIQUE: Multiplanar, multiecho pulse sequences of the brain and surrounding structures were obtained without intravenous contrast. Angiographic images of the head were obtained using MRA technique without contrast. COMPARISON:  CT 04/16/2017.  MRI 07/27/2015. FINDINGS: MRI HEAD FINDINGS Brain: Diffusion imaging does not show any acute or subacute infarction. There chronic small-vessel ischemic changes throughout the pons. No focal cerebellar insult. Cerebral hemispheres show generalized atrophy with confluent chronic small vessel ischemic changes throughout the white matter. Ventricles are prominent, presumed secondary to central atrophy, unchanged since the previous study. Normal pressure hydrocephalus not excluded but not favored. No mass lesion, hemorrhage, hydrocephalus or extra-axial collection. Vascular: Major vessels at the base of the brain show flow. Skull and upper cervical spine: Negative Sinuses/Orbits: Clear/normal Other: None MRA HEAD FINDINGS Both internal carotid arteries are widely patent through the skullbase and siphon regions. The anterior and middle cerebral vessels are patent without proximal stenosis, aneurysm or vascular malformation. Aplastic or chronically occluded A1  segment on the right with both anterior cerebral arteries receiving there supply from the left carotid circulation. Diminutive temporal branch of the MCA on the right. Both vertebral arteries are patent to the basilar showing antegrade flow. No basilar stenosis. Posterior circulation branch vessels show flow. IMPRESSION: No change since the previous examination. Atrophy and chronic small-vessel ischemic changes. No acute or traumatic finding. Intracranial MR angiography of the large and medium size vessels is negative. Electronically Signed   By: Nelson Chimes M.D.   On: 04/16/2017 13:23   US Carotid Bilateral (at [facility] Only)  Result Date: 04/16/2017 CLINICAL DATA:  CVA.  Dementia.  Altered  mental status. EXAM: BILATERAL CAROTID DUPLEX ULTRASOUND TECHNIQUE: Pearline Cables scale imaging, color Doppler and duplex ultrasound were performed of bilateral carotid and vertebral arteries in the neck. COMPARISON:  None. FINDINGS: Criteria: Quantification of carotid stenosis is based on velocity parameters that correlate the residual internal carotid diameter with NASCET-based stenosis levels, using the diameter of the distal internal carotid lumen as the denominator for stenosis measurement. The following velocity measurements were obtained: RIGHT ICA:  72 cm/sec CCA:  86 cm/sec SYSTOLIC ICA/CCA RATIO:  0.8 DIASTOLIC ICA/CCA RATIO:  1.5 ECA:  77 cm/sec LEFT ICA:  104 cm/sec CCA:  160 cm/sec SYSTOLIC ICA/CCA RATIO:  1.0 DIASTOLIC ICA/CCA RATIO:  1.7 ECA:  216 cm/sec RIGHT CAROTID ARTERY: Minimal smooth soft plaque in the bulb. Low resistance internal carotid Doppler pattern. Mild calcified plaque in the lower internal carotid. RIGHT VERTEBRAL ARTERY:  Antegrade. LEFT CAROTID ARTERY: There is focal mixed plaque in the mid common carotid artery. There is also moderate irregular mixed plaque in the bulb. Low resistance internal carotid Doppler pattern is preserved. LEFT VERTEBRAL ARTERY:  Antegrade. IMPRESSION: Less than 50% stenosis in the right and left internal carotid arteries. Electronically Signed   By: Marybelle Killings M.D.   On: 04/16/2017 13:41   Dg Shoulder Left  Result Date: 04/16/2017 CLINICAL DATA:  Shoulder pain EXAM: LEFT SHOULDER - 2+ VIEW COMPARISON:  02/12/2015 FINDINGS: AC joint degenerative change. High-riding humeral head as before. No fracture or dislocation. The left lung apex is clear IMPRESSION: 1. No acute osseous abnormality 2. High-riding humeral head, consistent with rotator cuff disease 3. AC joint degenerative change Electronically Signed   By: Donavan Foil M.D.   On: 04/16/2017 00:22   Dg Knee Complete 4 Views Right  Result Date: 04/16/2017 CLINICAL DATA:  Knee pain EXAM: RIGHT KNEE -  COMPLETE 4+ VIEW COMPARISON:  None. FINDINGS: No fracture or malalignment. No large knee effusion. Mild patellofemoral degenerative changes. Mild degenerative changes of the medial and lateral compartment. Atherosclerotic calcification IMPRESSION: Mild degenerative changes.  No acute osseous abnormality Electronically Signed   By: Donavan Foil M.D.   On: 04/16/2017 00:23   Dg Abd Portable 1v  Result Date: 04/05/2017 CLINICAL DATA:  Generalized weakness, nausea EXAM: PORTABLE ABDOMEN - 1 VIEW COMPARISON:  Abdomen films of 08/28/2014 FINDINGS: A portable supine view of the abdomen shows no bowel obstruction. No radiographic evidence of constipation is seen. Surgical clips are noted in the right upper quadrant from prior cholecystectomy. There are degenerative changes in the mid to lower lumbar spine. IMPRESSION: 1. No bowel obstruction.  No radiographic evidence of constipation. 2. Degenerative change in the mid to lower lumbar spine. Electronically Signed   By: Ivar Drape M.D.   On: 04/05/2017 16:08   Mr Jodene Nam Head/brain VP Cm  Result Date: 04/16/2017 CLINICAL DATA:  [place] yesterday.  Worsened  confusion. EXAM: MRI HEAD WITHOUT CONTRAST MRA HEAD WITHOUT CONTRAST TECHNIQUE: Multiplanar, multiecho pulse sequences of the brain and surrounding structures were obtained without intravenous contrast. Angiographic images of the head were obtained using MRA technique without contrast. COMPARISON:  CT 04/16/2017.  MRI 07/27/2015. FINDINGS: MRI HEAD FINDINGS Brain: Diffusion imaging does not show any acute or subacute infarction. There chronic small-vessel ischemic changes throughout the pons. No focal cerebellar insult. Cerebral hemispheres show generalized atrophy with confluent chronic small vessel ischemic changes throughout the white matter. Ventricles are prominent, presumed secondary to central atrophy, unchanged since the previous study. Normal pressure hydrocephalus not excluded but not favored. No mass lesion,  hemorrhage, hydrocephalus or extra-axial collection. Vascular: Major vessels at the base of the brain show flow. Skull and upper cervical spine: Negative Sinuses/Orbits: Clear/normal Other: None MRA HEAD FINDINGS Both internal carotid arteries are widely patent through the skullbase and siphon regions. The anterior and middle cerebral vessels are patent without proximal stenosis, aneurysm or vascular malformation. Aplastic or chronically occluded A1 segment on the right with both anterior cerebral arteries receiving there supply from the left carotid circulation. Diminutive temporal branch of the MCA on the right. Both vertebral arteries are patent to the basilar showing antegrade flow. No basilar stenosis. Posterior circulation branch vessels show flow. IMPRESSION: No change since the previous examination. Atrophy and chronic small-vessel ischemic changes. No acute or traumatic finding. Intracranial MR angiography of the large and medium size vessels is negative. Electronically Signed   By: Nelson Chimes M.D.   On: 04/16/2017 13:23    Microbiology: No results found for this or any previous visit (from the past 240 hour(s)).   Labs: Basic Metabolic Panel:  Recent Labs Lab 04/15/17 2354 04/17/17 1223 04/18/17 0539 04/19/17 0535  NA 140 136 140 139  K 3.6 3.7 3.7 4.3  CL 105 106 108 105  CO2 28 25 27 29   GLUCOSE 141* 116* 106* 110*  BUN 11 17 20 16   CREATININE 0.83 1.06 1.07 0.92  CALCIUM 8.8* 8.1* 7.8* 8.0*   Liver Function Tests:  Recent Labs Lab 04/17/17 1223 04/18/17 0539 04/19/17 0535  AST 25 20 20   ALT 19 18 18   ALKPHOS 71 61 64  BILITOT 1.0 0.7 0.6  PROT 5.0* 4.5* 4.6*  ALBUMIN 2.5* 2.3* 2.3*   No results for input(s): LIPASE, AMYLASE in the last 168 hours. No results for input(s): AMMONIA in the last 168 hours. CBC:  Recent Labs Lab 04/15/17 2354 04/17/17 1223 04/18/17 0539 04/19/17 0535  WBC 9.9 6.3 6.0 6.4  NEUTROABS 7.5  --   --   --   HGB 12.1* 10.6* 9.5*  9.9*  HCT 35.1* 31.3* 27.8* 29.8*  MCV 91.4 92.6 91.7 93.4  PLT 198 150 180 191   Cardiac Enzymes:  Recent Labs Lab 04/16/17 1134 04/16/17 1208 04/16/17 1801 04/17/17 1223  CKTOTAL  --  246  --  77  TROPONINI 0.22*  --  0.18*  --    BNP: BNP (last 3 results)  Recent Labs  02/11/17 1933  BNP 194.0*    ProBNP (last 3 results) No results for input(s): PROBNP in the last 8760 hours.  CBG: No results for input(s): GLUCAP in the last 168 hours.     SignedLelon Frohlich  [facility] Pager: [phone] 04/19/2017, 1:14 PM

## 2017-04-22 ENCOUNTER — Encounter: Payer: Self-pay | Admitting: Family Medicine

## 2017-04-23 DIAGNOSIS — R630 Anorexia: Secondary | ICD-10-CM | POA: Diagnosis not present

## 2017-04-23 DIAGNOSIS — I130 Hypertensive heart and chronic kidney disease with heart failure and stage 1 through stage 4 chronic kidney disease, or unspecified chronic kidney disease: Secondary | ICD-10-CM | POA: Diagnosis not present

## 2017-04-23 DIAGNOSIS — I5032 Chronic diastolic (congestive) heart failure: Secondary | ICD-10-CM | POA: Diagnosis not present

## 2017-05-01 ENCOUNTER — Ambulatory Visit: Payer: Medicare Other | Admitting: Sports Medicine

## 2017-05-01 DIAGNOSIS — I5032 Chronic diastolic (congestive) heart failure: Secondary | ICD-10-CM | POA: Diagnosis not present

## 2017-05-01 DIAGNOSIS — F0390 Unspecified dementia without behavioral disturbance: Secondary | ICD-10-CM | POA: Diagnosis not present

## 2017-05-01 DIAGNOSIS — K219 Gastro-esophageal reflux disease without esophagitis: Secondary | ICD-10-CM | POA: Diagnosis not present

## 2017-05-01 DIAGNOSIS — G629 Polyneuropathy, unspecified: Secondary | ICD-10-CM | POA: Diagnosis not present

## 2017-05-21 DIAGNOSIS — I130 Hypertensive heart and chronic kidney disease with heart failure and stage 1 through stage 4 chronic kidney disease, or unspecified chronic kidney disease: Secondary | ICD-10-CM | POA: Diagnosis not present

## 2017-05-21 DIAGNOSIS — R630 Anorexia: Secondary | ICD-10-CM | POA: Diagnosis not present

## 2017-05-21 DIAGNOSIS — I5032 Chronic diastolic (congestive) heart failure: Secondary | ICD-10-CM | POA: Diagnosis not present

## 2017-05-22 ENCOUNTER — Inpatient Hospital Stay (HOSPITAL_COMMUNITY)
Admission: EM | Admit: 2017-05-22 | Discharge: 2017-05-30 | DRG: 871 | Disposition: A | Discharge status: Hospice | Payer: Medicare Other | Attending: Internal Medicine | Admitting: Internal Medicine

## 2017-05-22 ENCOUNTER — Encounter (HOSPITAL_COMMUNITY): Payer: Self-pay

## 2017-05-22 ENCOUNTER — Emergency Department (HOSPITAL_COMMUNITY): Payer: Medicare Other

## 2017-05-22 DIAGNOSIS — R6521 Severe sepsis with septic shock: Secondary | ICD-10-CM | POA: Diagnosis not present

## 2017-05-22 DIAGNOSIS — I519 Heart disease, unspecified: Secondary | ICD-10-CM | POA: Diagnosis not present

## 2017-05-22 DIAGNOSIS — A419 Sepsis, unspecified organism: Secondary | ICD-10-CM

## 2017-05-22 DIAGNOSIS — R351 Nocturia: Secondary | ICD-10-CM | POA: Diagnosis present

## 2017-05-22 DIAGNOSIS — N183 Chronic kidney disease, stage 3 unspecified: Secondary | ICD-10-CM | POA: Diagnosis present

## 2017-05-22 DIAGNOSIS — N401 Enlarged prostate with lower urinary tract symptoms: Secondary | ICD-10-CM | POA: Diagnosis present

## 2017-05-22 DIAGNOSIS — F0390 Unspecified dementia without behavioral disturbance: Secondary | ICD-10-CM | POA: Diagnosis present

## 2017-05-22 DIAGNOSIS — I480 Paroxysmal atrial fibrillation: Secondary | ICD-10-CM | POA: Diagnosis present

## 2017-05-22 DIAGNOSIS — R7881 Bacteremia: Secondary | ICD-10-CM | POA: Diagnosis present

## 2017-05-22 DIAGNOSIS — I872 Venous insufficiency (chronic) (peripheral): Secondary | ICD-10-CM | POA: Diagnosis present

## 2017-05-22 DIAGNOSIS — E785 Hyperlipidemia, unspecified: Secondary | ICD-10-CM | POA: Diagnosis present

## 2017-05-22 DIAGNOSIS — J181 Lobar pneumonia, unspecified organism: Secondary | ICD-10-CM | POA: Diagnosis present

## 2017-05-22 DIAGNOSIS — E870 Hyperosmolality and hypernatremia: Secondary | ICD-10-CM | POA: Diagnosis present

## 2017-05-22 DIAGNOSIS — F419 Anxiety disorder, unspecified: Secondary | ICD-10-CM | POA: Diagnosis present

## 2017-05-22 DIAGNOSIS — A4102 Sepsis due to Methicillin resistant Staphylococcus aureus: Principal | ICD-10-CM | POA: Diagnosis present

## 2017-05-22 DIAGNOSIS — M549 Dorsalgia, unspecified: Secondary | ICD-10-CM | POA: Diagnosis present

## 2017-05-22 DIAGNOSIS — H409 Unspecified glaucoma: Secondary | ICD-10-CM | POA: Diagnosis present

## 2017-05-22 DIAGNOSIS — J9621 Acute and chronic respiratory failure with hypoxia: Secondary | ICD-10-CM | POA: Diagnosis present

## 2017-05-22 DIAGNOSIS — Z85828 Personal history of other malignant neoplasm of skin: Secondary | ICD-10-CM

## 2017-05-22 DIAGNOSIS — Z66 Do not resuscitate: Secondary | ICD-10-CM | POA: Diagnosis present

## 2017-05-22 DIAGNOSIS — R6520 Severe sepsis without septic shock: Secondary | ICD-10-CM | POA: Diagnosis present

## 2017-05-22 DIAGNOSIS — N179 Acute kidney failure, unspecified: Secondary | ICD-10-CM | POA: Diagnosis present

## 2017-05-22 DIAGNOSIS — E860 Dehydration: Secondary | ICD-10-CM | POA: Diagnosis present

## 2017-05-22 DIAGNOSIS — R778 Other specified abnormalities of plasma proteins: Secondary | ICD-10-CM

## 2017-05-22 DIAGNOSIS — G629 Polyneuropathy, unspecified: Secondary | ICD-10-CM | POA: Diagnosis present

## 2017-05-22 DIAGNOSIS — R7989 Other specified abnormal findings of blood chemistry: Secondary | ICD-10-CM

## 2017-05-22 DIAGNOSIS — Z7401 Bed confinement status: Secondary | ICD-10-CM | POA: Diagnosis not present

## 2017-05-22 DIAGNOSIS — J189 Pneumonia, unspecified organism: Secondary | ICD-10-CM

## 2017-05-22 DIAGNOSIS — R279 Unspecified lack of coordination: Secondary | ICD-10-CM | POA: Diagnosis not present

## 2017-05-22 DIAGNOSIS — G8929 Other chronic pain: Secondary | ICD-10-CM | POA: Diagnosis present

## 2017-05-22 DIAGNOSIS — F03A0 Unspecified dementia, mild, without behavioral disturbance, psychotic disturbance, mood disturbance, and anxiety: Secondary | ICD-10-CM | POA: Diagnosis present

## 2017-05-22 DIAGNOSIS — R0602 Shortness of breath: Secondary | ICD-10-CM | POA: Diagnosis not present

## 2017-05-22 DIAGNOSIS — K219 Gastro-esophageal reflux disease without esophagitis: Secondary | ICD-10-CM | POA: Diagnosis present

## 2017-05-22 DIAGNOSIS — Z452 Encounter for adjustment and management of vascular access device: Secondary | ICD-10-CM | POA: Diagnosis not present

## 2017-05-22 DIAGNOSIS — G9341 Metabolic encephalopathy: Secondary | ICD-10-CM | POA: Diagnosis present

## 2017-05-22 DIAGNOSIS — Z515 Encounter for palliative care: Secondary | ICD-10-CM | POA: Diagnosis present

## 2017-05-22 DIAGNOSIS — Z87891 Personal history of nicotine dependence: Secondary | ICD-10-CM

## 2017-05-22 DIAGNOSIS — I130 Hypertensive heart and chronic kidney disease with heart failure and stage 1 through stage 4 chronic kidney disease, or unspecified chronic kidney disease: Secondary | ICD-10-CM | POA: Diagnosis present

## 2017-05-22 DIAGNOSIS — N3281 Overactive bladder: Secondary | ICD-10-CM | POA: Diagnosis present

## 2017-05-22 DIAGNOSIS — E872 Acidosis, unspecified: Secondary | ICD-10-CM | POA: Insufficient documentation

## 2017-05-22 DIAGNOSIS — R0989 Other specified symptoms and signs involving the circulatory and respiratory systems: Secondary | ICD-10-CM | POA: Diagnosis not present

## 2017-05-22 DIAGNOSIS — Z8673 Personal history of transient ischemic attack (TIA), and cerebral infarction without residual deficits: Secondary | ICD-10-CM | POA: Diagnosis not present

## 2017-05-22 DIAGNOSIS — I5032 Chronic diastolic (congestive) heart failure: Secondary | ICD-10-CM | POA: Diagnosis present

## 2017-05-22 DIAGNOSIS — N170 Acute kidney failure with tubular necrosis: Secondary | ICD-10-CM | POA: Diagnosis not present

## 2017-05-22 DIAGNOSIS — I10 Essential (primary) hypertension: Secondary | ICD-10-CM | POA: Diagnosis present

## 2017-05-22 DIAGNOSIS — Z7189 Other specified counseling: Secondary | ICD-10-CM | POA: Diagnosis not present

## 2017-05-22 DIAGNOSIS — F329 Major depressive disorder, single episode, unspecified: Secondary | ICD-10-CM | POA: Diagnosis present

## 2017-05-22 DIAGNOSIS — Z7982 Long term (current) use of aspirin: Secondary | ICD-10-CM

## 2017-05-22 DIAGNOSIS — Z79899 Other long term (current) drug therapy: Secondary | ICD-10-CM

## 2017-05-22 DIAGNOSIS — R531 Weakness: Secondary | ICD-10-CM

## 2017-05-22 DIAGNOSIS — R918 Other nonspecific abnormal finding of lung field: Secondary | ICD-10-CM | POA: Diagnosis not present

## 2017-05-22 DIAGNOSIS — I252 Old myocardial infarction: Secondary | ICD-10-CM

## 2017-05-22 HISTORY — DX: Heart failure, unspecified: I50.9

## 2017-05-22 HISTORY — DX: Gastro-esophageal reflux disease without esophagitis: K21.9

## 2017-05-22 HISTORY — DX: Venous insufficiency (chronic) (peripheral): I87.2

## 2017-05-22 HISTORY — DX: Spondylosis, unspecified: M47.9

## 2017-05-22 HISTORY — DX: Dorsalgia, unspecified: M54.9

## 2017-05-22 HISTORY — DX: History of falling: Z91.81

## 2017-05-22 HISTORY — DX: Unspecified atrial fibrillation: I48.91

## 2017-05-22 HISTORY — DX: Overactive bladder: N32.81

## 2017-05-22 HISTORY — DX: Polyneuropathy, unspecified: G62.9

## 2017-05-22 HISTORY — DX: Cerebral infarction, unspecified: I63.9

## 2017-05-22 HISTORY — DX: Muscle weakness (generalized): M62.81

## 2017-05-22 HISTORY — DX: Nocturia: R35.1

## 2017-05-22 LAB — CBC WITH DIFFERENTIAL/PLATELET
BASOS PCT: 0 %
Basophils Absolute: 0 10*3/uL (ref 0.0–0.1)
EOS ABS: 0 10*3/uL (ref 0.0–0.7)
EOS PCT: 0 %
HCT: 34 % — ABNORMAL LOW (ref 39.0–52.0)
HEMOGLOBIN: 10.6 g/dL — AB (ref 13.0–17.0)
Lymphocytes Relative: 8 %
Lymphs Abs: 0.7 10*3/uL (ref 0.7–4.0)
MCH: 30.2 pg (ref 26.0–34.0)
MCHC: 31.2 g/dL (ref 30.0–36.0)
MCV: 96.9 fL (ref 78.0–100.0)
Monocytes Absolute: 0.4 10*3/uL (ref 0.1–1.0)
Monocytes Relative: 4 %
NEUTROS PCT: 88 %
Neutro Abs: 8.4 10*3/uL — ABNORMAL HIGH (ref 1.7–7.7)
PLATELETS: 257 10*3/uL (ref 150–400)
RBC: 3.51 MIL/uL — AB (ref 4.22–5.81)
RDW: 14.7 % (ref 11.5–15.5)
WBC: 9.5 10*3/uL (ref 4.0–10.5)

## 2017-05-22 LAB — COMPREHENSIVE METABOLIC PANEL
ALBUMIN: 2.1 g/dL — AB (ref 3.5–5.0)
ALT: 15 U/L — ABNORMAL LOW (ref 17–63)
ANION GAP: 9 (ref 5–15)
AST: 29 U/L (ref 15–41)
Alkaline Phosphatase: 95 U/L (ref 38–126)
BILIRUBIN TOTAL: 0.9 mg/dL (ref 0.3–1.2)
BUN: 30 mg/dL — ABNORMAL HIGH (ref 6–20)
CO2: 29 mmol/L (ref 22–32)
Calcium: 8.2 mg/dL — ABNORMAL LOW (ref 8.9–10.3)
Chloride: 111 mmol/L (ref 101–111)
Creatinine, Ser: 1.84 mg/dL — ABNORMAL HIGH (ref 0.61–1.24)
GFR, EST AFRICAN AMERICAN: 35 mL/min — AB (ref >60)
GFR, EST NON AFRICAN AMERICAN: 30 mL/min — AB (ref >60)
Glucose, Bld: 116 mg/dL — ABNORMAL HIGH (ref 65–99)
POTASSIUM: 3.9 mmol/L (ref 3.5–5.1)
Sodium: 149 mmol/L — ABNORMAL HIGH (ref 135–145)
TOTAL PROTEIN: 5.2 g/dL — AB (ref 6.5–8.1)

## 2017-05-22 LAB — PROTIME-INR
INR: 1.42
PROTHROMBIN TIME: 17.2 s — AB (ref 11.4–15.2)

## 2017-05-22 LAB — I-STAT CG4 LACTIC ACID, ED
LACTIC ACID, VENOUS: 2.76 mmol/L — AB (ref 0.5–1.9)
LACTIC ACID, VENOUS: 3.12 mmol/L — AB (ref 0.5–1.9)

## 2017-05-22 MED ORDER — VANCOMYCIN HCL IN DEXTROSE 750-5 MG/150ML-% IV SOLN
750.0000 mg | INTRAVENOUS | Status: DC
  Administered 2017-05-23 – 2017-05-27 (×4): 750 mg via INTRAVENOUS
  Filled 2017-05-22 (×7): qty 150

## 2017-05-22 MED ORDER — DEXTROSE 5 % IV SOLN
1.0000 g | INTRAVENOUS | Status: DC
  Administered 2017-05-23 – 2017-05-27 (×5): 1 g via INTRAVENOUS
  Filled 2017-05-22 (×5): qty 1

## 2017-05-22 MED ORDER — SODIUM CHLORIDE 0.9 % IV BOLUS (SEPSIS)
1000.0000 mL | Freq: Once | INTRAVENOUS | Status: AC
  Administered 2017-05-22: 1000 mL via INTRAVENOUS

## 2017-05-22 MED ORDER — GUAIFENESIN 100 MG/5ML PO SOLN
200.0000 mg | ORAL | Status: DC | PRN
  Filled 2017-05-22: qty 10

## 2017-05-22 MED ORDER — ACETAMINOPHEN 650 MG RE SUPP
650.0000 mg | Freq: Once | RECTAL | Status: AC
Start: 2017-05-22 — End: 2017-05-22
  Administered 2017-05-22: 650 mg via RECTAL
  Filled 2017-05-22: qty 1

## 2017-05-22 MED ORDER — MAGNESIUM SULFATE 2 GM/50ML IV SOLN
2.0000 g | Freq: Once | INTRAVENOUS | Status: AC
  Administered 2017-05-22: 2 g via INTRAVENOUS
  Filled 2017-05-22: qty 50

## 2017-05-22 MED ORDER — ACETAMINOPHEN 650 MG RE SUPP: 650.0000 mg | Freq: Four times a day (QID) | RECTAL | Status: DC | PRN

## 2017-05-22 MED ORDER — SODIUM CHLORIDE 0.9% FLUSH: 3.0000 mL | INTRAVENOUS | Status: DC | PRN

## 2017-05-22 MED ORDER — FINASTERIDE 5 MG PO TABS
5.0000 mg | ORAL_TABLET | Freq: Every day | ORAL | Status: DC
  Filled 2017-05-22 (×6): qty 1

## 2017-05-22 MED ORDER — LEVALBUTEROL HCL 1.25 MG/0.5ML IN NEBU
1.2500 mg | INHALATION_SOLUTION | Freq: Four times a day (QID) | RESPIRATORY_TRACT | Status: DC
  Administered 2017-05-22: 1.25 mg via RESPIRATORY_TRACT
  Filled 2017-05-22: qty 0.5

## 2017-05-22 MED ORDER — ONDANSETRON HCL 4 MG PO TABS: 4.0000 mg | ORAL_TABLET | Freq: Four times a day (QID) | ORAL | Status: DC | PRN

## 2017-05-22 MED ORDER — HEPARIN SODIUM (PORCINE) 5000 UNIT/ML IJ SOLN
5000.0000 [IU] | Freq: Three times a day (TID) | INTRAMUSCULAR | Status: DC
  Administered 2017-05-23 – 2017-05-26 (×8): 5000 [IU] via SUBCUTANEOUS
  Filled 2017-05-22 (×8): qty 1

## 2017-05-22 MED ORDER — SODIUM CHLORIDE 0.9 % IV BOLUS (SEPSIS)
250.0000 mL | Freq: Once | INTRAVENOUS | Status: AC
  Administered 2017-05-22: 250 mL via INTRAVENOUS

## 2017-05-22 MED ORDER — MORPHINE SULFATE (PF) 2 MG/ML IV SOLN
2.0000 mg | Freq: Once | INTRAVENOUS | Status: AC
  Administered 2017-05-22: 2 mg via INTRAVENOUS
  Filled 2017-05-22: qty 1

## 2017-05-22 MED ORDER — DEXTROSE 5 % IV SOLN
2.0000 g | Freq: Once | INTRAVENOUS | Status: AC
  Administered 2017-05-22: 2 g via INTRAVENOUS
  Filled 2017-05-22: qty 2

## 2017-05-22 MED ORDER — ASPIRIN 81 MG PO CHEW
81.0000 mg | CHEWABLE_TABLET | Freq: Every day | ORAL | Status: DC
  Administered 2017-05-22: 81 mg via ORAL
  Filled 2017-05-22 (×2): qty 1

## 2017-05-22 MED ORDER — SODIUM CHLORIDE 0.9% FLUSH
3.0000 mL | Freq: Two times a day (BID) | INTRAVENOUS | Status: DC
  Administered 2017-05-22 – 2017-05-30 (×12): 3 mL via INTRAVENOUS

## 2017-05-22 MED ORDER — SODIUM CHLORIDE 0.9 % IV SOLN: 250.0000 mL | INTRAVENOUS | Status: DC | PRN

## 2017-05-22 MED ORDER — LEVALBUTEROL HCL 1.25 MG/0.5ML IN NEBU
1.2500 mg | INHALATION_SOLUTION | Freq: Four times a day (QID) | RESPIRATORY_TRACT | Status: DC
  Administered 2017-05-23 – 2017-05-26 (×15): 1.25 mg via RESPIRATORY_TRACT
  Filled 2017-05-22 (×15): qty 0.5

## 2017-05-22 MED ORDER — ACETAMINOPHEN 500 MG PO TABS: 500.0000 mg | ORAL_TABLET | ORAL | Status: DC | PRN

## 2017-05-22 MED ORDER — LEVOBUNOLOL HCL 0.5 % OP SOLN
1.0000 [drp] | Freq: Two times a day (BID) | OPHTHALMIC | Status: DC
  Administered 2017-05-23 – 2017-05-30 (×10): 1 [drp] via OPHTHALMIC
  Filled 2017-05-22 (×2): qty 5

## 2017-05-22 MED ORDER — POLYETHYLENE GLYCOL 3350 17 G PO PACK: 17.0000 g | PACK | Freq: Two times a day (BID) | ORAL | Status: DC | PRN

## 2017-05-22 MED ORDER — VANCOMYCIN HCL IN DEXTROSE 1-5 GM/200ML-% IV SOLN
1000.0000 mg | Freq: Once | INTRAVENOUS | Status: AC
  Administered 2017-05-22: 1000 mg via INTRAVENOUS
  Filled 2017-05-22: qty 200

## 2017-05-22 MED ORDER — PANTOPRAZOLE SODIUM 40 MG PO TBEC
40.0000 mg | DELAYED_RELEASE_TABLET | Freq: Every day | ORAL | Status: DC
  Administered 2017-05-22: 40 mg via ORAL
  Filled 2017-05-22 (×2): qty 1

## 2017-05-22 MED ORDER — DEXTROSE 5 % IV SOLN
INTRAVENOUS | Status: DC
  Administered 2017-05-22: 22:00:00 via INTRAVENOUS

## 2017-05-22 MED ORDER — ONDANSETRON HCL 4 MG/2ML IJ SOLN: 4.0000 mg | Freq: Four times a day (QID) | INTRAMUSCULAR | Status: DC | PRN

## 2017-05-22 MED ORDER — IPRATROPIUM BROMIDE 0.02 % IN SOLN
0.5000 mg | Freq: Four times a day (QID) | RESPIRATORY_TRACT | Status: DC
  Administered 2017-05-22 – 2017-05-26 (×16): 0.5 mg via RESPIRATORY_TRACT
  Filled 2017-05-22 (×16): qty 2.5

## 2017-05-22 MED ORDER — KETOTIFEN FUMARATE 0.025 % OP SOLN
2.0000 [drp] | Freq: Every day | OPHTHALMIC | Status: DC
  Administered 2017-05-23 – 2017-05-24 (×2): 2 [drp] via OPHTHALMIC
  Filled 2017-05-22: qty 5

## 2017-05-22 MED ORDER — METHYLPREDNISOLONE SODIUM SUCC 125 MG IJ SOLR
125.0000 mg | Freq: Once | INTRAMUSCULAR | Status: AC
  Administered 2017-05-22: 125 mg via INTRAVENOUS
  Filled 2017-05-22: qty 2

## 2017-05-22 NOTE — ED Notes (Signed)
Fluids and abx started again.

## 2017-05-22 NOTE — ED Provider Notes (Signed)
[facility] EMERGENCY DEPARTMENT Provider Note   CSN: [id number] Arrival date & time: 05/22/17  1614     History   Chief Complaint Chief Complaint  Patient presents with  . Respiratory Distress   Level 5 caveat dementia, acuity of situation.  History obtained from paperwork coming patient as well as from his son [name] via telephone patient has had diminished appetite and less active for the past 2 weeks and noted to be in respiratory distress today at the skilled nursing facility.  EMS treated patient with oxygen nonrebreather mask HPI Lance Kemp is a 83 y.o. male.  HPI  Past Medical History:  Diagnosis Date  . Atrial fibrillation ([place])   . Basal cell carcinoma 2011; 2016   Face  . Basal cell carcinoma   . BPH with obstruction/lower urinary tract symptoms   . Cervical spondylosis    with miltilevel changes leading to narrowing or compression of spinal nerve foramina.  +spinal stenosis.  Mild spondylolisthesis L7-T1.  Marland Kitchen CHF (congestive heart failure) ([place])   . Chronic back pain   . Chronic diastolic heart failure ([place])   . Chronic left shoulder pain 09/14/2013  . Chronic renal insufficiency, stage III (moderate) (HCC) 2013   CrCl in the 50s-60s  . Chronic venous insufficiency    Compression hose  . 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  No significant residual deficit except short term memory impairment  . Dementia 2016   Dr. Delice Lesch 08/2014--started aricept but pt did not tolerate.  Exelon trial started by Dr. Delice Lesch 05/2016.  Marland Kitchen Depression   . Dorsalgia   . Frequent headaches   . GERD (gastroesophageal reflux disease)   . Glaucoma   . History of falling   . Hypertension   . Muscle weakness   . Nocturia   . Normocytic anemia 2015   Secondary to CRI (iron studies ok 12/2013)  . NSTEMI (non-ST elevated myocardial infarction) ([place]) 06/2011   [place]: Troponin mildly elevated, normal EKG, normal ECHO, pt declined cardiology consult--per old PCP records.    Marland Kitchen OAB (overactive bladder)   . Osteoarthritis   . Overactive bladder   . Peripheral neuropathy 2013   Dx'd by neuro in [place] at the time the patient was hospitalized briefly for pontine CVA.  Marland Kitchen Polyneuropathy   . Spondylosis   . Stroke ([place])    tia  . Urine incontinence   . Venous insufficiency   . Vertigo     Patient Active Problem List   Diagnosis Date Noted  . Acute CVA (cerebrovascular accident) ([place]) 04/16/2017  . Falls frequently 04/16/2017  . Generalized weakness 04/05/2017  . Vertigo 02/04/2017  . Basal cell carcinoma, face 04/07/2015  . Moderate dementia 11/24/2014  . OAB (overactive bladder) 10/17/2014  . Essential hypertension 08/25/2014  . Hyperlipidemia 08/25/2014  . Peripheral edema 04/16/2014  . Chronic diastolic heart failure ([place]) 03/04/2014  . Chronic renal insufficiency, stage III (moderate) (HCC)   . Chronic shoulder pain 02/19/2014  . SOB (shortness of breath) 02/18/2014  . Lumbago 12/18/2013  . Acute left hemiparesis ([place]) 11/21/2013  . BPH associated with nocturia 11/21/2013  . Short-term memory loss 10/24/2013  . Chronic left shoulder pain 09/14/2013  . Venous insufficiency of both lower extremities 09/14/2013  . HTN (hypertension), benign 09/14/2013  . Normocytic anemia 06/19/2013    Past Surgical History:  Procedure Laterality Date  . CHOLECYSTECTOMY    . NM VQ LUNG SCAN ([place] [facility])  09/2014   Low risk  . TONSILECTOMY,  ADENOIDECTOMY, BILATERAL MYRINGOTOMY AND TUBES    . TRANSTHORACIC ECHOCARDIOGRAM  12/18/13   Grade 1 diast dysf, o/w normal       Home Medications    Prior to Admission medications   Medication Sig Start Date End Date Taking? Authorizing Provider  acetaminophen (TYLENOL) 500 MG tablet Take 500 mg by mouth every 4 (four) hours as needed for mild pain or moderate pain.   Yes [provider]  acetaminophen (TYLENOL) 650 MG suppository Place 650 mg rectally every 6 (six) hours as needed for fever.   Yes [provider]  albuterol (PROVENTIL) (2.5 MG/3ML) 0.083% nebulizer solution Take 2.5 mg by nebulization every 4 (four) hours as needed for wheezing or shortness of breath.   Yes [provider]  aspirin 81 MG chewable tablet Chew 81 mg by mouth daily.   Yes [provider]  azelastine (OPTIVAR) 0.05 % ophthalmic solution Place 2 drops into both eyes every evening.   Yes [provider]  finasteride (PROSCAR) 5 MG tablet TAKE ONE TABLET BY MOUTH EVERY DAY 01/25/17  Yes McGowen, [name], MD  guaifenesin (GERI-TUSSIN) 100 MG/5ML syrup Take 200 mg by mouth every 4 (four) hours as needed for cough.   Yes [provider]  levobunolol (BETAGAN) 0.5 % ophthalmic solution Place 1 drop into both eyes 2 (two) times daily. 06/21/16  Yes McGowen, [name], MD  Multiple Vitamins-Minerals (ICAPS AREDS 2 PO) Take 1 capsule by mouth 2 (two) times daily.   Yes [provider]  omeprazole (PRILOSEC) 40 MG capsule Take 1 capsule (40 mg total) by mouth daily. 03/21/17  Yes McGowen, [name], MD  ondansetron (ZOFRAN) 4 MG tablet Take 1 tablet (4 mg total) by mouth 4 (four) times daily -  before meals and at bedtime. 04/06/17  Yes Short, Noah Delaine, MD  polyethylene glycol powder (GLYCOLAX/MIRALAX) powder Take 17 g by mouth 2 (two) times daily as needed. 06/21/16  Yes McGowen, [name], MD  feeding supplement, ENSURE ENLIVE, (ENSURE ENLIVE) LIQD Take 237 mLs by mouth 2 (two) times daily between meals. 04/06/17   Janece Canterbury, MD    Family History Family History  Problem Relation Age of Onset  . Cancer Mother   . Heart disease Mother     Social History Social History   Tobacco Use  . Smoking status: Former Smoker    Types: Pipe  . Smokeless tobacco: Never Used  Substance Use Topics  . Alcohol use: No    Alcohol/week: 0.0 oz  . Drug use: No     Allergies   Altace [ramipril]; Amoxicillin; Cozaar [losartan potassium]; and Tramadol   Review of Systems Review of  Systems  Unable to perform ROS: Dementia  Constitutional: Positive for appetite change.  Respiratory: Positive for shortness of breath.   Musculoskeletal:       Bedbound for the past several weeks     Physical Exam Updated Vital Signs BP (!) 76/45 (BP Location: Left Arm)   Pulse (!) 102   Temp (!) 102.7 F (39.3 C) (Rectal)   Resp 13   Wt 70.3 kg (155 lb)   SpO2 97%   BMI 24.28 kg/m   Physical Exam  Constitutional:  Ill-appearing  HENT:  Head: Normocephalic and atraumatic.  Mucous membranes dry  Eyes: EOM are normal.  Neck: Neck supple. No tracheal deviation present. No thyromegaly present.  Cardiovascular: Regular rhythm.  No murmur heard. Mildly tachycardic  Pulmonary/Chest: Effort normal.  Diffuse rhonchi, coughing frequently  Abdominal:  Soft. Bowel sounds are normal. He exhibits no distension. There is no tenderness.  Musculoskeletal: Normal range of motion. He exhibits no edema or tenderness.  Neurological: He is alert. Coordination normal.  Skin: Skin is warm and dry.  Psychiatric:  Obtainable dementia  Nursing note and vitals reviewed.    ED Treatments / Results  Labs (all labs ordered are listed, but only abnormal results are displayed) Labs Reviewed  I-STAT CG4 LACTIC ACID, ED - Abnormal; Notable for the following components:      Result Value   Lactic Acid, Venous 2.76 (*)    All other components within normal limits  CULTURE, BLOOD (ROUTINE X 2)  CULTURE, BLOOD (ROUTINE X 2)  COMPREHENSIVE METABOLIC PANEL  CBC WITH DIFFERENTIAL/PLATELET  PROTIME-INR  URINALYSIS, ROUTINE W REFLEX MICROSCOPIC  I-STAT CG4 LACTIC ACID, ED    EKG  EKG Interpretation None     ED ECG REPORT   Date: 05/22/2017  Rate:102  Rhythm: sinus tachycardia  QRS Axis: left  Intervals: normal  ST/T Wave abnormalities: nonspecific T wave changes  Conduction Disutrbances:none  Narrative Interpretation:   Old EKG Reviewed: Faster than previous tracing  I have  personally reviewed the EKG tracing and agree with the computerized printout as noted.  Radiology No results found.  Procedures Procedures (including critical care time)  Medications Ordered in ED Medications  sodium chloride 0.9 % bolus 1,000 mL (not administered)    And  sodium chloride 0.9 % bolus 1,000 mL (1,000 mLs Intravenous New Bag/Given 05/22/17 1731)    And  sodium chloride 0.9 % bolus 250 mL (not administered)  ceFEPIme (MAXIPIME) 2 g in dextrose 5 % 50 mL IVPB (not administered)  vancomycin (VANCOCIN) IVPB 1000 mg/200 mL premix (1,000 mg Intravenous New Bag/Given 05/22/17 1734)  acetaminophen (TYLENOL) suppository 650 mg (not administered)   Results for orders placed or performed during the hospital encounter of 05/22/17  Culture, blood (Routine x 2)  Result Value Ref Range   Specimen Description BLOOD RIGHT HAND    Special Requests      BOTTLES DRAWN AEROBIC AND ANAEROBIC Blood Culture adequate volume   Culture PENDING    Report Status PENDING   Culture, blood (Routine x 2)  Result Value Ref Range   Specimen Description LEFT ANTECUBITAL    Special Requests      BOTTLES DRAWN AEROBIC AND ANAEROBIC Blood Culture adequate volume   Culture PENDING    Report Status PENDING   Comprehensive metabolic panel  Result Value Ref Range   Sodium 149 (H) 135 - 145 mmol/L   Potassium 3.9 3.5 - 5.1 mmol/L   Chloride 111 101 - 111 mmol/L   CO2 29 22 - 32 mmol/L   Glucose, Bld 116 (H) 65 - 99 mg/dL   BUN 30 (H) 6 - 20 mg/dL   Creatinine, Ser 1.84 (H) 0.61 - 1.24 mg/dL   Calcium 8.2 (L) 8.9 - 10.3 mg/dL   Total Protein 5.2 (L) 6.5 - 8.1 g/dL   Albumin 2.1 (L) 3.5 - 5.0 g/dL   AST 29 15 - 41 U/L   ALT 15 (L) 17 - 63 U/L   Alkaline Phosphatase 95 38 - 126 U/L   Total Bilirubin 0.9 0.3 - 1.2 mg/dL   GFR calc non Af Amer 30 (L) >60 mL/min   GFR calc Af Amer 35 (L) >60 mL/min   Anion gap 9 5 - 15  CBC with Differential  Result Value Ref Range   WBC 9.5 4.0 - 10.5  K/uL   RBC  3.51 (L) 4.22 - 5.81 MIL/uL   Hemoglobin 10.6 (L) 13.0 - 17.0 g/dL   HCT 34.0 (L) 39.0 - 52.0 %   MCV 96.9 78.0 - 100.0 fL   MCH 30.2 26.0 - 34.0 pg   MCHC 31.2 30.0 - 36.0 g/dL   RDW 14.7 11.5 - 15.5 %   Platelets 257 150 - 400 K/uL   Neutrophils Relative % 88 %   Neutro Abs 8.4 (H) 1.7 - 7.7 K/uL   Lymphocytes Relative 8 %   Lymphs Abs 0.7 0.7 - 4.0 K/uL   Monocytes Relative 4 %   Monocytes Absolute 0.4 0.1 - 1.0 K/uL   Eosinophils Relative 0 %   Eosinophils Absolute 0.0 0.0 - 0.7 K/uL   Basophils Relative 0 %   Basophils Absolute 0.0 0.0 - 0.1 K/uL  Protime-INR  Result Value Ref Range   Prothrombin Time 17.2 (H) 11.4 - 15.2 seconds   INR 1.42   I-Stat CG4 Lactic Acid, ED  Result Value Ref Range   Lactic Acid, Venous 2.76 (HH) 0.5 - 1.9 mmol/L   Comment NOTIFIED PHYSICIAN   I-Stat CG4 Lactic Acid, ED  (not at  [facility])  Result Value Ref Range   Lactic Acid, Venous 3.12 (HH) 0.5 - 1.9 mmol/L   Dg Chest Portable 1 View  Result Date: 05/22/2017 CLINICAL DATA:  Hypoxemia, increased mucus secretions, fever this morning, history atrial fibrillation, CHF, hypertension, NSTEMI, former smoker, prior stroke EXAM: PORTABLE CHEST 1 VIEW COMPARISON:  Portable exam 1727 hours compared to 04/04/2017 FINDINGS: Normal heart size and mediastinal contours. Atherosclerotic calcification aorta. Slightly decreased lung volumes versus previous exam with bibasilar opacities, favoring infiltrate/pneumonia at RIGHT base and either atelectasis or infiltrate at LEFT base. Upper lungs clear. No pleural effusion or pneumothorax. Bones demineralized. IMPRESSION: RIGHT basilar infiltrate consistent with pneumonia. Additional infiltrate versus atelectasis at LEFT base. Electronically Signed   By: Lavonia Dana M.D.   On: 05/22/2017 17:48   Chest x-ray reviewed by me Initial Impression / Assessment and Plan / ED Course  I have reviewed the triage vital signs and the nursing notes.  Pertinent labs & imaging results  that were available during my care of the patient were reviewed by me and considered in my medical decision making (see chart for details).     Code sepsis called based on sirs criteria fever, hypotension.  Source of infection felt to be respiratory and his son Jerold Coombe health care power of attorney.  He reiterates the patient is DNR CODE STATUS 7:30 PM after 1 L of normal saline infused patient is more alert and interactive., answers simple questions. Patient refused second lactate draw which is all right by me.  I consulted Dr. Olevia Bowens who will arrange for admission  Palliative care consult ordered by me Final Clinical Impressions(s) / ED Diagnoses  #1 septic shock #2  Healthcare associated pneumonia #3 acute kidney injury CRITICAL CARE Performed by: Orlie Dakin Total critical care time: 70 minutes Critical care time was exclusive of separately billable procedures and treating other patients. Critical care was necessary to treat or prevent imminent or life-threatening deterioration. Critical care was time spent personally by me on the following activities: development of treatment plan with patient and/or surrogate as well as nursing, discussions with consultants, evaluation of patient's response to treatment, examination of patient, obtaining history from patient or surrogate, ordering and performing treatments and interventions, ordering and review of laboratory studies, ordering and review of radiographic studies,  pulse oximetry and re-evaluation of patient's condition. Final diagnoses:  None    ED Discharge Orders    None    .   Orlie Dakin, MD 05/22/17 [phone]

## 2017-05-22 NOTE — ED Notes (Signed)
Pt in trendelenburg  ?

## 2017-05-22 NOTE — ED Triage Notes (Addendum)
EMS reports pt was treated for pneumonia in Nov.  Reports respiratory distress today.  Would not tolerate Cpap for ems.  Fever today and facility gave 650mg  suppository.  Last temp was 102, hr 104.  02 sat mid 80's per ems.  Pt from [facility] in [place].  Pt alert, denies pain.

## 2017-05-22 NOTE — ED Notes (Signed)
Pt has gurgling respirations and appears uncomfortable, Dr Winfred Leeds made aware- new orders received.

## 2017-05-22 NOTE — H&P (Signed)
History and Physical    Antron Seth NUU:[id number] DOB: [birth year] DOA: 05/22/2017  PCP: [name], MD   Patient coming from:   I have personally briefly reviewed patient's old medical records in [place]  Chief Complaint: Fever and shortness of breath.  HPI: Lance Kemp is a 83 y.o. male with medical history significant of paroxysmal atrial fibrillation, basal cell carcinoma of the face, BPH, cervical spondylosis, diastolic CHF,Partial small atrial fibrillation, basal cell carcinoma of the face, BP: Cervical spondylosis, diastolic CHF, chronic back pain, chronic left shoulder pain, stage III chronic renal insufficiency, chronic venous insufficiency, history of CVA, dementia, frequent headaches, GERD, glaucoma, hypertension, nocturia is coming to the emergency department via EMS from nursing home facility with complaints of fever and shortness of breath. The patient was recently treated for pneumonia.  Chronic ED Course: Initial vital signs in the emergency department temperature 39.3C, pulse 102, respirations 21, blood pressure 83/38 mmHg and O2 sat 100% on nonrebreather oxygen.  He received normal saline 2250 mL IV bolus, cefepime and vancomycin.  I added magnesium sulfate 2 g IVPB, Solu-Medrol and Xopenex/Atrovent neb treatments.  His workup showed lactic acid of 2.76.  WBC of 9.5 with 88% neutrophils, hemoglobin 10.6 g/dL and platelets 257.  PT was 17.2 and INR 1.42.  Sodium 149, potassium 3.9, chloride 111, CO2 29 millimol/L.  Glucose 116, BUN 30, creatinine 1.84 and calcium 8.2 mg/dL.  Total protein 5.2 and albumin 2.1 g/dL.  Cultures x2 were drawn.  His chest radiograph showed a right basilar infiltrate.    Review of Systems: Unable to obtain due to dementia.   Past Medical History:  Diagnosis Date  . Atrial fibrillation ([place])   . Basal cell carcinoma 2011; 2016   Face  . Basal cell carcinoma   . BPH with obstruction/lower urinary tract symptoms   . Cervical  spondylosis    with miltilevel changes leading to narrowing or compression of spinal nerve foramina.  +spinal stenosis.  Mild spondylolisthesis L7-T1.  Marland Kitchen CHF (congestive heart failure) ([place])   . Chronic back pain   . Chronic diastolic heart failure ([place])   . Chronic left shoulder pain 09/14/2013  . Chronic renal insufficiency, stage III (moderate) (HCC) 2013   CrCl in the 50s-60s  . Chronic venous insufficiency    Compression hose  . 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  No significant residual deficit except short term memory impairment  . Dementia 2016   Dr. Delice Lesch 08/2014--started aricept but pt did not tolerate.  Exelon trial started by Dr. Delice Lesch 05/2016.  Marland Kitchen Depression   . Dorsalgia   . Frequent headaches   . GERD (gastroesophageal reflux disease)   . Glaucoma   . History of falling   . Hypertension   . Muscle weakness   . Nocturia   . Normocytic anemia 2015   Secondary to CRI (iron studies ok 12/2013)  . NSTEMI (non-ST elevated myocardial infarction) ([place]) 06/2011   [place]: Troponin mildly elevated, normal EKG, normal ECHO, pt declined cardiology consult--per old PCP records.  Marland Kitchen OAB (overactive bladder)   . Osteoarthritis   . Overactive bladder   . Peripheral neuropathy 2013   Dx'd by neuro in [place] at the time the patient was hospitalized briefly for pontine CVA.  Marland Kitchen Polyneuropathy   . Spondylosis   . Stroke ([place])    tia  . Urine incontinence   . Venous insufficiency   . Vertigo     Past Surgical History:  Procedure Laterality  Date  . CHOLECYSTECTOMY    . NM VQ LUNG SCAN ([place] [facility])  09/2014   Low risk  . TONSILECTOMY, ADENOIDECTOMY, BILATERAL MYRINGOTOMY AND TUBES    . TRANSTHORACIC ECHOCARDIOGRAM  12/18/13   Grade 1 diast dysf, o/w normal     reports that he has quit smoking. His smoking use included pipe. he has never used smokeless tobacco. He reports that he does not drink alcohol or use drugs.  Allergies  Allergen Reactions  . Altace [Ramipril]  Other (See Comments)    Reaction:  Unknown   . Amoxicillin Other (See Comments)    Reaction:  Unknown  Has patient had a PCN reaction causing immediate rash, facial/tongue/throat swelling, SOB or lightheadedness with hypotension: Unsure Has patient had a PCN reaction causing severe rash involving mucus membranes or skin necrosis: Unsure Has patient had a PCN reaction that required hospitalization Unsure Has patient had a PCN reaction occurring within the last 10 years: Unsure If all of the above answers are "NO", then may proceed with Cephalosporin use.  [name] Potassium] Other (See Comments)    Reaction:  Unknown   . Tramadol Other (See Comments)    Makes patient feel very drowsy/almost too strong to take    Family History  Problem Relation Age of Onset  . Cancer Mother   . Heart disease Mother     Prior to Admission medications   Medication Sig Start Date End Date Taking? Authorizing Provider  acetaminophen (TYLENOL) 500 MG tablet Take 500 mg by mouth every 4 (four) hours as needed for mild pain or moderate pain.   Yes [provider]  acetaminophen (TYLENOL) 650 MG suppository Place 650 mg rectally every 6 (six) hours as needed for fever.   Yes [provider]  albuterol (PROVENTIL) (2.5 MG/3ML) 0.083% nebulizer solution Take 2.5 mg by nebulization every 4 (four) hours as needed for wheezing or shortness of breath.   Yes [provider]  aspirin 81 MG chewable tablet Chew 81 mg by mouth daily.   Yes [provider]  azelastine (OPTIVAR) 0.05 % ophthalmic solution Place 2 drops into both eyes every evening.   Yes [provider]  finasteride (PROSCAR) 5 MG tablet TAKE ONE TABLET BY MOUTH EVERY DAY 01/25/17  Yes McGowen, [name], MD  guaifenesin (GERI-TUSSIN) 100 MG/5ML syrup Take 200 mg by mouth every 4 (four) hours as needed for cough.   Yes [provider]  levobunolol (BETAGAN) 0.5 % ophthalmic solution Place 1 drop  into both eyes 2 (two) times daily. 06/21/16  Yes McGowen, [name], MD  Multiple Vitamins-Minerals (ICAPS AREDS 2 PO) Take 1 capsule by mouth 2 (two) times daily.   Yes [provider]  omeprazole (PRILOSEC) 40 MG capsule Take 1 capsule (40 mg total) by mouth daily. 03/21/17  Yes McGowen, [name], MD  ondansetron (ZOFRAN) 4 MG tablet Take 1 tablet (4 mg total) by mouth 4 (four) times daily -  before meals and at bedtime. 04/06/17  Yes Short, Noah Delaine, MD  polyethylene glycol powder (GLYCOLAX/MIRALAX) powder Take 17 g by mouth 2 (two) times daily as needed. 06/21/16  Yes McGowen, [name], MD  feeding supplement, ENSURE ENLIVE, (ENSURE ENLIVE) LIQD Take 237 mLs by mouth 2 (two) times daily between meals. 04/06/17   Janece Canterbury, MD    Physical Exam: Vitals:   05/22/17 1910 05/22/17 1913 05/22/17 1950 05/22/17 2006  BP: (!) 82/57  97/70 (!) 96/41  Pulse: 93  88 87  Resp: 18  19 20   Temp:  99.6 F (37.6 C)    TempSrc:  Rectal    SpO2: 96%  96% 98%  Weight:        Constitutional: Frail, elderly, in mild-to-moderate respiratory distress. Eyes: PERRL, lids and conjunctivae normal ENMT: Mucous membranes are very dry. Posterior pharynx clear of any exudate or lesions. Neck: normal, supple, no masses, no thyromegaly Respiratory: Tachypneic, decreased breath sounds on bases with bilateral rhonchi and crackles.  Positive supraclavicular muscle use. Cardiovascular: Tachycardic at 102 bpm, no murmurs / rubs / gallops. No extremities edema. 2+ pedal pulses. No carotid bruits.  Abdomen: Soft, no tenderness, no masses palpated. No hepatosplenomegaly. Bowel sounds positive.  Musculoskeletal: no clubbing / cyanosis.  Good ROM, no contractures. Normal muscle tone.  Skin: Frail skin with multiple areas of ecchymosis. Neurologic: CN 2-12 grossly intact. Sensation intact, moves all extremities, but has generalized weakness. Psychiatric:  Alert and oriented x 2.  Anxious mood.    Labs on  Admission: I have personally reviewed following labs and imaging studies  CBC: Recent Labs  Lab 05/22/17 1647  WBC 9.5  NEUTROABS 8.4*  HGB 10.6*  HCT 34.0*  MCV 96.9  PLT 299   Basic Metabolic Panel: Recent Labs  Lab 05/22/17 1647  NA 149*  K 3.9  CL 111  CO2 29  GLUCOSE 116*  BUN 30*  CREATININE 1.84*  CALCIUM 8.2*   GFR: Estimated Creatinine Clearance: 23.9 mL/min (A) (by C-G formula based on SCr of 1.84 mg/dL (H)). Liver Function Tests: Recent Labs  Lab 05/22/17 1647  AST 29  ALT 15*  ALKPHOS 95  BILITOT 0.9  PROT 5.2*  ALBUMIN 2.1*   No results for input(s): LIPASE, AMYLASE in the last 168 hours. No results for input(s): AMMONIA in the last 168 hours. Coagulation Profile: Recent Labs  Lab 05/22/17 1647  INR 1.42   Cardiac Enzymes: No results for input(s): CKTOTAL, CKMB, CKMBINDEX, TROPONINI in the last 168 hours. BNP (last 3 results) No results for input(s): PROBNP in the last 8760 hours. HbA1C: No results for input(s): HGBA1C in the last 72 hours. CBG: No results for input(s): GLUCAP in the last 168 hours. Lipid Profile: No results for input(s): CHOL, HDL, LDLCALC, TRIG, CHOLHDL, LDLDIRECT in the last 72 hours. Thyroid Function Tests: No results for input(s): TSH, T4TOTAL, FREET4, T3FREE, THYROIDAB in the last 72 hours. Anemia Panel: No results for input(s): VITAMINB12, FOLATE, FERRITIN, TIBC, IRON, RETICCTPCT in the last 72 hours. Urine analysis:    Component Value Date/Time   COLORURINE YELLOW 04/15/2017 [place] 04/15/2017 2345   LABSPEC 1.014 04/15/2017 2345   PHURINE 6.0 04/15/2017 2345   GLUCOSEU NEGATIVE 04/15/2017 2345   HGBUR NEGATIVE 04/15/2017 2345   BILIRUBINUR NEGATIVE 04/15/2017 2345   BILIRUBINUR neg 09/24/2015 1504   KETONESUR 5 (A) 04/15/2017 2345   PROTEINUR 100 (A) 04/15/2017 2345   UROBILINOGEN 0.2 09/24/2015 1504   UROBILINOGEN 0.2 09/10/2014 1052   NITRITE NEGATIVE 04/15/2017 2345   LEUKOCYTESUR  NEGATIVE 04/15/2017 2345    Radiological Exams on Admission: Dg Chest Portable 1 View  Result Date: 05/22/2017 CLINICAL DATA:  Hypoxemia, increased mucus secretions, fever this morning, history atrial fibrillation, CHF, hypertension, NSTEMI, former smoker, prior stroke EXAM: PORTABLE CHEST 1 VIEW COMPARISON:  Portable exam 1727 hours compared to 04/04/2017 FINDINGS: Normal heart size and mediastinal contours. Atherosclerotic calcification aorta. Slightly decreased lung volumes versus previous exam with bibasilar opacities, favoring infiltrate/pneumonia at RIGHT base and either atelectasis  or infiltrate at LEFT base. Upper lungs clear. No pleural effusion or pneumothorax. Bones demineralized. IMPRESSION: RIGHT basilar infiltrate consistent with pneumonia. Additional infiltrate versus atelectasis at LEFT base. Electronically Signed   By: Lavonia Dana M.D.   On: 05/22/2017 17:48   04/17/2017 Echocardiogram complete ------------------------------------------------------------------- LV EF: 55% -   60%  ------------------------------------------------------------------- Indications:      CVA 69.  ------------------------------------------------------------------- History:   PMH:  Former Smoker.  Stroke.  Risk factors: Hypertension. Dyslipidemia.  ------------------------------------------------------------------- Study Conclusions  - Left ventricle: The cavity size was normal. Wall thickness was   normal. Systolic function was normal. The estimated ejection   fraction was in the range of 55% to 60%. Doppler parameters are   consistent with abnormal left ventricular relaxation (grade 1   diastolic dysfunction). Doppler parameters are consistent with   high ventricular filling pressure. - Aortic valve: Mildly calcified annulus. Normal thickness   leaflets. Valve area (VTI): 3.08 cm^2. Valve area (Vmax): 2.29   cm^2. - Left atrium: The atrium was mildly dilated. - Technically difficult  study.  EKG: Independently reviewed. Vent. rate 101 BPM PR interval * ms QRS duration 88 ms QT/QTc 343/445 ms P-R-T axes 44 -15 80 Sinus tachycardia Abnormal R-wave progression, late transition LVH with secondary repolarization abnormality.  Assessment/Plan Principal Problem:   Sepsis due to pneumonia ([place]) Admit to stepdown/inpatient. Continue supplemental oxygen. Continue bronchodilators. Continue IV fluids. Aztreonam, Levaquin and vancomycin per pharmacy. Follow-up blood culture and sensitivity.   Check sputum Gram stain, culture and sensitivity. Check strep pneumonia urinary antigen.  Active Problems:   AKI (acute kidney injury) ([place])   Chronic renal insufficiency, stage III (moderate) (HCC) Continue IV fluids. Monitor renal function and electrolytes.    BPH associated with nocturia Continue finasteride 5 mg p.o. daily.    Chronic diastolic heart failure [facility]) Not on medical therapy. Careful IV hydration. Monitor intake and output.    Essential hypertension Currently hypotensive. Not on medical treatment. Monitor blood pressure.    Moderate dementia Supportive care.    GERD (gastroesophageal reflux disease) Protonix 40 mg p.o. daily.    Glaucoma Continue Betagan drops twice a day.    Hypernatremia Half NS IV fluids. Encourage oral water intake. Follow-up sodium level.    DVT prophylaxis: Heparin SQ. Code Status: DNR/DNI. Family Communication: Son is inclined for palliative care, per Dr. Winfred Leeds. For the moment DNR full care.  Minimize blood draws. Disposition Plan: Admit for IV antibiotics for several days. Consults called:  Admission status: Inpatient/SDU.   Reubin Milan MD [facility] Pager [phone].  If 7PM-7AM, please contact night-coverage [email or web address] Password [facility]  05/22/2017, 8:28 PM   This document was prepared using Dragon voice recognition software and may contain some unintended dictation errors.

## 2017-05-22 NOTE — ED Notes (Signed)
Fluids and vanc on hold due to iv infiltrated. Pt very difficult stick. edp attempted EJ to left side with no success. RN with [place] attempting on right arm now.

## 2017-05-22 NOTE — ED Triage Notes (Signed)
Per staff, pt's bp and oxygen low and increased mucous secretion.  Reports he required suction pta.  Pt not eating and drinking.  BP 92/44, temp 98.3 oral but was 103 this morning.  RR 50 and HR 120 per facility.  Pt voided small amount x 1 and had small bm this shift.  Pt also has left shoulder rupture of rotator cuff.  cbg 95 per ems.

## 2017-05-22 NOTE — ED Notes (Signed)
Dr Ortiz at bedside 

## 2017-05-22 NOTE — ED Notes (Addendum)
Attempted blood draw for repeat lactic acid - unsuccessful- pt says he "wants to go to heaven" Christy, RN attempted blood draw- pt refused- saying, "he is ready to die" Dr Winfred Leeds made aware of pt statements and refusal.

## 2017-05-22 NOTE — Progress Notes (Signed)
Pharmacy Antibiotic Note  Lance Kemp is a 83 y.o. male admitted on 05/22/2017 with pneumonia.  Pharmacy has been consulted for Vancomycin and Cefepime dosing.  Initial doses given in ED.  Plan: Vancomycin 750mg  IV every 24 hours.  Goal trough 15-20 mcg/mL.  Cefepime 1gm IV every 24 hours. Monitor labs, micro and vitals.  Weight: 155 lb (70.3 kg)  Temp (24hrs), Avg:101.2 F (38.4 C), Min:99.6 F (37.6 C), Max:102.7 F (39.3 C)  Recent Labs  Lab 05/22/17 1647 05/22/17 1650 05/22/17 1724  WBC 9.5  --   --   CREATININE 1.84*  --   --   LATICACIDVEN  --  2.76* 3.12*    Estimated Creatinine Clearance: 23.9 mL/min (A) (by C-G formula based on SCr of 1.84 mg/dL (H)).    Allergies  Allergen Reactions  . Altace [Ramipril] Other (See Comments)    Reaction:  Unknown   . Amoxicillin Other (See Comments)    Reaction:  Unknown  Has patient had a PCN reaction causing immediate rash, facial/tongue/throat swelling, SOB or lightheadedness with hypotension: Unsure Has patient had a PCN reaction causing severe rash involving mucus membranes or skin necrosis: Unsure Has patient had a PCN reaction that required hospitalization Unsure Has patient had a PCN reaction occurring within the last 10 years: Unsure If all of the above answers are "NO", then may proceed with Cephalosporin use.  [name] Potassium] Other (See Comments)    Reaction:  Unknown   . Tramadol Other (See Comments)    Makes patient feel very drowsy/almost too strong to take    Antimicrobials this admission: Cefepime 12/4 >>  Vanc 12/4 >>   Dose adjustments this admission: n/a   Microbiology results: 12/4 BCx: pending  UCx:    Sputum:    MRSA PCR:   Thank you for allowing pharmacy to be a part of this patient's care.  Pricilla Larsson 05/22/2017 9:55 PM

## 2017-05-23 ENCOUNTER — Other Ambulatory Visit: Payer: Self-pay

## 2017-05-23 ENCOUNTER — Encounter (HOSPITAL_COMMUNITY): Payer: Self-pay | Admitting: *Deleted

## 2017-05-23 DIAGNOSIS — G9341 Metabolic encephalopathy: Secondary | ICD-10-CM

## 2017-05-23 DIAGNOSIS — Z515 Encounter for palliative care: Secondary | ICD-10-CM

## 2017-05-23 DIAGNOSIS — R6521 Severe sepsis with septic shock: Secondary | ICD-10-CM

## 2017-05-23 DIAGNOSIS — N170 Acute kidney failure with tubular necrosis: Secondary | ICD-10-CM

## 2017-05-23 DIAGNOSIS — Z7189 Other specified counseling: Secondary | ICD-10-CM

## 2017-05-23 DIAGNOSIS — N183 Chronic kidney disease, stage 3 (moderate): Secondary | ICD-10-CM

## 2017-05-23 DIAGNOSIS — E870 Hyperosmolality and hypernatremia: Secondary | ICD-10-CM

## 2017-05-23 LAB — BASIC METABOLIC PANEL
Anion gap: 7 (ref 5–15)
BUN: 36 mg/dL — ABNORMAL HIGH (ref 6–20)
CALCIUM: 7.5 mg/dL — AB (ref 8.9–10.3)
CHLORIDE: 114 mmol/L — AB (ref 101–111)
CO2: 27 mmol/L (ref 22–32)
CREATININE: 1.72 mg/dL — AB (ref 0.61–1.24)
GFR, EST AFRICAN AMERICAN: 38 mL/min — AB (ref >60)
GFR, EST NON AFRICAN AMERICAN: 33 mL/min — AB (ref >60)
Glucose, Bld: 229 mg/dL — ABNORMAL HIGH (ref 65–99)
Potassium: 3.8 mmol/L (ref 3.5–5.1)
SODIUM: 148 mmol/L — AB (ref 135–145)

## 2017-05-23 LAB — CBC WITH DIFFERENTIAL/PLATELET
BASOS ABS: 0 10*3/uL (ref 0.0–0.1)
Basophils Relative: 0 %
EOS ABS: 0 10*3/uL (ref 0.0–0.7)
Eosinophils Relative: 0 %
HCT: 30.2 % — ABNORMAL LOW (ref 39.0–52.0)
HEMOGLOBIN: 9.4 g/dL — AB (ref 13.0–17.0)
LYMPHS PCT: 10 %
Lymphs Abs: 0.9 10*3/uL (ref 0.7–4.0)
MCH: 30.2 pg (ref 26.0–34.0)
MCHC: 31.1 g/dL (ref 30.0–36.0)
MCV: 97.1 fL (ref 78.0–100.0)
Monocytes Absolute: 0.3 10*3/uL (ref 0.1–1.0)
Monocytes Relative: 3 %
NEUTROS ABS: 7.3 10*3/uL (ref 1.7–7.7)
NEUTROS PCT: 87 %
Platelets: 237 10*3/uL (ref 150–400)
RBC: 3.11 MIL/uL — ABNORMAL LOW (ref 4.22–5.81)
RDW: 14.8 % (ref 11.5–15.5)
WBC: 8.5 10*3/uL (ref 4.0–10.5)

## 2017-05-23 LAB — MRSA PCR SCREENING: MRSA BY PCR: POSITIVE — AB

## 2017-05-23 MED ORDER — MUPIROCIN 2 % EX OINT
1.0000 "application " | TOPICAL_OINTMENT | Freq: Two times a day (BID) | CUTANEOUS | Status: AC
  Administered 2017-05-23 – 2017-05-27 (×9): 1 via NASAL
  Filled 2017-05-23 (×4): qty 22

## 2017-05-23 MED ORDER — SODIUM CHLORIDE 0.45 % IV SOLN
INTRAVENOUS | Status: DC
  Administered 2017-05-23: 08:00:00 via INTRAVENOUS

## 2017-05-23 MED ORDER — SODIUM CHLORIDE 0.45 % IV SOLN: INTRAVENOUS | Status: DC

## 2017-05-23 MED ORDER — CHLORHEXIDINE GLUCONATE CLOTH 2 % EX PADS
6.0000 | MEDICATED_PAD | Freq: Every day | CUTANEOUS | Status: AC
  Administered 2017-05-23 – 2017-05-26 (×4): 6 via TOPICAL

## 2017-05-23 MED ORDER — MORPHINE SULFATE (PF) 2 MG/ML IV SOLN
0.5000 mg | Freq: Once | INTRAVENOUS | Status: AC
  Administered 2017-05-23: 0.5 mg via INTRAVENOUS
  Filled 2017-05-23: qty 1

## 2017-05-23 MED ORDER — MORPHINE SULFATE (PF) 2 MG/ML IV SOLN
1.0000 mg | INTRAVENOUS | Status: DC | PRN
  Administered 2017-05-23 – 2017-05-28 (×6): 1 mg via INTRAVENOUS
  Filled 2017-05-23 (×6): qty 1

## 2017-05-23 MED ORDER — MORPHINE SULFATE (PF) 2 MG/ML IV SOLN
0.5000 mg | INTRAVENOUS | Status: DC | PRN
  Administered 2017-05-23 (×2): 0.5 mg via INTRAVENOUS
  Filled 2017-05-23 (×3): qty 1

## 2017-05-23 MED ORDER — SODIUM CHLORIDE 0.45 % IV SOLN
INTRAVENOUS | Status: DC
  Filled 2017-05-23 (×2): qty 1000

## 2017-05-23 NOTE — Progress Notes (Signed)
Patient has positional IV catheter site to RIGHT AC that continues to beep and alarm. Patient unable to keep RIGHT arm straight to keep IV pump from alarming. Attempted x 3 times to gain new IV access without success. Patient has edema noted to BUE and upon sticking patient, weeping fluid noted.

## 2017-05-23 NOTE — Progress Notes (Signed)
Patient refused brkfst this AM, poor PO intake noted. Patient noted coughing and voice is unclear. May need SLP consult. MD notified.

## 2017-05-23 NOTE — Progress Notes (Signed)
CRITICAL VALUE ALERT  Critical Value:  Blood Cultures - Gram Positive Cocci   Date & Time Notied:  05/23/17 1850  Provider Notified: Dr.Dhungel   Orders Received/Actions taken:

## 2017-05-23 NOTE — Progress Notes (Signed)
Patient c/o pain "all over" and notable whole shaking noted with groaning. Patient's O2 SAT drop in the mid to high 80s when he shakes and c/o pain. MD notified.

## 2017-05-23 NOTE — Progress Notes (Signed)
I detailed discussion with patient's son Lance Kemp on the phone. He understands patient's severe illness and multiple hospitalizations recently. He also understands that patients condition is   Guarded. He does wish patient to be comfortable and feels that that is what patient had always wished for. Patient's son is with the Agreement that We continue current treatment and see for any improvement in the next 24 hours. If patient does not improve or declines during this time the goal would be to transition him to full comfort. If patient improves to the point of being able to discharged Safely back to SNF he will be followed by palliative care or hospice there.   Goal for now is: -Continue current antibiotics -Continue oxygen via nasal cannula (uptitrated to NRB If required and tolerated) -When necessary nebs -Low dose when necessary IV morphine for dyspnea and Pain -Nothing by mouth for risk of aspiration on to swallow eval. -Transition to comfort if symptoms deteriorate or unimproved in the next 24 hours

## 2017-05-23 NOTE — Evaluation (Signed)
Clinical/Bedside Swallow Evaluation Patient Details  Name: Lance Kemp MRN: [id number] Date of Birth: [birth year]  Today's Date: 05/23/2017 Time: SLP Start Time (ACUTE ONLY): 2376 SLP Stop Time (ACUTE ONLY): 1636 SLP Time Calculation (min) (ACUTE ONLY): 43 min  Past Medical History:  Past Medical History:  Diagnosis Date  . Atrial fibrillation ([place])   . Basal cell carcinoma 2011; 2016   Face  . Basal cell carcinoma   . BPH with obstruction/lower urinary tract symptoms   . Cervical spondylosis    with miltilevel changes leading to narrowing or compression of spinal nerve foramina.  +spinal stenosis.  Mild spondylolisthesis L7-T1.  Marland Kitchen CHF (congestive heart failure) ([place])   . Chronic back pain   . Chronic diastolic heart failure ([place])   . Chronic left shoulder pain 09/14/2013  . Chronic renal insufficiency, stage III (moderate) (HCC) 2013   CrCl in the 50s-60s  . Chronic venous insufficiency    Compression hose  . 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  No significant residual deficit except short term memory impairment  . Dementia 2016   Dr. Delice Lesch 08/2014--started aricept but pt did not tolerate.  Exelon trial started by Dr. Delice Lesch 05/2016.  Marland Kitchen Depression   . Dorsalgia   . Frequent headaches   . GERD (gastroesophageal reflux disease)   . Glaucoma   . History of falling   . Hypertension   . Muscle weakness   . Nocturia   . Normocytic anemia 2015   Secondary to CRI (iron studies ok 12/2013)  . NSTEMI (non-ST elevated myocardial infarction) ([place]) 06/2011   [place]: Troponin mildly elevated, normal EKG, normal ECHO, pt declined cardiology consult--per old PCP records.  Marland Kitchen OAB (overactive bladder)   . Osteoarthritis   . Overactive bladder   . Peripheral neuropathy 2013   Dx'd by neuro in [place] at the time the patient was hospitalized briefly for pontine CVA.  Marland Kitchen Polyneuropathy   . Spondylosis   . Stroke ([place])    tia  . Urine incontinence   . Venous insufficiency   .  Vertigo    Past Surgical History:  Past Surgical History:  Procedure Laterality Date  . CHOLECYSTECTOMY    . NM VQ LUNG SCAN ([place] [facility])  09/2014   Low risk  . TONSILECTOMY, ADENOIDECTOMY, BILATERAL MYRINGOTOMY AND TUBES    . TRANSTHORACIC ECHOCARDIOGRAM  12/18/13   Grade 1 diast dysf, o/w normal   HPI:  83 y/o male with History of paroxysmal A. Fib of basal cell carcinoma, diastolic CHF, chronic back pain, CKD stage III, history of CVA and dementia presented from SNF with Fever and shortness of breath. In the ED patient was in acute hypoxic respiratory failure Requiring nonrebreather and severe sepsis with fever, tachycardia, tachypnea, Hypotension, Elevated lactic acid, mildly elevated INR, hypernatremia and acute kidney injury. Chest x-ray showed right basilar infiltrate. Patient admitted to stepdown unit for severe sepsis with acute hypoxic respiratory failure secondary to right lobar pneumonia. BSE requested.   Assessment / Plan / Recommendation Clinical Impression  Pt known to SLP from previous admission about a month ago. He appears more debilitated with change in facial coloring and weak congested cough. Pt endorses recent difficulty swallowing and sore mouth. Oral cavity is xerostomic and tongue is coated and with fissures. Oral care completed, however difficulty to remove dried coating with toothette and toothbrush. Pt benefited from holding single ice chips along dried secretions to soften. Pt presents with signs of decreased airway protection with wet, congested cough on  secretions and ice chips. SLP only presented ice chips throughout evaluation due to suspected high risk for aspiration at this time. Recommend continued NPO with oral care and ice chips PRN when presented by RN when Pt is alert and upright. SLP will follow tomorrow. He is not yet ready for objective assessment due to current suspected high risk for aspiration across textures/consistencies. Above to RN and Pt in agreement with  plan of care.  SLP Visit Diagnosis: Dysphagia, oropharyngeal phase (R13.12)    Aspiration Risk  Moderate aspiration risk;Risk for inadequate nutrition/hydration    Diet Recommendation NPO;Alternative means - temporary;Ice chips PRN after oral care   Medication Administration: Crushed with puree(if necessary to give oral meds)    Other  Recommendations Oral Care Recommendations: Oral care QID;Oral care prior to ice chip/H20;Staff/trained caregiver to provide oral care Other Recommendations: Have oral suction available   Follow up Recommendations Skilled Nursing facility      Frequency and Duration min 2x/week  1 week       Prognosis Prognosis for Safe Diet Advancement: Fair Barriers to Reach Goals: Severity of deficits      Swallow Study   General Date of Onset: 05/22/17 HPI: 83 y/o male with History of paroxysmal A. Fib of basal cell carcinoma, diastolic CHF, chronic back pain, CKD stage III, history of CVA and dementia presented from SNF with Fever and shortness of breath. In the ED patient was in acute hypoxic respiratory failure Requiring nonrebreather and severe sepsis with fever, tachycardia, tachypnea, Hypotension, Elevated lactic acid, mildly elevated INR, hypernatremia and acute kidney injury. Chest x-ray showed right basilar infiltrate. Patient admitted to stepdown unit for severe sepsis with acute hypoxic respiratory failure secondary to right lobar pneumonia. BSE requested. Type of Study: Bedside Swallow Evaluation Previous Swallow Assessment: None on record, but here in Oct and had SLE Diet Prior to this Study: NPO Temperature Spikes Noted: No Respiratory Status: Nasal cannula History of Recent Intubation: No Behavior/Cognition: Alert;Cooperative;Pleasant mood Oral Cavity Assessment: Lesions(lingual fissures, coating, dried mucous) Oral Care Completed by SLP: Yes Oral Cavity - Dentition: Dentures, top Vision: Functional for self-feeding Self-Feeding Abilities:  Total assist Patient Positioning: Upright in bed Baseline Vocal Quality: Normal;Wet Volitional Cough: Congested Volitional Swallow: Able to elicit    Oral/Motor/Sensory Function Overall Oral Motor/Sensory Function: Generalized oral weakness Facial ROM: Within Functional Limits Facial Symmetry: Within Functional Limits Facial Strength: Within Functional Limits Facial Sensation: Within Functional Limits Lingual ROM: Within Functional Limits Lingual Symmetry: Within Functional Limits Lingual Strength: Reduced Lingual Sensation: Reduced Velum: Within Functional Limits Mandible: Within Functional Limits   Ice Chips Ice chips: Impaired Presentation: Spoon Oral Phase Impairments: Reduced lingual movement/coordination Pharyngeal Phase Impairments: Suspected delayed Swallow;Decreased hyoid-laryngeal movement;Multiple swallows;Wet Vocal Quality;Cough - Immediate;Cough - Delayed   Thin Liquid Thin Liquid: Not tested    Nectar Thick Nectar Thick Liquid: Not tested   Honey Thick Honey Thick Liquid: Not tested   Puree Puree: Not tested   Solid   Thank you,  [name], CCC-SLP [phone]    Solid: Not tested        PORTER,DABNEY 05/23/2017,4:54 PM

## 2017-05-23 NOTE — Progress Notes (Signed)
PROGRESS NOTE                                                                                                                                                                                                             Patient Demographics:    Lance Kemp, is a 83 y.o. male, DOB - 03-May-1923, IRS:[id number]  Admit date - 05/22/2017   Admitting Physician Reubin Milan, MD  Outpatient Primary MD for the patient is McGowen, [name], MD  LOS - 1  Outpatient Specialists:none  Chief Complaint  Patient presents with  . Respiratory Distress       Brief Narrative   83 y/o male with History of paroxysmal A. Fib of basal cell carcinoma, diastolic CHF, chronic back pain, CKD stage III, history of CVA and dementia presented from SNF with Fever and shortness of breath. In the ED patient was in acute hypoxic respiratory failure Requiring nonrebreather and severe sepsis with fever, tachycardia, tachypnea, Hypotension, Elevated lactic acid, mildly elevated INR, hypernatremia and acute kidney injury. Chest x-ray showed right basilar infiltrate. Patient admitted to stepdown unit for severe sepsis with acute hypoxic respiratory failure secondary to right lobar pneumonia.   Subjective:   Patient getting breathing treatments this morning.Unable to express how he is feeling. Appears in some distress.   Assessment  & Plan :    Principal Problem:  Severe sepsis ([place])   Sepsis due to pneumonia ([place]) Monitor in ICU/ Stepdown. Currently maintaining sats on 2 L via nasal cannula. Intermittent BiPAP as needed. Continue empiric IV vancomycin and cefepime. Follow cultures. Supportive care with Tylenol. Patient coughing and choking while attempting to eat. Will obtain swallow evaluation.  Active Problems: ? Aspiration pneumonia Patient coughing  while drinking water. Make NPO, SLP eval  Dementia with acute metabolic encephalopathy Seems  to be more confused than baseline. Monitor For now.  Hypotension Hold home blood pressure medications. Monitor with fluids.  GERD Cystoscopy IV PPI daily.  Glaucoma Continue home medications.  Hyponatremia Secondary to dehydration Monitor with IV fluids.  Acute kidney injury on chronic kidney disease stage III Monitor with IV fluids.  BPH Resume finasteride once blood pressure stable.   Goals of care Prognosis is guarded. Palliative care consulted.     Code Status : DO NOT RESUSCITATE  Family Communication  : None at bedside  Disposition  Plan  : Pending hospital course  Barriers For Discharge : Active symptoms  Consults  :   Palliative care  Procedures  : None  DVT Prophylaxis  :  Heparin  Lab Results  Component Value Date   PLT 237 05/23/2017    Antibiotics  :    Anti-infectives (From admission, onward)   Start     Dose/Rate Route Frequency Ordered Stop   05/23/17 2200  vancomycin (VANCOCIN) IVPB 750 mg/150 ml premix     750 mg 150 mL/hr over 60 Minutes Intravenous Every 24 hours 05/22/17 2155     05/23/17 2000  ceFEPIme (MAXIPIME) 1 g in dextrose 5 % 50 mL IVPB     1 g 100 mL/hr over 30 Minutes Intravenous Every 24 hours 05/22/17 2155     05/22/17 1715  ceFEPIme (MAXIPIME) 2 g in dextrose 5 % 50 mL IVPB     2 g 100 mL/hr over 30 Minutes Intravenous  Once 05/22/17 1711 05/22/17 1958   05/22/17 1715  vancomycin (VANCOCIN) IVPB 1000 mg/200 mL premix     1,000 mg 200 mL/hr over 60 Minutes Intravenous  Once 05/22/17 1711 05/22/17 1914        Objective:   Vitals:   05/23/17 0500 05/23/17 0600 05/23/17 0642 05/23/17 0816  BP: (!) 93/34 (!) 77/33 103/78   Pulse: 71 71 72   Resp: (!) 22 20 (!) 22   Temp:   (!) 97.5 F (36.4 C)   TempSrc:   Axillary   SpO2: 97% 94% 96% 99%  Weight:      Height:        Wt Readings from Last 3 Encounters:  05/22/17 59.9 kg (132 lb 0.9 oz)  04/16/17 61.3 kg (135 lb 1.6 oz)  04/06/17 61.2 kg (134 lb 14.4 oz)      Intake/Output Summary (Last 24 hours) at 05/23/2017 0937 Last data filed at 05/23/2017 0643 Gross per 24 hour  Intake 2218 ml  Output -  Net 2218 ml     Physical Exam  Gen.: Elderly thin built male appears fatigued and in some distress, moaning HEENT: no pallor, moist mucosa, supple neck Chest: Few scattered rhonchi, no crackles or wheeze CVS: N S1&S2, no murmurs, rubs or gallop GI: soft, NT, ND, BS+ Musculoskeletal: warm, no edema CNS: Alert and awake, poorly communicative.    Data Review:    CBC Recent Labs  Lab 05/22/17 1647 05/23/17 0404  WBC 9.5 8.5  HGB 10.6* 9.4*  HCT 34.0* 30.2*  PLT 257 237  MCV 96.9 97.1  MCH 30.2 30.2  MCHC 31.2 31.1  RDW 14.7 14.8  LYMPHSABS 0.7 0.9  MONOABS 0.4 0.3  EOSABS 0.0 0.0  BASOSABS 0.0 0.0    Chemistries  Recent Labs  Lab 05/22/17 1647 05/23/17 0404  NA 149* 148*  K 3.9 3.8  CL 111 114*  CO2 29 27  GLUCOSE 116* 229*  BUN 30* 36*  CREATININE 1.84* 1.72*  CALCIUM 8.2* 7.5*  AST 29  --   ALT 15*  --   ALKPHOS 95  --   BILITOT 0.9  --    ------------------------------------------------------------------------------------------------------------------ No results for input(s): CHOL, HDL, LDLCALC, TRIG, CHOLHDL, LDLDIRECT in the last 72 hours.  Lab Results  Component Value Date   HGBA1C 5.5 04/16/2017   ------------------------------------------------------------------------------------------------------------------ No results for input(s): TSH, T4TOTAL, T3FREE, THYROIDAB in the last 72 hours.  Invalid input(s): FREET3 ------------------------------------------------------------------------------------------------------------------ No results for input(s): VITAMINB12, FOLATE, FERRITIN, TIBC, IRON, RETICCTPCT in the last 72  hours.  Coagulation profile Recent Labs  Lab 05/22/17 1647  INR 1.42    No results for input(s): DDIMER in the last 72 hours.  Cardiac Enzymes No results for input(s): CKMB,  TROPONINI, MYOGLOBIN in the last 168 hours.  Invalid input(s): CK ------------------------------------------------------------------------------------------------------------------    Component Value Date/Time   BNP 194.0 (H) 02/11/2017 1933    Inpatient Medications  Scheduled Meds: . aspirin  81 mg Oral Daily  . Chlorhexidine Gluconate Cloth  6 each Topical Q0600  . finasteride  5 mg Oral Daily  . heparin  5,000 Units Subcutaneous Q8H  . ipratropium  0.5 mg Nebulization Q6H  . ketotifen  2 drop Both Eyes QHS  . levalbuterol  1.25 mg Nebulization Q6H  . levobunolol  1 drop Both Eyes BID  . mupirocin ointment  1 application Nasal BID  . pantoprazole  40 mg Oral Daily  . sodium chloride flush  3 mL Intravenous Q12H   Continuous Infusions: . sodium chloride 100 mL/hr at 05/23/17 0757  . sodium chloride    . ceFEPime (MAXIPIME) IV    . dextrose 75 mL/hr at 05/22/17 2217  . vancomycin     PRN Meds:.sodium chloride, acetaminophen, acetaminophen, guaiFENesin, ondansetron **OR** ondansetron (ZOFRAN) IV, polyethylene glycol, sodium chloride flush  Micro Results Recent Results (from the past 240 hour(s))  Culture, blood (Routine x 2)     Status: None (Preliminary result)   Collection Time: 05/22/17  5:26 PM  Result Value Ref Range Status   Specimen Description BLOOD RIGHT HAND  Final   Special Requests   Final    BOTTLES DRAWN AEROBIC AND ANAEROBIC Blood Culture adequate volume   Culture NO GROWTH < 24 HOURS  Final   Report Status PENDING  Incomplete  Culture, blood (Routine x 2)     Status: None (Preliminary result)   Collection Time: 05/22/17  5:26 PM  Result Value Ref Range Status   Specimen Description LEFT ANTECUBITAL  Final   Special Requests   Final    BOTTLES DRAWN AEROBIC AND ANAEROBIC Blood Culture adequate volume   Culture NO GROWTH < 24 HOURS  Final   Report Status PENDING  Incomplete  MRSA PCR Screening     Status: Abnormal   Collection Time: 05/22/17  9:40 PM    Result Value Ref Range Status   MRSA by PCR POSITIVE (A) NEGATIVE Final    Comment:        The GeneXpert MRSA Assay (FDA approved for NASAL specimens only), is one component of a comprehensive MRSA colonization surveillance program. It is not intended to diagnose MRSA infection nor to guide or monitor treatment for MRSA infections. RESULT CALLED TO, READ BACK BY AND VERIFIED WITH: MCGIBBONY C. AT 0932A ON [id number] BY THOMPSON S.     Radiology Reports Dg Chest Portable 1 View  Result Date: 05/22/2017 CLINICAL DATA:  Hypoxemia, increased mucus secretions, fever this morning, history atrial fibrillation, CHF, hypertension, NSTEMI, former smoker, prior stroke EXAM: PORTABLE CHEST 1 VIEW COMPARISON:  Portable exam 1727 hours compared to 04/04/2017 FINDINGS: Normal heart size and mediastinal contours. Atherosclerotic calcification aorta. Slightly decreased lung volumes versus previous exam with bibasilar opacities, favoring infiltrate/pneumonia at RIGHT base and either atelectasis or infiltrate at LEFT base. Upper lungs clear. No pleural effusion or pneumothorax. Bones demineralized. IMPRESSION: RIGHT basilar infiltrate consistent with pneumonia. Additional infiltrate versus atelectasis at LEFT base. Electronically Signed   By: Lavonia Dana M.D.   On: 05/22/2017 17:48    Time Spent in  minutes  35   Ladon Heney M.D on 05/23/2017 at 9:37 AM  Between 7am to 7pm - Pager - [phone]  After 7pm go to [email or web address] - password [facility]  [facility] -  Office  [phone]

## 2017-05-23 NOTE — Consult Note (Signed)
Consultation Note Date: 05/23/2017   Patient Name: Lance Kemp  DOB: 10-Apr-1923  MRN: [id number]  Age / Sex: 83 y.o., male  PCP: Lance Sou, MD Referring Physician: Louellen Molder, MD  Reason for Consultation: Establishing goals of care and Psychosocial/spiritual support  HPI/Patient Profile: 83 y.o. male  with extensive past medical history, not limited to, but including, dementia, atrial fibrillation, heart failure, stage III kidney disease, history of TIA and CVA, high blood pressure, depression, history of falling, overactive bladder, arthritis, overactive bladder with incontinence, BPH, basal cell carcinoma admitted on 05/22/2017 with sepsis due to pneumonia.   Clinical Assessment and Goals of Care: Lance Kemp is resting in bed.  He will briefly make but not keep eye contact.  He does have some work of breathing noted.  He will occasionally moan, and seems uncomfortable in the bed.  He is able to answer brief questions, but does not initiate conversation.  He states that he has pain, but is unable to tell me where.  There is no family at bedside at this time.  Conversation with nursing staff related to treatment plan.  Call to son, Lance Kemp.  Left detailed voicemail message requesting return call, family meeting in person or via phone 12/6.  Healthcare power of attorney NEXT OF KIN -son Lance Kemp is only contact listed in chart.   SUMMARY OF RECOMMENDATIONS   At this point, 24-48 hours for outcomes.  Code Status/Advance Care Planning:  DNR  Symptom Management:   Per hospitalist, no additional needs at this time.  Palliative Prophylaxis:   Aspiration, Frequent Pain Assessment and Turn Reposition  Additional Recommendations (Limitations, Scope, Preferences):  Continue to treat the treatable but no CPR, no intubation.  Psycho-social/Spiritual:   Desire for further Chaplaincy  support:no  Additional Recommendations: Caregiving  Support/Resources and Education on Hospice  Prognosis:   < 4 weeks, or less would not be surprising based on 5 hospitalizations in the last months, admit diagnosis of sepsis from pneumonia, frailty, albumin of 2.1, dementia.  Discharge Planning: To be determined, based on outcomes.      Primary Diagnoses: Present on Admission: . Sepsis due to pneumonia ([place]) . GERD (gastroesophageal reflux disease) . BPH associated with nocturia . Chronic renal insufficiency, stage III (moderate) (HCC) . Chronic diastolic heart failure ([place]) . Essential hypertension . Moderate dementia . Glaucoma . Hypernatremia . AKI (acute kidney injury) ([place])   I have reviewed the medical record, interviewed the patient and family, and examined the patient. The following aspects are pertinent.  Past Medical History:  Diagnosis Date  . Atrial fibrillation ([place])   . Basal cell carcinoma 2011; 2016   Face  . Basal cell carcinoma   . BPH with obstruction/lower urinary tract symptoms   . Cervical spondylosis    with miltilevel changes leading to narrowing or compression of spinal nerve foramina.  +spinal stenosis.  Mild spondylolisthesis L7-T1.  Marland Kitchen CHF (congestive heart failure) ([place])   . Chronic back pain   . Chronic diastolic heart failure ([place])   .  Chronic left shoulder pain 09/14/2013  . Chronic renal insufficiency, stage III (moderate) (HCC) 2013   CrCl in the 50s-60s  . Chronic venous insufficiency    Compression hose  . 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  No significant residual deficit except short term memory impairment  . Dementia 2016   Dr. Delice Lesch 08/2014--started aricept but pt did not tolerate.  Exelon trial started by Dr. Delice Lesch 05/2016.  Marland Kitchen Depression   . Dorsalgia   . Frequent headaches   . GERD (gastroesophageal reflux disease)   . Glaucoma   . History of falling   . Hypertension   . Muscle weakness   . Nocturia   .  Normocytic anemia 2015   Secondary to CRI (iron studies ok 12/2013)  . NSTEMI (non-ST elevated myocardial infarction) ([place]) 06/2011   [place]: Troponin mildly elevated, normal EKG, normal ECHO, pt declined cardiology consult--per old PCP records.  Marland Kitchen OAB (overactive bladder)   . Osteoarthritis   . Overactive bladder   . Peripheral neuropathy 2013   Dx'd by neuro in [place] at the time the patient was hospitalized briefly for pontine CVA.  Marland Kitchen Polyneuropathy   . Spondylosis   . Stroke ([place])    tia  . Urine incontinence   . Venous insufficiency   . Vertigo    Social History   Socioeconomic History  . Marital status: Single    Spouse name: None  . Number of children: 1  . Years of education: None  . Highest education level: None  Social Needs  . Financial resource strain: None  . Food insecurity - worry: None  . Food insecurity - inability: None  . Transportation needs - medical: None  . Transportation needs - non-medical: None  Occupational History  . Occupation: Retired  Tobacco Use  . Smoking status: Former Smoker    Types: Pipe  . Smokeless tobacco: Never Used  Substance and Sexual Activity  . Alcohol use: No    Alcohol/week: 0.0 oz  . Drug use: No  . Sexual activity: No  Other Topics Concern  . None  Social History Narrative   Widower.   Relocated to [place] from [place] 02/2013 to live in [place] (falls while living alone led to this).   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   Pipe smoker until 2014.   Alcohol: social drinker until his 71s, then no alcohol.      Family History  Problem Relation Age of Onset  . Cancer Mother   . Heart disease Mother    Scheduled Meds: . aspirin  81 mg Oral Daily  . Chlorhexidine Gluconate Cloth  6 each Topical Q0600  . finasteride  5 mg Oral Daily  . heparin  5,000 Units Subcutaneous Q8H  . ipratropium  0.5 mg Nebulization Q6H  . ketotifen  2 drop Both Eyes QHS  . levalbuterol  1.25 mg Nebulization Q6H    . levobunolol  1 drop Both Eyes BID  . mupirocin ointment  1 application Nasal BID  . pantoprazole  40 mg Oral Daily  . sodium chloride flush  3 mL Intravenous Q12H   Continuous Infusions: . sodium chloride    . sodium chloride    . ceFEPime (MAXIPIME) IV    . vancomycin     PRN Meds:.sodium chloride, acetaminophen, acetaminophen, guaiFENesin, ondansetron **OR** ondansetron (ZOFRAN) IV, polyethylene glycol, sodium chloride flush Medications Prior to Admission:  Prior to Admission medications   Medication Sig Start Date End Date Taking? Authorizing  Provider  acetaminophen (TYLENOL) 500 MG tablet Take 500 mg by mouth every 4 (four) hours as needed for mild pain or moderate pain.   Yes [provider]  acetaminophen (TYLENOL) 650 MG suppository Place 650 mg rectally every 6 (six) hours as needed for fever.   Yes [provider]  albuterol (PROVENTIL) (2.5 MG/3ML) 0.083% nebulizer solution Take 2.5 mg by nebulization every 4 (four) hours as needed for wheezing or shortness of breath.   Yes [provider]  aspirin 81 MG chewable tablet Chew 81 mg by mouth daily.   Yes [provider]  azelastine (OPTIVAR) 0.05 % ophthalmic solution Place 2 drops into both eyes every evening.   Yes [provider]  finasteride (PROSCAR) 5 MG tablet TAKE ONE TABLET BY MOUTH EVERY DAY 01/25/17  Yes McGowen, [name], MD  guaifenesin (GERI-TUSSIN) 100 MG/5ML syrup Take 200 mg by mouth every 4 (four) hours as needed for cough.   Yes [provider]  levobunolol (BETAGAN) 0.5 % ophthalmic solution Place 1 drop into both eyes 2 (two) times daily. 06/21/16  Yes McGowen, [name], MD  Multiple Vitamins-Minerals (ICAPS AREDS 2 PO) Take 1 capsule by mouth 2 (two) times daily.   Yes [provider]  omeprazole (PRILOSEC) 40 MG capsule Take 1 capsule (40 mg total) by mouth daily. 03/21/17  Yes McGowen, [name], MD  ondansetron (ZOFRAN) 4 MG tablet Take 1 tablet (4  mg total) by mouth 4 (four) times daily -  before meals and at bedtime. 04/06/17  Yes Short, Noah Delaine, MD  polyethylene glycol powder (GLYCOLAX/MIRALAX) powder Take 17 g by mouth 2 (two) times daily as needed. 06/21/16  Yes McGowen, [name], MD  feeding supplement, ENSURE ENLIVE, (ENSURE ENLIVE) LIQD Take 237 mLs by mouth 2 (two) times daily between meals. 04/06/17   Janece Canterbury, MD   Allergies  Allergen Reactions  . Altace [Ramipril] Other (See Comments)    Reaction:  Unknown   . Amoxicillin Other (See Comments)    Reaction:  Unknown  Has patient had a PCN reaction causing immediate rash, facial/tongue/throat swelling, SOB or lightheadedness with hypotension: Unsure Has patient had a PCN reaction causing severe rash involving mucus membranes or skin necrosis: Unsure Has patient had a PCN reaction that required hospitalization Unsure Has patient had a PCN reaction occurring within the last 10 years: Unsure If all of the above answers are "NO", then may proceed with Cephalosporin use.  [name] Potassium] Other (See Comments)    Reaction:  Unknown   . Tramadol Other (See Comments)    Makes patient feel very drowsy/almost too strong to take   Review of Systems  Unable to perform ROS: Acuity of condition    Physical Exam  Constitutional: He appears distressed.  Appears frail, acutely/chronically ill.  HENT:  Head: Atraumatic.  Some temporal wasting  Cardiovascular: Normal rate.  Pulmonary/Chest: No respiratory distress.  Some work of breathing noted  Abdominal: Soft. He exhibits no distension.  Neurological: He is alert.  Able to tell me his name, but waves his hand when I ask where we are  Skin: Skin is warm and dry.  Nursing note and vitals reviewed.   Vital Signs: BP 103/78   Pulse 72   Temp (!) 97.5 F (36.4 C) (Axillary)   Resp (!) 22   Ht 5\' 10"  (1.778 m) Comment: unsure  Wt 59.9 kg (132 lb 0.9 oz)   SpO2 99%   BMI 18.95 kg/m  Pain  Assessment:  PAINAD   Pain Score: Asleep   SpO2: SpO2: 99 % O2 Device:SpO2: 99 % O2 Flow Rate: .O2 Flow Rate (L/min): 3 L/min  IO: Intake/output summary:   Intake/Output Summary (Last 24 hours) at 05/23/2017 1357 Last data filed at 05/23/2017 9604 Gross per 24 hour  Intake 2218 ml  Output -  Net 2218 ml    LBM:   Baseline Weight: Weight: 70.3 kg (155 lb) Most recent weight: Weight: 59.9 kg (132 lb 0.9 oz)     Palliative Assessment/Data:   Flowsheet Rows     Most Recent Value  Intake Tab  Referral Department  Hospitalist  Unit at Time of Referral  ICU  Palliative Care Primary Diagnosis  Pulmonary  Date Notified  05/22/17  Palliative Care Type  New Palliative care  Reason for referral  Clarify Goals of Care, Psychosocial or Spiritual support  Date of Admission  05/22/17  Date first seen by Palliative Care  05/23/17  # of days Palliative referral response time  1 Day(s)  # of days IP prior to Palliative referral  0  Clinical Assessment  Palliative Performance Scale Score  30%  Pain Max last 24 hours  Not able to report  Pain Min Last 24 hours  Not able to report  Dyspnea Max Last 24 Hours  Not able to report  Dyspnea Min Last 24 hours  Not able to report  Psychosocial & Spiritual Assessment  Palliative Care Outcomes  Patient/Family meeting held?  Yes  Who was at the meeting?  Patient at bedside, call to son, left voicemail.  Patient/Family wishes: Interventions discontinued/not started   Mechanical Ventilation      Time In: 1035 Time Out: 1125 Time Total: 50 minutes Greater than 50%  of this time was spent counseling and coordinating care related to the above assessment and plan.  Signed by: Drue Novel, NP   Please contact Palliative Medicine Team phone at [phone] for questions and concerns.  For individual provider: See Shea Evans

## 2017-05-24 DIAGNOSIS — R7881 Bacteremia: Secondary | ICD-10-CM | POA: Diagnosis present

## 2017-05-24 DIAGNOSIS — J189 Pneumonia, unspecified organism: Secondary | ICD-10-CM

## 2017-05-24 DIAGNOSIS — B9562 Methicillin resistant Staphylococcus aureus infection as the cause of diseases classified elsewhere: Secondary | ICD-10-CM | POA: Diagnosis present

## 2017-05-24 DIAGNOSIS — J9621 Acute and chronic respiratory failure with hypoxia: Secondary | ICD-10-CM | POA: Diagnosis present

## 2017-05-24 DIAGNOSIS — A419 Sepsis, unspecified organism: Secondary | ICD-10-CM

## 2017-05-24 LAB — BASIC METABOLIC PANEL
ANION GAP: 6 (ref 5–15)
BUN: 42 mg/dL — ABNORMAL HIGH (ref 6–20)
CHLORIDE: 114 mmol/L — AB (ref 101–111)
CO2: 27 mmol/L (ref 22–32)
CREATININE: 1.34 mg/dL — AB (ref 0.61–1.24)
Calcium: 8.1 mg/dL — ABNORMAL LOW (ref 8.9–10.3)
GFR calc non Af Amer: 44 mL/min — ABNORMAL LOW (ref >60)
GFR, EST AFRICAN AMERICAN: 51 mL/min — AB (ref >60)
Glucose, Bld: 141 mg/dL — ABNORMAL HIGH (ref 65–99)
POTASSIUM: 4.1 mmol/L (ref 3.5–5.1)
SODIUM: 147 mmol/L — AB (ref 135–145)

## 2017-05-24 LAB — BLOOD CULTURE ID PANEL (REFLEXED)
ACINETOBACTER BAUMANNII: NOT DETECTED
CANDIDA KRUSEI: NOT DETECTED
Candida albicans: NOT DETECTED
Candida glabrata: NOT DETECTED
Candida parapsilosis: NOT DETECTED
Candida tropicalis: NOT DETECTED
ENTEROBACTER CLOACAE COMPLEX: NOT DETECTED
ENTEROBACTERIACEAE SPECIES: NOT DETECTED
ENTEROCOCCUS SPECIES: NOT DETECTED
ESCHERICHIA COLI: NOT DETECTED
Haemophilus influenzae: NOT DETECTED
Klebsiella oxytoca: NOT DETECTED
Klebsiella pneumoniae: NOT DETECTED
LISTERIA MONOCYTOGENES: NOT DETECTED
Methicillin resistance: DETECTED — AB
Neisseria meningitidis: NOT DETECTED
PSEUDOMONAS AERUGINOSA: NOT DETECTED
Proteus species: NOT DETECTED
SERRATIA MARCESCENS: NOT DETECTED
STAPHYLOCOCCUS AUREUS BCID: DETECTED — AB
STREPTOCOCCUS AGALACTIAE: NOT DETECTED
STREPTOCOCCUS PNEUMONIAE: NOT DETECTED
Staphylococcus species: DETECTED — AB
Streptococcus pyogenes: NOT DETECTED
Streptococcus species: NOT DETECTED

## 2017-05-24 LAB — CBC WITH DIFFERENTIAL/PLATELET
BASOS PCT: 0 %
Basophils Absolute: 0 10*3/uL (ref 0.0–0.1)
EOS ABS: 0 10*3/uL (ref 0.0–0.7)
EOS PCT: 0 %
HCT: 30.1 % — ABNORMAL LOW (ref 39.0–52.0)
HEMOGLOBIN: 9.7 g/dL — AB (ref 13.0–17.0)
Lymphocytes Relative: 7 %
Lymphs Abs: 0.8 10*3/uL (ref 0.7–4.0)
MCH: 30.6 pg (ref 26.0–34.0)
MCHC: 32.2 g/dL (ref 30.0–36.0)
MCV: 95 fL (ref 78.0–100.0)
MONOS PCT: 3 %
Monocytes Absolute: 0.4 10*3/uL (ref 0.1–1.0)
NEUTROS PCT: 90 %
Neutro Abs: 10.4 10*3/uL — ABNORMAL HIGH (ref 1.7–7.7)
Platelets: 181 10*3/uL (ref 150–400)
RBC: 3.17 MIL/uL — ABNORMAL LOW (ref 4.22–5.81)
RDW: 14.4 % (ref 11.5–15.5)
WBC: 11.7 10*3/uL — ABNORMAL HIGH (ref 4.0–10.5)

## 2017-05-24 LAB — URINALYSIS, ROUTINE W REFLEX MICROSCOPIC
BILIRUBIN URINE: NEGATIVE
GLUCOSE, UA: NEGATIVE mg/dL
HGB URINE DIPSTICK: NEGATIVE
Ketones, ur: NEGATIVE mg/dL
Leukocytes, UA: NEGATIVE
Nitrite: NEGATIVE
PH: 5 (ref 5.0–8.0)
PROTEIN: NEGATIVE mg/dL
SPECIFIC GRAVITY, URINE: 1.024 (ref 1.005–1.030)

## 2017-05-24 LAB — STREP PNEUMONIAE URINARY ANTIGEN: Strep Pneumo Urinary Antigen: NEGATIVE

## 2017-05-24 NOTE — Progress Notes (Signed)
Daily Progress Note   Patient Name: Lance Kemp       Date: 05/24/2017 DOB: Feb 13, 1923  Age: 83 y.o. MRN#: [id number] Attending Physician: Lance Molder, MD Primary Care Physician: Lance Sou, MD Admit Date: 05/22/2017  Reason for Consultation/Follow-up: Establishing goals of care and Psychosocial/spiritual support  Subjective: Lance Kemp is resting quietly in bed.  He will briefly open his eyes when I call his name, but not answer my questions, or try to communicate in any meaningful way.  He appears relatively comfortable, but acutely/chronically ill.  He gently shakes his head no when I ask if he would like something to drink, although his lips appear dry.  There is no family at bedside at this time.  Call to son, Lance Kemp.  Gentle voicemail message left relating to new discovery of positive blood cultures.  I share my worry about treatment plan of weeks of IV antibiotics. Second call to son Lance Kemp, left voicemail message.  Length of Stay: 2  Current Medications: Scheduled Meds:  . aspirin  81 mg Oral Daily  . Chlorhexidine Gluconate Cloth  6 each Topical Q0600  . finasteride  5 mg Oral Daily  . heparin  5,000 Units Subcutaneous Q8H  . ipratropium  0.5 mg Nebulization Q6H  . ketotifen  2 drop Both Eyes QHS  . levalbuterol  1.25 mg Nebulization Q6H  . levobunolol  1 drop Both Eyes BID  . mupirocin ointment  1 application Nasal BID  . pantoprazole  40 mg Oral Daily  . sodium chloride flush  3 mL Intravenous Q12H    Continuous Infusions: . sodium chloride    . ceFEPime (MAXIPIME) IV Stopped (05/23/17 2217)  . vancomycin Stopped (05/24/17 0049)    PRN Meds: sodium chloride, acetaminophen, acetaminophen, guaiFENesin, morphine injection, ondansetron **OR**  ondansetron (ZOFRAN) IV, polyethylene glycol, sodium chloride flush  Physical Exam  Constitutional: No distress.  Appears acutely/chronically ill.  Frail  HENT:  Head: Atraumatic.  Temporal wasting  Cardiovascular: Normal rate.  Pulmonary/Chest: Effort normal. No respiratory distress.  Abdominal: Soft. He exhibits no distension.  Musculoskeletal: He exhibits no edema.  Neurological:  Sleepy, opens eyes but does not make eye contact, does not answer my questions today  Skin: Skin is warm and dry.  Nursing note and vitals reviewed.  Vital Signs: BP (!) 133/52   Pulse 74   Temp 97.6 F (36.4 C) (Axillary)   Resp (!) 29   Ht 5\' 10"  (1.778 m) Comment: unsure  Wt 59.9 kg (132 lb 0.9 oz)   SpO2 95%   BMI 18.95 kg/m  SpO2: SpO2: 95 % O2 Device: O2 Device: Nasal Cannula O2 Flow Rate: O2 Flow Rate (L/min): 3 L/min  Intake/output summary:   Intake/Output Summary (Last 24 hours) at 05/24/2017 1221 Last data filed at 05/24/2017 0300 Gross per 24 hour  Intake 203 ml  Output 200 ml  Net 3 ml   LBM:   Baseline Weight: Weight: 70.3 kg (155 lb) Most recent weight: Weight: 59.9 kg (132 lb 0.9 oz)       Palliative Assessment/Data:    Flowsheet Rows     Most Recent Value  Intake Tab  Referral Department  Hospitalist  Unit at Time of Referral  ICU  Palliative Care Primary Diagnosis  Pulmonary  Date Notified  05/22/17  Palliative Care Type  New Palliative care  Reason for referral  Clarify Goals of Care, Psychosocial or Spiritual support  Date of Admission  05/22/17  Date first seen by Palliative Care  05/23/17  # of days Palliative referral response time  1 Day(s)  # of days IP prior to Palliative referral  0  Clinical Assessment  Palliative Performance Scale Score  30%  Pain Max last 24 hours  Not able to report  Pain Min Last 24 hours  Not able to report  Dyspnea Max Last 24 Hours  Not able to report  Dyspnea Min Last 24 hours  Not able to report  Psychosocial  & Spiritual Assessment  Palliative Care Outcomes  Patient/Family meeting held?  Yes  Who was at the meeting?  Patient at bedside, call to son, left voicemail.  Patient/Family wishes: Interventions discontinued/not started   Mechanical Ventilation      Patient Active Problem List   Diagnosis Date Noted  . Acute on chronic respiratory failure with hypoxia ([place]) 05/24/2017  . MRSA bacteremia 05/24/2017  . Sepsis due to pneumonia ([place]) 05/24/2017  . Palliative care encounter   . Goals of care, counseling/discussion   . GERD (gastroesophageal reflux disease) 05/22/2017  . Paroxysmal atrial fibrillation ([place]) 05/22/2017  . Glaucoma 05/22/2017  . Hypernatremia 05/22/2017  . Lactic acidosis 05/22/2017  . AKI (acute kidney injury) ([place]) 05/22/2017  . Acute CVA (cerebrovascular accident) ([place]) 04/16/2017  . Falls frequently 04/16/2017  . Generalized weakness 04/05/2017  . Elevated troponin 02/11/2017  . Vertigo 02/04/2017  . Basal cell carcinoma, face 04/07/2015  . Moderate dementia 11/24/2014  . OAB (overactive bladder) 10/17/2014  . Essential hypertension 08/25/2014  . Hyperlipidemia 08/25/2014  . Peripheral edema 04/16/2014  . Chronic diastolic heart failure ([place]) 03/04/2014  . Chronic renal insufficiency, stage III (moderate) (HCC)   . Chronic shoulder pain 02/19/2014  . SOB (shortness of breath) 02/18/2014  . Lumbago 12/18/2013  . Acute left hemiparesis ([place]) 11/21/2013  . BPH associated with nocturia 11/21/2013  . Short-term memory loss 10/24/2013  . Chronic left shoulder pain 09/14/2013  . Venous insufficiency of both lower extremities 09/14/2013  . HTN (hypertension), benign 09/14/2013  . Normocytic anemia 06/19/2013    Palliative Care Assessment & Plan   Patient Profile: 83 y.o. male  with extensive past medical history, not limited to, but including, dementia, atrial fibrillation, heart failure, stage III kidney disease, history of TIA and CVA, high blood pressure,  depression, history of  falling, overactive bladder, arthritis, overactive bladder with incontinence, BPH, basal cell carcinoma admitted on 05/22/2017 with sepsis due to pneumonia.   Assessment: sepsis due to pneumonia: Treated with IV antibiotics Maxipime and vancomycin, now positive blood cultures with Gram Positive Cocci.  Able to maintain his respiratory status.  Recommendations/Plan:  At this point, likely 24-48 hours more for outcomes.    Conversation with hospitalist, pharmacy, PMT, medical team recommends comfort and dignity.  Goals of Care and Additional Recommendations:  Limitations on Scope of Treatment: At this point, continue to treat the treatable but no CPR, no intubation.  Code Status:    Code Status Orders  (From admission, onward)        Start     Ordered   05/22/17 2140  Do not attempt resuscitation (DNR)  Continuous    Question Answer Comment  In the event of cardiac or respiratory ARREST Do not call a "code blue"   In the event of cardiac or respiratory ARREST Do not perform Intubation, CPR, defibrillation or ACLS   In the event of cardiac or respiratory ARREST Use medication by any route, position, wound care, and other measures to relive pain and suffering. May use oxygen, suction and manual treatment of airway obstruction as needed for comfort.      05/22/17 2139    Code Status History    Date Active Date Inactive Code Status Order ID Comments User Context   05/22/2017 17:46 05/22/2017 21:39 DNR [id number]  Orlie Dakin, MD ED   04/16/2017 11:24 04/19/2017 20:10 DNR [id number]  Murlean Iba, MD Inpatient   04/16/2017 08:47 04/16/2017 11:24 DNR [id number]  Murlean Iba, MD ED   04/05/2017 05:21 04/06/2017 20:47 DNR [id number]  Oswald Hillock, MD Inpatient   02/11/2017 22:18 02/13/2017 00:39 DNR [id number]  Reubin Milan, MD ED   04/25/2016 22:18 04/27/2016 19:19 DNR [id number]  Rise Patience, MD ED   02/18/2014 17:59 02/20/2014 19:26 Full Code  [id number]  Reyne Dumas, MD ED   12/17/2013 22:06 12/20/2013 17:20 DNR [id number]  Berle Mull, MD ED       Prognosis:   < 4 weeks or less would not be surprising based on 5 hospitalizations in the last months, admit diagnosis of sepsis from pneumonia, frailty, albumin of 2.1, dementia, now complicated by positive blood cultures.  Discharge Planning:  To be determined, and hospital death would not be surprising versus comfort care at a local hospice home.  Care plan was discussed with nursing staff, case management, social worker, and Dr. Clementeen Graham.  Thank you for allowing the Palliative Medicine Team to assist in the care of this patient.   Time In: 1500 Time Out: 1535 Total Time 35 minutes Prolonged Time Billed  no       Greater than 50%  of this time was spent counseling and coordinating care related to the above assessment and plan.  Drue Novel, NP  Please contact Palliative Medicine Team phone at [phone] for questions and concerns.

## 2017-05-24 NOTE — Progress Notes (Signed)
PROGRESS NOTE                                                                                                                                                                                                             Patient Demographics:    Lance Kemp, is a 82 y.o. male, DOB - August 03, 1922, RUE:[id number]  Admit date - 05/22/2017   Admitting Physician Reubin Milan, MD  Outpatient Primary MD for the patient is McGowen, [name], MD  LOS - 2  Outpatient Specialists:none  Chief Complaint  Patient presents with  . Respiratory Distress       Brief Narrative   83 y/o male with History of paroxysmal A. Fib of basal cell carcinoma, diastolic CHF, chronic back pain, CKD stage III, history of CVA and dementia presented from SNF with Fever and shortness of breath. In the ED patient was in acute hypoxic respiratory failure Requiring nonrebreather and severe sepsis with fever, tachycardia, tachypnea, Hypotension, Elevated lactic acid, mildly elevated INR, hypernatremia and acute kidney injury. Chest x-ray showed right basilar infiltrate. Patient admitted to stepdown unit for severe sepsis with acute hypoxic respiratory failure secondary to right lobar pneumonia. Blood culture admission growing MRSA bacteremia.   Subjective:   Still on 3 L O2 via nasal cannula, frequently moaning with symptoms improved with low-dose when necessary IV morphine. Patient appears slightly better oriented today.   Assessment  & Plan :    Principal Problem:  Severe sepsis ([place])   Sepsis due to pneumonia [facility]) Continue ICU monitoring.Blood culture growing MRSA. Continue IV vancomycin and cefepime. Patient is not a candidate for any further invasive procedure like TEE. Continue O2 via nasal cannula and nebs as tolerated. Overall prognosis is still guarded and eventually will need residual hospice.  Active Problems: ? Aspiration pneumonia Swallow  evaluation pending.  Dementia with acute metabolic encephalopathy Better oriented from yesterday.  Hypotension Home blood pressure medications held. Blood pressure low normal. fluid discontinued due to congestion on exam.  GERD PPI daily.  Glaucoma Continue home medications.  Hyponatremia Secondary to dehydration Received IV fluids Initially, discontinued with congestion on exam.  Acute kidney injury on chronic kidney disease stage III Secondary to sepsis and dehydration. Improved in a.m. Labs.  BPH Resume finasteride once blood pressure stable.   Goals of care Prognosis is guarded. Palliative care consulted.  Code Status : DO NOT RESUSCITATE  Family Communication  : Discussed in detail with patient's son on the phone.  Disposition Plan  : Likely residential hospice  Barriers For Discharge : Active symptoms  Goals of care Symptoms minimally improved from yesterday. Now has MRSA bacteremia. Overalll prognosis is extremely guarded with advanced age, Sepsis and overall Debility. Discussed at length with son on the phone on 12/5 . He  agrees with  transition to comfort measures if no significant improvement today or clinical deterioration.  Consults  :   Palliative care  Procedures  : None  DVT Prophylaxis  :  Heparin  Lab Results  Component Value Date   PLT 181 05/24/2017    Antibiotics  :    Anti-infectives (From admission, onward)   Start     Dose/Rate Route Frequency Ordered Stop   05/23/17 2200  vancomycin (VANCOCIN) IVPB 750 mg/150 ml premix     750 mg 150 mL/hr over 60 Minutes Intravenous Every 24 hours 05/22/17 2155     05/23/17 2000  ceFEPIme (MAXIPIME) 1 g in dextrose 5 % 50 mL IVPB     1 g 100 mL/hr over 30 Minutes Intravenous Every 24 hours 05/22/17 2155     05/22/17 1715  ceFEPIme (MAXIPIME) 2 g in dextrose 5 % 50 mL IVPB     2 g 100 mL/hr over 30 Minutes Intravenous  Once 05/22/17 1711 05/22/17 1958   05/22/17 1715  vancomycin (VANCOCIN)  IVPB 1000 mg/200 mL premix     1,000 mg 200 mL/hr over 60 Minutes Intravenous  Once 05/22/17 1711 05/22/17 1914        Objective:   Vitals:   05/24/17 1000 05/24/17 1100 05/24/17 1200 05/24/17 1300  BP: (!) 115/41 99/79 (!) 116/50 (!) 86/68  Pulse: 69 (!) 59 72 62  Resp: (!) 22 (!) 21 (!) 22 14  Temp:   (!) 97.3 F (36.3 C)   TempSrc:   Axillary   SpO2: 97% 98% 96% 97%  Weight:      Height:        Wt Readings from Last 3 Encounters:  05/22/17 59.9 kg (132 lb 0.9 oz)  04/16/17 61.3 kg (135 lb 1.6 oz)  04/06/17 61.2 kg (134 lb 14.4 oz)     Intake/Output Summary (Last 24 hours) at 05/24/2017 1346 Last data filed at 05/24/2017 0300 Gross per 24 hour  Intake 203 ml  Output 200 ml  Net 3 ml     Physical Exam Gen.: Elderly male appears fatigued HEENT: Moist dose, supple neck Chest: Scattered rhonchi. Few basilar crackles bilaterally CVS: Normal S1 and S2, no murmurs GI: Soft, nondistended, nontender Muscular skills: Warm, no edema  CNS: Alert and awake, oriented to place    Data Review:    CBC Recent Labs  Lab 05/22/17 1647 05/23/17 0404 05/24/17 0536  WBC 9.5 8.5 11.7*  HGB 10.6* 9.4* 9.7*  HCT 34.0* 30.2* 30.1*  PLT 257 237 181  MCV 96.9 97.1 95.0  MCH 30.2 30.2 30.6  MCHC 31.2 31.1 32.2  RDW 14.7 14.8 14.4  LYMPHSABS 0.7 0.9 0.8  MONOABS 0.4 0.3 0.4  EOSABS 0.0 0.0 0.0  BASOSABS 0.0 0.0 0.0    Chemistries  Recent Labs  Lab 05/22/17 1647 05/23/17 0404 05/24/17 0536  NA 149* 148* 147*  K 3.9 3.8 4.1  CL 111 114* 114*  CO2 29 27 27   GLUCOSE 116* 229* 141*  BUN 30* 36* 42*  CREATININE 1.84* 1.72* 1.34*  CALCIUM 8.2* 7.5* 8.1*  AST 29  --   --   ALT 15*  --   --   ALKPHOS 95  --   --   BILITOT 0.9  --   --    ------------------------------------------------------------------------------------------------------------------ No results for input(s): CHOL, HDL, LDLCALC, TRIG, CHOLHDL, LDLDIRECT in the last 72 hours.  Lab Results  Component  Value Date   HGBA1C 5.5 04/16/2017   ------------------------------------------------------------------------------------------------------------------ No results for input(s): TSH, T4TOTAL, T3FREE, THYROIDAB in the last 72 hours.  Invalid input(s): FREET3 ------------------------------------------------------------------------------------------------------------------ No results for input(s): VITAMINB12, FOLATE, FERRITIN, TIBC, IRON, RETICCTPCT in the last 72 hours.  Coagulation profile Recent Labs  Lab 05/22/17 1647  INR 1.42    No results for input(s): DDIMER in the last 72 hours.  Cardiac Enzymes No results for input(s): CKMB, TROPONINI, MYOGLOBIN in the last 168 hours.  Invalid input(s): CK ------------------------------------------------------------------------------------------------------------------    Component Value Date/Time   BNP 194.0 (H) 02/11/2017 1933    Inpatient Medications  Scheduled Meds: . aspirin  81 mg Oral Daily  . Chlorhexidine Gluconate Cloth  6 each Topical Q0600  . finasteride  5 mg Oral Daily  . heparin  5,000 Units Subcutaneous Q8H  . ipratropium  0.5 mg Nebulization Q6H  . ketotifen  2 drop Both Eyes QHS  . levalbuterol  1.25 mg Nebulization Q6H  . levobunolol  1 drop Both Eyes BID  . mupirocin ointment  1 application Nasal BID  . pantoprazole  40 mg Oral Daily  . sodium chloride flush  3 mL Intravenous Q12H   Continuous Infusions: . sodium chloride    . ceFEPime (MAXIPIME) IV Stopped (05/23/17 2217)  . vancomycin Stopped (05/24/17 0049)   PRN Meds:.sodium chloride, acetaminophen, acetaminophen, guaiFENesin, morphine injection, ondansetron **OR** ondansetron (ZOFRAN) IV, polyethylene glycol, sodium chloride flush  Micro Results Recent Results (from the past 240 hour(s))  Culture, blood (Routine x 2)     Status: None (Preliminary result)   Collection Time: 05/22/17  5:26 PM  Result Value Ref Range Status   Specimen Description  BLOOD RIGHT HAND  Final   Special Requests   Final    BOTTLES DRAWN AEROBIC AND ANAEROBIC Blood Culture adequate volume   Culture NO GROWTH 2 DAYS  Final   Report Status PENDING  Incomplete  Culture, blood (Routine x 2)     Status: None (Preliminary result)   Collection Time: 05/22/17  5:26 PM  Result Value Ref Range Status   Specimen Description LEFT ANTECUBITAL  Final   Special Requests   Final    BOTTLES DRAWN AEROBIC AND ANAEROBIC Blood Culture adequate volume   Culture  Setup Time   Final    GRAM POSITIVE COCCI Gram Stain Report Called to,Read Back By and Verified With: CAPMPOS,E AT [place] ON 12.5.2018 BY ISLEY,B Performed at [place] BY AND VERIFIED WITH: Clarene Essex RN [phone] 05/24/17 A BROWNING Performed at [facility], [place] [address] [place], [place] [address]    Culture GRAM POSITIVE COCCI  Final   Report Status PENDING  Incomplete  Blood Culture ID Panel (Reflexed)     Status: Abnormal   Collection Time: 05/22/17  5:26 PM  Result Value Ref Range Status   Enterococcus species NOT DETECTED NOT DETECTED Final   Listeria monocytogenes NOT DETECTED NOT DETECTED Final   Staphylococcus species DETECTED (A) NOT DETECTED Final    Comment: CRITICAL RESULT CALLED TO, READ BACK BY AND VERIFIED WITH: J DANIELS  RN [phone] 05/24/17 A BROWNING    Staphylococcus aureus DETECTED (A) NOT DETECTED Final    Comment: Methicillin (oxacillin)-resistant Staphylococcus aureus (MRSA). MRSA is predictably resistant to beta-lactam antibiotics (except ceftaroline). Preferred therapy is vancomycin unless clinically contraindicated. Patient requires contact precautions if  hospitalized. CRITICAL RESULT CALLED TO, READ BACK BY AND VERIFIED WITH: Clarene Essex RN [phone] 05/24/17 A BROWNING    Methicillin resistance DETECTED (A) NOT DETECTED Final    Comment: CRITICAL RESULT CALLED TO, READ BACK BY AND VERIFIED WITH: Clarene Essex RN [phone] 05/24/17 A BROWNING     Streptococcus species NOT DETECTED NOT DETECTED Final   Streptococcus agalactiae NOT DETECTED NOT DETECTED Final   Streptococcus pneumoniae NOT DETECTED NOT DETECTED Final   Streptococcus pyogenes NOT DETECTED NOT DETECTED Final   Acinetobacter baumannii NOT DETECTED NOT DETECTED Final   Enterobacteriaceae species NOT DETECTED NOT DETECTED Final   Enterobacter cloacae complex NOT DETECTED NOT DETECTED Final   Escherichia coli NOT DETECTED NOT DETECTED Final   Klebsiella oxytoca NOT DETECTED NOT DETECTED Final   Klebsiella pneumoniae NOT DETECTED NOT DETECTED Final   Proteus species NOT DETECTED NOT DETECTED Final   Serratia marcescens NOT DETECTED NOT DETECTED Final   Haemophilus influenzae NOT DETECTED NOT DETECTED Final   Neisseria meningitidis NOT DETECTED NOT DETECTED Final   Pseudomonas aeruginosa NOT DETECTED NOT DETECTED Final   Candida albicans NOT DETECTED NOT DETECTED Final   Candida glabrata NOT DETECTED NOT DETECTED Final   Candida krusei NOT DETECTED NOT DETECTED Final   Candida parapsilosis NOT DETECTED NOT DETECTED Final   Candida tropicalis NOT DETECTED NOT DETECTED Final    Comment: Performed at [facility], [place]. [address], [place], [place] [address]  MRSA PCR Screening     Status: Abnormal   Collection Time: 05/22/17  9:40 PM  Result Value Ref Range Status   MRSA by PCR POSITIVE (A) NEGATIVE Final    Comment:        The GeneXpert MRSA Assay (FDA approved for NASAL specimens only), is one component of a comprehensive MRSA colonization surveillance program. It is not intended to diagnose MRSA infection nor to guide or monitor treatment for MRSA infections. RESULT CALLED TO, READ BACK BY AND VERIFIED WITH: MCGIBBONY C. AT 0932A ON [id number] BY THOMPSON S.     Radiology Reports Dg Chest Portable 1 View  Result Date: 05/22/2017 CLINICAL DATA:  Hypoxemia, increased mucus secretions, fever this morning, history atrial fibrillation, CHF, hypertension, NSTEMI,  former smoker, prior stroke EXAM: PORTABLE CHEST 1 VIEW COMPARISON:  Portable exam 1727 hours compared to 04/04/2017 FINDINGS: Normal heart size and mediastinal contours. Atherosclerotic calcification aorta. Slightly decreased lung volumes versus previous exam with bibasilar opacities, favoring infiltrate/pneumonia at RIGHT base and either atelectasis or infiltrate at LEFT base. Upper lungs clear. No pleural effusion or pneumothorax. Bones demineralized. IMPRESSION: RIGHT basilar infiltrate consistent with pneumonia. Additional infiltrate versus atelectasis at LEFT base. Electronically Signed   By: Lavonia Dana M.D.   On: 05/22/2017 17:48    Time Spent in minutes  35   Aleia Larocca M.D on 05/24/2017 at 1:46 PM  Between 7am to 7pm - Pager - [phone]  After 7pm go to [email or web address] - password [facility]  [facility] -  Office  [phone]

## 2017-05-24 NOTE — Plan of Care (Signed)
Pt's VS WNL at this time. Will continue to monitor

## 2017-05-24 NOTE — Progress Notes (Signed)
  Speech Language Pathology Treatment: Dysphagia  Patient Details Name: Lance Kemp MRN: [id number] DOB: 1922-12-02 Today's Date: 05/24/2017 Time: 1825-1900 SLP Time Calculation (min) (ACUTE ONLY): 35 min  Assessment / Plan / Recommendation Clinical Impression  Pt seen at bedside for ongoing diagnostic dysphagia intervention. Pt alert, but intermittently confused (asking where I hid the candy, repeatedly asking for location, distressed about television looking like a cartoon). Oral cavity appears improved today. Pt with mild wet vocal quality and audible mucous prior to po trials. Pt accepted ice chips, sips of water, and small bite of puree without immediate/overt coughing. He does display occasional productive delayed cough. O2 sats remained at 100% throughout trials. Pt is at high risk for aspiration due to generalized weakness and compromised respiratory status. He needed frequent encouragement to take anything beyond small sips of water by mouth. Would suggest consideration of offering comfort feeds (D1/puree and thin liquids) with accepted risks of aspiration depending on goals of care vs continue NPO with allowance for free water/ice [name]) after oral care. Pt not yet appropriate for MBSS. Discussed with RN. SLP will follow during acute stay.   HPI HPI: 83 y/o male with History of paroxysmal A. Fib of basal cell carcinoma, diastolic CHF, chronic back pain, CKD stage III, history of CVA and dementia presented from SNF with Fever and shortness of breath. In the ED patient was in acute hypoxic respiratory failure Requiring nonrebreather and severe sepsis with fever, tachycardia, tachypnea, Hypotension, Elevated lactic acid, mildly elevated INR, hypernatremia and acute kidney injury. Chest x-ray showed right basilar infiltrate. Patient admitted to stepdown unit for severe sepsis with acute hypoxic respiratory failure secondary to right lobar pneumonia. BSE requested.      SLP  Plan  Continue with current plan of care       Recommendations  Diet recommendations: NPO;Dysphagia 1 (puree);Thin liquid Liquids provided via: Cup;No straw Medication Administration: Crushed with puree Supervision: Staff to assist with self feeding;Full supervision/cueing for compensatory strategies Compensations: Slow rate;Small sips/bites;Multiple dry swallows after each bite/sip;Clear throat intermittently Postural Changes and/or Swallow Maneuvers: Seated upright 90 degrees;Upright 30-60 min after meal                Oral Care Recommendations: Oral care QID;Oral care prior to ice chip/H20;Staff/trained caregiver to provide oral care Follow up Recommendations: Skilled Nursing facility SLP Visit Diagnosis: Dysphagia, oropharyngeal phase (R13.12) Plan: Continue with current plan of care       Thank you,  [name], [name]                 [name] 05/24/2017, 7:00 PM

## 2017-05-25 LAB — CBC WITH DIFFERENTIAL/PLATELET
Basophils Absolute: 0 10*3/uL (ref 0.0–0.1)
Basophils Relative: 0 %
Eosinophils Absolute: 0 10*3/uL (ref 0.0–0.7)
Eosinophils Relative: 0 %
HCT: 31.3 % — ABNORMAL LOW (ref 39.0–52.0)
Hemoglobin: 10.4 g/dL — ABNORMAL LOW (ref 13.0–17.0)
LYMPHS ABS: 0.6 10*3/uL — AB (ref 0.7–4.0)
Lymphocytes Relative: 8 %
MCH: 31.9 pg (ref 26.0–34.0)
MCHC: 33.2 g/dL (ref 30.0–36.0)
MCV: 96 fL (ref 78.0–100.0)
Monocytes Absolute: 0.4 10*3/uL (ref 0.1–1.0)
Monocytes Relative: 5 %
Neutro Abs: 7.4 10*3/uL (ref 1.7–7.7)
Neutrophils Relative %: 87 %
Platelets: 153 10*3/uL (ref 150–400)
RBC: 3.26 MIL/uL — AB (ref 4.22–5.81)
RDW: 14.5 % (ref 11.5–15.5)
WBC: 8.5 10*3/uL (ref 4.0–10.5)

## 2017-05-25 LAB — BASIC METABOLIC PANEL
Anion gap: 4 — ABNORMAL LOW (ref 5–15)
BUN: 46 mg/dL — AB (ref 6–20)
CHLORIDE: 117 mmol/L — AB (ref 101–111)
CO2: 27 mmol/L (ref 22–32)
Calcium: 8.4 mg/dL — ABNORMAL LOW (ref 8.9–10.3)
Creatinine, Ser: 1.13 mg/dL (ref 0.61–1.24)
GFR calc Af Amer: >60 mL/min (ref >60)
GFR calc non Af Amer: 54 mL/min — ABNORMAL LOW (ref >60)
GLUCOSE: 97 mg/dL (ref 65–99)
POTASSIUM: 4 mmol/L (ref 3.5–5.1)
SODIUM: 148 mmol/L — AB (ref 135–145)

## 2017-05-25 NOTE — Progress Notes (Signed)
  Speech Language Pathology Treatment: Dysphagia  Patient Details Name: Lance Kemp MRN: [id number] DOB: 07/03/1922 Today's Date: 05/25/2017 Time: 4098-1191 SLP Time Calculation (min) (ACUTE ONLY): 15 min  Assessment / Plan / Recommendation Clinical Impression  Dysphagia treatment provided for PO trials. Pt had an immediate, prolonged cough following sip of thin liquid; declined trials of puree (does not like applesauce and did not have another puree consistency available). Pt was also confused during this session, repeatedly asking "who's in charge of this operation" and asking where his son was. RN was on phone with son and relayed this information to him. Agree with previous SLP that if comfort feeds are pursued, the safest diet consistency would be dysphagia 1/ thin liquids by small cup sips with known high risk of aspiration. If diet is pursued, oral care before/ after meals would be beneficial. Will continue to follow for PO trials/ consider need for objective evaluation depending on pt's goals of care.    HPI HPI: 83 y/o male with History of paroxysmal A. Fib of basal cell carcinoma, diastolic CHF, chronic back pain, CKD stage III, history of CVA and dementia presented from SNF with Fever and shortness of breath. In the ED patient was in acute hypoxic respiratory failure Requiring nonrebreather and severe sepsis with fever, tachycardia, tachypnea, Hypotension, Elevated lactic acid, mildly elevated INR, hypernatremia and acute kidney injury. Chest x-ray showed right basilar infiltrate. Patient admitted to stepdown unit for severe sepsis with acute hypoxic respiratory failure secondary to right lobar pneumonia. BSE requested.      SLP Plan  Continue with current plan of care       Recommendations  Diet recommendations: Dysphagia 1 (puree)/Thin liquid vs. Comfort feeds vs. NPO Liquids provided via: Cup Medication Administration: Crushed with puree Supervision: Staff to assist with self  feeding;Full supervision/cueing for compensatory strategies Compensations: Minimize environmental distractions;Slow rate;Small sips/bites Postural Changes and/or Swallow Maneuvers: Seated upright 90 degrees;Upright 30-60 min after meal                Oral Care Recommendations: Oral care QID;Oral care prior to ice chip/H20;Oral care before and after PO Follow up Recommendations: Skilled Nursing facility SLP Visit Diagnosis: Dysphagia, unspecified (R13.10) Plan: Continue with current plan of care       [place], [place], [place] 05/25/2017, 6:57 PM

## 2017-05-25 NOTE — Progress Notes (Signed)
Pharmacy Antibiotic Note  Lance Kemp is a 83 y.o. male admitted on 05/22/2017 with pneumonia.  Pharmacy has been consulted for Vancomycin and Cefepime dosing.  MRSA in [facility].  ? Aspiration.  Plan: ContinueVancomycin 750mg  IV every 24 hours.  Goal trough 15-20 mcg/mL.  Cont Cefepime 1gm IV every 24 hours. Monitor labs, micro and vitals. Consider adding anaerobic coverage if aspiration suspected  Height: 5\' 10"  (177.8 cm)(unsure) Weight: 132 lb 0.9 oz (59.9 kg) IBW/kg (Calculated) : 73  Temp (24hrs), Avg:97.3 F (36.3 C), Min:97.3 F (36.3 C), Max:97.3 F (36.3 C)  Recent Labs  Lab 05/22/17 1647 05/22/17 1650 05/22/17 1724 05/23/17 0404 05/24/17 0536  WBC 9.5  --   --  8.5 11.7*  CREATININE 1.84*  --   --  1.72* 1.34*  LATICACIDVEN  --  2.76* 3.12*  --   --     Estimated Creatinine Clearance: 29.8 mL/min (A) (by C-G formula based on SCr of 1.34 mg/dL (H)).    Allergies  Allergen Reactions  . Altace [Ramipril] Other (See Comments)    Reaction:  Unknown   . Amoxicillin Other (See Comments)    Reaction:  Unknown  Has patient had a PCN reaction causing immediate rash, facial/tongue/throat swelling, SOB or lightheadedness with hypotension: Unsure Has patient had a PCN reaction causing severe rash involving mucus membranes or skin necrosis: Unsure Has patient had a PCN reaction that required hospitalization Unsure Has patient had a PCN reaction occurring within the last 10 years: Unsure If all of the above answers are "NO", then may proceed with Cephalosporin use.  [name] Potassium] Other (See Comments)    Reaction:  Unknown   . Tramadol Other (See Comments)    Makes patient feel very drowsy/almost too strong to take    Antimicrobials this admission: Cefepime 12/4 >>  Vanc 12/4 >>   Dose adjustments this admission: n/a   Microbiology results: 12/4 BCx: 1/2 MRSA  UCx:    Sputum:    MRSA PCR: (+)  Thank you for allowing pharmacy to be a part of this  patient's care.  Excell Seltzer [place] 05/25/2017 9:13 AM

## 2017-05-25 NOTE — Progress Notes (Signed)
PROGRESS NOTE                                                                                                                                                                                                             Patient Demographics:    Lance Kemp, is a 83 y.o. male, DOB - [birth year], EXH:[id number]  Admit date - 05/22/2017   Admitting Physician Reubin Milan, MD  Outpatient Primary MD for the patient is McGowen, [name], MD  LOS - 3  Outpatient Specialists:none  Chief Complaint  Patient presents with  . Respiratory Distress       Brief Narrative   83 y/o male with History of paroxysmal A. Fib of basal cell carcinoma, diastolic CHF, chronic back pain, CKD stage III, history of CVA and dementia presented from SNF with Fever and shortness of breath. In the ED patient was in acute hypoxic respiratory failure Requiring nonrebreather and severe sepsis with fever, tachycardia, tachypnea, Hypotension, Elevated lactic acid, mildly elevated INR, hypernatremia and acute kidney injury. Chest x-ray showed right basilar infiltrate. Patient admitted to stepdown unit for severe sepsis with acute hypoxic respiratory failure secondary to right lobar pneumonia. Blood culture admission growing MRSA bacteremia.   Subjective:   Still requiring oxygen via nasal cannula, restless frequently and pulling out telemetry leads. Denies any pain or discomfort.   Assessment  & Plan :    Principal Problem:  Severe sepsis ([place])   Sepsis due to pneumonia [facility]) Continue ICU monitoring.Blood culture growing MRSA. Continue IV vancomycin and cefepime. Patient is not a candidate for any further invasive procedure like TEE. Continue O2 via nasal cannula and nebs as tolerated.  Prognosis remains guarded. Recommend residential hospice. Awaiting callback from patient's son (Have not been able to reach him since yesterday) -Low-dose when  necessary morphine for comfort.  Active Problems: ? Aspiration pneumonia Keep nothing by mouth. Comfort feed If tolerated  Dementia with acute metabolic encephalopathy Remains confused and restless.  Hypotension Systolic blood pressure in 90s. Off fluids due to congestion. Home blood pressure medications held.  GERD PPI daily.  Glaucoma Continue home medications.  Hyponatremia Secondary to dehydration Received IV fluids Initially, discontinued with congestion on exam.  Acute kidney injury on chronic kidney disease stage III Secondary to sepsis and dehydration.  BPH Resume finasteride once blood pressure stable.  Goals of care Prognosis is guarded. Palliative care consulted. Discussed with son on 12/5 with plan on residential hospice if no significant clinical improvement. Recommend comfort care and residential hospice given his activity less, bacteremia and significant comorbidities. Awaiting callback from the son (have been trying to reach him since yesterday)     Code Status : DO NOT RESUSCITATE  Family Communication  : Discussed with patient's son on the phone on 12/5  Disposition Plan  : Likely residential hospice  Barriers For Discharge : Active symptoms   Consults  :   Palliative care  Procedures  : None  DVT Prophylaxis  :  Heparin  Lab Results  Component Value Date   PLT 153 05/25/2017    Antibiotics  :    Anti-infectives (From admission, onward)   Start     Dose/Rate Route Frequency Ordered Stop   05/23/17 2200  vancomycin (VANCOCIN) IVPB 750 mg/150 ml premix     750 mg 150 mL/hr over 60 Minutes Intravenous Every 24 hours 05/22/17 2155     05/23/17 2000  ceFEPIme (MAXIPIME) 1 g in dextrose 5 % 50 mL IVPB     1 g 100 mL/hr over 30 Minutes Intravenous Every 24 hours 05/22/17 2155     05/22/17 1715  ceFEPIme (MAXIPIME) 2 g in dextrose 5 % 50 mL IVPB     2 g 100 mL/hr over 30 Minutes Intravenous  Once 05/22/17 1711 05/22/17 1958   05/22/17  1715  vancomycin (VANCOCIN) IVPB 1000 mg/200 mL premix     1,000 mg 200 mL/hr over 60 Minutes Intravenous  Once 05/22/17 1711 05/22/17 1914        Objective:   Vitals:   05/25/17 0700 05/25/17 0733 05/25/17 0830 05/25/17 1459  BP: (!) 91/57     Pulse: 77 75    Resp: (!) 23 (!) 22    Temp:      TempSrc:      SpO2: 93% 96% 96% 98%  Weight:      Height:        Wt Readings from Last 3 Encounters:  05/22/17 59.9 kg (132 lb 0.9 oz)  04/16/17 61.3 kg (135 lb 1.6 oz)  04/06/17 61.2 kg (134 lb 14.4 oz)     Intake/Output Summary (Last 24 hours) at 05/25/2017 1519 Last data filed at 05/24/2017 2346 Gross per 24 hour  Intake 203 ml  Output 650 ml  Net -447 ml     Physical Exam Gen.: Elderly male, restless bed, fatigued HEENT: Moist mucosa, supple neck Chest: Clear bilaterally,  no added sounds CVS: Normal S2, no murmurs GI: Soft, nondistended, nontender Musculus warm, no edema CNS: Alert and awake to commands, restless and confused     Data Review:    CBC Recent Labs  Lab 05/22/17 1647 05/23/17 0404 05/24/17 0536 05/25/17 1153  WBC 9.5 8.5 11.7* 8.5  HGB 10.6* 9.4* 9.7* 10.4*  HCT 34.0* 30.2* 30.1* 31.3*  PLT 257 237 181 153  MCV 96.9 97.1 95.0 96.0  MCH 30.2 30.2 30.6 31.9  MCHC 31.2 31.1 32.2 33.2  RDW 14.7 14.8 14.4 14.5  LYMPHSABS 0.7 0.9 0.8 0.6*  MONOABS 0.4 0.3 0.4 0.4  EOSABS 0.0 0.0 0.0 0.0  BASOSABS 0.0 0.0 0.0 0.0    Chemistries  Recent Labs  Lab 05/22/17 1647 05/23/17 0404 05/24/17 0536 05/25/17 1153  NA 149* 148* 147* 148*  K 3.9 3.8 4.1 4.0  CL 111 114* 114* 117*  CO2 29 27 27  27  GLUCOSE 116* 229* 141* 97  BUN 30* 36* 42* 46*  CREATININE 1.84* 1.72* 1.34* 1.13  CALCIUM 8.2* 7.5* 8.1* 8.4*  AST 29  --   --   --   ALT 15*  --   --   --   ALKPHOS 95  --   --   --   BILITOT 0.9  --   --   --    ------------------------------------------------------------------------------------------------------------------ No results for  input(s): CHOL, HDL, LDLCALC, TRIG, CHOLHDL, LDLDIRECT in the last 72 hours.  Lab Results  Component Value Date   HGBA1C 5.5 04/16/2017   ------------------------------------------------------------------------------------------------------------------ No results for input(s): TSH, T4TOTAL, T3FREE, THYROIDAB in the last 72 hours.  Invalid input(s): FREET3 ------------------------------------------------------------------------------------------------------------------ No results for input(s): VITAMINB12, FOLATE, FERRITIN, TIBC, IRON, RETICCTPCT in the last 72 hours.  Coagulation profile Recent Labs  Lab 05/22/17 1647  INR 1.42    No results for input(s): DDIMER in the last 72 hours.  Cardiac Enzymes No results for input(s): CKMB, TROPONINI, MYOGLOBIN in the last 168 hours.  Invalid input(s): CK ------------------------------------------------------------------------------------------------------------------    Component Value Date/Time   BNP 194.0 (H) 02/11/2017 1933    Inpatient Medications  Scheduled Meds: . aspirin  81 mg Oral Daily  . Chlorhexidine Gluconate Cloth  6 each Topical Q0600  . finasteride  5 mg Oral Daily  . heparin  5,000 Units Subcutaneous Q8H  . ipratropium  0.5 mg Nebulization Q6H  . ketotifen  2 drop Both Eyes QHS  . levalbuterol  1.25 mg Nebulization Q6H  . levobunolol  1 drop Both Eyes BID  . mupirocin ointment  1 application Nasal BID  . pantoprazole  40 mg Oral Daily  . sodium chloride flush  3 mL Intravenous Q12H   Continuous Infusions: . sodium chloride    . ceFEPime (MAXIPIME) IV Stopped (05/24/17 2346)  . vancomycin Stopped (05/24/17 2312)   PRN Meds:.sodium chloride, acetaminophen, acetaminophen, guaiFENesin, morphine injection, ondansetron **OR** ondansetron (ZOFRAN) IV, polyethylene glycol, sodium chloride flush  Micro Results Recent Results (from the past 240 hour(s))  Culture, blood (Routine x 2)     Status: None (Preliminary  result)   Collection Time: 05/22/17  5:26 PM  Result Value Ref Range Status   Specimen Description BLOOD RIGHT HAND  Final   Special Requests   Final    BOTTLES DRAWN AEROBIC AND ANAEROBIC Blood Culture adequate volume   Culture NO GROWTH 3 DAYS  Final   Report Status PENDING  Incomplete  Culture, blood (Routine x 2)     Status: None (Preliminary result)   Collection Time: 05/22/17  5:26 PM  Result Value Ref Range Status   Specimen Description LEFT ANTECUBITAL  Final   Special Requests   Final    BOTTLES DRAWN AEROBIC AND ANAEROBIC Blood Culture adequate volume   Culture  Setup Time   Final    GRAM POSITIVE COCCI Gram Stain Report Called to,Read Back By and Verified With: CAPMPOS,E AT [place] ON 12.5.2018 BY ISLEY,B Performed at [facility], READ BACK BY AND VERIFIED WITH: Clarene Essex RN [id number] 05/24/17 A BROWNING    Culture   Final    GRAM POSITIVE COCCI CULTURE REINCUBATED FOR BETTER GROWTH Performed at [facility], [place] [address], [place], [place] [address]    Report Status PENDING  Incomplete  Blood Culture ID Panel (Reflexed)     Status: Abnormal   Collection Time: 05/22/17  5:26 PM  Result Value Ref Range Status  Enterococcus species NOT DETECTED NOT DETECTED Final   Listeria monocytogenes NOT DETECTED NOT DETECTED Final   Staphylococcus species DETECTED (A) NOT DETECTED Final    Comment: CRITICAL RESULT CALLED TO, READ BACK BY AND VERIFIED WITH: Clarene Essex RN [phone] 05/24/17 A BROWNING    Staphylococcus aureus DETECTED (A) NOT DETECTED Final    Comment: Methicillin (oxacillin)-resistant Staphylococcus aureus (MRSA). MRSA is predictably resistant to beta-lactam antibiotics (except ceftaroline). Preferred therapy is vancomycin unless clinically contraindicated. Patient requires contact precautions if  hospitalized. CRITICAL RESULT CALLED TO, READ BACK BY AND VERIFIED WITH: Clarene Essex RN [id number] 05/24/17 A BROWNING    Methicillin resistance  DETECTED (A) NOT DETECTED Final    Comment: CRITICAL RESULT CALLED TO, READ BACK BY AND VERIFIED WITH: Clarene Essex RN [phone] 05/24/17 A BROWNING    Streptococcus species NOT DETECTED NOT DETECTED Final   Streptococcus agalactiae NOT DETECTED NOT DETECTED Final   Streptococcus pneumoniae NOT DETECTED NOT DETECTED Final   Streptococcus pyogenes NOT DETECTED NOT DETECTED Final   Acinetobacter baumannii NOT DETECTED NOT DETECTED Final   Enterobacteriaceae species NOT DETECTED NOT DETECTED Final   Enterobacter cloacae complex NOT DETECTED NOT DETECTED Final   Escherichia coli NOT DETECTED NOT DETECTED Final   Klebsiella oxytoca NOT DETECTED NOT DETECTED Final   Klebsiella pneumoniae NOT DETECTED NOT DETECTED Final   Proteus species NOT DETECTED NOT DETECTED Final   Serratia marcescens NOT DETECTED NOT DETECTED Final   Haemophilus influenzae NOT DETECTED NOT DETECTED Final   Neisseria meningitidis NOT DETECTED NOT DETECTED Final   Pseudomonas aeruginosa NOT DETECTED NOT DETECTED Final   Candida albicans NOT DETECTED NOT DETECTED Final   Candida glabrata NOT DETECTED NOT DETECTED Final   Candida krusei NOT DETECTED NOT DETECTED Final   Candida parapsilosis NOT DETECTED NOT DETECTED Final   Candida tropicalis NOT DETECTED NOT DETECTED Final    Comment: Performed at [facility], [place]. [address], [place], [place] [address]  MRSA PCR Screening     Status: Abnormal   Collection Time: 05/22/17  9:40 PM  Result Value Ref Range Status   MRSA by PCR POSITIVE (A) NEGATIVE Final    Comment:        The GeneXpert MRSA Assay (FDA approved for NASAL specimens only), is one component of a comprehensive MRSA colonization surveillance program. It is not intended to diagnose MRSA infection nor to guide or monitor treatment for MRSA infections. RESULT CALLED TO, READ BACK BY AND VERIFIED WITH: MCGIBBONY C. AT 0932A ON [id number] BY THOMPSON S.     Radiology Reports Dg Chest Portable 1 View  Result  Date: 05/22/2017 CLINICAL DATA:  Hypoxemia, increased mucus secretions, fever this morning, history atrial fibrillation, CHF, hypertension, NSTEMI, former smoker, prior stroke EXAM: PORTABLE CHEST 1 VIEW COMPARISON:  Portable exam 1727 hours compared to 04/04/2017 FINDINGS: Normal heart size and mediastinal contours. Atherosclerotic calcification aorta. Slightly decreased lung volumes versus previous exam with bibasilar opacities, favoring infiltrate/pneumonia at RIGHT base and either atelectasis or infiltrate at LEFT base. Upper lungs clear. No pleural effusion or pneumothorax. Bones demineralized. IMPRESSION: RIGHT basilar infiltrate consistent with pneumonia. Additional infiltrate versus atelectasis at LEFT base. Electronically Signed   By: Lavonia Dana M.D.   On: 05/22/2017 17:48    Time Spent in minutes  25   Jariya Reichow M.D on 05/25/2017 at 3:19 PM  Between 7am to 7pm - Pager - [phone]  After 7pm go to [email or web address] - password [email or web address]  [facility] -  Office  [phone]     

## 2017-05-25 NOTE — Progress Notes (Signed)
1828 patient ia med-surg and transferring to Dept 300 room# 334. Report called and given nurse to [facility], RN who is receiving patient. Amy, SLP just arrived to unit to evaluate patient. Will move patient once SLP is finished.

## 2017-05-25 NOTE — Progress Notes (Signed)
1138 Patient continues to c/o pain "all over'. Patient noted grimacing, moaning, fidgety, and pulling at monitors. MD notified.

## 2017-05-26 LAB — BASIC METABOLIC PANEL
ANION GAP: 7 (ref 5–15)
BUN: 40 mg/dL — ABNORMAL HIGH (ref 6–20)
CO2: 25 mmol/L (ref 22–32)
Calcium: 8.2 mg/dL — ABNORMAL LOW (ref 8.9–10.3)
Chloride: 119 mmol/L — ABNORMAL HIGH (ref 101–111)
Creatinine, Ser: 1.06 mg/dL (ref 0.61–1.24)
GFR calc Af Amer: >60 mL/min (ref >60)
GFR calc non Af Amer: 59 mL/min — ABNORMAL LOW (ref >60)
GLUCOSE: 83 mg/dL (ref 65–99)
POTASSIUM: 3.7 mmol/L (ref 3.5–5.1)
Sodium: 151 mmol/L — ABNORMAL HIGH (ref 135–145)

## 2017-05-26 LAB — CBC WITH DIFFERENTIAL/PLATELET
BASOS ABS: 0 10*3/uL (ref 0.0–0.1)
Basophils Relative: 0 %
Eosinophils Absolute: 0.1 10*3/uL (ref 0.0–0.7)
Eosinophils Relative: 1 %
HEMATOCRIT: 35.6 % — AB (ref 39.0–52.0)
Hemoglobin: 11.2 g/dL — ABNORMAL LOW (ref 13.0–17.0)
LYMPHS ABS: 1.1 10*3/uL (ref 0.7–4.0)
LYMPHS PCT: 11 %
MCH: 30.1 pg (ref 26.0–34.0)
MCHC: 31.5 g/dL (ref 30.0–36.0)
MCV: 95.7 fL (ref 78.0–100.0)
MONO ABS: 0.9 10*3/uL (ref 0.1–1.0)
Monocytes Relative: 9 %
NEUTROS ABS: 7.8 10*3/uL — AB (ref 1.7–7.7)
Neutrophils Relative %: 79 %
Platelets: 179 10*3/uL (ref 150–400)
RBC: 3.72 MIL/uL — AB (ref 4.22–5.81)
RDW: 14.5 % (ref 11.5–15.5)
WBC: 9.9 10*3/uL (ref 4.0–10.5)

## 2017-05-26 MED ORDER — LEVALBUTEROL HCL 1.25 MG/0.5ML IN NEBU
1.2500 mg | INHALATION_SOLUTION | Freq: Four times a day (QID) | RESPIRATORY_TRACT | Status: DC | PRN
  Administered 2017-05-26: 1.25 mg via RESPIRATORY_TRACT
  Filled 2017-05-26: qty 0.5

## 2017-05-26 NOTE — Progress Notes (Signed)
Nutrition Brief Note  Patient identified on the Malnutrition Screening Tool (MST) Report  Patient has poor prognosis and likely will be transitioned to full comfort care or resendential hospice shortly.   He is confused/restless and has morphine ordered for comfort. He remains NPO, though can have comfort feeds if tolerated.   No nutrition interventions warranted at this time. If nutrition issues arise, please consult RD.   Burtis Junes RD, LDN, CNSC Clinical Nutrition Pager: [phone] 05/26/2017 2:21 PM

## 2017-05-26 NOTE — Progress Notes (Signed)
PROGRESS NOTE                                                                                                                                                                                                             Patient Demographics:    Lance Kemp, is a 83 y.o. male, DOB - January 06, 1923, VHQ:[id number]  Admit date - 05/22/2017   Admitting Physician Reubin Milan, MD  Outpatient Primary MD for the patient is McGowen, [name], MD  LOS - 4  Outpatient Specialists:none  Chief Complaint  Patient presents with  . Respiratory Distress       Brief Narrative   83 y/o male with History of paroxysmal A. Fib of basal cell carcinoma, diastolic CHF, chronic back pain, CKD stage III, history of CVA and dementia presented from SNF with Fever and shortness of breath. In the ED patient was in acute hypoxic respiratory failure Requiring nonrebreather and severe sepsis with fever, tachycardia, tachypnea, Hypotension, Elevated lactic acid, mildly elevated INR, hypernatremia and acute kidney injury. Chest x-ray showed right basilar infiltrate. Patient admitted to stepdown unit for severe sepsis with acute hypoxic respiratory failure secondary to right lobar pneumonia. Blood culture admission growing MRSA bacteremia.   Subjective:   Continues to be restless and confused.  Assessment  & Plan :    Principal Problem:  Severe sepsis ([place])   Sepsis due to pneumonia ([place]) Associated with MRSA bacteremia. Now goal for comfort. I have been unsuccessful in discussing option for residential hospice with patient's son although during our last conversation he agreed on making patient comfort care and Discharge to hospice if no improvement. Continue when necessary IV morphine for pain and comfort. Oxygen as needed. Patient lost IV access today. Will discontinue further antibiotics.  Active Problems: ? Aspiration pneumonia Comfort feed As  tolerated.  Dementia with acute metabolic encephalopathy Remains confused and restless.  Hypotension Blood pressure currently stable. Continue to hold home blood pressure medications.  GERD PPI daily.  Glaucoma Continue home medications.  Hyponatremia Secondary to dehydration Received IV fluids Initially, discontinued with congestion on exam.  Acute kidney injury on chronic kidney disease stage III Secondary to sepsis and dehydration.  BPH Resume finasteride once blood pressure stable.   Goals of care Guarded prognosis. Needs residential hospice. Unable to get in touch with son to finalize our discussion  we had on 12/5. Son had called the floor last evening After I had left. Tried calling him today but unable to reach him.     Code Status : DO NOT RESUSCITATE  Family Communication  : Discussed with patient's son on the phone on 12/5. Have called and left a voice message for last 3 days.  Disposition Plan  : Needs residential hospice.  Barriers For Discharge : Active symptoms   Consults  :   Palliative care  Procedures  : None  DVT Prophylaxis  :  Heparin  Lab Results  Component Value Date   PLT 179 05/26/2017    Antibiotics  :    Anti-infectives (From admission, onward)   Start     Dose/Rate Route Frequency Ordered Stop   05/23/17 2200  vancomycin (VANCOCIN) IVPB 750 mg/150 ml premix     750 mg 150 mL/hr over 60 Minutes Intravenous Every 24 hours 05/22/17 2155     05/23/17 2000  ceFEPIme (MAXIPIME) 1 g in dextrose 5 % 50 mL IVPB     1 g 100 mL/hr over 30 Minutes Intravenous Every 24 hours 05/22/17 2155     05/22/17 1715  ceFEPIme (MAXIPIME) 2 g in dextrose 5 % 50 mL IVPB     2 g 100 mL/hr over 30 Minutes Intravenous  Once 05/22/17 1711 05/22/17 1958   05/22/17 1715  vancomycin (VANCOCIN) IVPB 1000 mg/200 mL premix     1,000 mg 200 mL/hr over 60 Minutes Intravenous  Once 05/22/17 1711 05/22/17 1914        Objective:   Vitals:   05/26/17 0123  05/26/17 0823 05/26/17 1318 05/26/17 1406  BP:   (!) 152/51   Pulse:   68   Resp:   19   Temp:   98.1 F (36.7 C)   TempSrc:   Oral   SpO2: 93% 94% 99% 93%  Weight:      Height:        Wt Readings from Last 3 Encounters:  05/22/17 59.9 kg (132 lb 0.9 oz)  04/16/17 61.3 kg (135 lb 1.6 oz)  04/06/17 61.2 kg (134 lb 14.4 oz)     Intake/Output Summary (Last 24 hours) at 05/26/2017 1617 Last data filed at 05/26/2017 1358 Gross per 24 hour  Intake 200 ml  Output 300 ml  Net -100 ml     Physical Exam Elderly male continues to be restless, fatigue HEENT: Moist mucosa, supple neck Chest: Coarse breath sounds bilaterally, no added sounds  CVS: Normal S1 and S2, no murmurs GI: Soft, nondistended, nontender Musculoskeletal: Warm, no edema CNS: Awake to command, confused      Data Review:    CBC Recent Labs  Lab 05/22/17 1647 05/23/17 0404 05/24/17 0536 05/25/17 1153 05/26/17 0656  WBC 9.5 8.5 11.7* 8.5 9.9  HGB 10.6* 9.4* 9.7* 10.4* 11.2*  HCT 34.0* 30.2* 30.1* 31.3* 35.6*  PLT 257 237 181 153 179  MCV 96.9 97.1 95.0 96.0 95.7  MCH 30.2 30.2 30.6 31.9 30.1  MCHC 31.2 31.1 32.2 33.2 31.5  RDW 14.7 14.8 14.4 14.5 14.5  LYMPHSABS 0.7 0.9 0.8 0.6* 1.1  MONOABS 0.4 0.3 0.4 0.4 0.9  EOSABS 0.0 0.0 0.0 0.0 0.1  BASOSABS 0.0 0.0 0.0 0.0 0.0    Chemistries  Recent Labs  Lab 05/22/17 1647 05/23/17 0404 05/24/17 0536 05/25/17 1153 05/26/17 0656  NA 149* 148* 147* 148* 151*  K 3.9 3.8 4.1 4.0 3.7  CL 111 114* 114* 117* 119*  CO2 29 27 27 27 25   GLUCOSE 116* 229* 141* 97 83  BUN 30* 36* 42* 46* 40*  CREATININE 1.84* 1.72* 1.34* 1.13 1.06  CALCIUM 8.2* 7.5* 8.1* 8.4* 8.2*  AST 29  --   --   --   --   ALT 15*  --   --   --   --   ALKPHOS 95  --   --   --   --   BILITOT 0.9  --   --   --   --    ------------------------------------------------------------------------------------------------------------------ No results for input(s): CHOL, HDL, LDLCALC, TRIG,  CHOLHDL, LDLDIRECT in the last 72 hours.  Lab Results  Component Value Date   HGBA1C 5.5 04/16/2017   ------------------------------------------------------------------------------------------------------------------ No results for input(s): TSH, T4TOTAL, T3FREE, THYROIDAB in the last 72 hours.  Invalid input(s): FREET3 ------------------------------------------------------------------------------------------------------------------ No results for input(s): VITAMINB12, FOLATE, FERRITIN, TIBC, IRON, RETICCTPCT in the last 72 hours.  Coagulation profile Recent Labs  Lab 05/22/17 1647  INR 1.42    No results for input(s): DDIMER in the last 72 hours.  Cardiac Enzymes No results for input(s): CKMB, TROPONINI, MYOGLOBIN in the last 168 hours.  Invalid input(s): CK ------------------------------------------------------------------------------------------------------------------    Component Value Date/Time   BNP 194.0 (H) 02/11/2017 1933    Inpatient Medications  Scheduled Meds: . aspirin  81 mg Oral Daily  . Chlorhexidine Gluconate Cloth  6 each Topical Q0600  . finasteride  5 mg Oral Daily  . heparin  5,000 Units Subcutaneous Q8H  . ipratropium  0.5 mg Nebulization Q6H  . ketotifen  2 drop Both Eyes QHS  . levalbuterol  1.25 mg Nebulization Q6H  . levobunolol  1 drop Both Eyes BID  . mupirocin ointment  1 application Nasal BID  . pantoprazole  40 mg Oral Daily  . sodium chloride flush  3 mL Intravenous Q12H   Continuous Infusions: . sodium chloride    . ceFEPime (MAXIPIME) IV 1 g (05/26/17 0500)  . vancomycin 750 mg (05/25/17 2344)   PRN Meds:.sodium chloride, acetaminophen, acetaminophen, guaiFENesin, morphine injection, ondansetron **OR** ondansetron (ZOFRAN) IV, polyethylene glycol, sodium chloride flush  Micro Results Recent Results (from the past 240 hour(s))  Culture, blood (Routine x 2)     Status: None (Preliminary result)   Collection Time: 05/22/17  5:26  PM  Result Value Ref Range Status   Specimen Description BLOOD RIGHT HAND  Final   Special Requests   Final    BOTTLES DRAWN AEROBIC AND ANAEROBIC Blood Culture adequate volume   Culture NO GROWTH 4 DAYS  Final   Report Status PENDING  Incomplete  Culture, blood (Routine x 2)     Status: Abnormal (Preliminary result)   Collection Time: 05/22/17  5:26 PM  Result Value Ref Range Status   Specimen Description LEFT ANTECUBITAL  Final   Special Requests   Final    BOTTLES DRAWN AEROBIC AND ANAEROBIC Blood Culture adequate volume   Culture  Setup Time   Final    GRAM POSITIVE COCCI AEROBIC BOTTLE ONLY Gram Stain Report Called to,Read Back By and Verified With: CAPMPOS,E AT [place] ON 12.5.2018 BY ISLEY,B Performed at [facility], READ BACK BY AND VERIFIED WITH: Clarene Essex RN [id number] 05/24/17 A BROWNING    Culture (A)  Final    STAPHYLOCOCCUS AUREUS SUSCEPTIBILITIES TO FOLLOW Performed at [facility], [place] [address] [place], [place] [address]    Report Status PENDING  Incomplete  Blood Culture  ID Panel (Reflexed)     Status: Abnormal   Collection Time: 05/22/17  5:26 PM  Result Value Ref Range Status   Enterococcus species NOT DETECTED NOT DETECTED Final   Listeria monocytogenes NOT DETECTED NOT DETECTED Final   Staphylococcus species DETECTED (A) NOT DETECTED Final    Comment: CRITICAL RESULT CALLED TO, READ BACK BY AND VERIFIED WITH: Clarene Essex RN [phone] 05/24/17 A BROWNING    Staphylococcus aureus DETECTED (A) NOT DETECTED Final    Comment: Methicillin (oxacillin)-resistant Staphylococcus aureus (MRSA). MRSA is predictably resistant to beta-lactam antibiotics (except ceftaroline). Preferred therapy is vancomycin unless clinically contraindicated. Patient requires contact precautions if  hospitalized. CRITICAL RESULT CALLED TO, READ BACK BY AND VERIFIED WITH: Clarene Essex RN [id number] 05/24/17 A BROWNING    Methicillin resistance DETECTED (A) NOT DETECTED  Final    Comment: CRITICAL RESULT CALLED TO, READ BACK BY AND VERIFIED WITH: Clarene Essex RN [phone] 05/24/17 A BROWNING    Streptococcus species NOT DETECTED NOT DETECTED Final   Streptococcus agalactiae NOT DETECTED NOT DETECTED Final   Streptococcus pneumoniae NOT DETECTED NOT DETECTED Final   Streptococcus pyogenes NOT DETECTED NOT DETECTED Final   Acinetobacter baumannii NOT DETECTED NOT DETECTED Final   Enterobacteriaceae species NOT DETECTED NOT DETECTED Final   Enterobacter cloacae complex NOT DETECTED NOT DETECTED Final   Escherichia coli NOT DETECTED NOT DETECTED Final   Klebsiella oxytoca NOT DETECTED NOT DETECTED Final   Klebsiella pneumoniae NOT DETECTED NOT DETECTED Final   Proteus species NOT DETECTED NOT DETECTED Final   Serratia marcescens NOT DETECTED NOT DETECTED Final   Haemophilus influenzae NOT DETECTED NOT DETECTED Final   Neisseria meningitidis NOT DETECTED NOT DETECTED Final   Pseudomonas aeruginosa NOT DETECTED NOT DETECTED Final   Candida albicans NOT DETECTED NOT DETECTED Final   Candida glabrata NOT DETECTED NOT DETECTED Final   Candida krusei NOT DETECTED NOT DETECTED Final   Candida parapsilosis NOT DETECTED NOT DETECTED Final   Candida tropicalis NOT DETECTED NOT DETECTED Final    Comment: Performed at [facility], [place]. [address], [place], [place] [address]  MRSA PCR Screening     Status: Abnormal   Collection Time: 05/22/17  9:40 PM  Result Value Ref Range Status   MRSA by PCR POSITIVE (A) NEGATIVE Final    Comment:        The GeneXpert MRSA Assay (FDA approved for NASAL specimens only), is one component of a comprehensive MRSA colonization surveillance program. It is not intended to diagnose MRSA infection nor to guide or monitor treatment for MRSA infections. RESULT CALLED TO, READ BACK BY AND VERIFIED WITH: MCGIBBONY C. AT [id number] ON [id number] BY THOMPSON S.     Radiology Reports Dg Chest Portable 1 View  Result Date: 05/22/2017 CLINICAL  DATA:  Hypoxemia, increased mucus secretions, fever this morning, history atrial fibrillation, CHF, hypertension, NSTEMI, former smoker, prior stroke EXAM: PORTABLE CHEST 1 VIEW COMPARISON:  Portable exam 1727 hours compared to 04/04/2017 FINDINGS: Normal heart size and mediastinal contours. Atherosclerotic calcification aorta. Slightly decreased lung volumes versus previous exam with bibasilar opacities, favoring infiltrate/pneumonia at RIGHT base and either atelectasis or infiltrate at LEFT base. Upper lungs clear. No pleural effusion or pneumothorax. Bones demineralized. IMPRESSION: RIGHT basilar infiltrate consistent with pneumonia. Additional infiltrate versus atelectasis at LEFT base. Electronically Signed   By: Lavonia Dana M.D.   On: 05/22/2017 17:48    Time Spent in minutes  25   Imelda Dandridge M.D on 05/26/2017 at 4:17 PM  Between 7am to 7pm - Pager - [phone]  After 7pm go to [email or web address] - password [facility]  [facility] -  Office  [phone]

## 2017-05-26 NOTE — Progress Notes (Signed)
Tried ntsx patient without success due to patient reaching for catheter. Patient also managed to tear skin on rt hand due to wrapping hand in suction tubing. Skin tear cleansed and dressed. Nurse notified of tear.

## 2017-05-26 NOTE — Progress Notes (Addendum)
CSW aware of discharge transportation and placement needs for patient. CSW spoke with [facility] to see if patient could return however no admissions staff currently available and won't be until Monday. CSW spoke with admin staff member at [facility], no beds currently available.  CSW spoke with RN at [facility] to provide update, RN to update MD.  Madilyn Fireman, MSW, LCSW-A Weekend Clinical Social Worker [phone]

## 2017-05-27 ENCOUNTER — Inpatient Hospital Stay (HOSPITAL_COMMUNITY): Payer: Medicare Other

## 2017-05-27 LAB — CULTURE, BLOOD (ROUTINE X 2)
CULTURE: NO GROWTH
SPECIAL REQUESTS: ADEQUATE

## 2017-05-27 NOTE — Progress Notes (Signed)
PROGRESS NOTE                                                                                                                                                                                                             Patient Demographics:    Lance Kemp, is a 83 y.o. male, DOB - Aug 28, 1922, QBV:[id number]  Admit date - 05/22/2017   Admitting Physician Reubin Milan, MD  Outpatient Primary MD for the patient is McGowen, [name], MD  LOS - 5  Outpatient Specialists:none  Chief Complaint  Patient presents with  . Respiratory Distress       Brief Narrative   83 y/o male with History of paroxysmal A. Fib of basal cell carcinoma, diastolic CHF, chronic back pain, CKD stage III, history of CVA and dementia presented from SNF with Fever and shortness of breath. In the ED patient was in acute hypoxic respiratory failure Requiring nonrebreather and severe sepsis with fever, tachycardia, tachypnea, Hypotension, Elevated lactic acid, mildly elevated INR, hypernatremia and acute kidney injury. Chest x-ray showed right basilar infiltrate. Patient admitted to stepdown unit for severe sepsis with acute hypoxic respiratory failure secondary to right lobar pneumonia. Blood culture admission growing MRSA bacteremia.   Subjective:   Remains restless and confused. Has coarse cough.  Assessment  & Plan :    Principal Problem:  Severe sepsis ([place])   Sepsis due to pneumonia ([place]) Associated with MRSA bacteremia. Now goal for comfort. I have been unsuccessful in discussing option for residential hospice with patient's son although during our last conversation he agreed on making patient comfort care and Discharge to hospice if no improvement. Continue when necessary IV morphine for pain and comfort. Oxygen as needed. Lost IV access. PICC line ordered for IV Antibiotics while in the hospital and need for IV pain medications. (Morphine  drip if needed)  Active Problems: ? Aspiration pneumonia Comfort feed as tolerated.  Dementia with acute metabolic encephalopathy Remains confused and restless.  Hypotension Blood pressure currently stable. Continue to hold home blood pressure medications.  GERD PPI daily.  Glaucoma Continue home medications.  Hyponatremia Secondary to dehydration Received IV fluids Initially, discontinued with congestion on exam.  Acute kidney injury on chronic kidney disease stage III Secondary to sepsis and dehydration.  BPH Resume finasteride once blood pressure stable.   Goals of care Guarded  prognosis. Needs residential hospice. Unable to get in touch with son to finalize our discussion we had on 12/5. Son had called the floor last evening After I had left. Tried calling him today but unable to reach him.     Code Status : DO NOT RESUSCITATE  Family Communication  : Discussed with patient's son on the phone on 12/5. And been calling and leaving a voice message every day But unable to reach him.  Disposition Plan  : Residential hospice  Barriers For Discharge : Awaiting residential hospice bed   Consults  :   Palliative care  Procedures  : None  DVT Prophylaxis  :  Heparin  Lab Results  Component Value Date   PLT 179 05/26/2017    Antibiotics  :    Anti-infectives (From admission, onward)   Start     Dose/Rate Route Frequency Ordered Stop   05/23/17 2200  vancomycin (VANCOCIN) IVPB 750 mg/150 ml premix     750 mg 150 mL/hr over 60 Minutes Intravenous Every 24 hours 05/22/17 2155     05/23/17 2000  ceFEPIme (MAXIPIME) 1 g in dextrose 5 % 50 mL IVPB     1 g 100 mL/hr over 30 Minutes Intravenous Every 24 hours 05/22/17 2155     05/22/17 1715  ceFEPIme (MAXIPIME) 2 g in dextrose 5 % 50 mL IVPB     2 g 100 mL/hr over 30 Minutes Intravenous  Once 05/22/17 1711 05/22/17 1958   05/22/17 1715  vancomycin (VANCOCIN) IVPB 1000 mg/200 mL premix     1,000 mg 200 mL/hr  over 60 Minutes Intravenous  Once 05/22/17 1711 05/22/17 1914        Objective:   Vitals:   05/26/17 1406 05/26/17 2001 05/26/17 2126 05/27/17 0611  BP:   (!) 137/97 (!) 144/60  Pulse:   84 79  Resp:   18 17  Temp:   (!) 97.4 F (36.3 C) 97.8 F (36.6 C)  TempSrc:   Axillary Axillary  SpO2: 93% (!) 86% 97% 95%  Weight:      Height:        Wt Readings from Last 3 Encounters:  05/22/17 59.9 kg (132 lb 0.9 oz)  04/16/17 61.3 kg (135 lb 1.6 oz)  04/06/17 61.2 kg (134 lb 14.4 oz)    No intake or output data in the 24 hours ending 05/27/17 1418   Physical Exam Elderly male restless, confused HEENT: Moist mucosa, supple neck  Chest: Coarse breath sounds bilaterally CVS: Normal S1 and S2, no murmurs GI: Soft, nondistended, nontender Musculoskeletal: Warm, no edema      Data Review:    CBC Recent Labs  Lab 05/22/17 1647 05/23/17 0404 05/24/17 0536 05/25/17 1153 05/26/17 0656  WBC 9.5 8.5 11.7* 8.5 9.9  HGB 10.6* 9.4* 9.7* 10.4* 11.2*  HCT 34.0* 30.2* 30.1* 31.3* 35.6*  PLT 257 237 181 153 179  MCV 96.9 97.1 95.0 96.0 95.7  MCH 30.2 30.2 30.6 31.9 30.1  MCHC 31.2 31.1 32.2 33.2 31.5  RDW 14.7 14.8 14.4 14.5 14.5  LYMPHSABS 0.7 0.9 0.8 0.6* 1.1  MONOABS 0.4 0.3 0.4 0.4 0.9  EOSABS 0.0 0.0 0.0 0.0 0.1  BASOSABS 0.0 0.0 0.0 0.0 0.0    Chemistries  Recent Labs  Lab 05/22/17 1647 05/23/17 0404 05/24/17 0536 05/25/17 1153 05/26/17 0656  NA 149* 148* 147* 148* 151*  K 3.9 3.8 4.1 4.0 3.7  CL 111 114* 114* 117* 119*  CO2 29 27 27 27  25  GLUCOSE 116* 229* 141* 97 83  BUN 30* 36* 42* 46* 40*  CREATININE 1.84* 1.72* 1.34* 1.13 1.06  CALCIUM 8.2* 7.5* 8.1* 8.4* 8.2*  AST 29  --   --   --   --   ALT 15*  --   --   --   --   ALKPHOS 95  --   --   --   --   BILITOT 0.9  --   --   --   --    ------------------------------------------------------------------------------------------------------------------ No results for input(s): CHOL, HDL, LDLCALC, TRIG,  CHOLHDL, LDLDIRECT in the last 72 hours.  Lab Results  Component Value Date   HGBA1C 5.5 04/16/2017   ------------------------------------------------------------------------------------------------------------------ No results for input(s): TSH, T4TOTAL, T3FREE, THYROIDAB in the last 72 hours.  Invalid input(s): FREET3 ------------------------------------------------------------------------------------------------------------------ No results for input(s): VITAMINB12, FOLATE, FERRITIN, TIBC, IRON, RETICCTPCT in the last 72 hours.  Coagulation profile Recent Labs  Lab 05/22/17 1647  INR 1.42    No results for input(s): DDIMER in the last 72 hours.  Cardiac Enzymes No results for input(s): CKMB, TROPONINI, MYOGLOBIN in the last 168 hours.  Invalid input(s): CK ------------------------------------------------------------------------------------------------------------------    Component Value Date/Time   BNP 194.0 (H) 02/11/2017 1933    Inpatient Medications  Scheduled Meds: . Chlorhexidine Gluconate Cloth  6 each Topical Q0600  . ketotifen  2 drop Both Eyes QHS  . levobunolol  1 drop Both Eyes BID  . mupirocin ointment  1 application Nasal BID  . sodium chloride flush  3 mL Intravenous Q12H   Continuous Infusions: . sodium chloride    . ceFEPime (MAXIPIME) IV 1 g (05/26/17 0500)  . vancomycin 750 mg (05/25/17 2344)   PRN Meds:.sodium chloride, acetaminophen, acetaminophen, guaiFENesin, levalbuterol, morphine injection, ondansetron **OR** ondansetron (ZOFRAN) IV, polyethylene glycol, sodium chloride flush  Micro Results Recent Results (from the past 240 hour(s))  Culture, blood (Routine x 2)     Status: None   Collection Time: 05/22/17  5:26 PM  Result Value Ref Range Status   Specimen Description BLOOD RIGHT HAND  Final   Special Requests   Final    BOTTLES DRAWN AEROBIC AND ANAEROBIC Blood Culture adequate volume   Culture NO GROWTH 5 DAYS  Final   Report  Status 05/27/2017 FINAL  Final  Culture, blood (Routine x 2)     Status: Abnormal (Preliminary result)   Collection Time: 05/22/17  5:26 PM  Result Value Ref Range Status   Specimen Description LEFT ANTECUBITAL  Final   Special Requests   Final    BOTTLES DRAWN AEROBIC AND ANAEROBIC Blood Culture adequate volume   Culture  Setup Time   Final    GRAM POSITIVE COCCI AEROBIC BOTTLE ONLY Gram Stain Report Called to,Read Back By and Verified With: CAPMPOS,E AT [place] ON 12.5.2018 BY ISLEY,B Performed at [place] BY AND VERIFIED WITH: Clarene Essex RN [id number] 05/24/17 A BROWNING Performed at [facility], [place] [address], [place], [place] [address]    Culture (A)  Final    METHICILLIN RESISTANT STAPHYLOCOCCUS AUREUS STAPHYLOCOCCUS SPECIES (COAGULASE NEGATIVE)    Report Status PENDING  Incomplete   Organism ID, Bacteria METHICILLIN RESISTANT STAPHYLOCOCCUS AUREUS  Final      Susceptibility   Methicillin resistant staphylococcus aureus - MIC*    CIPROFLOXACIN >=8 RESISTANT Resistant     ERYTHROMYCIN <=0.25 SENSITIVE Sensitive     GENTAMICIN <=0.5 SENSITIVE Sensitive     OXACILLIN >=4 RESISTANT  Resistant     TETRACYCLINE <=1 SENSITIVE Sensitive     VANCOMYCIN 1 SENSITIVE Sensitive     TRIMETH/SULFA >=320 RESISTANT Resistant     CLINDAMYCIN <=0.25 SENSITIVE Sensitive     RIFAMPIN <=0.5 SENSITIVE Sensitive     Inducible Clindamycin NEGATIVE Sensitive     * METHICILLIN RESISTANT STAPHYLOCOCCUS AUREUS  Blood Culture ID Panel (Reflexed)     Status: Abnormal   Collection Time: 05/22/17  5:26 PM  Result Value Ref Range Status   Enterococcus species NOT DETECTED NOT DETECTED Final   Listeria monocytogenes NOT DETECTED NOT DETECTED Final   Staphylococcus species DETECTED (A) NOT DETECTED Final    Comment: CRITICAL RESULT CALLED TO, READ BACK BY AND VERIFIED WITH: Clarene Essex RN [phone] 05/24/17 A BROWNING    Staphylococcus aureus DETECTED (A) NOT DETECTED  Final    Comment: Methicillin (oxacillin)-resistant Staphylococcus aureus (MRSA). MRSA is predictably resistant to beta-lactam antibiotics (except ceftaroline). Preferred therapy is vancomycin unless clinically contraindicated. Patient requires contact precautions if  hospitalized. CRITICAL RESULT CALLED TO, READ BACK BY AND VERIFIED WITH: Clarene Essex RN [id number] 05/24/17 A BROWNING    Methicillin resistance DETECTED (A) NOT DETECTED Final    Comment: CRITICAL RESULT CALLED TO, READ BACK BY AND VERIFIED WITH: Clarene Essex RN [phone] 05/24/17 A BROWNING    Streptococcus species NOT DETECTED NOT DETECTED Final   Streptococcus agalactiae NOT DETECTED NOT DETECTED Final   Streptococcus pneumoniae NOT DETECTED NOT DETECTED Final   Streptococcus pyogenes NOT DETECTED NOT DETECTED Final   Acinetobacter baumannii NOT DETECTED NOT DETECTED Final   Enterobacteriaceae species NOT DETECTED NOT DETECTED Final   Enterobacter cloacae complex NOT DETECTED NOT DETECTED Final   Escherichia coli NOT DETECTED NOT DETECTED Final   Klebsiella oxytoca NOT DETECTED NOT DETECTED Final   Klebsiella pneumoniae NOT DETECTED NOT DETECTED Final   Proteus species NOT DETECTED NOT DETECTED Final   Serratia marcescens NOT DETECTED NOT DETECTED Final   Haemophilus influenzae NOT DETECTED NOT DETECTED Final   Neisseria meningitidis NOT DETECTED NOT DETECTED Final   Pseudomonas aeruginosa NOT DETECTED NOT DETECTED Final   Candida albicans NOT DETECTED NOT DETECTED Final   Candida glabrata NOT DETECTED NOT DETECTED Final   Candida krusei NOT DETECTED NOT DETECTED Final   Candida parapsilosis NOT DETECTED NOT DETECTED Final   Candida tropicalis NOT DETECTED NOT DETECTED Final    Comment: Performed at [facility], [place]. [address] [place], [place] [address]  MRSA PCR Screening     Status: Abnormal   Collection Time: 05/22/17  9:40 PM  Result Value Ref Range Status   MRSA by PCR POSITIVE (A) NEGATIVE Final    Comment:          The GeneXpert MRSA Assay (FDA approved for NASAL specimens only), is one component of a comprehensive MRSA colonization surveillance program. It is not intended to diagnose MRSA infection nor to guide or monitor treatment for MRSA infections. RESULT CALLED TO, READ BACK BY AND VERIFIED WITH: MCGIBBONY C. AT 0932A ON [id number] BY THOMPSON S.     Radiology Reports Dg Chest Portable 1 View  Result Date: 05/22/2017 CLINICAL DATA:  Hypoxemia, increased mucus secretions, fever this morning, history atrial fibrillation, CHF, hypertension, NSTEMI, former smoker, prior stroke EXAM: PORTABLE CHEST 1 VIEW COMPARISON:  Portable exam 1727 hours compared to 04/04/2017 FINDINGS: Normal heart size and mediastinal contours. Atherosclerotic calcification aorta. Slightly decreased lung volumes versus previous exam with bibasilar opacities, favoring infiltrate/pneumonia at RIGHT base and either atelectasis  or infiltrate at LEFT base. Upper lungs clear. No pleural effusion or pneumothorax. Bones demineralized. IMPRESSION: RIGHT basilar infiltrate consistent with pneumonia. Additional infiltrate versus atelectasis at LEFT base. Electronically Signed   By: Lavonia Dana M.D.   On: 05/22/2017 17:48    Time Spent in minutes  25   Kelen Laura M.D on 05/27/2017 at 2:18 PM  Between 7am to 7pm - Pager - [phone]  After 7pm go to [email or web address] - password [facility]  [facility] -  Office  [phone]

## 2017-05-27 NOTE — Progress Notes (Signed)
Patient coughing drowning in secretions will not allow to suction even with yanker. Will help as much as patient will allow. What sputum he has is green to yellow.

## 2017-05-28 LAB — CULTURE, BLOOD (ROUTINE X 2): Special Requests: ADEQUATE

## 2017-05-28 MED ORDER — FUROSEMIDE 10 MG/ML IJ SOLN
40.0000 mg | Freq: Once | INTRAMUSCULAR | Status: AC
  Administered 2017-05-28: 40 mg via INTRAVENOUS
  Filled 2017-05-28: qty 4

## 2017-05-28 NOTE — Progress Notes (Signed)
PROGRESS NOTE                                                                                                                                                                                                             Patient Demographics:    Lance Kemp, is a 83 y.o. male, DOB - May 07, 1923, ZHG:[id number]  Admit date - 05/22/2017   Admitting Physician Reubin Milan, MD  Outpatient Primary MD for the patient is McGowen, [name], MD  LOS - 6  Outpatient Specialists:none  Chief Complaint  Patient presents with  . Respiratory Distress       Brief Narrative   83 y/o male with History of paroxysmal A. Fib of basal cell carcinoma, diastolic CHF, chronic back pain, CKD stage III, history of CVA and dementia presented from SNF with Fever and shortness of breath. In the ED patient was in acute hypoxic respiratory failure Requiring nonrebreather and severe sepsis with fever, tachycardia, tachypnea, Hypotension, Elevated lactic acid, mildly elevated INR, hypernatremia and acute kidney injury. Chest x-ray showed right basilar infiltrate. Patient admitted to stepdown unit for severe sepsis with acute hypoxic respiratory failure secondary to right lobar pneumonia. Blood culture admission growing MRSA bacteremia.   Subjective:   Remains Restless and has hacking cough.  Assessment  & Plan :    Principal Problem:  Severe sepsis ([place])   Sepsis due to pneumonia ([place]) Associated with MRSA bacteremia. Now goal for comfort And plan on discharge today gentle hospice once bed available. Discussed in detail with son again On the phone today and he agrees with comfort measures. IV morphine when necessary for comfort. Ativan when necessary for agitation. Will discontinue antibiotics.  Active Problems: ? Aspiration pneumonia Comfort feed if tolerated.  Dementia with acute metabolic encephalopathy Remains confused and  restless.  Hypotension Currently stable. Off home blood pressure medications.    Glaucoma Continue home medications.  Hyponatremia Secondary to dehydration Received IV fluids Initially, discontinued with congestion on exam.  Acute kidney injury on chronic kidney disease stage III Secondary to sepsis and dehydration.  BPH    Goals of care Guarded prognosis. Plan on residential hospice. Discussed in detail with son on the phone who agrees with the plan.     Code Status : DO NOT RESUSCITATE  Family Communication  : Discussed with patient's son on the  phone on On 12/5 and 12/10.   Disposition Plan  : Residential hospice  Barriers For Discharge : Awaiting residential hospice bed   Consults  :   Palliative care  Procedures  : None  DVT Prophylaxis  :  Heparin  Lab Results  Component Value Date   PLT 179 05/26/2017    Antibiotics  :    Anti-infectives (From admission, onward)   Start     Dose/Rate Route Frequency Ordered Stop   05/23/17 2200  vancomycin (VANCOCIN) IVPB 750 mg/150 ml premix     750 mg 150 mL/hr over 60 Minutes Intravenous Every 24 hours 05/22/17 2155     05/23/17 2000  ceFEPIme (MAXIPIME) 1 g in dextrose 5 % 50 mL IVPB  Status:  Discontinued     1 g 100 mL/hr over 30 Minutes Intravenous Every 24 hours 05/22/17 2155 05/28/17 0640   05/22/17 1715  ceFEPIme (MAXIPIME) 2 g in dextrose 5 % 50 mL IVPB     2 g 100 mL/hr over 30 Minutes Intravenous  Once 05/22/17 1711 05/22/17 1958   05/22/17 1715  vancomycin (VANCOCIN) IVPB 1000 mg/200 mL premix     1,000 mg 200 mL/hr over 60 Minutes Intravenous  Once 05/22/17 1711 05/22/17 1914        Objective:   Vitals:   05/27/17 2042 05/28/17 0614 05/28/17 1028 05/28/17 1427  BP: 112/84 (!) 112/43  91/64  Pulse: (!) 101 (!) 56    Resp: 18 (!) 22  20  Temp: 97.8 F (36.6 C) 98 F (36.7 C)  97.8 F (36.6 C)  TempSrc: Axillary Axillary  Axillary  SpO2: 93% 95% 96% 96%  Weight:      Height:         Wt Readings from Last 3 Encounters:  05/22/17 59.9 kg (132 lb 0.9 oz)  04/16/17 61.3 kg (135 lb 1.6 oz)  04/06/17 61.2 kg (134 lb 14.4 oz)     Intake/Output Summary (Last 24 hours) at 05/28/2017 1645 Last data filed at 05/28/2017 0900 Gross per 24 hour  Intake 200 ml  Output 600 ml  Net -400 ml     Physical Exam Elderly male lying in bed, confused , Has coarse cough HEENT:Moist mucosa, supple neck Chest: Coarse breath sounds bilaterally CVS: Normal S1 and S2 GI: Soft, nondistended, nontender Musculoskeletal: Warm, no edema       Data Review:    CBC Recent Labs  Lab 05/22/17 1647 05/23/17 0404 05/24/17 0536 05/25/17 1153 05/26/17 0656  WBC 9.5 8.5 11.7* 8.5 9.9  HGB 10.6* 9.4* 9.7* 10.4* 11.2*  HCT 34.0* 30.2* 30.1* 31.3* 35.6*  PLT 257 237 181 153 179  MCV 96.9 97.1 95.0 96.0 95.7  MCH 30.2 30.2 30.6 31.9 30.1  MCHC 31.2 31.1 32.2 33.2 31.5  RDW 14.7 14.8 14.4 14.5 14.5  LYMPHSABS 0.7 0.9 0.8 0.6* 1.1  MONOABS 0.4 0.3 0.4 0.4 0.9  EOSABS 0.0 0.0 0.0 0.0 0.1  BASOSABS 0.0 0.0 0.0 0.0 0.0    Chemistries  Recent Labs  Lab 05/22/17 1647 05/23/17 0404 05/24/17 0536 05/25/17 1153 05/26/17 0656  NA 149* 148* 147* 148* 151*  K 3.9 3.8 4.1 4.0 3.7  CL 111 114* 114* 117* 119*  CO2 29 27 27 27 25   GLUCOSE 116* 229* 141* 97 83  BUN 30* 36* 42* 46* 40*  CREATININE 1.84* 1.72* 1.34* 1.13 1.06  CALCIUM 8.2* 7.5* 8.1* 8.4* 8.2*  AST 29  --   --   --   --  ALT 15*  --   --   --   --   ALKPHOS 95  --   --   --   --   BILITOT 0.9  --   --   --   --    ------------------------------------------------------------------------------------------------------------------ No results for input(s): CHOL, HDL, LDLCALC, TRIG, CHOLHDL, LDLDIRECT in the last 72 hours.  Lab Results  Component Value Date   HGBA1C 5.5 04/16/2017   ------------------------------------------------------------------------------------------------------------------ No results for  input(s): TSH, T4TOTAL, T3FREE, THYROIDAB in the last 72 hours.  Invalid input(s): FREET3 ------------------------------------------------------------------------------------------------------------------ No results for input(s): VITAMINB12, FOLATE, FERRITIN, TIBC, IRON, RETICCTPCT in the last 72 hours.  Coagulation profile Recent Labs  Lab 05/22/17 1647  INR 1.42    No results for input(s): DDIMER in the last 72 hours.  Cardiac Enzymes No results for input(s): CKMB, TROPONINI, MYOGLOBIN in the last 168 hours.  Invalid input(s): CK ------------------------------------------------------------------------------------------------------------------    Component Value Date/Time   BNP 194.0 (H) 02/11/2017 1933    Inpatient Medications  Scheduled Meds: . ketotifen  2 drop Both Eyes QHS  . levobunolol  1 drop Both Eyes BID  . sodium chloride flush  3 mL Intravenous Q12H   Continuous Infusions: . sodium chloride    . vancomycin Stopped (05/27/17 2335)   PRN Meds:.sodium chloride, acetaminophen, acetaminophen, guaiFENesin, levalbuterol, morphine injection, ondansetron **OR** ondansetron (ZOFRAN) IV, polyethylene glycol, sodium chloride flush  Micro Results Recent Results (from the past 240 hour(s))  Culture, blood (Routine x 2)     Status: None   Collection Time: 05/22/17  5:26 PM  Result Value Ref Range Status   Specimen Description BLOOD RIGHT HAND  Final   Special Requests   Final    BOTTLES DRAWN AEROBIC AND ANAEROBIC Blood Culture adequate volume   Culture NO GROWTH 5 DAYS  Final   Report Status 05/27/2017 FINAL  Final  Culture, blood (Routine x 2)     Status: Abnormal   Collection Time: 05/22/17  5:26 PM  Result Value Ref Range Status   Specimen Description LEFT ANTECUBITAL  Final   Special Requests   Final    BOTTLES DRAWN AEROBIC AND ANAEROBIC Blood Culture adequate volume   Culture  Setup Time   Final    GRAM POSITIVE COCCI AEROBIC BOTTLE ONLY Gram Stain Report  Called to,Read Back By and Verified With: CAPMPOS,E AT [place] ON 12.5.2018 BY ISLEY,B Performed at [facility], READ BACK BY AND VERIFIED WITH: Clarene Essex RN [id number] 05/24/17 A BROWNING    Culture (A)  Final    METHICILLIN RESISTANT STAPHYLOCOCCUS AUREUS STAPHYLOCOCCUS SPECIES (COAGULASE NEGATIVE) THE SIGNIFICANCE OF ISOLATING THIS ORGANISM FROM A SINGLE SET OF BLOOD CULTURES WHEN MULTIPLE SETS ARE DRAWN IS UNCERTAIN. PLEASE NOTIFY THE MICROBIOLOGY DEPARTMENT WITHIN ONE WEEK IF SPECIATION AND SENSITIVITIES ARE REQUIRED. Performed at [facility], [place] [address], [place], [place] [address]    Report Status 05/28/2017 FINAL  Final   Organism ID, Bacteria METHICILLIN RESISTANT STAPHYLOCOCCUS AUREUS  Final      Susceptibility   Methicillin resistant staphylococcus aureus - MIC*    CIPROFLOXACIN >=8 RESISTANT Resistant     ERYTHROMYCIN <=0.25 SENSITIVE Sensitive     GENTAMICIN <=0.5 SENSITIVE Sensitive     OXACILLIN >=4 RESISTANT Resistant     TETRACYCLINE <=1 SENSITIVE Sensitive     VANCOMYCIN 1 SENSITIVE Sensitive     TRIMETH/SULFA >=320 RESISTANT Resistant     CLINDAMYCIN <=0.25 SENSITIVE Sensitive     RIFAMPIN <=0.5  SENSITIVE Sensitive     Inducible Clindamycin NEGATIVE Sensitive     * METHICILLIN RESISTANT STAPHYLOCOCCUS AUREUS  Blood Culture ID Panel (Reflexed)     Status: Abnormal   Collection Time: 05/22/17  5:26 PM  Result Value Ref Range Status   Enterococcus species NOT DETECTED NOT DETECTED Final   Listeria monocytogenes NOT DETECTED NOT DETECTED Final   Staphylococcus species DETECTED (A) NOT DETECTED Final    Comment: CRITICAL RESULT CALLED TO, READ BACK BY AND VERIFIED WITH: Clarene Essex RN [phone] 05/24/17 A BROWNING    Staphylococcus aureus DETECTED (A) NOT DETECTED Final    Comment: Methicillin (oxacillin)-resistant Staphylococcus aureus (MRSA). MRSA is predictably resistant to beta-lactam antibiotics (except ceftaroline). Preferred therapy  is vancomycin unless clinically contraindicated. Patient requires contact precautions if  hospitalized. CRITICAL RESULT CALLED TO, READ BACK BY AND VERIFIED WITH: Clarene Essex RN [id number] 05/24/17 A BROWNING    Methicillin resistance DETECTED (A) NOT DETECTED Final    Comment: CRITICAL RESULT CALLED TO, READ BACK BY AND VERIFIED WITH: Clarene Essex RN [phone] 05/24/17 A BROWNING    Streptococcus species NOT DETECTED NOT DETECTED Final   Streptococcus agalactiae NOT DETECTED NOT DETECTED Final   Streptococcus pneumoniae NOT DETECTED NOT DETECTED Final   Streptococcus pyogenes NOT DETECTED NOT DETECTED Final   Acinetobacter baumannii NOT DETECTED NOT DETECTED Final   Enterobacteriaceae species NOT DETECTED NOT DETECTED Final   Enterobacter cloacae complex NOT DETECTED NOT DETECTED Final   Escherichia coli NOT DETECTED NOT DETECTED Final   Klebsiella oxytoca NOT DETECTED NOT DETECTED Final   Klebsiella pneumoniae NOT DETECTED NOT DETECTED Final   Proteus species NOT DETECTED NOT DETECTED Final   Serratia marcescens NOT DETECTED NOT DETECTED Final   Haemophilus influenzae NOT DETECTED NOT DETECTED Final   Neisseria meningitidis NOT DETECTED NOT DETECTED Final   Pseudomonas aeruginosa NOT DETECTED NOT DETECTED Final   Candida albicans NOT DETECTED NOT DETECTED Final   Candida glabrata NOT DETECTED NOT DETECTED Final   Candida krusei NOT DETECTED NOT DETECTED Final   Candida parapsilosis NOT DETECTED NOT DETECTED Final   Candida tropicalis NOT DETECTED NOT DETECTED Final    Comment: Performed at [facility], [place]. [address] [place], [place] [address]  MRSA PCR Screening     Status: Abnormal   Collection Time: 05/22/17  9:40 PM  Result Value Ref Range Status   MRSA by PCR POSITIVE (A) NEGATIVE Final    Comment:        The GeneXpert MRSA Assay (FDA approved for NASAL specimens only), is one component of a comprehensive MRSA colonization surveillance program. It is not intended to diagnose  MRSA infection nor to guide or monitor treatment for MRSA infections. RESULT CALLED TO, READ BACK BY AND VERIFIED WITH: MCGIBBONY C. AT [id number] ON [id number] BY Biagio Borg.     Radiology Reports Dg Chest Port 1 View  Result Date: 05/27/2017 CLINICAL DATA:  Central catheter placement EXAM: PORTABLE CHEST 1 VIEW COMPARISON:  May 22, 2017 FINDINGS: Central catheter tip is at the cavoatrial junction. No pneumothorax. There is persistent patchy infiltrate in each lung base consistent with pneumonia. There is slight increase in left base consolidation without change on the right. Heart size is normal. No adenopathy. Pulmonary vascularity is normal. There is aortic atherosclerosis. There is degenerative change in each shoulder. IMPRESSION: Central catheter tip at cavoatrial junction without pneumothorax. Patchy consolidation in both lung bases felt to represent pneumonia. These changes are stable on the right with slight increase  in consolidation in the left base. There is aortic atherosclerosis. Aortic Atherosclerosis (ICD10-I70.0). Electronically Signed   By: Lowella Grip III M.D.   On: 05/27/2017 21:26   Dg Chest Portable 1 View  Result Date: 05/22/2017 CLINICAL DATA:  Hypoxemia, increased mucus secretions, fever this morning, history atrial fibrillation, CHF, hypertension, NSTEMI, former smoker, prior stroke EXAM: PORTABLE CHEST 1 VIEW COMPARISON:  Portable exam 1727 hours compared to 04/04/2017 FINDINGS: Normal heart size and mediastinal contours. Atherosclerotic calcification aorta. Slightly decreased lung volumes versus previous exam with bibasilar opacities, favoring infiltrate/pneumonia at RIGHT base and either atelectasis or infiltrate at LEFT base. Upper lungs clear. No pleural effusion or pneumothorax. Bones demineralized. IMPRESSION: RIGHT basilar infiltrate consistent with pneumonia. Additional infiltrate versus atelectasis at LEFT base. Electronically Signed   By: Lavonia Dana M.D.   On:  05/22/2017 17:48    Time Spent in minutes  20   Eldra Word M.D on 05/28/2017 at 4:45 PM  Between 7am to 7pm - Pager - [phone]  After 7pm go to [email or web address] - password [facility]  [facility] -  Office  [phone]

## 2017-05-28 NOTE — Progress Notes (Signed)
  Speech Language Pathology Treatment: Dysphagia  Patient Details Name: Lance Kemp MRN: [id number] DOB: 1922/08/21 Today's Date: 05/28/2017 Time: 1448-1856 SLP Time Calculation (min) (ACUTE ONLY): 25 min  Assessment / Plan / Recommendation Clinical Impression  Pt seen at bedside for ongoing diagnostic dysphagia intervention. Pt appears very pale, has audible congestion, and is xerostomic. Oral care attempted, however extremely difficult due to severity of dried secretions (Pt previously refused to allow RN to perform oral care). Pt unable to voice at this time and he was able to talk with me on Thursday. Pt nodded his head when asked if he were in pain. He appears weak and uncomfortable and inappropriate for po trials. RN gave IV pain meds during my visit. He is inappropriate for po trials at this time due to the severity of his decline. SLP did ask if he wanted ice chips, food, or water and he shook his head "no". Strongly recommend pain control at this time. SLP will sign off. Please reconsult if he becomes appropriate. Family not present.   HPI HPI: 83 y/o male with History of paroxysmal A. Fib of basal cell carcinoma, diastolic CHF, chronic back pain, CKD stage III, history of CVA and dementia presented from SNF with Fever and shortness of breath. In the ED patient was in acute hypoxic respiratory failure Requiring nonrebreather and severe sepsis with fever, tachycardia, tachypnea, Hypotension, Elevated lactic acid, mildly elevated INR, hypernatremia and acute kidney injury. Chest x-ray showed right basilar infiltrate. Patient admitted to stepdown unit for severe sepsis with acute hypoxic respiratory failure secondary to right lobar pneumonia. BSE requested.      SLP Plan  Discharge SLP treatment due to (comment)       Recommendations                   Oral Care Recommendations: Oral care QID;Oral care prior to ice chip/H20;Oral care before and after PO Plan: Discharge SLP  treatment due to (comment)       Thank you,  [name], [place]                 [place] 05/28/2017, 4:03 PM

## 2017-05-28 NOTE — Clinical Social Work Note (Signed)
LCSW attempted to contact an alternate number found in the chart [phone]) to discuss Hospice Home placement.   LCSW received no answer but left call back number.     Quaron Delacruz, Clydene Pugh, LCSW

## 2017-05-29 LAB — BASIC METABOLIC PANEL
Anion gap: 13 (ref 5–15)
BUN: 57 mg/dL — AB (ref 6–20)
CHLORIDE: 126 mmol/L — AB (ref 101–111)
CO2: 21 mmol/L — AB (ref 22–32)
CREATININE: 3.02 mg/dL — AB (ref 0.61–1.24)
Calcium: 8.1 mg/dL — ABNORMAL LOW (ref 8.9–10.3)
GFR calc Af Amer: 19 mL/min — ABNORMAL LOW (ref >60)
GFR calc non Af Amer: 17 mL/min — ABNORMAL LOW (ref >60)
GLUCOSE: 109 mg/dL — AB (ref 65–99)
Potassium: 3.6 mmol/L (ref 3.5–5.1)
SODIUM: 160 mmol/L — AB (ref 135–145)

## 2017-05-29 MED ORDER — ACETAMINOPHEN 500 MG PO TABS: 500.0000 mg | ORAL_TABLET | ORAL | Status: DC | PRN

## 2017-05-29 MED ORDER — GLYCOPYRROLATE 0.2 MG/ML IJ SOLN: 0.1000 mg | Freq: Four times a day (QID) | INTRAMUSCULAR | Status: DC | PRN

## 2017-05-29 MED ORDER — MORPHINE SULFATE (CONCENTRATE) 10 MG/0.5ML PO SOLN: 2.5000 mg | ORAL | Status: DC | PRN

## 2017-05-29 MED ORDER — BISACODYL 10 MG RE SUPP: 10.0000 mg | Freq: Every day | RECTAL | Status: DC | PRN

## 2017-05-29 NOTE — Consult Note (Signed)
   [facility] Inpatient Consult   05/29/2017  Lance Kemp 1922-10-25 [id number]   Patient screened for potential [facility] services. Patient is on the [facility] registry as a benefit of their Next Gen medicare . Electronic medical record reveals patient's discharge plan is Hospice. [facility] Care Management services not appropriate at this time. If patient's post hospital needs change please place a [facility] Care Management consult. For questions please contact:   Iridessa Harrow RN, [facility] Liaison  [phone]) Business Mobile [phone]) Toll free office

## 2017-05-29 NOTE — Clinical Social Work Note (Signed)
[facility] patient to [facility] tomorrow.     Skyeler Smola, Clydene Pugh, LCSW

## 2017-05-29 NOTE — Care Management Note (Signed)
Case Management Note  Patient Details  Name: Lance Kemp MRN: [id number] Date of Birth: Mar 25, 1923  If discussed at Long Length of Stay Meetings, dates discussed:  05/29/2017   Sherald Barge, RN 05/29/2017, 2:38 PM

## 2017-05-29 NOTE — Clinical Social Work Note (Signed)
Patient Information   Patient Name Lance Kemp, Lance Kemp ([id number]) Sex Male DOB 02/21/1923 SSN [id number]   Room Bed  A334 A334-01  Patient Demographics   Address [place] [address] Phone [phone] (Home) E-mail Address [email or web address]  Patient Ethnicity & Race   Ethnic Group Patient Race  Not Hispanic or Latino White or Caucasian  Emergency Contact(s)   Name Relation Home Work Mobile  Rodriguez,Chris Son [phone]    Dowlen,Rebecca Relative   [phone]  Documents on File    Status Date Received Description  Documents for the Patient  [place] E-Signature HIPAA Notice of Privacy Received 03/26/13   [place] E-Signature HIPAA Notice of Privacy Spanish Received 03/17/13   [place] HIPAA NOTICE OF PRIVACY - Scanned Not Received    Driver's License Not Received    Insurance Card Not Received    Advance Directives/Living Will/HCPOA/POA Not Received    Advanced Beneficiary Notice (ABN) Received 03/26/15 ABN-debridement DOS 03/25/2015 -[facility] [place]  Insurance Card Received 03/26/13 mcr and aarp tre [employer] Card  09/04/13   Release of Information  09/12/13   Release of Information  10/03/13   AMB Correspondence  11/19/13 CLINICAL SUMMARY [facility]  AMB HH/NH/Hospice  11/25/13 FL-2 [place]  AMB HH/NH/Hospice  01/15/14 TUBERCULIN TEST [place]   AMB Provider Completed Forms  11/25/13 ORDERS [place]  AMB Correspondence  10/09/13 POST-OP WOUND CARE [facility]  AMB HH/NH/Hospice  10/10/13 ORDER [facility]  AMB Correspondence  01/23/14 CONTINUITY OF CARE RECORD [place]  HIM ROI Authorization  02/24/14 [facility]  AMB HH/NH/Hospice  32/44/01 CERT/POC [place]  AMB HH/NH/Hospice  01/08/14 ORDER [facility]  AMB Provider Completed Forms  01/28/14 IMMUNIZATION/VACCINE ORDER [facility]  AMB HH/NH/Hospice  02/72/53  HOME HEALTH CERTIFICATION POC [facility]  AMB HH/NH/Hospice  12/25/13 FAX ORDER [facility]  AMB HH/NH[facility]Hospice  02/24/14 FACE TO [place]  AMB HH/NH/Hospice  03/27/14 10/15 PHYSICIANS ORDERS [facility]  AMB HH/NH/Hospice  03/30/14 FAX PHYSICIAN ORDER SHEET [facility]  AMB HH/NH/Hospice  04/07/14 ORDER [place]  AMB HH/NH/Hospice  04/14/14 PHYSICIAN VERBAL ORDER [place] W-S  AMB HH/NH/Hospice  04/03/14 PERSONAL SERVICE PLAN/ ADDENDU  [facility]  AMB HH[facility]Hospice  04/07/14 PHYSICIAN VERBAL ORDER [place]- WIN  AMB HH/NH/Hospice  04/24/14 EPISODE SUMMARY [place]  AMB HH/NH/Hospice  04/23/14 ORDER [place]  Release of Information  05/15/14   AMB HH/NH/Hospice  04/25/14 POC [place]  AMB Provider Completed Forms  05/12/14 ORDER [facility]  AMB HH/NH/Hospice  05/12/14 ORDER [place]  AMB HH/NH/Hospice  05/20/14 ORDER REQUEST [facility]  AMB [facility]  05/19/14 PHYS. VERBAL ORDER [facility] [place]  AMB HH/NH/Hospice  05/18/14 ORDER [place]  AMB HH/NH/Hospice  06/10/14 ORDER [place]  AMB HH/NH/Hospice  06/23/14 EPISODE SUMMARY REPORT [facility]  AMB HH/NH/Hospice  06/24/14 OFFICE NOTE [place] [facility]  AMB HH/NH/Hospice  06/22/14 ORDER [facility]  AMB Provider Completed Forms  06/17/14 ORDERS [facility]  AMB HH/NH/Hospice  07/16/14 ORDERS [facility] [place]  AMB Outside Consult Note  07/15/14 [facility]  AMB Provider Completed Forms  04/16/14 IMMUNIZATION/VACCINE Meryl Crutch MD, P  Insurance Card Received 08/24/14 mc and bcbs  AMB HH/NH/Hospice  08/13/14 ORDER [facility]  AMB HH/[facility]Hospice  08/13/14 ORDER [facility]  AMB Provider Completed Forms  08/13/14 ORDER [facility]  AMB  HH/NH/Hospice  06/24/14 EPISODE SUMMARY REPORT [place]  AMB Provider Completed Forms  02/16/14 ORDER [facility]  Other Photo ID Not Received    AMB HH/NH/Hospice  02/25/14 REQUEST [facility]  AMB Provider Completed Forms  09/02/14 ORDER [facility]  AMB HH[facility]Hospice  08/20/14 SPEECH/SLP EVALUATION/POC [facility]  AMB HH/NH[facility]Hospice  08/28/14 [facility]DERS [facility]  AMB HH/NH/Hospice  02/25/14 ORDERS [place] [facility]  AMB Patient Logs/Info  09/19/14   AMB Correspondence  09/19/14 MINI-MENTAL STATE EXAMINATION  AMB HH/NH/Hospice  09/14/14 OFFICE NOTE [facility]  AMB HH/NH/Hospice  08/19/14 03/16-05/16 PT EVAL & POC [facility]  AMB HH/NH/Hospice  10/02/14 FL2 VERIFICATION  AMB HH/NH/Hospice  02/18/14 NOTE [facility]  AMB HH/NH/Hospice  02/24/14 VERBAL ORDER [facility]  AMB HH/NH/Hospice  09/29/14 ORDER [facility] [place]  AMB HH/NH/Hospice  10/17/14 FAX/PATIENT INCIDENT [facility]  AMB Correspondence  55/73/22 CERTIFICATION/POC [place]  AMB Correspondence  10/01/14 FACE TO [place]  AMB HH/NH/Hospice  09/30/14 PHYSICIAN ORDER [facility]  AMB Correspondence  11/02/14 OFFICIAL JURY SUMMONS PEGRAM CLERK OF SUPERIOR COU  AMB Correspondence  11/10/14 WALK IN PATIENTS  E-Signature AOB Spanish Not Received    AMB HH/NH/Hospice  12/08/14 EPISODE SUMMARY REPORT [place]  AMB Provider Completed Forms  01/24/15 ORDER [facility]  AMB HH/NH/Hospice  01/18/15 ORDER SUMMARY REPORT [facility] [place]  AMB Provider Completed Forms  02/25/15 IMMUNIZATION/VACCINE ORDERS [facility] MD  AMB Correspondence  03/22/15 REFERRAL HAVERSTOCK MD, C.  AMB HH/NH/Hospice  02/09/15 PHYSICIAN ORDER [facility]  AMB HH/NH/Hospice  02/09/15 ORDER/REPORT [facility]  AMB Correspondence  05/26/15 MMSE EXAMINATION [place]  AMB  Correspondence  05/27/15 [facility]  AMB Correspondence  07/16/15 FL2 [place] MEDICAID PROGRAM LONG TERM CARE SERVICES  AMB HH/NH/Hospice  07/07/15 FL2 [facility]  HIM ROI Authorization  08/09/15 [facility]  AMB HH/NH/Hospice  07/23/15 OFFICE NOTE FAX COVER SHEET [facility]  HIM ROI Authorization  08/12/15 [facility]  AMB Provider Completed Forms  02/54/27 CERTIFICATION/FACE-TO-FACE ENCOUNTER [facility]  AMB HH/NH/Hospice  12/10/74 CERTIFICATION POC [place]  AMB HH/NH/Hospice  07/28/15 PHYSICIAN VERBAL ORDER [facility]  AMB Correspondence  08/11/15 RX [facility]  AMB New Patient Records/Historical  08/31/15 REFERRAL HAVERSTOCK MD, C  AMB HH/NH/Hospice  08/09/15 MISSED VISIT REPORT [facility]  AMB HH/NH/Hospice  08/12/15 ORDER [place]  Advanced Beneficiary Notice (ABN) Received 09/07/15 [facility] ABN  AMB HH/NH/Hospice  07/29/15 VISIT NOTE REPORT [place]  AMB Provider Completed Forms  09/27/15 ORDER [facility]  AMB HH/NH/Hospice  07/29/15 EPISODE SUMMARY REPORT [place]  HIM ROI Authorization  10/27/15 [facility]  AMB HH/NH/Hospice  08/09/15 EPISODE SUMMARY REPORT [place]  AMB HH/NH/Hospice  10/04/15 MEDICATION REVIEW REPORT [facility] [place]  AMB HH/NH[facility]Hospice  12/29/15 ORDER [facility]  DNR (Do Not Resuscitate) Documentation  02/08/16   AMB Provider Completed Forms  03/13/16 ORDER [facility]  AMB HH/NH/Hospice  03/31/16 [place] PSA/PSP [facility]  AMB HH/NH/Hospice  03/29/16 ORDER SUMMARY [facility]  AMB HH/NH[facility]Hospice  03/29/16 ORDER SUMMARY REPORT [facility]  AMB Provider Completed Forms  05/01/16 ORDER [place]  AMB Provider Completed Forms  05/02/16 ORDER [employer]  [place]  05/18/16 [place]  AMB HH/NH/Hospice   05/05/16 ADDENDUM/FACE TO [place]  AMB HH/NH/Hospice  05/05/16 VISIT NOTE REPORT [place] [facility]  AMB HH/NH/Hospice  56/38/75 CERTIFICATION/POC [place]  AMB Provider Completed Forms  05/17/16 HEALTHCARE PROVIDER ORDER SHEET [facility]  AMB HH/NH/Hospice  05/05/16 EPISODE SUMMARY REPORT [place] WINST  AMB Correspondence  05/26/16 MINI-MENTAL STATE EXAMINATION [place] [facility]  AMB Provider Completed Forms  06/30/16 IMMUNIZATION/VACCINE ORDER [facility] MD  AMB HH/NH/Hospice  08/07/16 PROFESSIONAL COMMUNICATION [facility]  AMB HH/NH/Hospice  64/33/29 CERTIFICATION POC [facility]  Advanced Beneficiary Notice (ABN)   [facility] ABN-09/2016  AMB Provider Completed Forms  11/08/16 PROFESSIONAL COMMUNICATION [facility]  AMB HH/NH/Hospice  51/88/41 CERTIFICATION/POC [facility]  AMB HH/NH/Hospice  11/30/16 ORDER [facility]  AMB Outside Consult Note  12/12/16 [facility]  Release of Information Received 02/06/17 DPR 2018  Advanced Beneficiary Notice (ABN) Received 02/16/17 tfc-abn medicare 2018  AMB HH/NH/Hospice  02/25/17 PROFESSIONAL COMMUNCIATION [facility]  AMB HH/NH/Hospice  66/06/30 [place] CERTIFICATION & [place] E-Signature HIPAA Notice of Privacy Signed 04/04/17   AMB HH/NH/Hospice Received 04/10/17 PROFESSIONAL COMMUNICATION [facility]  AMB Correspondence Received 04/16/17 JURY SUMMONS  AMB HH/NH/Hospice Received 16/01/09 CERTIFICATION/POC [facility]  AMB HH/NH/Hospice (Deleted) 11/25/13 ORDERS [place]  AMB HH/NH/Hospice (Deleted) 01/15/14 ORDERS [place]  AMB HH/NH/Hospice (Deleted) 32/35/57 CERT/POC [place]  AMB Provider Completed Forms (Deleted) 03/30/14 FAX PHYSICIAN ORDER SHEET [facility]  AMB Correspondence (Deleted) 06/24/14 OFFICE NOTE  [facility] [place]  AMB HH/NH/Hospice (Deleted) 08/13/14 ORDER [facility]  AMB Provider Completed Forms (Deleted) 09/09/14 ORDER [facility]  AMB Correspondence (Deleted) 02/25/14 ORDERS [facility]  AMB HH/NH/Hospice (Deleted) 09/30/14 03/16-05/16 PT EVAL & POC [facility]  AMB Provider Completed Forms (Deleted) 10/17/14 FAX/PATIENT INCIDENT [facility]  AMB Correspondence (Deleted) 32/20/25 CERTIFICATION [facility] [place]  AMB Correspondence (Deleted) 11/20/14 WALK IN PATIENTS  AMB HH/NH/Hospice (Deleted) 01/24/15 ORDER [facility]  AMB HH/NH/Hospice (Deleted) 03/20/16 ORDER [facility]  AMB Correspondence (Deleted) 05/01/16 [place]  Documents for the Encounter  AOB (Assignment of Insurance Benefits) Not Received    E-signature AOB Signed 05/22/17   MEDICARE RIGHTS Not Received    E-signature Medicare Rights Signed 05/22/17   ED Patient Billing Extract   ED PB Billing Extract  Cardiac Monitoring Strip Shift Summary Received 05/22/17   Cardiac Monitoring Strip Received 05/23/17   Correspondence Received 05/27/17   EKG Received (Deleted) 05/23/17   EKG Received 05/23/17   Admission Information   Attending Provider Admitting Provider Admission Type Admission Date/Time  Louellen Molder, MD Reubin Milan, MD Emergency 05/22/17 1615  Discharge Date Hospital Service Auth/Cert Status Service Area   Internal Medicine Incomplete [place]  Unit Room/Bed Admission Status   AP-DEPT 300 A334/A334-01 Admission (Confirmed)   Admission   Complaint  respiratory distress  Hospital Account   Name Acct ID Class Status Primary Coverage  Leone, [place] [id number] Inpatient Open MEDICARE - MEDICARE PART A AND B      Guarantor Account (for Hospital Account [id number])   Name Relation to Pt Service Area Active? Acct Type  Antony Haste Self [facility] Yes  Personal/Family  Address Phone    [address] [place], [place] [address] [phone])  Coverage Information (for Hospital Account [id number])   1. [place] PART A AND B   F/O Payor/Plan Precert #  MEDICARE/MEDICARE PART A AND B   Subscriber Subscriber #  Marquez, [place] [id number]  Address Phone  [address] [place], [place] [address]   2. AARP/AARP   F/O Payor/Plan Precert #  AARP/AARP   Subscriber Subscriber #  Avian, [place] [id number]  Address Phone  [address] [place] [address] [phone]

## 2017-05-29 NOTE — Progress Notes (Signed)
PROGRESS NOTE                                                                                                                                                                                                             Patient Demographics:    Lance Kemp, is a 83 y.o. male, DOB - 17-May-1923, HEN:[id number]  Admit date - 05/22/2017   Admitting Physician Reubin Milan, MD  Outpatient Primary MD for the patient is McGowen, [name], MD  LOS - 7  Outpatient Specialists:none  Chief Complaint  Patient presents with  . Respiratory Distress       Brief Narrative   83 y/o male with History of paroxysmal A. Fib of basal cell carcinoma, diastolic CHF, chronic back pain, CKD stage III, history of CVA and dementia presented from SNF with Fever and shortness of breath. In the ED patient was in acute hypoxic respiratory failure Requiring nonrebreather and severe sepsis with fever, tachycardia, tachypnea, Hypotension, Elevated lactic acid, mildly elevated INR, hypernatremia and acute kidney injury. Chest x-ray showed right basilar infiltrate. Patient admitted to stepdown unit for severe sepsis with acute hypoxic respiratory failure secondary to right lobar pneumonia. Blood culture admission growing MRSA bacteremia.   Subjective:   Symptoms unchanged from yesterday. Remains poorly communicative with hacking cough.  Assessment  & Plan :    Principal Problem:  Severe sepsis ([place])   Sepsis due to pneumonia ([place]) Associated with MRSA bacteremia. Goal for comfort and awaiting residential hospice.  Detailed discussion with the son who agrees with comfort measures. Discontinued IV antibiotics. Comfort feeds as tolerated. No further lab draws or tests.  When necessary IV morphine for comfort and  shortness of breath. Ativan when necessary for agitation/anxiety.  Active Problems: ? Aspiration pneumonia Comfort feed if  tolerated.  Dementia with acute metabolic encephalopathy Remains confused and restless.  Hypotension Off home blood pressure medications.   Glaucoma Continue home medications.  Hyponatremia Secondary to dehydration Received IV fluids Initially, discontinued with congestion on exam.  Acute kidney injury on chronic kidney disease stage III Secondary to sepsis and dehydration.      Goals of care Guarded prognosis. Awaiting residential hospice. Discussed in detail with son on the phone who agrees with the plan.     Code Status : DO NOT RESUSCITATE  Family Communication  : Discussed with  patient's son on the phone on On 12/5 and 12/10.   Disposition Plan  : Residential hospice  Barriers For Discharge : Awaiting residential hospice bed   Consults  :   Palliative care  Procedures  : None  DVT Prophylaxis  :  Comfort care  Lab Results  Component Value Date   PLT 179 05/26/2017    Antibiotics  :    Anti-infectives (From admission, onward)   Start     Dose/Rate Route Frequency Ordered Stop   05/23/17 2200  vancomycin (VANCOCIN) IVPB 750 mg/150 ml premix  Status:  Discontinued     750 mg 150 mL/hr over 60 Minutes Intravenous Every 24 hours 05/22/17 2155 05/28/17 1649   05/23/17 2000  ceFEPIme (MAXIPIME) 1 g in dextrose 5 % 50 mL IVPB  Status:  Discontinued     1 g 100 mL/hr over 30 Minutes Intravenous Every 24 hours 05/22/17 2155 05/28/17 0640   05/22/17 1715  ceFEPIme (MAXIPIME) 2 g in dextrose 5 % 50 mL IVPB     2 g 100 mL/hr over 30 Minutes Intravenous  Once 05/22/17 1711 05/22/17 1958   05/22/17 1715  vancomycin (VANCOCIN) IVPB 1000 mg/200 mL premix     1,000 mg 200 mL/hr over 60 Minutes Intravenous  Once 05/22/17 1711 05/22/17 1914        Objective:   Vitals:   05/28/17 1028 05/28/17 1427 05/28/17 2042 05/29/17 0627  BP:  91/64 97/69 (!) 135/98  Pulse:   94 85  Resp:  20 20 20   Temp:  44.3 F (15.4 C) 98.6 F (37 C) 98.2 F (36.8 C)  TempSrc:   Axillary Axillary Axillary  SpO2: 96% 96% 91% 90%  Weight:      Height:        Wt Readings from Last 3 Encounters:  05/22/17 59.9 kg (132 lb 0.9 oz)  04/16/17 61.3 kg (135 lb 1.6 oz)  04/06/17 61.2 kg (134 lb 14.4 oz)     Intake/Output Summary (Last 24 hours) at 05/29/2017 1351 Last data filed at 05/29/2017 0900 Gross per 24 hour  Intake 0 ml  Output 150 ml  Net -150 ml     Physical Exam Elderly male, confused, coarse cough Chest: Coarse breath sounds bilaterally  CVS: S1 and S2 tachycardic        Data Review:    CBC Recent Labs  Lab 05/22/17 1647 05/23/17 0404 05/24/17 0536 05/25/17 1153 05/26/17 0656  WBC 9.5 8.5 11.7* 8.5 9.9  HGB 10.6* 9.4* 9.7* 10.4* 11.2*  HCT 34.0* 30.2* 30.1* 31.3* 35.6*  PLT 257 237 181 153 179  MCV 96.9 97.1 95.0 96.0 95.7  MCH 30.2 30.2 30.6 31.9 30.1  MCHC 31.2 31.1 32.2 33.2 31.5  RDW 14.7 14.8 14.4 14.5 14.5  LYMPHSABS 0.7 0.9 0.8 0.6* 1.1  MONOABS 0.4 0.3 0.4 0.4 0.9  EOSABS 0.0 0.0 0.0 0.0 0.1  BASOSABS 0.0 0.0 0.0 0.0 0.0    Chemistries  Recent Labs  Lab 05/22/17 1647 05/23/17 0404 05/24/17 0536 05/25/17 1153 05/26/17 0656 05/29/17 0525  NA 149* 148* 147* 148* 151* 160*  K 3.9 3.8 4.1 4.0 3.7 3.6  CL 111 114* 114* 117* 119* 126*  CO2 29 27 27 27 25  21*  GLUCOSE 116* 229* 141* 97 83 109*  BUN 30* 36* 42* 46* 40* 57*  CREATININE 1.84* 1.72* 1.34* 1.13 1.06 3.02*  CALCIUM 8.2* 7.5* 8.1* 8.4* 8.2* 8.1*  AST 29  --   --   --   --   --  ALT 15*  --   --   --   --   --   ALKPHOS 95  --   --   --   --   --   BILITOT 0.9  --   --   --   --   --    ------------------------------------------------------------------------------------------------------------------ No results for input(s): CHOL, HDL, LDLCALC, TRIG, CHOLHDL, LDLDIRECT in the last 72 hours.  Lab Results  Component Value Date   HGBA1C 5.5 04/16/2017    ------------------------------------------------------------------------------------------------------------------ No results for input(s): TSH, T4TOTAL, T3FREE, THYROIDAB in the last 72 hours.  Invalid input(s): FREET3 ------------------------------------------------------------------------------------------------------------------ No results for input(s): VITAMINB12, FOLATE, FERRITIN, TIBC, IRON, RETICCTPCT in the last 72 hours.  Coagulation profile Recent Labs  Lab 05/22/17 1647  INR 1.42    No results for input(s): DDIMER in the last 72 hours.  Cardiac Enzymes No results for input(s): CKMB, TROPONINI, MYOGLOBIN in the last 168 hours.  Invalid input(s): CK ------------------------------------------------------------------------------------------------------------------    Component Value Date/Time   BNP 194.0 (H) 02/11/2017 1933    Inpatient Medications  Scheduled Meds: . ketotifen  2 drop Both Eyes QHS  . levobunolol  1 drop Both Eyes BID  . sodium chloride flush  3 mL Intravenous Q12H   Continuous Infusions: . sodium chloride     PRN Meds:.sodium chloride, acetaminophen, acetaminophen, guaiFENesin, levalbuterol, morphine injection, ondansetron **OR** ondansetron (ZOFRAN) IV, polyethylene glycol, sodium chloride flush  Micro Results Recent Results (from the past 240 hour(s))  Culture, blood (Routine x 2)     Status: None   Collection Time: 05/22/17  5:26 PM  Result Value Ref Range Status   Specimen Description BLOOD RIGHT HAND  Final   Special Requests   Final    BOTTLES DRAWN AEROBIC AND ANAEROBIC Blood Culture adequate volume   Culture NO GROWTH 5 DAYS  Final   Report Status 05/27/2017 FINAL  Final  Culture, blood (Routine x 2)     Status: Abnormal   Collection Time: 05/22/17  5:26 PM  Result Value Ref Range Status   Specimen Description LEFT ANTECUBITAL  Final   Special Requests   Final    BOTTLES DRAWN AEROBIC AND ANAEROBIC Blood Culture adequate  volume   Culture  Setup Time   Final    GRAM POSITIVE COCCI AEROBIC BOTTLE ONLY Gram Stain Report Called to,Read Back By and Verified With: CAPMPOS,E AT [place] ON 12.5.2018 BY ISLEY,B Performed at [facility], READ BACK BY AND VERIFIED WITH: Clarene Essex RN [phone] 05/24/17 A BROWNING    Culture (A)  Final    METHICILLIN RESISTANT STAPHYLOCOCCUS AUREUS STAPHYLOCOCCUS SPECIES (COAGULASE NEGATIVE) THE SIGNIFICANCE OF ISOLATING THIS ORGANISM FROM A SINGLE SET OF BLOOD CULTURES WHEN MULTIPLE SETS ARE DRAWN IS UNCERTAIN. PLEASE NOTIFY THE MICROBIOLOGY DEPARTMENT WITHIN ONE WEEK IF SPECIATION AND SENSITIVITIES ARE REQUIRED. Performed at [facility], [place] [address] [place], [place] [address]    Report Status 05/28/2017 FINAL  Final   Organism ID, Bacteria METHICILLIN RESISTANT STAPHYLOCOCCUS AUREUS  Final      Susceptibility   Methicillin resistant staphylococcus aureus - MIC*    CIPROFLOXACIN >=8 RESISTANT Resistant     ERYTHROMYCIN <=0.25 SENSITIVE Sensitive     GENTAMICIN <=0.5 SENSITIVE Sensitive     OXACILLIN >=4 RESISTANT Resistant     TETRACYCLINE <=1 SENSITIVE Sensitive     VANCOMYCIN 1 SENSITIVE Sensitive     TRIMETH/SULFA >=320 RESISTANT Resistant     CLINDAMYCIN <=0.25 SENSITIVE Sensitive  RIFAMPIN <=0.5 SENSITIVE Sensitive     Inducible Clindamycin NEGATIVE Sensitive     * METHICILLIN RESISTANT STAPHYLOCOCCUS AUREUS  Blood Culture ID Panel (Reflexed)     Status: Abnormal   Collection Time: 05/22/17  5:26 PM  Result Value Ref Range Status   Enterococcus species NOT DETECTED NOT DETECTED Final   Listeria monocytogenes NOT DETECTED NOT DETECTED Final   Staphylococcus species DETECTED (A) NOT DETECTED Final    Comment: CRITICAL RESULT CALLED TO, READ BACK BY AND VERIFIED WITH: Clarene Essex RN [phone] 05/24/17 A BROWNING    Staphylococcus aureus DETECTED (A) NOT DETECTED Final    Comment: Methicillin (oxacillin)-resistant Staphylococcus aureus  (MRSA). MRSA is predictably resistant to beta-lactam antibiotics (except ceftaroline). Preferred therapy is vancomycin unless clinically contraindicated. Patient requires contact precautions if  hospitalized. CRITICAL RESULT CALLED TO, READ BACK BY AND VERIFIED WITH: Clarene Essex RN [phone] 05/24/17 A BROWNING    Methicillin resistance DETECTED (A) NOT DETECTED Final    Comment: CRITICAL RESULT CALLED TO, READ BACK BY AND VERIFIED WITH: Clarene Essex RN [phone] 05/24/17 A BROWNING    Streptococcus species NOT DETECTED NOT DETECTED Final   Streptococcus agalactiae NOT DETECTED NOT DETECTED Final   Streptococcus pneumoniae NOT DETECTED NOT DETECTED Final   Streptococcus pyogenes NOT DETECTED NOT DETECTED Final   Acinetobacter baumannii NOT DETECTED NOT DETECTED Final   Enterobacteriaceae species NOT DETECTED NOT DETECTED Final   Enterobacter cloacae complex NOT DETECTED NOT DETECTED Final   Escherichia coli NOT DETECTED NOT DETECTED Final   Klebsiella oxytoca NOT DETECTED NOT DETECTED Final   Klebsiella pneumoniae NOT DETECTED NOT DETECTED Final   Proteus species NOT DETECTED NOT DETECTED Final   Serratia marcescens NOT DETECTED NOT DETECTED Final   Haemophilus influenzae NOT DETECTED NOT DETECTED Final   Neisseria meningitidis NOT DETECTED NOT DETECTED Final   Pseudomonas aeruginosa NOT DETECTED NOT DETECTED Final   Candida albicans NOT DETECTED NOT DETECTED Final   Candida glabrata NOT DETECTED NOT DETECTED Final   Candida krusei NOT DETECTED NOT DETECTED Final   Candida parapsilosis NOT DETECTED NOT DETECTED Final   Candida tropicalis NOT DETECTED NOT DETECTED Final    Comment: Performed at [facility], [place]. [address] [place],  [address]  MRSA PCR Screening     Status: Abnormal   Collection Time: 05/22/17  9:40 PM  Result Value Ref Range Status   MRSA by PCR POSITIVE (A) NEGATIVE Final    Comment:        The GeneXpert MRSA Assay (FDA approved for NASAL specimens only), is one  component of a comprehensive MRSA colonization surveillance program. It is not intended to diagnose MRSA infection nor to guide or monitor treatment for MRSA infections. RESULT CALLED TO, READ BACK BY AND VERIFIED WITH: MCGIBBONY C. AT [id number] ON [id number] BY Biagio Borg.     Radiology Reports Dg Chest Port 1 View  Result Date: 05/27/2017 CLINICAL DATA:  Central catheter placement EXAM: PORTABLE CHEST 1 VIEW COMPARISON:  May 22, 2017 FINDINGS: Central catheter tip is at the cavoatrial junction. No pneumothorax. There is persistent patchy infiltrate in each lung base consistent with pneumonia. There is slight increase in left base consolidation without change on the right. Heart size is normal. No adenopathy. Pulmonary vascularity is normal. There is aortic atherosclerosis. There is degenerative change in each shoulder. IMPRESSION: Central catheter tip at cavoatrial junction without pneumothorax. Patchy consolidation in both lung bases felt to represent pneumonia. These changes are stable on the right with  slight increase in consolidation in the left base. There is aortic atherosclerosis. Aortic Atherosclerosis (ICD10-I70.0). Electronically Signed   By: Lowella Grip III M.D.   On: 05/27/2017 21:26   Dg Chest Portable 1 View  Result Date: 05/22/2017 CLINICAL DATA:  Hypoxemia, increased mucus secretions, fever this morning, history atrial fibrillation, CHF, hypertension, NSTEMI, former smoker, prior stroke EXAM: PORTABLE CHEST 1 VIEW COMPARISON:  Portable exam 1727 hours compared to 04/04/2017 FINDINGS: Normal heart size and mediastinal contours. Atherosclerotic calcification aorta. Slightly decreased lung volumes versus previous exam with bibasilar opacities, favoring infiltrate/pneumonia at RIGHT base and either atelectasis or infiltrate at LEFT base. Upper lungs clear. No pleural effusion or pneumothorax. Bones demineralized. IMPRESSION: RIGHT basilar infiltrate consistent with pneumonia.  Additional infiltrate versus atelectasis at LEFT base. Electronically Signed   By: Lavonia Dana M.D.   On: 05/22/2017 17:48    Time Spent in minutes  15   Devra Stare M.D on 05/29/2017 at 1:51 PM  Between 7am to 7pm - Pager - [phone]  After 7pm go to [email or web address] - password [facility]  [facility] -  Office  [phone]

## 2017-05-29 NOTE — Progress Notes (Signed)
Daily Progress Note   Patient Name: Lance Kemp       Date: 05/29/2017 DOB: [birth year]  Age: 83 y.o. MRN#: [id number] Attending Physician: Lance Molder, MD Primary Care Physician: Lance Sou, MD Admit Date: 05/22/2017  Reason for Consultation/Follow-up: Establishing goals of care, Inpatient hospice referral and Psychosocial/spiritual support  Subjective: Lance Kemp is resting quietly in bed.  He will make eye contact but only briefly.  He does not verbally answer any of my questions today.  He does shake his head no when I ask if he would like something to drink.  There is no family at bedside at this time.  Tech is at bedside providing physical care, comfort care discussed.  Conference with speech therapy outside room.  Conference with Lance Kemp related to symptom management.  Conference with Education officer, museum and case management related to disposition.  Length of Stay: 7  Current Medications: Scheduled Meds:  . ketotifen  2 drop Both Eyes QHS  . levobunolol  1 drop Both Eyes BID  . sodium chloride flush  3 mL Intravenous Q12H    Continuous Infusions: . sodium chloride      PRN Meds: sodium chloride, acetaminophen, acetaminophen, glycopyrrolate, guaiFENesin, levalbuterol, morphine injection, ondansetron **OR** ondansetron (ZOFRAN) IV, polyethylene glycol, sodium chloride flush  Physical Exam  Constitutional: No distress.  Appears weak and frail,  only briefly makes eye contact, does not try to communicate.    HENT:  Head: Atraumatic.  Temporal wasting  Pulmonary/Chest: Effort normal. No respiratory distress.  Abdominal: Soft. He exhibits no distension.  Musculoskeletal: He exhibits no edema.  Neurological: He is alert.  Does not respond verbally to my questions    Skin: Skin is warm and dry.  Nursing note and vitals reviewed.           Vital Signs: BP (!) 135/98 (BP Location: Left Arm)   Pulse 85   Temp 98.2 F (36.8 C) (Axillary)   Resp 20   Ht 5\' 10"  (1.778 m) Comment: unsure  Wt 59.9 kg (132 lb 0.9 oz)   SpO2 90%   BMI 18.95 kg/m  SpO2: SpO2: 90 % O2 Device: O2 Device: Nasal Cannula O2 Flow Rate: O2 Flow Rate (L/min): 4 L/min  Intake/output summary:   Intake/Output Summary (Last 24 hours) at 05/29/2017 1457 Last data filed at  05/29/2017 0900 Gross per 24 hour  Intake 0 ml  Output 150 ml  Net -150 ml   LBM: Last BM Date: 05/28/17 Baseline Weight: Weight: 70.3 kg (155 lb) Most recent weight: Weight: 59.9 kg (132 lb 0.9 oz)       Palliative Assessment/Data:    Flowsheet Rows     Most Recent Value  Intake Tab  Referral Department  Hospitalist  Unit at Time of Referral  ICU  Palliative Care Primary Diagnosis  Pulmonary  Date Notified  05/22/17  Palliative Care Type  New Palliative care  Reason for referral  Clarify Goals of Care, Psychosocial or Spiritual support  Date of Admission  05/22/17  Date first seen by Palliative Care  05/23/17  # of days Palliative referral response time  1 Day(s)  # of days IP prior to Palliative referral  0  Clinical Assessment  Palliative Performance Scale Score  30%  Pain Max last 24 hours  Not able to report  Pain Min Last 24 hours  Not able to report  Dyspnea Max Last 24 Hours  Not able to report  Dyspnea Min Last 24 hours  Not able to report  Psychosocial & Spiritual Assessment  Palliative Care Outcomes  Patient/Family meeting held?  Yes  Who was at the meeting?  Patient at bedside, call to son, left voicemail.  Patient/Family wishes: Interventions discontinued/not started   Mechanical Ventilation      Patient Active Problem List   Diagnosis Date Noted  . Acute on chronic respiratory failure with hypoxia ([place]) 05/24/2017  . MRSA bacteremia 05/24/2017  . Sepsis due to pneumonia  ([place]) 05/24/2017  . Palliative care encounter   . Goals of care, counseling/discussion   . GERD (gastroesophageal reflux disease) 05/22/2017  . Paroxysmal atrial fibrillation ([place]) 05/22/2017  . Glaucoma 05/22/2017  . Hypernatremia 05/22/2017  . Lactic acidosis 05/22/2017  . AKI (acute kidney injury) ([place]) 05/22/2017  . Acute CVA (cerebrovascular accident) ([place]) 04/16/2017  . Falls frequently 04/16/2017  . Generalized weakness 04/05/2017  . Elevated troponin 02/11/2017  . Vertigo 02/04/2017  . Basal cell carcinoma, face 04/07/2015  . Moderate dementia 11/24/2014  . OAB (overactive bladder) 10/17/2014  . Essential hypertension 08/25/2014  . Hyperlipidemia 08/25/2014  . Peripheral edema 04/16/2014  . Chronic diastolic heart failure ([place]) 03/04/2014  . Chronic renal insufficiency, stage III (moderate) ([facility])   . Chronic shoulder pain 02/19/2014  . SOB (shortness of breath) 02/18/2014  . Lumbago 12/18/2013  . Acute left hemiparesis ([place]) 11/21/2013  . BPH associated with nocturia 11/21/2013  . Short-term memory loss 10/24/2013  . Chronic left shoulder pain 09/14/2013  . Venous insufficiency of both lower extremities 09/14/2013  . HTN (hypertension), benign 09/14/2013  . Normocytic anemia 06/19/2013    Palliative Care Assessment & Plan   Patient Profile: 83 y.o.malewith extensivepast medical history, not limited to, but including, dementia, atrial fibrillation, heart failure, stage III kidney disease, history of TIA and CVA, high blood pressure, depression, history of falling, overactive bladder, arthritis, overactive bladder with incontinence, BPH, basal cell carcinomaadmitted on 12/4/2018with sepsis due to pneumonia.   Assessment: sepsis due to pneumonia: Treated with IV antibiotics Maxipime and vancomycin, now positive blood cultures with Gram Positive Cocci.  Able to maintain his respiratory status.  Family has elected comfort and dignity at end of life, transition to  comfort care, GIP hospice.  Recommendations/Plan:  [facility] inpatient hospice services, GIP due to lack of availability of beds at this time.  Goals of Care  and Additional Recommendations:  Limitations on Scope of Treatment: Full Comfort Care  Code Status:    Code Status Orders  (From admission, onward)        Start     Ordered   05/22/17 2140  Do not attempt resuscitation (DNR)  Continuous    Question Answer Comment  In the event of cardiac or respiratory ARREST Do not call a "code blue"   In the event of cardiac or respiratory ARREST Do not perform Intubation, CPR, defibrillation or ACLS   In the event of cardiac or respiratory ARREST Use medication by any route, position, wound care, and other measures to relive pain and suffering. May use oxygen, suction and manual treatment of airway obstruction as needed for comfort.      05/22/17 2139    Code Status History    Date Active Date Inactive Code Status Order ID Comments User Context   05/22/2017 17:46 05/22/2017 21:39 DNR [id number]  Orlie Dakin, MD ED   04/16/2017 11:24 04/19/2017 20:10 DNR [id number]  Murlean Iba, MD Inpatient   04/16/2017 08:47 04/16/2017 11:24 DNR [id number]  Murlean Iba, MD ED   04/05/2017 05:21 04/06/2017 20:47 DNR [id number]  Oswald Hillock, MD Inpatient   02/11/2017 22:18 02/13/2017 00:39 DNR [id number]  Reubin Milan, MD ED   04/25/2016 22:18 04/27/2016 19:19 DNR [id number]  Rise Patience, MD ED   02/18/2014 17:59 02/20/2014 19:26 Full Code [id number]  Reyne Dumas, MD ED   12/17/2013 22:06 12/20/2013 17:20 DNR [id number]  Berle Mull, MD ED       Prognosis:   < 2 weeks, would not be surprising based on frailty, severity of illness, functional situation, family's desire to focus on comfort and dignity at end of life.  Discharge Planning:  Residential hospice with [facility], GIP due to no available beds.  Care plan was discussed with nursing staff, case  management, social worker, and Lance Kemp.   Thank you for allowing the Palliative Medicine Team to assist in the care of this patient.   Time In: 1420 Time Out: 1450 Total Time 30 minutes Prolonged Time Billed  no       Greater than 50%  of this time was spent counseling and coordinating care related to the above assessment and plan.  Drue Novel, NP  Please contact Palliative Medicine Team phone at [phone] for questions and concerns.

## 2017-05-30 DIAGNOSIS — A419 Sepsis, unspecified organism: Secondary | ICD-10-CM

## 2017-05-30 DIAGNOSIS — I5032 Chronic diastolic (congestive) heart failure: Secondary | ICD-10-CM

## 2017-05-30 DIAGNOSIS — J9621 Acute and chronic respiratory failure with hypoxia: Secondary | ICD-10-CM

## 2017-05-30 DIAGNOSIS — J189 Pneumonia, unspecified organism: Secondary | ICD-10-CM

## 2017-05-30 DIAGNOSIS — R7881 Bacteremia: Secondary | ICD-10-CM

## 2017-05-30 DIAGNOSIS — N179 Acute kidney failure, unspecified: Secondary | ICD-10-CM

## 2017-05-30 MED ORDER — BISACODYL 10 MG RE SUPP: 10.0000 mg | Freq: Every day | RECTAL | 0 refills | Status: AC | PRN

## 2017-05-30 MED ORDER — MORPHINE SULFATE (CONCENTRATE) 10 MG/0.5ML PO SOLN: 2.5000 mg | ORAL | 0 refills | Status: AC | PRN

## 2017-05-30 NOTE — Progress Notes (Signed)
Report given to Hampton Abbot at [facility]. Was took to keep PICC line in per Hospice Home Nurse. Transport via EMS

## 2017-05-30 NOTE — Care Management Important Message (Signed)
Important Message  Patient Details  Name: Lance Kemp MRN: [id number] Date of Birth: 1923/06/13   Medicare Important Message Given:  Yes    Sherald Barge, RN 05/30/2017, 11:59 AM

## 2017-05-30 NOTE — Clinical Social Work Note (Signed)
LCSW sent discharge clinicals to [facility] and arranged transport.  LCSW notified patient's son, Lance Kemp, of discharge.   LCSW signing off.     Lance Kemp, Lance Pugh, LCSW

## 2017-05-30 NOTE — Discharge Summary (Signed)
Physician Discharge Summary  Lance Kemp TMH:[id number] DOB: [birth year] DOA: 05/22/2017  PCP: [name], MD  Admit date: 05/22/2017 Discharge date: 05/30/2017  Admitted From: SNF Disposition:  Residential hospice  Recommendations for Outpatient Follow-up:  1. Patient will be discharged to residential hospice for end of life care  Discharge Condition: hospice CODE STATUS: DNR Diet recommendation: liquids as tolerated for comfort  Brief/Interim Summary: 83 y/o male with History of paroxysmal A. Fib of basal cell carcinoma, diastolic CHF, chronic back pain, CKD stage III, history of CVA and dementia presented from SNF with Fever and shortness of breath. In the ED patient was in acute hypoxic respiratory failure Requiring nonrebreather and severe sepsis with fever, tachycardia, tachypnea, Hypotension, Elevated lactic acid, mildly elevated INR, hypernatremia and acute kidney injury. Chest x-ray showed right basilar infiltrate. Patient admitted to stepdown unit for severe sepsis with acute hypoxic respiratory failure secondary to right lobar pneumonia. Blood culture admission growing MRSA bacteremia.    Discharge Diagnoses:  Active Problems:   HTN (hypertension), benign   BPH associated with nocturia   Chronic renal insufficiency, stage III (moderate) (HCC)   Chronic diastolic heart failure (HCC)   Essential hypertension   Moderate dementia   Elevated troponin   Generalized weakness   GERD (gastroesophageal reflux disease)   Glaucoma   Hypernatremia   AKI (acute kidney injury) ([place])   Palliative care encounter   Goals of care, counseling/discussion   Acute on chronic respiratory failure with hypoxia ([place])   MRSA bacteremia   Sepsis due to pneumonia ([place])  Sepsis due to pneumonia ([place]) Associated with MRSA bacteremia. Goal for comfort and awaiting residential hospice.  Dr. Clementeen Graham had a detailed discussion with the son who agreed with comfort measures. Discontinued IV  antibiotics. Comfort feeds as tolerated. No further lab draws or tests.  When necessary IV morphine for comfort and  shortness of breath. Ativan when necessary for agitation/anxiety.  Active Problems: ? Aspiration pneumonia Comfort feed if tolerated.  Dementia with acute metabolic encephalopathy Remains confused and restless.  Hypotension Off home blood pressure medications.   Glaucoma Continue home medications.  Hyponatremia Secondary to dehydration Received IV fluids Initially, discontinued with congestion on exam.  Acute kidney injury on chronic kidney disease stage III Secondary to sepsis and dehydration.  Goals of care Guarded prognosis. He has been accepted to residential hospice. Discussed in detail with son on the phone who agrees with the plan.    Discharge Instructions  Discharge Instructions    Diet - low sodium heart healthy   Complete by:  As directed    Increase activity slowly   Complete by:  As directed      Allergies as of 05/30/2017      Reactions   Altace [ramipril] Other (See Comments)   Reaction:  Unknown    Amoxicillin Other (See Comments)   Reaction:  Unknown  Has patient had a PCN reaction causing immediate rash, facial/tongue/throat swelling, SOB or lightheadedness with hypotension: Unsure Has patient had a PCN reaction causing severe rash involving mucus membranes or skin necrosis: Unsure Has patient had a PCN reaction that required hospitalization Unsure Has patient had a PCN reaction occurring within the last 10 years: Unsure If all of the above answers are "NO", then may proceed with Cephalosporin use.   Cozaar [losartan Potassium] Other (See Comments)   Reaction:  Unknown    Tramadol Other (See Comments)   Makes patient feel very drowsy/almost too strong to take  Medication List    STOP taking these medications   aspirin 81 MG chewable tablet   feeding supplement (ENSURE ENLIVE) Liqd   finasteride 5 MG  tablet Commonly known as:  PROSCAR   ICAPS AREDS 2 PO   omeprazole 40 MG capsule Commonly known as:  PRILOSEC   polyethylene glycol powder powder Commonly known as:  GLYCOLAX/MIRALAX     TAKE these medications   acetaminophen 650 MG suppository Commonly known as:  TYLENOL Place 650 mg rectally every 6 (six) hours as needed for fever.   acetaminophen 500 MG tablet Commonly known as:  TYLENOL Take 500 mg by mouth every 4 (four) hours as needed for mild pain or moderate pain.   albuterol (2.5 MG/3ML) 0.083% nebulizer solution Commonly known as:  PROVENTIL Take 2.5 mg by nebulization every 4 (four) hours as needed for wheezing or shortness of breath.   azelastine 0.05 % ophthalmic solution Commonly known as:  OPTIVAR Place 2 drops into both eyes every evening.   bisacodyl 10 MG suppository Commonly known as:  DULCOLAX Place 1 suppository (10 mg total) rectally daily as needed for moderate constipation.   GERI-TUSSIN 100 MG/5ML syrup Generic drug:  guaifenesin Take 200 mg by mouth every 4 (four) hours as needed for cough.   levobunolol 0.5 % ophthalmic solution Commonly known as:  BETAGAN Place 1 drop into both eyes 2 (two) times daily.   morphine CONCENTRATE 10 MG/0.5ML Soln concentrated solution Take 0.13 mLs (2.6 mg total) by mouth every 4 (four) hours as needed for moderate pain (SOB).   ondansetron 4 MG tablet Commonly known as:  ZOFRAN Take 1 tablet (4 mg total) by mouth 4 (four) times daily -  before meals and at bedtime.       Allergies  Allergen Reactions  . Altace [Ramipril] Other (See Comments)    Reaction:  Unknown   . Amoxicillin Other (See Comments)    Reaction:  Unknown  Has patient had a PCN reaction causing immediate rash, facial/tongue/throat swelling, SOB or lightheadedness with hypotension: Unsure Has patient had a PCN reaction causing severe rash involving mucus membranes or skin necrosis: Unsure Has patient had a PCN reaction that required  hospitalization Unsure Has patient had a PCN reaction occurring within the last 10 years: Unsure If all of the above answers are "NO", then may proceed with Cephalosporin use.  [name] Potassium] Other (See Comments)    Reaction:  Unknown   . Tramadol Other (See Comments)    Makes patient feel very drowsy/almost too strong to take    Consultations:  Palliative care   Procedures/Studies: Dg Chest Port 1 View  Result Date: 05/27/2017 CLINICAL DATA:  Central catheter placement EXAM: PORTABLE CHEST 1 VIEW COMPARISON:  May 22, 2017 FINDINGS: Central catheter tip is at the cavoatrial junction. No pneumothorax. There is persistent patchy infiltrate in each lung base consistent with pneumonia. There is slight increase in left base consolidation without change on the right. Heart size is normal. No adenopathy. Pulmonary vascularity is normal. There is aortic atherosclerosis. There is degenerative change in each shoulder. IMPRESSION: Central catheter tip at cavoatrial junction without pneumothorax. Patchy consolidation in both lung bases felt to represent pneumonia. These changes are stable on the right with slight increase in consolidation in the left base. There is aortic atherosclerosis. Aortic Atherosclerosis (ICD10-I70.0). Electronically Signed   By: Lowella Grip III M.D.   On: 05/27/2017 21:26   Dg Chest Portable 1 View  Result Date: 05/22/2017 CLINICAL DATA:  Hypoxemia, increased mucus secretions, fever this morning, history atrial fibrillation, CHF, hypertension, NSTEMI, former smoker, prior stroke EXAM: PORTABLE CHEST 1 VIEW COMPARISON:  Portable exam 1727 hours compared to 04/04/2017 FINDINGS: Normal heart size and mediastinal contours. Atherosclerotic calcification aorta. Slightly decreased lung volumes versus previous exam with bibasilar opacities, favoring infiltrate/pneumonia at RIGHT base and either atelectasis or infiltrate at LEFT base. Upper lungs clear. No pleural  effusion or pneumothorax. Bones demineralized. IMPRESSION: RIGHT basilar infiltrate consistent with pneumonia. Additional infiltrate versus atelectasis at LEFT base. Electronically Signed   By: Lavonia Dana M.D.   On: 05/22/2017 17:48       Subjective: Does not answer questions or follow commands  Discharge Exam: Vitals:   05/29/17 0627 05/29/17 1700  BP: (!) 135/98 132/87  Pulse: 85 78  Resp: 20 20  Temp: 98.2 F (36.8 C) 98.3 F (36.8 C)  SpO2: 90% 96%   Vitals:   05/28/17 1427 05/28/17 2042 05/29/17 0627 05/29/17 1700  BP: 91/64 97/69 (!) 135/98 132/87  Pulse:  94 85 78  Resp: 20 20 20 20   Temp: 97.8 F (36.6 C) 98.6 F (37 C) 98.2 F (36.8 C) 98.3 F (36.8 C)  TempSrc: Axillary Axillary Axillary   SpO2: 96% 91% 90% 96%  Weight:      Height:        General: Pt is alert, awake, does not respond to commands Cardiovascular: RRR, S1/S2 +, no rubs, no gallops Respiratory: CTA bilaterally, no wheezing, no rhonchi Abdominal: Soft, NT, ND, bowel sounds + Extremities: no edema, no cyanosis    The results of significant diagnostics from this hospitalization (including imaging, microbiology, ancillary and laboratory) are listed below for reference.     Microbiology: Recent Results (from the past 240 hour(s))  Culture, blood (Routine x 2)     Status: None   Collection Time: 05/22/17  5:26 PM  Result Value Ref Range Status   Specimen Description BLOOD RIGHT HAND  Final   Special Requests   Final    BOTTLES DRAWN AEROBIC AND ANAEROBIC Blood Culture adequate volume   Culture NO GROWTH 5 DAYS  Final   Report Status 05/27/2017 FINAL  Final  Culture, blood (Routine x 2)     Status: Abnormal   Collection Time: 05/22/17  5:26 PM  Result Value Ref Range Status   Specimen Description LEFT ANTECUBITAL  Final   Special Requests   Final    BOTTLES DRAWN AEROBIC AND ANAEROBIC Blood Culture adequate volume   Culture  Setup Time   Final    GRAM POSITIVE COCCI AEROBIC BOTTLE  ONLY Gram Stain Report Called to,Read Back By and Verified With: CAPMPOS,E AT [place] ON 12.5.2018 BY ISLEY,B Performed at [place], READ BACK BY AND VERIFIED WITH: Clarene Essex RN [id number] 05/24/17 A BROWNING    Culture (A)  Final    METHICILLIN RESISTANT STAPHYLOCOCCUS AUREUS STAPHYLOCOCCUS SPECIES (COAGULASE NEGATIVE) THE SIGNIFICANCE OF ISOLATING THIS ORGANISM FROM A SINGLE SET OF BLOOD CULTURES WHEN MULTIPLE SETS ARE DRAWN IS UNCERTAIN. PLEASE NOTIFY THE MICROBIOLOGY DEPARTMENT WITHIN ONE WEEK IF SPECIATION AND SENSITIVITIES ARE REQUIRED. Performed at [facility], [place] [address], [place], [place] [address]    Report Status 05/28/2017 FINAL  Final   Organism ID, Bacteria METHICILLIN RESISTANT STAPHYLOCOCCUS AUREUS  Final      Susceptibility   Methicillin resistant staphylococcus aureus - MIC*    CIPROFLOXACIN >=8 RESISTANT Resistant     ERYTHROMYCIN <=0.25 SENSITIVE Sensitive  GENTAMICIN <=0.5 SENSITIVE Sensitive     OXACILLIN >=4 RESISTANT Resistant     TETRACYCLINE <=1 SENSITIVE Sensitive     VANCOMYCIN 1 SENSITIVE Sensitive     TRIMETH/SULFA >=320 RESISTANT Resistant     CLINDAMYCIN <=0.25 SENSITIVE Sensitive     RIFAMPIN <=0.5 SENSITIVE Sensitive     Inducible Clindamycin NEGATIVE Sensitive     * METHICILLIN RESISTANT STAPHYLOCOCCUS AUREUS  Blood Culture ID Panel (Reflexed)     Status: Abnormal   Collection Time: 05/22/17  5:26 PM  Result Value Ref Range Status   Enterococcus species NOT DETECTED NOT DETECTED Final   Listeria monocytogenes NOT DETECTED NOT DETECTED Final   Staphylococcus species DETECTED (A) NOT DETECTED Final    Comment: CRITICAL RESULT CALLED TO, READ BACK BY AND VERIFIED WITH: Clarene Essex RN [phone] 05/24/17 A BROWNING    Staphylococcus aureus DETECTED (A) NOT DETECTED Final    Comment: Methicillin (oxacillin)-resistant Staphylococcus aureus (MRSA). MRSA is predictably resistant to beta-lactam antibiotics (except  ceftaroline). Preferred therapy is vancomycin unless clinically contraindicated. Patient requires contact precautions if  hospitalized. CRITICAL RESULT CALLED TO, READ BACK BY AND VERIFIED WITH: Clarene Essex RN [id number] 05/24/17 A BROWNING    Methicillin resistance DETECTED (A) NOT DETECTED Final    Comment: CRITICAL RESULT CALLED TO, READ BACK BY AND VERIFIED WITH: Clarene Essex RN [phone] 05/24/17 A BROWNING    Streptococcus species NOT DETECTED NOT DETECTED Final   Streptococcus agalactiae NOT DETECTED NOT DETECTED Final   Streptococcus pneumoniae NOT DETECTED NOT DETECTED Final   Streptococcus pyogenes NOT DETECTED NOT DETECTED Final   Acinetobacter baumannii NOT DETECTED NOT DETECTED Final   Enterobacteriaceae species NOT DETECTED NOT DETECTED Final   Enterobacter cloacae complex NOT DETECTED NOT DETECTED Final   Escherichia coli NOT DETECTED NOT DETECTED Final   Klebsiella oxytoca NOT DETECTED NOT DETECTED Final   Klebsiella pneumoniae NOT DETECTED NOT DETECTED Final   Proteus species NOT DETECTED NOT DETECTED Final   Serratia marcescens NOT DETECTED NOT DETECTED Final   Haemophilus influenzae NOT DETECTED NOT DETECTED Final   Neisseria meningitidis NOT DETECTED NOT DETECTED Final   Pseudomonas aeruginosa NOT DETECTED NOT DETECTED Final   Candida albicans NOT DETECTED NOT DETECTED Final   Candida glabrata NOT DETECTED NOT DETECTED Final   Candida krusei NOT DETECTED NOT DETECTED Final   Candida parapsilosis NOT DETECTED NOT DETECTED Final   Candida tropicalis NOT DETECTED NOT DETECTED Final    Comment: Performed at [facility], [place]. [address], [place], [place] [address]  MRSA PCR Screening     Status: Abnormal   Collection Time: 05/22/17  9:40 PM  Result Value Ref Range Status   MRSA by PCR POSITIVE (A) NEGATIVE Final    Comment:        The GeneXpert MRSA Assay (FDA approved for NASAL specimens only), is one component of a comprehensive MRSA colonization surveillance program. It  is not intended to diagnose MRSA infection nor to guide or monitor treatment for MRSA infections. RESULT CALLED TO, READ BACK BY AND VERIFIED WITH: MCGIBBONY C. AT 0932A ON [id number] BY THOMPSON S.      Labs: BNP (last 3 results) Recent Labs    02/11/17 1933  BNP 643.3*   Basic Metabolic Panel: Recent Labs  Lab 05/24/17 0536 05/25/17 1153 05/26/17 0656 05/29/17 0525  NA 147* 148* 151* 160*  K 4.1 4.0 3.7 3.6  CL 114* 117* 119* 126*  CO2 27 27 25  21*  GLUCOSE 141* 97 83 109*  BUN 42* 46* 40* 57*  CREATININE 1.34* 1.13 1.06 3.02*  CALCIUM 8.1* 8.4* 8.2* 8.1*   Liver Function Tests: No results for input(s): AST, ALT, ALKPHOS, BILITOT, PROT, ALBUMIN in the last 168 hours. No results for input(s): LIPASE, AMYLASE in the last 168 hours. No results for input(s): AMMONIA in the last 168 hours. CBC: Recent Labs  Lab 05/24/17 0536 05/25/17 1153 05/26/17 0656  WBC 11.7* 8.5 9.9  NEUTROABS 10.4* 7.4 7.8*  HGB 9.7* 10.4* 11.2*  HCT 30.1* 31.3* 35.6*  MCV 95.0 96.0 95.7  PLT 181 153 179   Cardiac Enzymes: No results for input(s): CKTOTAL, CKMB, CKMBINDEX, TROPONINI in the last 168 hours. BNP: Invalid input(s): POCBNP CBG: No results for input(s): GLUCAP in the last 168 hours. D-Dimer No results for input(s): DDIMER in the last 72 hours. Hgb A1c No results for input(s): HGBA1C in the last 72 hours. Lipid Profile No results for input(s): CHOL, HDL, LDLCALC, TRIG, CHOLHDL, LDLDIRECT in the last 72 hours. Thyroid function studies No results for input(s): TSH, T4TOTAL, T3FREE, THYROIDAB in the last 72 hours.  Invalid input(s): FREET3 Anemia work up No results for input(s): VITAMINB12, FOLATE, FERRITIN, TIBC, IRON, RETICCTPCT in the last 72 hours. Urinalysis    Component Value Date/Time   COLORURINE YELLOW 05/24/2017 0000   APPEARANCEUR HAZY (A) 05/24/2017 0000   LABSPEC 1.024 05/24/2017 0000   PHURINE 5.0 05/24/2017 0000   GLUCOSEU NEGATIVE 05/24/2017 0000    HGBUR NEGATIVE 05/24/2017 0000   BILIRUBINUR NEGATIVE 05/24/2017 0000   BILIRUBINUR neg 09/24/2015 1504   KETONESUR NEGATIVE 05/24/2017 0000   PROTEINUR NEGATIVE 05/24/2017 0000   UROBILINOGEN 0.2 09/24/2015 1504   UROBILINOGEN 0.2 09/10/2014 1052   NITRITE NEGATIVE 05/24/2017 0000   LEUKOCYTESUR NEGATIVE 05/24/2017 0000   Sepsis Labs Invalid input(s): PROCALCITONIN,  WBC,  LACTICIDVEN Microbiology Recent Results (from the past 240 hour(s))  Culture, blood (Routine x 2)     Status: None   Collection Time: 05/22/17  5:26 PM  Result Value Ref Range Status   Specimen Description BLOOD RIGHT HAND  Final   Special Requests   Final    BOTTLES DRAWN AEROBIC AND ANAEROBIC Blood Culture adequate volume   Culture NO GROWTH 5 DAYS  Final   Report Status 05/27/2017 FINAL  Final  Culture, blood (Routine x 2)     Status: Abnormal   Collection Time: 05/22/17  5:26 PM  Result Value Ref Range Status   Specimen Description LEFT ANTECUBITAL  Final   Special Requests   Final    BOTTLES DRAWN AEROBIC AND ANAEROBIC Blood Culture adequate volume   Culture  Setup Time   Final    GRAM POSITIVE COCCI AEROBIC BOTTLE ONLY Gram Stain Report Called to,Read Back By and Verified With: CAPMPOS,E AT [place] ON 12.5.2018 BY ISLEY,B Performed at [place], READ BACK BY AND VERIFIED WITH: Clarene Essex RN [phone] 05/24/17 A BROWNING    Culture (A)  Final    METHICILLIN RESISTANT STAPHYLOCOCCUS AUREUS STAPHYLOCOCCUS SPECIES (COAGULASE NEGATIVE) THE SIGNIFICANCE OF ISOLATING THIS ORGANISM FROM A SINGLE SET OF BLOOD CULTURES WHEN MULTIPLE SETS ARE DRAWN IS UNCERTAIN. PLEASE NOTIFY THE MICROBIOLOGY DEPARTMENT WITHIN ONE WEEK IF SPECIATION AND SENSITIVITIES ARE REQUIRED. Performed at [facility], [place] [address] [place], [place] [address]    Report Status 05/28/2017 FINAL  Final   Organism ID, Bacteria METHICILLIN RESISTANT STAPHYLOCOCCUS AUREUS  Final      Susceptibility    Methicillin resistant staphylococcus aureus -  MIC*    CIPROFLOXACIN >=8 RESISTANT Resistant     ERYTHROMYCIN <=0.25 SENSITIVE Sensitive     GENTAMICIN <=0.5 SENSITIVE Sensitive     OXACILLIN >=4 RESISTANT Resistant     TETRACYCLINE <=1 SENSITIVE Sensitive     VANCOMYCIN 1 SENSITIVE Sensitive     TRIMETH/SULFA >=320 RESISTANT Resistant     CLINDAMYCIN <=0.25 SENSITIVE Sensitive     RIFAMPIN <=0.5 SENSITIVE Sensitive     Inducible Clindamycin NEGATIVE Sensitive     * METHICILLIN RESISTANT STAPHYLOCOCCUS AUREUS  Blood Culture ID Panel (Reflexed)     Status: Abnormal   Collection Time: 05/22/17  5:26 PM  Result Value Ref Range Status   Enterococcus species NOT DETECTED NOT DETECTED Final   Listeria monocytogenes NOT DETECTED NOT DETECTED Final   Staphylococcus species DETECTED (A) NOT DETECTED Final    Comment: CRITICAL RESULT CALLED TO, READ BACK BY AND VERIFIED WITH: Clarene Essex RN [phone] 05/24/17 A BROWNING    Staphylococcus aureus DETECTED (A) NOT DETECTED Final    Comment: Methicillin (oxacillin)-resistant Staphylococcus aureus (MRSA). MRSA is predictably resistant to beta-lactam antibiotics (except ceftaroline). Preferred therapy is vancomycin unless clinically contraindicated. Patient requires contact precautions if  hospitalized. CRITICAL RESULT CALLED TO, READ BACK BY AND VERIFIED WITH: Clarene Essex RN [phone] 05/24/17 A BROWNING    Methicillin resistance DETECTED (A) NOT DETECTED Final    Comment: CRITICAL RESULT CALLED TO, READ BACK BY AND VERIFIED WITH: Clarene Essex RN [phone] 05/24/17 A BROWNING    Streptococcus species NOT DETECTED NOT DETECTED Final   Streptococcus agalactiae NOT DETECTED NOT DETECTED Final   Streptococcus pneumoniae NOT DETECTED NOT DETECTED Final   Streptococcus pyogenes NOT DETECTED NOT DETECTED Final   Acinetobacter baumannii NOT DETECTED NOT DETECTED Final   Enterobacteriaceae species NOT DETECTED NOT DETECTED Final   Enterobacter cloacae complex NOT DETECTED NOT  DETECTED Final   Escherichia coli NOT DETECTED NOT DETECTED Final   Klebsiella oxytoca NOT DETECTED NOT DETECTED Final   Klebsiella pneumoniae NOT DETECTED NOT DETECTED Final   Proteus species NOT DETECTED NOT DETECTED Final   Serratia marcescens NOT DETECTED NOT DETECTED Final   Haemophilus influenzae NOT DETECTED NOT DETECTED Final   Neisseria meningitidis NOT DETECTED NOT DETECTED Final   Pseudomonas aeruginosa NOT DETECTED NOT DETECTED Final   Candida albicans NOT DETECTED NOT DETECTED Final   Candida glabrata NOT DETECTED NOT DETECTED Final   Candida krusei NOT DETECTED NOT DETECTED Final   Candida parapsilosis NOT DETECTED NOT DETECTED Final   Candida tropicalis NOT DETECTED NOT DETECTED Final    Comment: Performed at [facility], [place]. [address], [place], [place] [address]  MRSA PCR Screening     Status: Abnormal   Collection Time: 05/22/17  9:40 PM  Result Value Ref Range Status   MRSA by PCR POSITIVE (A) NEGATIVE Final    Comment:        The GeneXpert MRSA Assay (FDA approved for NASAL specimens only), is one component of a comprehensive MRSA colonization surveillance program. It is not intended to diagnose MRSA infection nor to guide or monitor treatment for MRSA infections. RESULT CALLED TO, READ BACK BY AND VERIFIED WITH: MCGIBBONY C. AT 0932A ON [id number] BY THOMPSON S.      Time coordinating discharge: Over 30 minutes  SIGNED:   Kathie Dike, MD  [facility] 05/30/2017, 2:17 PM Pager   If 7PM-7AM, please contact night-coverage [email or web address] Password [email or web address]

## 2017-05-30 NOTE — Clinical Social Work Note (Signed)
Cassandra at [facility] notified that they have a bed at the facility for patient. Cassandra advised that admission paperwork is being faxed to family for signature. Cassandra requested that transport not occur until she receives the completed paperwork back from family.   Attending notified of bed and to discontinue all non comfort medications.     Semiah Konczal, Clydene Pugh, LCSW

## 2017-06-19 DEATH — deceased

## 2017-08-31 ENCOUNTER — Ambulatory Visit: Payer: Medicare Other | Admitting: Neurology

## 2018-05-08 ENCOUNTER — Encounter: Payer: Self-pay | Admitting: *Deleted

## 2018-10-25 IMAGING — DX DG CHEST 1V PORT
1 series · 1 of 1 positions shown · non-contrast
Comparison: 04/19/2016

CLINICAL DATA: Pt experiencing SOB x8day. Former smoker. Hx of HTN
and cancer.

EXAM:
PORTABLE CHEST 1 VIEW

[chest ap]
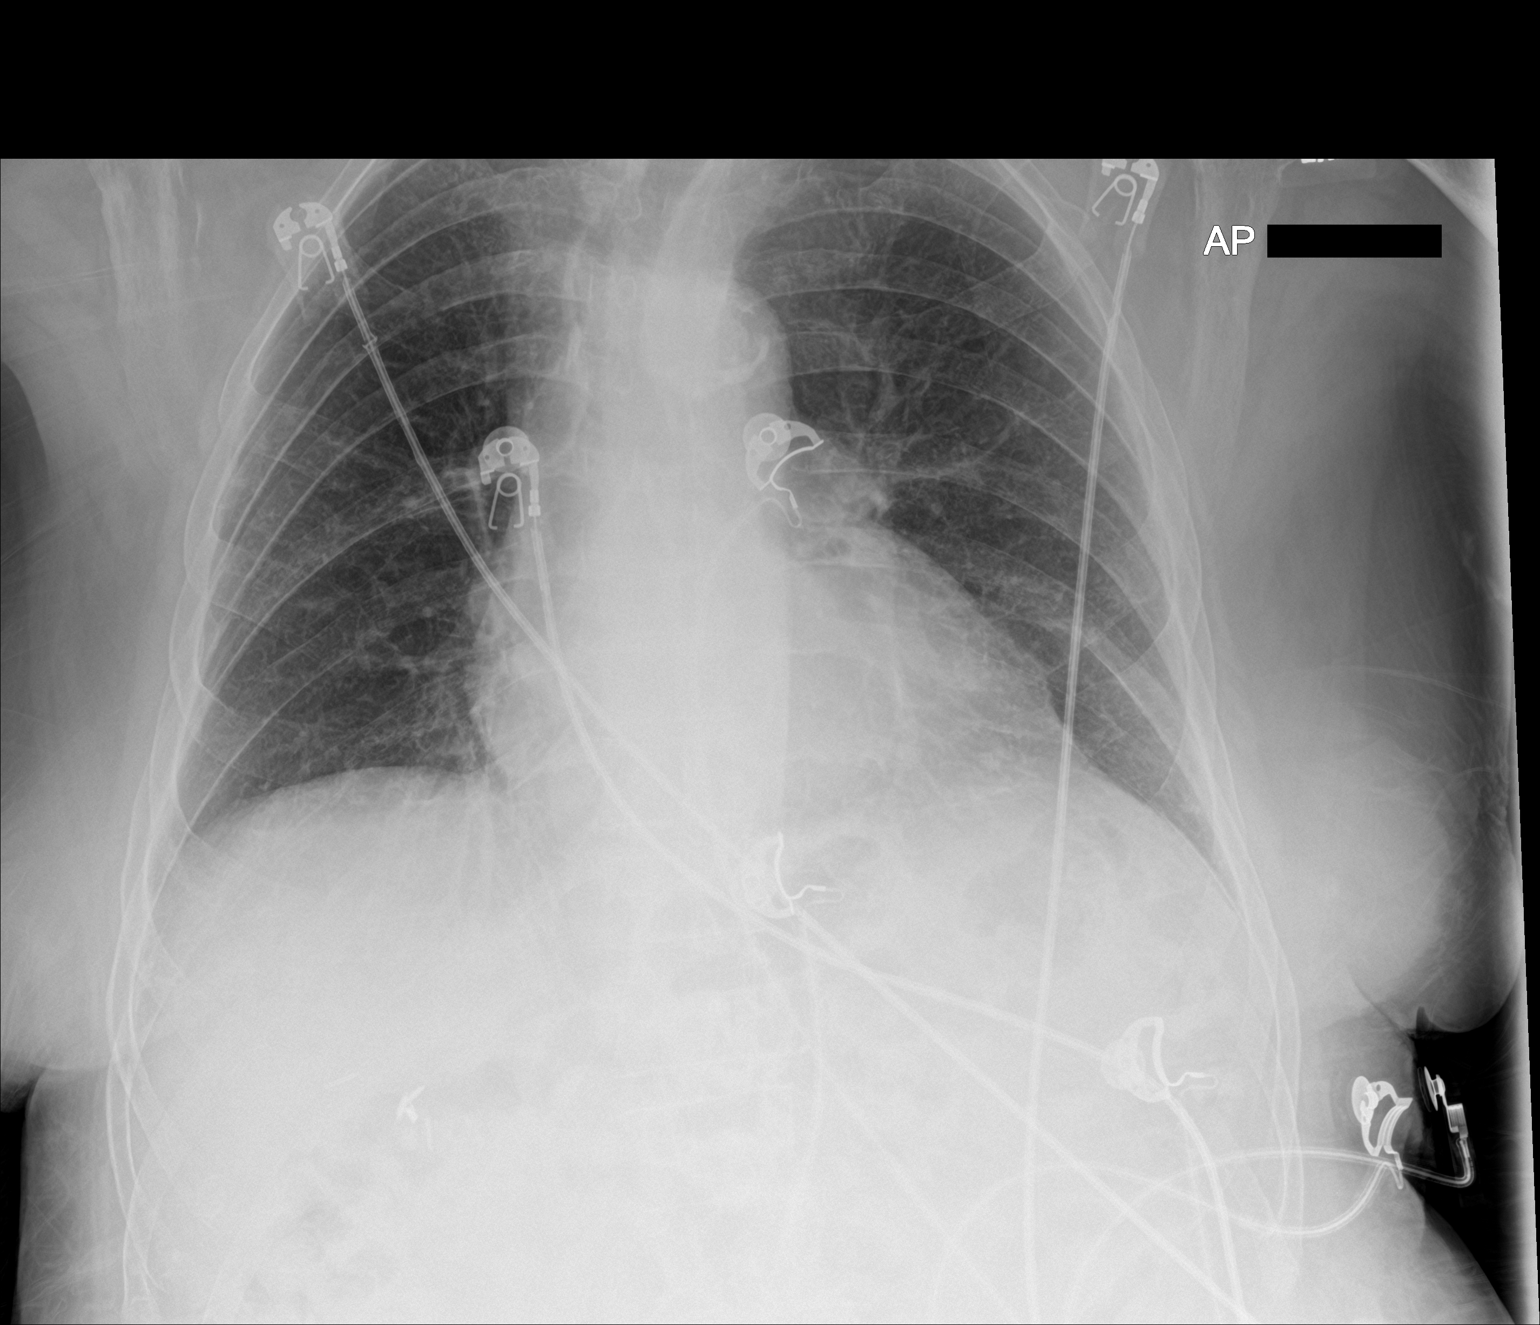

[1 of 1 positions shown; findings below may reference images not displayed]

FINDINGS: The heart size and mediastinal contours are within normal limits.
Both lungs are clear. No pleural effusion or pneumothorax. The
visualized skeletal structures are intact.
IMPRESSION: No active disease.

## 2018-10-25 IMAGING — CT CT HEAD W/O CM
3 series · 15 of 47 positions shown, 18 images · non-contrast
Comparison: 12/21/2014.

CLINICAL DATA: Right temporal headache starting at 7 a.m..

EXAM:
CT HEAD WITHOUT CONTRAST
TECHNIQUE: Contiguous axial images were obtained from the base of the skull
through the vertex without intravenous contrast.

[Series 2: head trauma wo · axial · 0.44mm/px · z∈[+73,+198]mm · 9 of 30 slices shown, 12 images]
[im 3/30  brain]
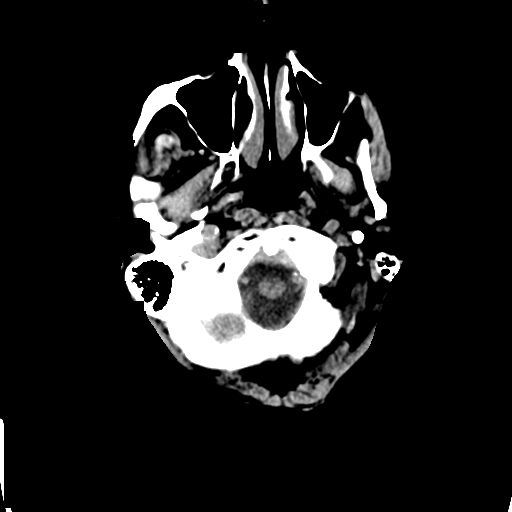
[im 3/30  bone]
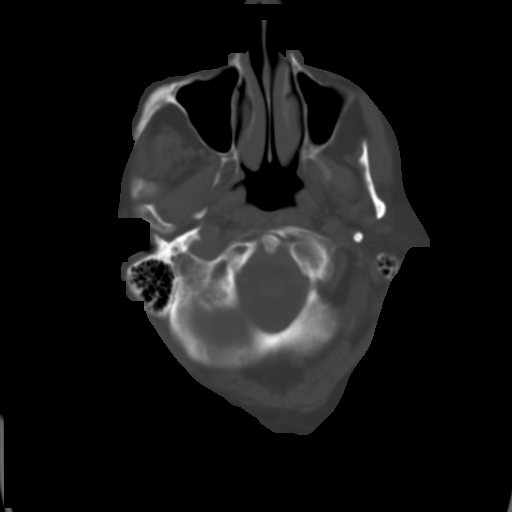
[im 6/30  brain]
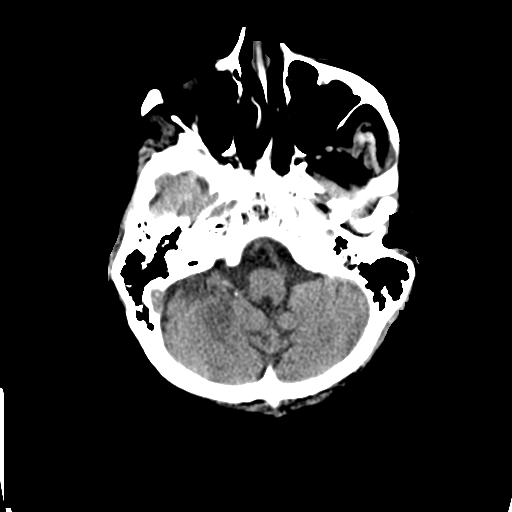
[im 9/30  brain]
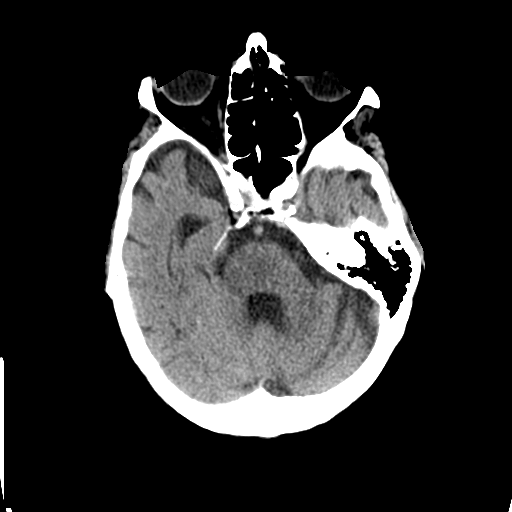
[im 12/30  brain]
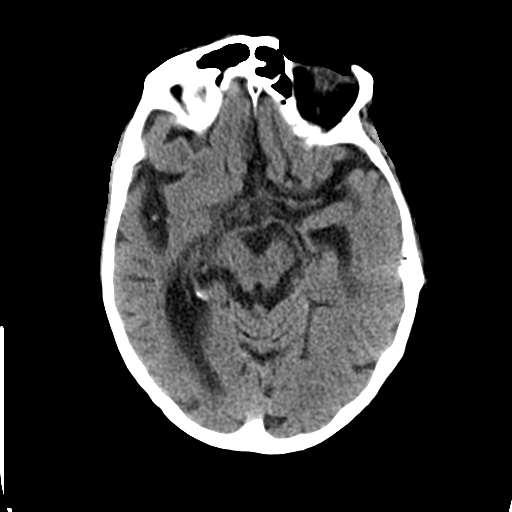
[im 16/30  brain]
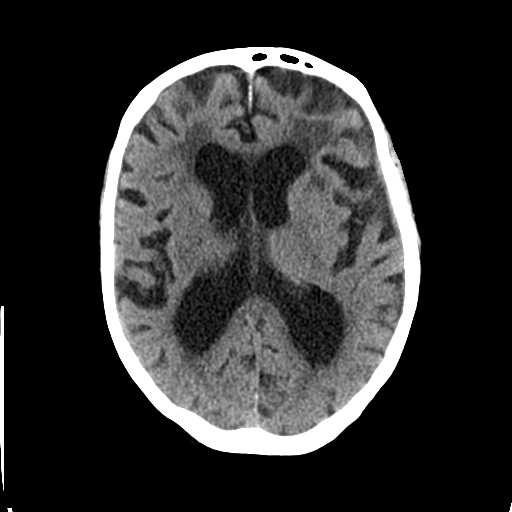
[im 16/30  bone]
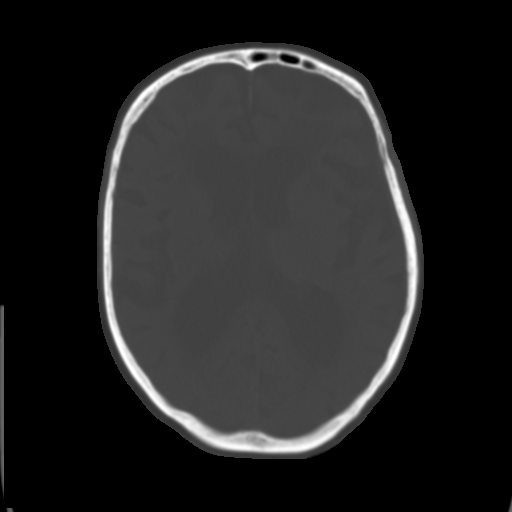
[im 19/30  brain]
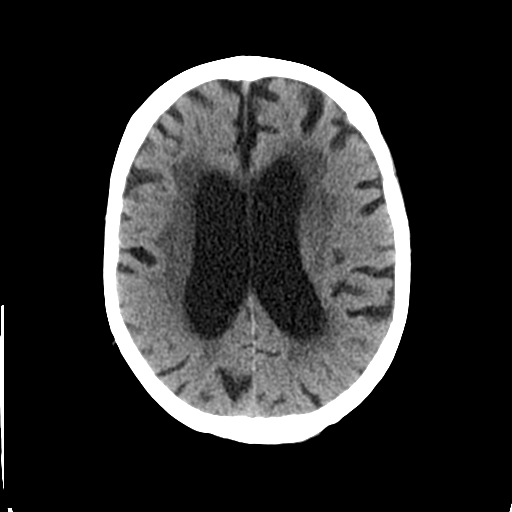
[im 22/30  brain]
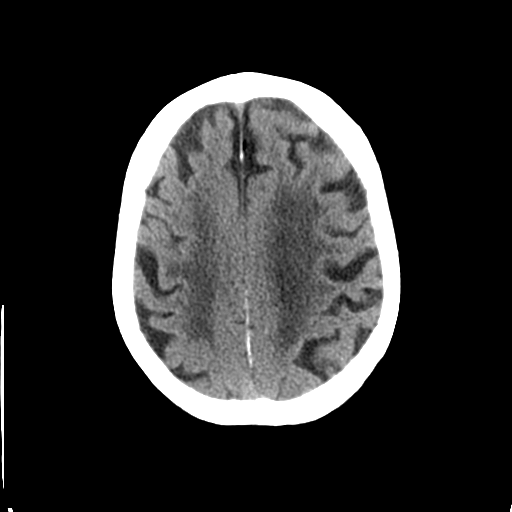
[im 25/30  brain]
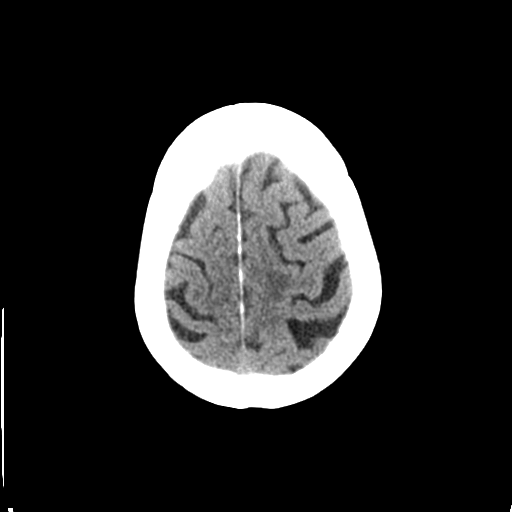
[im 28/30  brain]
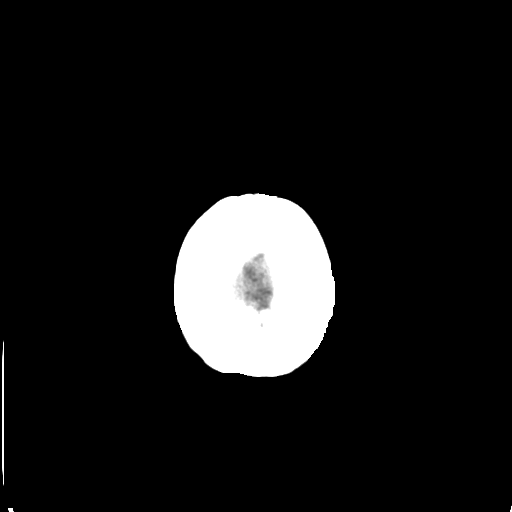
[im 28/30  bone]
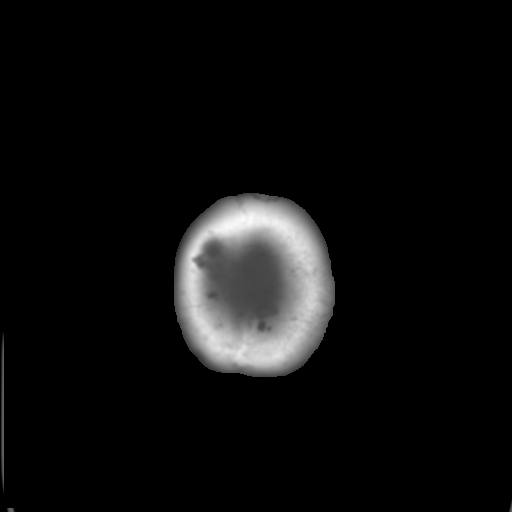

[Series 4: coronal soft tissue · coronal · 0.29mm/px · 3 of 59 slices shown]
[im 20/59  brain]
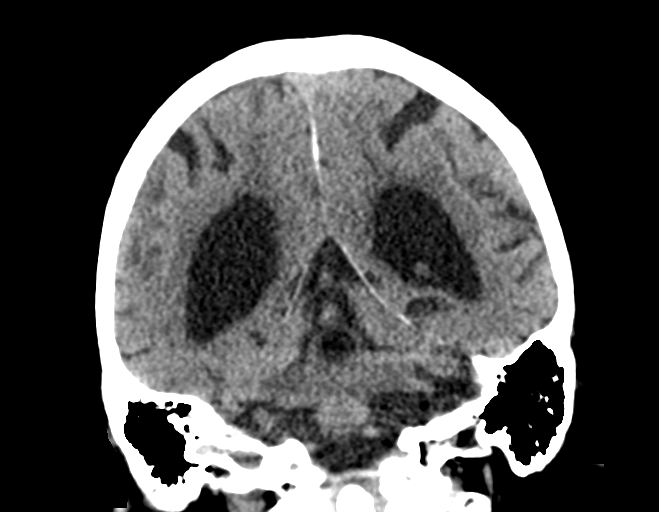
[im 26/59  brain]
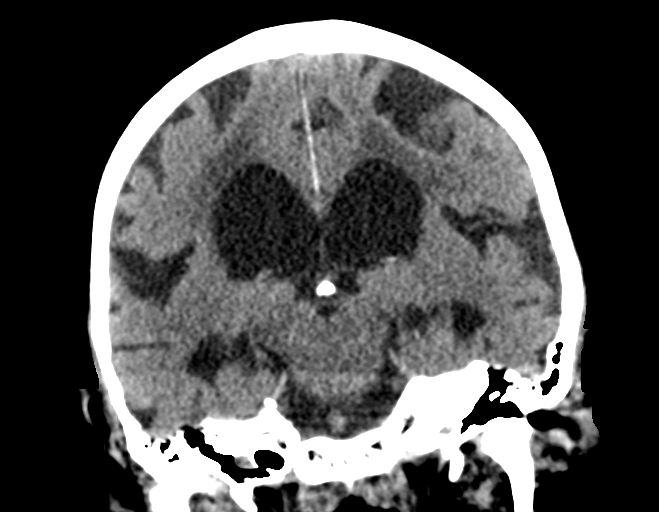
[im 33/59  brain]
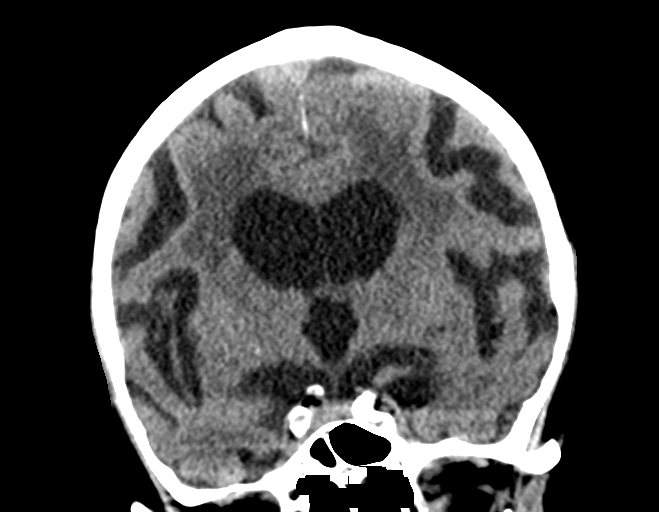

[Series 5: sagittal soft tissue · sagittal · 0.34mm/px · 3 of 44 slices shown]
[im 15/44  brain]
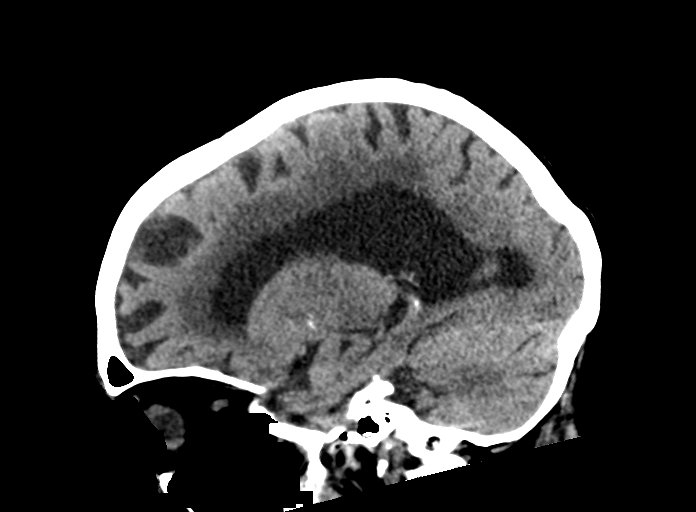
[im 22/44  brain]
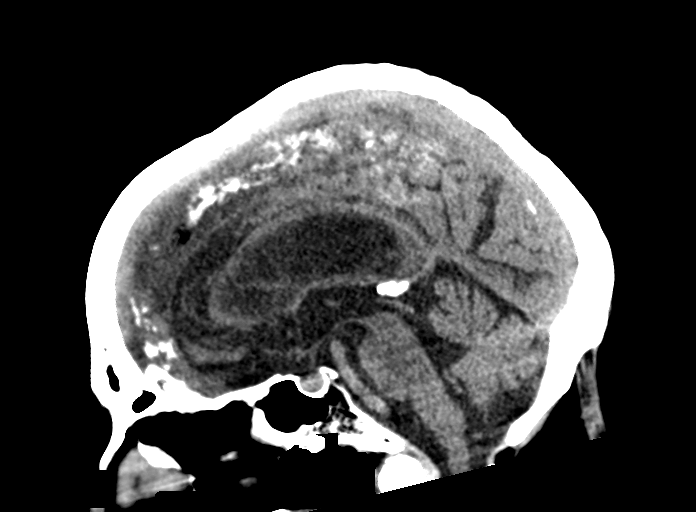
[im 29/44  brain]
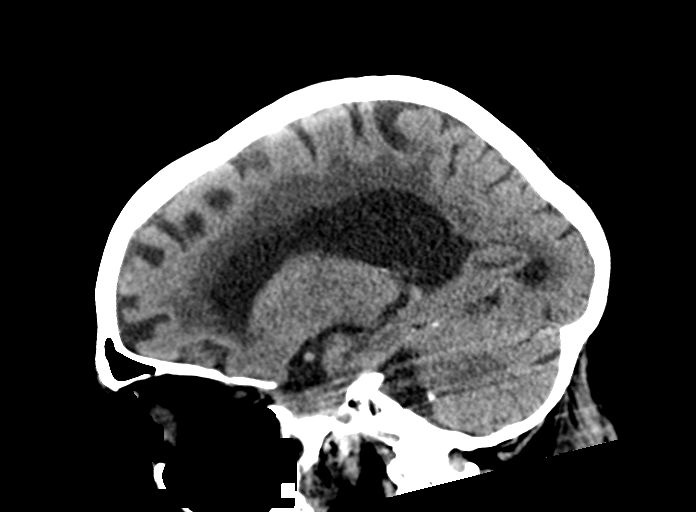

[15 of 47 positions shown; findings below may reference images not displayed]

FINDINGS: Brain: No evidence of acute infarction, hemorrhage, hydrocephalus,
extra-axial collection or mass lesion/mass effect. There is advanced
generalized atrophy with ventriculomegaly. Chronic small vessel
ischemia with confluent periventricular low-density.

Vascular: Atherosclerotic calcification.  No hyperdense vessel.

Skull: No acute or aggressive finding.

Sinuses/Orbits: Cataract resection.  No acute finding.

Other: There is a skin lesion along the anterior right temporal
scalp measuring 11 mm in diameter and 5 mm in depth.
IMPRESSION: 1. No acute finding.
2. Advanced atrophy and chronic small vessel ischemia.
3. Nonspecific skin lesion along the anterior right temporal fossa
that is new or progressed from 5286. Please correlate with skin
exam.
